# Patient Record
Sex: Male | Born: 1950 | Race: White | Hispanic: No | Marital: Married | Smoking: Never smoker
Health system: Southern US, Community
[De-identification: ages and names within clinical notes are randomized; demographics above are authoritative.]

## PROBLEM LIST (undated history)

## (undated) DIAGNOSIS — I272 Pulmonary hypertension, unspecified: Secondary | ICD-10-CM

## (undated) DIAGNOSIS — M25511 Pain in right shoulder: Secondary | ICD-10-CM

## (undated) DIAGNOSIS — I509 Heart failure, unspecified: Secondary | ICD-10-CM

## (undated) DIAGNOSIS — G4730 Sleep apnea, unspecified: Secondary | ICD-10-CM

## (undated) DIAGNOSIS — M179 Osteoarthritis of knee, unspecified: Secondary | ICD-10-CM

## (undated) DIAGNOSIS — E119 Type 2 diabetes mellitus without complications: Secondary | ICD-10-CM

## (undated) DIAGNOSIS — Z9884 Bariatric surgery status: Secondary | ICD-10-CM

## (undated) DIAGNOSIS — I639 Cerebral infarction, unspecified: Secondary | ICD-10-CM

## (undated) DIAGNOSIS — E663 Overweight: Secondary | ICD-10-CM

## (undated) DIAGNOSIS — M4800 Spinal stenosis, site unspecified: Secondary | ICD-10-CM

## (undated) DIAGNOSIS — I10 Essential (primary) hypertension: Secondary | ICD-10-CM

## (undated) DIAGNOSIS — I428 Other cardiomyopathies: Secondary | ICD-10-CM

## (undated) DIAGNOSIS — M1710 Unilateral primary osteoarthritis, unspecified knee: Secondary | ICD-10-CM

## (undated) DIAGNOSIS — E559 Vitamin D deficiency, unspecified: Secondary | ICD-10-CM

## (undated) HISTORY — PX: KNEE ARTHROSCOPY: SUR90

## (undated) HISTORY — DX: Type 2 diabetes mellitus without complications: E11.9

## (undated) HISTORY — DX: Pain in right shoulder: M25.511

## (undated) HISTORY — PX: NASAL SEPTUM SURGERY: SHX37

## (undated) HISTORY — PX: BACK SURGERY: SHX140

## (undated) HISTORY — DX: Vitamin D deficiency, unspecified: E55.9

## (undated) HISTORY — DX: Spinal stenosis, site unspecified: M48.00

## (undated) HISTORY — DX: Other cardiomyopathies: I42.8

## (undated) HISTORY — DX: Unilateral primary osteoarthritis, unspecified knee: M17.10

## (undated) HISTORY — DX: Heart failure, unspecified: I50.9

## (undated) HISTORY — DX: Osteoarthritis of knee, unspecified: M17.9

## (undated) HISTORY — DX: Bariatric surgery status: Z98.84

## (undated) HISTORY — PX: OTHER SURGICAL HISTORY: SHX169

## (undated) HISTORY — DX: Overweight: E66.3

## (undated) HISTORY — DX: Sleep apnea, unspecified: G47.30

## (undated) HISTORY — PX: CARDIAC CATHETERIZATION: SHX172

## (undated) HISTORY — DX: Pulmonary hypertension, unspecified: I27.20

---

## 1997-09-20 ENCOUNTER — Ambulatory Visit: Admission: RE | Admit: 1997-09-20 | Discharge: 1997-09-20 | Payer: Self-pay | Admitting: Internal Medicine

## 2003-08-30 ENCOUNTER — Ambulatory Visit (HOSPITAL_COMMUNITY): Admission: RE | Admit: 2003-08-30 | Discharge: 2003-08-30 | Payer: Self-pay | Admitting: Orthopedic Surgery

## 2003-08-31 ENCOUNTER — Encounter (HOSPITAL_COMMUNITY): Admission: RE | Admit: 2003-08-31 | Discharge: 2003-09-30 | Payer: Self-pay | Admitting: Orthopedic Surgery

## 2004-03-28 ENCOUNTER — Ambulatory Visit: Payer: Self-pay | Admitting: Orthopedic Surgery

## 2004-06-27 ENCOUNTER — Ambulatory Visit: Payer: Self-pay | Admitting: Orthopedic Surgery

## 2005-02-20 ENCOUNTER — Ambulatory Visit: Payer: Self-pay | Admitting: Orthopedic Surgery

## 2005-07-18 ENCOUNTER — Ambulatory Visit: Payer: Self-pay | Admitting: Orthopedic Surgery

## 2005-07-25 ENCOUNTER — Ambulatory Visit: Payer: Self-pay | Admitting: Orthopedic Surgery

## 2005-07-31 ENCOUNTER — Ambulatory Visit: Payer: Self-pay | Admitting: Orthopedic Surgery

## 2005-08-07 ENCOUNTER — Ambulatory Visit: Payer: Self-pay | Admitting: Orthopedic Surgery

## 2006-01-15 ENCOUNTER — Ambulatory Visit (HOSPITAL_COMMUNITY): Admission: RE | Admit: 2006-01-15 | Discharge: 2006-01-15 | Payer: Self-pay | Admitting: Family Medicine

## 2009-01-16 ENCOUNTER — Ambulatory Visit: Payer: Self-pay | Admitting: Orthopedic Surgery

## 2009-01-16 DIAGNOSIS — M25519 Pain in unspecified shoulder: Secondary | ICD-10-CM

## 2009-01-16 DIAGNOSIS — M25819 Other specified joint disorders, unspecified shoulder: Secondary | ICD-10-CM

## 2009-01-16 DIAGNOSIS — M25569 Pain in unspecified knee: Secondary | ICD-10-CM | POA: Insufficient documentation

## 2009-01-16 DIAGNOSIS — M7580 Other shoulder lesions, unspecified shoulder: Secondary | ICD-10-CM

## 2009-01-16 DIAGNOSIS — M6580 Other synovitis and tenosynovitis, unspecified site: Secondary | ICD-10-CM | POA: Insufficient documentation

## 2009-01-16 HISTORY — DX: Other specified joint disorders, unspecified shoulder: M25.819

## 2009-01-16 HISTORY — DX: Other synovitis and tenosynovitis, unspecified site: M65.80

## 2009-01-16 HISTORY — DX: Pain in unspecified knee: M25.569

## 2009-01-16 HISTORY — DX: Pain in unspecified shoulder: M25.519

## 2009-01-17 ENCOUNTER — Encounter (INDEPENDENT_AMBULATORY_CARE_PROVIDER_SITE_OTHER): Payer: Self-pay | Admitting: *Deleted

## 2009-01-24 ENCOUNTER — Encounter: Payer: Self-pay | Admitting: Orthopedic Surgery

## 2009-01-30 ENCOUNTER — Ambulatory Visit: Payer: Self-pay | Admitting: Orthopedic Surgery

## 2009-01-30 DIAGNOSIS — IMO0002 Reserved for concepts with insufficient information to code with codable children: Secondary | ICD-10-CM

## 2009-01-30 DIAGNOSIS — M1710 Unilateral primary osteoarthritis, unspecified knee: Secondary | ICD-10-CM

## 2009-01-30 HISTORY — DX: Unilateral primary osteoarthritis, unspecified knee: M17.10

## 2009-01-30 HISTORY — DX: Reserved for concepts with insufficient information to code with codable children: IMO0002

## 2010-02-15 ENCOUNTER — Encounter
Admission: RE | Admit: 2010-02-15 | Discharge: 2010-02-15 | Payer: Self-pay | Source: Home / Self Care | Attending: Allergy | Admitting: Allergy

## 2010-05-31 ENCOUNTER — Ambulatory Visit (INDEPENDENT_AMBULATORY_CARE_PROVIDER_SITE_OTHER): Payer: Self-pay | Admitting: Otolaryngology

## 2010-05-31 DIAGNOSIS — J320 Chronic maxillary sinusitis: Secondary | ICD-10-CM

## 2010-05-31 DIAGNOSIS — J322 Chronic ethmoidal sinusitis: Secondary | ICD-10-CM

## 2010-05-31 DIAGNOSIS — J343 Hypertrophy of nasal turbinates: Secondary | ICD-10-CM

## 2010-06-21 ENCOUNTER — Ambulatory Visit (INDEPENDENT_AMBULATORY_CARE_PROVIDER_SITE_OTHER): Payer: BC Managed Care – PPO | Admitting: Otolaryngology

## 2010-06-21 DIAGNOSIS — J320 Chronic maxillary sinusitis: Secondary | ICD-10-CM

## 2010-06-21 DIAGNOSIS — B370 Candidal stomatitis: Secondary | ICD-10-CM

## 2010-06-21 DIAGNOSIS — J343 Hypertrophy of nasal turbinates: Secondary | ICD-10-CM

## 2010-07-27 NOTE — Op Note (Signed)
NAME:  Zachary Murillo, Zachary Murillo                          ACCOUNT NO.:  [id number]   MEDICAL RECORD NO.:  [id number]                   PATIENT TYPE:  AMB   LOCATION:  DAY                                  FACILITY:  [facility]   PHYSICIAN:  Vickki Hearing, M.D.           DATE OF BIRTH:  Aug 23, 1950   DATE OF PROCEDURE:  08/30/2003  DATE OF DISCHARGE:                                 OPERATIVE REPORT   PREOPERATIVE DIAGNOSIS:  Torn medial meniscus in arthroscopy, right knee.   PROCEDURE:  1. Arthroscopy, right knee.  2. Medial meniscectomy, partial.  3. Drilling arthroplasty of medial femoral condyle.   SURGEON:  Vickki Hearing, M.D.   ANESTHESIA:  Spinal.   INDICATIONS FOR PROCEDURE:  Pain and catching with mechanical symptoms,  right knee.   OPERATIVE FINDINGS:  There was a large grade 3 lesion of the medial femoral  condyle.  There was a degenerative tear of the posterior horn of the medial  meniscus.  There was fraying of the free edge of the posterior horn on the  lateral meniscus.  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  The patellofemoral compartment had grade 2  arthritic changes.  There was significant debris in the joint from the  chondral lesion.   DETAILS OF PROCEDURE:  The patient was properly identified in the  preoperative holding area.  The correct surgical site was marked as  identified by the patient.  The patient was given Ancef and brought to the  surgical suite.  He was given an anesthetic via spinal technique and placed  supine.  Time-out was taken.  Everyone agreed with the procedure for [name], and we proceeded by prepping the knee with sterile  technique.  The knee was draped.   A two-incision technique was used.  The knee was entered via the lateral  portal.  A diagnostic arthroscopy was done.  All compartments and structures  in each compartment were visualized and probed.  The findings are  as noted  above.  The tear of the posterior horn of the medial meniscus was probed.  A  straight duckbill forcep was used to resect the torn meniscus.  A straight  incisor blade was used to balance the remaining meniscus and remove the torn  fragments.  The remaining meniscal tissue was probed and stable.   A chondroplasty was then performed on the medial femoral condyle using a  combination of a shaver and a 90-degree ArthroCare wand set at level 1.  We  then drilled approximately 6 holes in the bone to stimulate the marrow using  the chondral pick.  We then used the ArthroCare wand to trim the free edge  of the lateral meniscus which was torn, and also we used a shaver to debride  and perform chondroplasties on the patella and trochlea.  We sucked all the  debris  out of the joint.   The knee was irrigated; the portals were closed with Steri-Strips.  A  sterile dressing and CryoCuff were applied.  The patient went to the  recovery room in stable condition.  Counts were correct.  Complications  none.   PLAN:  1. Discharge today.  2. Weightbear in a brace and crutches for 6 weeks.      ___________________________________________                                            Vickki Hearing, M.D.   SEH/MEDQ  D:  08/30/2003  T:  08/31/2003  Job:  [id number]

## 2010-12-27 ENCOUNTER — Ambulatory Visit (INDEPENDENT_AMBULATORY_CARE_PROVIDER_SITE_OTHER): Payer: BC Managed Care – PPO | Admitting: Otolaryngology

## 2010-12-27 DIAGNOSIS — J310 Chronic rhinitis: Secondary | ICD-10-CM

## 2010-12-27 DIAGNOSIS — K1230 Oral mucositis (ulcerative), unspecified: Secondary | ICD-10-CM

## 2010-12-27 DIAGNOSIS — J343 Hypertrophy of nasal turbinates: Secondary | ICD-10-CM

## 2010-12-27 DIAGNOSIS — J0100 Acute maxillary sinusitis, unspecified: Secondary | ICD-10-CM

## 2011-01-24 ENCOUNTER — Ambulatory Visit (INDEPENDENT_AMBULATORY_CARE_PROVIDER_SITE_OTHER): Payer: BC Managed Care – PPO | Admitting: Otolaryngology

## 2011-08-27 ENCOUNTER — Encounter: Payer: Self-pay | Admitting: Orthopedic Surgery

## 2011-08-27 ENCOUNTER — Ambulatory Visit (INDEPENDENT_AMBULATORY_CARE_PROVIDER_SITE_OTHER): Payer: BC Managed Care – PPO

## 2011-08-27 ENCOUNTER — Ambulatory Visit (INDEPENDENT_AMBULATORY_CARE_PROVIDER_SITE_OTHER): Payer: BC Managed Care – PPO | Admitting: Orthopedic Surgery

## 2011-08-27 VITALS — BP 146/84 | Ht 71.0 in | Wt 310.0 lb

## 2011-08-27 DIAGNOSIS — M1710 Unilateral primary osteoarthritis, unspecified knee: Secondary | ICD-10-CM

## 2011-08-27 DIAGNOSIS — M25561 Pain in right knee: Secondary | ICD-10-CM

## 2011-08-27 DIAGNOSIS — M25569 Pain in unspecified knee: Secondary | ICD-10-CM

## 2011-08-27 MED ORDER — TRAMADOL-ACETAMINOPHEN 37.5-325 MG PO TABS: 1.0000 | ORAL_TABLET | ORAL | Status: AC | PRN

## 2011-08-27 MED ORDER — NABUMETONE 500 MG PO TABS: 500.0000 mg | ORAL_TABLET | Freq: Two times a day (BID) | ORAL | Status: AC

## 2011-08-27 NOTE — Patient Instructions (Addendum)
You have received a steroid shot. 15% of patients experience increased pain at the injection site with in the next 24 hours. This is best treated with ice and tylenol extra strength 2 tabs every 8 hours. If you are still having pain please call the office.   WE WILL CALL YOU WHEN THE HYALGAN HAS BEEN APPROVED

## 2011-09-05 DIAGNOSIS — M25561 Pain in right knee: Secondary | ICD-10-CM | POA: Insufficient documentation

## 2011-09-05 DIAGNOSIS — M1710 Unilateral primary osteoarthritis, unspecified knee: Secondary | ICD-10-CM | POA: Insufficient documentation

## 2011-09-05 HISTORY — DX: Pain in right knee: M25.561

## 2011-09-05 NOTE — Progress Notes (Signed)
  Subjective:    Patient ID: Zachary Murillo, male    DOB: 1950/07/28, 61 y.o.   MRN: [id number]  Knee Pain  Incident onset: History of chronic knee pain previous aspirations injections and arthroscopy. There was no injury mechanism. The pain is present in the right knee. The quality of the pain is described as aching and stabbing. The pain is moderate. The pain has been worsening since onset. Associated symptoms include a loss of motion. Pertinent negatives include no inability to bear weight, loss of sensation, muscle weakness, numbness or tingling.      Review of Systems  Musculoskeletal: Positive for joint swelling and arthralgias.  Neurological: Negative for tingling, weakness and numbness.       Objective:   Physical Exam  Constitutional: He is oriented to person, place, and time. He appears well-developed and well-nourished.  HENT:  Head: Normocephalic.  Neck: Neck supple.  Cardiovascular: Intact distal pulses.   Pulmonary/Chest: Effort normal.  Abdominal: He exhibits no distension.  Musculoskeletal:       Right shoulder: Normal.       Left shoulder: Normal.       Right hip: Normal.       Left hip: Normal.       Right knee: He exhibits decreased range of motion, swelling, effusion, deformity, abnormal alignment, bony tenderness and abnormal meniscus. He exhibits no ecchymosis, no laceration, no erythema, no LCL laxity, normal patellar mobility and no MCL laxity. tenderness found. Medial joint line tenderness noted. No lateral joint line, no MCL, no LCL and no patellar tendon tenderness noted.       Left knee: Normal.  Lymphadenopathy:       Right: No inguinal adenopathy present.       Left: No inguinal adenopathy present.  Neurological: He is alert and oriented to person, place, and time. He has normal reflexes.  Skin: Skin is warm and dry.  Psychiatric: He has a normal mood and affect. His behavior is normal. Judgment and thought content normal.    X-ray was obtained  there is progressive narrowing of the medial joint space with osteophytes and subchondral sclerosis and cyst formation and varus deformity      Assessment & Plan:  Osteoarthritis right knee progressive  Recommend aspiration for fusion with injection and then consider hyaluronic acid  Aspiration RIGHT knee Verbal consent a this was followed by the injection of 40 mg of Depo-Medrol and 3 cc 1% lidocaine.nd timeout were completed for a RIGHT knee aspiration  Under sterile conditions the RIGHT knee was aspirated from a lateral approach.   Findings were clear yellow fluid 60cc  A sterile dressing was applied there were no complications

## 2011-09-11 ENCOUNTER — Ambulatory Visit (INDEPENDENT_AMBULATORY_CARE_PROVIDER_SITE_OTHER): Payer: BC Managed Care – PPO | Admitting: Orthopedic Surgery

## 2011-09-11 ENCOUNTER — Encounter: Payer: Self-pay | Admitting: Orthopedic Surgery

## 2011-09-11 VITALS — BP 176/100 | Ht 71.0 in | Wt 310.0 lb

## 2011-09-11 DIAGNOSIS — M1710 Unilateral primary osteoarthritis, unspecified knee: Secondary | ICD-10-CM

## 2011-09-11 NOTE — Progress Notes (Signed)
Patient ID: Zachary Murillo, male   DOB: May 30, 1950, 61 y.o.   MRN: [id number] Chief Complaint  Patient presents with  . Injections    Orthovisc #1 Right knee    BP 176/100  Ht 5\' 11"  (1.803 m)  Wt 310 lb (140.615 kg)  BMI 43.24 kg/m2  ORTHOVISC INJECTION PROCEDURE   DX OA RIGHT KNEE   TIME OUT   VERBAL CONSENT   ALCOHOL PREP   ETHYL CHLORIDE ANESTHESIA   LATERAL APPROACH : INJECTED 1 VIAL OF ORTHOVISC   NO COMPLICATIONS

## 2011-09-11 NOTE — Patient Instructions (Signed)
You have received a steroid shot. 15% of patients experience increased pain at the injection site with in the next 24 hours. This is best treated with ice and tylenol extra strength 2 tabs every 8 hours. If you are still having pain please call the office.    

## 2011-09-18 ENCOUNTER — Encounter: Payer: Self-pay | Admitting: Orthopedic Surgery

## 2011-09-18 ENCOUNTER — Ambulatory Visit (INDEPENDENT_AMBULATORY_CARE_PROVIDER_SITE_OTHER): Payer: BC Managed Care – PPO | Admitting: Orthopedic Surgery

## 2011-09-18 VITALS — BP 144/90 | Ht 71.0 in | Wt 310.0 lb

## 2011-09-18 DIAGNOSIS — M1710 Unilateral primary osteoarthritis, unspecified knee: Secondary | ICD-10-CM

## 2011-09-18 NOTE — Patient Instructions (Addendum)
You have received a steroid shot. 15% of patients experience increased pain at the injection site with in the next 24 hours. This is best treated with ice and tylenol extra strength 2 tabs every 8 hours. If you are still having pain please call the office.    

## 2011-09-18 NOTE — Progress Notes (Signed)
Patient ID: Zachary Murillo, male   DOB: 15-Oct-1950, 61 y.o.   MRN: [id number] Chief Complaint  Patient presents with  . Injections    2nd orthovisc injection right knee    BP 144/90  Ht 5\' 11"  (1.803 m)  Wt 310 lb (140.615 kg)  BMI 43.24 kg/m2  Orthovisc injection  Preop diagnosis osteoarthritis right  knee  Postoperative diagnosis same  Procedure Orthovisc injection  Verbal consent was obtained. Timeout was completed.  The knee was prepped with alcohol and ethyl chloride. A 20-gauge needle was used to inject 1 bile of Orthovisc into the joint via lateral approach  There were no complications the wound was sterilely dressed.

## 2011-09-25 ENCOUNTER — Encounter: Payer: Self-pay | Admitting: Orthopedic Surgery

## 2011-09-25 ENCOUNTER — Ambulatory Visit (INDEPENDENT_AMBULATORY_CARE_PROVIDER_SITE_OTHER): Payer: BC Managed Care – PPO | Admitting: Orthopedic Surgery

## 2011-09-25 VITALS — Ht 71.0 in | Wt 310.0 lb

## 2011-09-25 DIAGNOSIS — M1710 Unilateral primary osteoarthritis, unspecified knee: Secondary | ICD-10-CM

## 2011-09-25 DIAGNOSIS — M179 Osteoarthritis of knee, unspecified: Secondary | ICD-10-CM

## 2011-09-25 NOTE — Progress Notes (Signed)
Patient ID: Zachary Murillo, male   DOB: 11/27/1950, 61 y.o.   MRN: [id number] Chief Complaint  Patient presents with  . Follow-up    ! week recheck on right knee with Orthovisc injection.    ORTHOVISC INJECTION PROCEDURE   DX OA RIGHT KNEE   TIME OUT   VERBAL CONSENT   ALCOHOL PREP   ETHYL CHLORIDE ANESTHESIA   LATERAL APPROACH : INJECTED 1 VIAL OF ORTHOVISC   NO COMPLICATIONS

## 2011-09-25 NOTE — Patient Instructions (Addendum)
You have received a steroid shot. 15% of patients experience increased pain at the injection site with in the next 24 hours. This is best treated with ice and tylenol extra strength 2 tabs every 8 hours. If you are still having pain please call the office.    

## 2012-04-13 ENCOUNTER — Ambulatory Visit (INDEPENDENT_AMBULATORY_CARE_PROVIDER_SITE_OTHER): Payer: BC Managed Care – PPO | Admitting: Orthopedic Surgery

## 2012-04-13 ENCOUNTER — Ambulatory Visit (INDEPENDENT_AMBULATORY_CARE_PROVIDER_SITE_OTHER): Payer: BC Managed Care – PPO

## 2012-04-13 ENCOUNTER — Encounter: Payer: Self-pay | Admitting: Orthopedic Surgery

## 2012-04-13 VITALS — BP 220/102 | Ht 71.0 in | Wt 310.0 lb

## 2012-04-13 DIAGNOSIS — M179 Osteoarthritis of knee, unspecified: Secondary | ICD-10-CM

## 2012-04-13 DIAGNOSIS — M1710 Unilateral primary osteoarthritis, unspecified knee: Secondary | ICD-10-CM

## 2012-04-13 DIAGNOSIS — Z96659 Presence of unspecified artificial knee joint: Secondary | ICD-10-CM

## 2012-04-13 MED ORDER — TRAMADOL-ACETAMINOPHEN 37.5-325 MG PO TABS: 1.0000 | ORAL_TABLET | Freq: Four times a day (QID) | ORAL | Status: DC | PRN

## 2012-04-13 NOTE — Addendum Note (Signed)
Addended by: Vickki Hearing on: 04/13/2012 05:24 PM   Modules accepted: Level of Service

## 2012-04-13 NOTE — Progress Notes (Signed)
Patient ID: Zachary Murillo, male   DOB: 1950/09/11, 62 y.o.   MRN: [id number] Chief Complaint  Patient presents with  . Knee Pain    Right knee pain no injury    BP 220/102  Ht 5\' 11"  (1.803 m)  Wt 310 lb (140.615 kg)  BMI 43.24 kg/m2  History of chronic osteoarthritis status post arthroscopy of the knee several years ago currently returning from a trip July for 2 weeks and during that time he did quite a bit of walking or she was in the airplane he came back to the cold weather his knee started aching really badly worse than before. Has medial joint line pain extension the knee some giving out symptoms which are becoming more prominent however he would not or is not ready for knee replacement surgery we'll see what else we can do we've tried some anti-inflammatories and injections in the past not on any medication currently  His blood pressure was up to date: Get his blood pressure medicine he's been out of it since Saturday  Review of systems he denies numbness or tingling in his lower extremities he does have some joint pain. No skin changes no vascular changes of any note  BP 220/102  Ht 5\' 11"  (1.803 m)  Wt 310 lb (140.615 kg)  BMI 43.24 kg/m2 Physical Exam(12)  Vital signs:   GENERAL: normal development   CDV: pulses are normal   Skin: normal  Lymph: nodes were not palpable/normal  Psychiatric: awake, alert and oriented  Neuro: normal sensation  MSK  Gait: He is a plate with an altered gait favoring his right lower extremity. He has medial joint line tenderness varus alignment to his knee knee flexion is 120 motor exam is normal his knee is stable. His upper extremities show no contracture subluxation atrophy tremor or malalignment   Imaging today's x-ray compared to x-ray done 6 months ago shows that he does have indeed progressive osteoarthritis with further narrowing of the medial joint space consistent with degenerative joint disease  Assessment: Progressive  osteoarthritis    Plan: Continue nonoperative management  Were discussed knee replacement options including a brochure and a model in a description of the surgery  We injected his right knee we started him on Ultracet see him in 3 months if he is any better  Knee  Injection Procedure Note  Pre-operative Diagnosis: right knee oa  Post-operative Diagnosis: same  Indications: pain  Anesthesia: ethyl chloride   Procedure Details   Verbal consent was obtained for the procedure. Time out was completed.The joint was prepped with alcohol, followed by  Ethyl chloride spray and A 20 gauge needle was inserted into the knee via lateral approach; 4ml 1% lidocaine and 1 ml of depomedrol  was then injected into the joint . The needle was removed and the area cleansed and dressed.  Complications:  None; patient tolerated the procedure well.

## 2012-04-13 NOTE — Progress Notes (Addendum)
Patient ID: Zachary Murillo, male   DOB: [birth year], 62 y.o.   MRN: [id number]   BP 220/102  Ht 5\' 11"  (1.803 m)  Wt 310 lb (140.615 kg)  BMI 43.24 kg/m2

## 2012-04-13 NOTE — Patient Instructions (Addendum)
You have received a steroid shot. 15% of patients experience increased pain at the injection site with in the next 24 hours. This is best treated with ice and tylenol extra strength 2 tabs every 8 hours. If you are still having pain please call the office.    Start ultracet 1 q 6 hrs for pain

## 2012-07-14 ENCOUNTER — Encounter: Payer: Self-pay | Admitting: Orthopedic Surgery

## 2012-07-14 ENCOUNTER — Ambulatory Visit: Payer: BC Managed Care – PPO | Admitting: Orthopedic Surgery

## 2012-12-03 DIAGNOSIS — I453 Trifascicular block: Secondary | ICD-10-CM | POA: Insufficient documentation

## 2012-12-03 DIAGNOSIS — I152 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: Secondary | ICD-10-CM | POA: Insufficient documentation

## 2012-12-22 DIAGNOSIS — G4730 Sleep apnea, unspecified: Secondary | ICD-10-CM | POA: Insufficient documentation

## 2012-12-24 DIAGNOSIS — R931 Abnormal findings on diagnostic imaging of heart and coronary circulation: Secondary | ICD-10-CM | POA: Insufficient documentation

## 2012-12-25 DIAGNOSIS — I5042 Chronic combined systolic (congestive) and diastolic (congestive) heart failure: Secondary | ICD-10-CM | POA: Insufficient documentation

## 2013-01-18 ENCOUNTER — Inpatient Hospital Stay (HOSPITAL_COMMUNITY)
Admission: AD | Admit: 2013-01-18 | Discharge: 2013-01-20 | DRG: 065 | Disposition: A | Discharge status: Home Health | Payer: BC Managed Care – PPO | Source: Ambulatory Visit | Attending: Pulmonary Disease | Admitting: Pulmonary Disease

## 2013-01-18 ENCOUNTER — Other Ambulatory Visit (HOSPITAL_COMMUNITY): Payer: Self-pay | Admitting: Pulmonary Disease

## 2013-01-18 ENCOUNTER — Ambulatory Visit (HOSPITAL_COMMUNITY)
Admission: RE | Admit: 2013-01-18 | Discharge: 2013-01-18 | Disposition: A | Discharge status: Home / Self Care | Payer: BC Managed Care – PPO | Source: Ambulatory Visit | Attending: Pulmonary Disease | Admitting: Pulmonary Disease

## 2013-01-18 ENCOUNTER — Encounter (HOSPITAL_COMMUNITY): Payer: Self-pay

## 2013-01-18 DIAGNOSIS — Z6841 Body Mass Index (BMI) 40.0 and over, adult: Secondary | ICD-10-CM

## 2013-01-18 DIAGNOSIS — I6350 Cerebral infarction due to unspecified occlusion or stenosis of unspecified cerebral artery: Principal | ICD-10-CM | POA: Diagnosis present

## 2013-01-18 DIAGNOSIS — E785 Hyperlipidemia, unspecified: Secondary | ICD-10-CM | POA: Diagnosis present

## 2013-01-18 DIAGNOSIS — Z7982 Long term (current) use of aspirin: Secondary | ICD-10-CM

## 2013-01-18 DIAGNOSIS — I10 Essential (primary) hypertension: Secondary | ICD-10-CM | POA: Diagnosis present

## 2013-01-18 DIAGNOSIS — M159 Polyosteoarthritis, unspecified: Secondary | ICD-10-CM | POA: Diagnosis present

## 2013-01-18 DIAGNOSIS — Z794 Long term (current) use of insulin: Secondary | ICD-10-CM

## 2013-01-18 DIAGNOSIS — F3289 Other specified depressive episodes: Secondary | ICD-10-CM | POA: Diagnosis present

## 2013-01-18 DIAGNOSIS — R531 Weakness: Secondary | ICD-10-CM

## 2013-01-18 DIAGNOSIS — F329 Major depressive disorder, single episode, unspecified: Secondary | ICD-10-CM | POA: Diagnosis present

## 2013-01-18 DIAGNOSIS — Z833 Family history of diabetes mellitus: Secondary | ICD-10-CM

## 2013-01-18 DIAGNOSIS — G4730 Sleep apnea, unspecified: Secondary | ICD-10-CM | POA: Diagnosis present

## 2013-01-18 DIAGNOSIS — Z8249 Family history of ischemic heart disease and other diseases of the circulatory system: Secondary | ICD-10-CM

## 2013-01-18 DIAGNOSIS — E119 Type 2 diabetes mellitus without complications: Secondary | ICD-10-CM | POA: Diagnosis present

## 2013-01-18 DIAGNOSIS — Z825 Family history of asthma and other chronic lower respiratory diseases: Secondary | ICD-10-CM

## 2013-01-18 HISTORY — DX: Essential (primary) hypertension: I10

## 2013-01-18 LAB — COMPREHENSIVE METABOLIC PANEL
Alkaline Phosphatase: 108 U/L (ref 39–117)
BUN: 26 mg/dL — ABNORMAL HIGH (ref 6–23)
CO2: 31 mEq/L (ref 19–32)
Calcium: 9.6 mg/dL (ref 8.4–10.5)
Creatinine, Ser: 1.01 mg/dL (ref 0.50–1.35)
GFR calc Af Amer: 89 mL/min — ABNORMAL LOW (ref >90)
GFR calc non Af Amer: 77 mL/min — ABNORMAL LOW (ref >90)
Glucose, Bld: 139 mg/dL — ABNORMAL HIGH (ref 70–99)
Potassium: 3.3 mEq/L — ABNORMAL LOW (ref 3.5–5.1)
Total Protein: 7.3 g/dL (ref 6.0–8.3)

## 2013-01-18 LAB — CBC WITH DIFFERENTIAL/PLATELET
Basophils Absolute: 0 10*3/uL (ref 0.0–0.1)
Eosinophils Absolute: 0.8 10*3/uL — ABNORMAL HIGH (ref 0.0–0.7)
HCT: 41.9 % (ref 39.0–52.0)
Hemoglobin: 14.2 g/dL (ref 13.0–17.0)
Lymphocytes Relative: 19 % (ref 12–46)
Lymphs Abs: 2.2 10*3/uL (ref 0.7–4.0)
MCH: 28.9 pg (ref 26.0–34.0)
Monocytes Absolute: 0.8 10*3/uL (ref 0.1–1.0)
Neutro Abs: 7.6 10*3/uL (ref 1.7–7.7)
RBC: 4.91 MIL/uL (ref 4.22–5.81)
RDW: 14 % (ref 11.5–15.5)
WBC: 11.5 10*3/uL — ABNORMAL HIGH (ref 4.0–10.5)

## 2013-01-18 LAB — PROTIME-INR: Prothrombin Time: 12.6 seconds (ref 11.6–15.2)

## 2013-01-18 LAB — GLUCOSE, CAPILLARY

## 2013-01-18 MED ORDER — ENOXAPARIN SODIUM 40 MG/0.4ML ~~LOC~~ SOLN
40.0000 mg | SUBCUTANEOUS | Status: DC
  Administered 2013-01-18 – 2013-01-19 (×2): 40 mg via SUBCUTANEOUS
  Filled 2013-01-18 (×2): qty 0.4

## 2013-01-18 MED ORDER — CLONIDINE HCL 0.2 MG PO TABS
0.2000 mg | ORAL_TABLET | Freq: Three times a day (TID) | ORAL | Status: DC
  Administered 2013-01-18 – 2013-01-20 (×5): 0.2 mg via ORAL
  Filled 2013-01-18 (×5): qty 1

## 2013-01-18 MED ORDER — PANTOPRAZOLE SODIUM 40 MG PO TBEC
40.0000 mg | DELAYED_RELEASE_TABLET | Freq: Every day | ORAL | Status: DC
  Administered 2013-01-19 – 2013-01-20 (×2): 40 mg via ORAL
  Filled 2013-01-18 (×2): qty 1

## 2013-01-18 MED ORDER — MINOXIDIL 10 MG PO TABS
10.0000 mg | ORAL_TABLET | Freq: Every morning | ORAL | Status: DC
  Administered 2013-01-19 – 2013-01-20 (×2): 10 mg via ORAL
  Filled 2013-01-18 (×4): qty 1

## 2013-01-18 MED ORDER — CANAGLIFLOZIN 300 MG PO TABS
300.0000 mg | ORAL_TABLET | Freq: Every day | ORAL | Status: DC
  Administered 2013-01-19 – 2013-01-20 (×2): 300 mg via ORAL
  Filled 2013-01-18 (×2): qty 1

## 2013-01-18 MED ORDER — SODIUM CHLORIDE 0.9 % IJ SOLN
3.0000 mL | Freq: Two times a day (BID) | INTRAMUSCULAR | Status: DC
  Administered 2013-01-18 – 2013-01-20 (×4): 3 mL via INTRAVENOUS

## 2013-01-18 MED ORDER — SALINE SPRAY 0.65 % NA SOLN
1.0000 | NASAL | Status: DC | PRN
  Filled 2013-01-18: qty 44

## 2013-01-18 MED ORDER — CARVEDILOL 12.5 MG PO TABS
25.0000 mg | ORAL_TABLET | Freq: Two times a day (BID) | ORAL | Status: DC
  Administered 2013-01-19 – 2013-01-20 (×3): 25 mg via ORAL
  Filled 2013-01-18 (×3): qty 2

## 2013-01-18 MED ORDER — TRIAMTERENE-HCTZ 75-50 MG PO TABS
1.0000 | ORAL_TABLET | Freq: Every morning | ORAL | Status: DC
  Administered 2013-01-19 – 2013-01-20 (×2): 1 via ORAL
  Filled 2013-01-18 (×2): qty 1

## 2013-01-18 MED ORDER — ASPIRIN EC 81 MG PO TBEC
81.0000 mg | DELAYED_RELEASE_TABLET | Freq: Every morning | ORAL | Status: DC
  Administered 2013-01-19 – 2013-01-20 (×2): 81 mg via ORAL
  Filled 2013-01-18 (×2): qty 1

## 2013-01-18 MED ORDER — ACETAMINOPHEN 325 MG PO TABS: 650.0000 mg | ORAL_TABLET | Freq: Four times a day (QID) | ORAL | Status: DC | PRN

## 2013-01-18 MED ORDER — VITAMIN C 500 MG PO TABS
1000.0000 mg | ORAL_TABLET | Freq: Every morning | ORAL | Status: DC
  Administered 2013-01-19 – 2013-01-20 (×2): 1000 mg via ORAL
  Filled 2013-01-18 (×2): qty 2

## 2013-01-18 MED ORDER — ALUM & MAG HYDROXIDE-SIMETH 200-200-20 MG/5ML PO SUSP: 30.0000 mL | Freq: Four times a day (QID) | ORAL | Status: DC | PRN

## 2013-01-18 MED ORDER — DM-GUAIFENESIN ER 30-600 MG PO TB12
1.0000 | ORAL_TABLET | Freq: Two times a day (BID) | ORAL | Status: DC
  Administered 2013-01-18 – 2013-01-20 (×4): 1 via ORAL
  Filled 2013-01-18 (×4): qty 1

## 2013-01-18 MED ORDER — POTASSIUM CHLORIDE CRYS ER 20 MEQ PO TBCR
20.0000 meq | EXTENDED_RELEASE_TABLET | Freq: Three times a day (TID) | ORAL | Status: DC
  Administered 2013-01-18: 20 meq via ORAL
  Filled 2013-01-18: qty 1

## 2013-01-18 MED ORDER — ONDANSETRON HCL 4 MG/2ML IJ SOLN: 4.0000 mg | Freq: Four times a day (QID) | INTRAMUSCULAR | Status: DC | PRN

## 2013-01-18 MED ORDER — VITAMIN B-12 1000 MCG PO TABS
1000.0000 ug | ORAL_TABLET | Freq: Every morning | ORAL | Status: DC
  Administered 2013-01-19 – 2013-01-20 (×2): 1000 ug via ORAL
  Filled 2013-01-18 (×2): qty 1

## 2013-01-18 MED ORDER — SODIUM CHLORIDE 0.9 % IV SOLN: 250.0000 mL | INTRAVENOUS | Status: DC | PRN

## 2013-01-18 MED ORDER — HYDROCODONE-ACETAMINOPHEN 5-325 MG PO TABS: 1.0000 | ORAL_TABLET | ORAL | Status: DC | PRN

## 2013-01-18 MED ORDER — INSULIN DETEMIR 100 UNIT/ML ~~LOC~~ SOLN
10.0000 [IU] | Freq: Every day | SUBCUTANEOUS | Status: DC
  Administered 2013-01-18 – 2013-01-19 (×2): 10 [IU] via SUBCUTANEOUS
  Filled 2013-01-18 (×3): qty 0.1

## 2013-01-18 MED ORDER — ATORVASTATIN CALCIUM 20 MG PO TABS
20.0000 mg | ORAL_TABLET | Freq: Every day | ORAL | Status: DC
  Administered 2013-01-18 – 2013-01-19 (×2): 20 mg via ORAL
  Filled 2013-01-18 (×2): qty 1

## 2013-01-18 MED ORDER — AMLODIPINE BESYLATE 5 MG PO TABS
10.0000 mg | ORAL_TABLET | Freq: Every morning | ORAL | Status: DC
  Administered 2013-01-19 – 2013-01-20 (×2): 10 mg via ORAL
  Filled 2013-01-18 (×2): qty 2

## 2013-01-18 MED ORDER — COLESEVELAM HCL 625 MG PO TABS
1875.0000 mg | ORAL_TABLET | Freq: Three times a day (TID) | ORAL | Status: DC
  Administered 2013-01-18 – 2013-01-20 (×5): 1875 mg via ORAL
  Filled 2013-01-18 (×11): qty 3

## 2013-01-18 MED ORDER — METFORMIN HCL 500 MG PO TABS
1000.0000 mg | ORAL_TABLET | Freq: Two times a day (BID) | ORAL | Status: DC
  Administered 2013-01-18 – 2013-01-20 (×4): 1000 mg via ORAL
  Filled 2013-01-18 (×4): qty 2

## 2013-01-18 MED ORDER — LORATADINE 10 MG PO TABS
10.0000 mg | ORAL_TABLET | Freq: Every day | ORAL | Status: DC
  Administered 2013-01-19 – 2013-01-20 (×2): 10 mg via ORAL
  Filled 2013-01-18 (×2): qty 1

## 2013-01-18 MED ORDER — LIRAGLUTIDE 18 MG/3ML ~~LOC~~ SOPN
1.2000 ug | PEN_INJECTOR | Freq: Every day | SUBCUTANEOUS | Status: DC
  Administered 2013-01-18 – 2013-01-19 (×2): 1.2 ug via SUBCUTANEOUS
  Filled 2013-01-18 (×9): qty 3

## 2013-01-18 MED ORDER — ACETAMINOPHEN 650 MG RE SUPP: 650.0000 mg | Freq: Four times a day (QID) | RECTAL | Status: DC | PRN

## 2013-01-18 MED ORDER — TERAZOSIN HCL 5 MG PO CAPS
10.0000 mg | ORAL_CAPSULE | Freq: Every day | ORAL | Status: DC
  Administered 2013-01-18 – 2013-01-19 (×2): 10 mg via ORAL
  Filled 2013-01-18 (×2): qty 2

## 2013-01-18 MED ORDER — LISINOPRIL 10 MG PO TABS
40.0000 mg | ORAL_TABLET | Freq: Every morning | ORAL | Status: DC
  Administered 2013-01-19 – 2013-01-20 (×2): 40 mg via ORAL
  Filled 2013-01-18 (×2): qty 4

## 2013-01-18 MED ORDER — SODIUM CHLORIDE 0.9 % IJ SOLN: 3.0000 mL | INTRAMUSCULAR | Status: DC | PRN

## 2013-01-18 MED ORDER — ALPRAZOLAM 1 MG PO TABS: 1.0000 mg | ORAL_TABLET | Freq: Once | ORAL | Status: AC

## 2013-01-18 MED ORDER — TRAZODONE HCL 50 MG PO TABS
25.0000 mg | ORAL_TABLET | Freq: Every evening | ORAL | Status: DC | PRN
  Administered 2013-01-18: 25 mg via ORAL
  Filled 2013-01-18: qty 1

## 2013-01-18 MED ORDER — ONDANSETRON HCL 4 MG PO TABS: 4.0000 mg | ORAL_TABLET | Freq: Four times a day (QID) | ORAL | Status: DC | PRN

## 2013-01-19 ENCOUNTER — Inpatient Hospital Stay (HOSPITAL_COMMUNITY): Payer: BC Managed Care – PPO

## 2013-01-19 LAB — GLUCOSE, CAPILLARY: Glucose-Capillary: 151 mg/dL — ABNORMAL HIGH (ref 70–99)

## 2013-01-19 LAB — HEMOGLOBIN A1C
Hgb A1c MFr Bld: 7.3 % — ABNORMAL HIGH (ref <5.7)
Mean Plasma Glucose: 163 mg/dL — ABNORMAL HIGH (ref <117)

## 2013-01-19 MED ORDER — POTASSIUM CHLORIDE CRYS ER 20 MEQ PO TBCR
20.0000 meq | EXTENDED_RELEASE_TABLET | Freq: Four times a day (QID) | ORAL | Status: DC
  Administered 2013-01-19 – 2013-01-20 (×6): 20 meq via ORAL
  Filled 2013-01-19 (×5): qty 1
  Filled 2013-01-19: qty 2

## 2013-01-19 MED ORDER — ALPRAZOLAM 1 MG PO TABS
1.0000 mg | ORAL_TABLET | Freq: Once | ORAL | Status: AC
  Administered 2013-01-19: 1 mg via ORAL
  Filled 2013-01-19: qty 1

## 2013-01-19 NOTE — H&P (Signed)
NAME:  BRAXLEY, Zachary Murillo                ACCOUNT NO.:  [id number]  MEDICAL RECORD NO.:  [id number]  LOCATION:  A341                          FACILITY:  [facility]  PHYSICIAN:  Jozette Castrellon L. Juanetta Gosling, M.D.DATE OF BIRTH:  03-08-1951  DATE OF ADMISSION:  01/18/2013 DATE OF DISCHARGE:  LH                             HISTORY & PHYSICAL   REASON FOR ADMISSION:  Probable stroke.  HISTORY:  This is a 62 year old, who has multiple medical problems.  He came to my office on the day of admission with complaints of right side weakness.  He did not have any slurred speech.  He did not have any trouble swallowing.  He says his entire right side feels heavy.  He had a CT done and that shows 2 areas that could indicate a previous stroke. He is being evaluated for potential surgery for obesity, and during his evaluation, he has been found to have severe problems with hypertension. He was found to have a decreased ejection fraction by echocardiogram and he had a recent cardiac catheterization that did not show coronary artery occlusive disease.  He has a history of diabetes, sleep apnea, obesity, hypertension, decreased cardiac ejection fraction.  FAMILY HISTORY:  He had a brother who died suddenly of heart disease. There is apparently family history of diabetes, arthritis, and cancer.  SOCIAL HISTORY:  He is retired.  He does not smoke and has never smoked. He does use occasional alcohol.  Does not use any illicit drugs.  He is married and lives at home with his wife.  Medication list is not complete at this point because he has had a number of medication changes.  REVIEW OF SYSTEMS:  He has had some swelling of his legs.  He denies any chest pain.  He has not had any swallowing problems.  He denies any problems with speech.  PHYSICAL EXAMINATION:  VITAL SIGNS:  His blood pressure is 180/79, pulse 82, temperature is 98.3, height 5 feet 10 inches, O2 sat 93%, and weight is pending. HEENT:  His face is  symmetrical.  He does not have any bruits. CHEST:  Clear. HEART:  Regular. ABDOMEN:  Soft, obese, without masses. EXTREMITIES:  Showed no edema.  He has some weakness of his grip strength on the right.  He has some weakness in his right leg and right arm. CENTRAL NERVOUS SYSTEM:  As above.  ASSESSMENT:  I am concerned that he has had a stroke.  PLAN:  He will remain on his current medications.  He will have neuro checks.  He will have neurological consultation.  He will have an MRI.     Juhi Lagrange L. Juanetta Gosling, M.D.     ELH/MEDQ  D:  01/18/2013  T:  01/19/2013  Job:  [id number]

## 2013-01-19 NOTE — Care Management Note (Signed)
    Page 1 of 1   01/20/2013     1:10:13 PM   CARE MANAGEMENT NOTE 01/20/2013  Patient:  Zachary Murillo, Zachary Murillo   Account Number:  [id number]  Date Initiated:  01/19/2013  Documentation initiated by:  Rosemary Holms  Subjective/Objective Assessment:   Pt from home with his wife. Will follow for [facility] needs     Action/Plan:   Anticipated DC Date:  01/20/2013   Anticipated DC Plan:  HOME/SELF CARE      DC Planning Services  CM consult      Choice offered to / List presented to:     DME arranged  [facility]      DME agency  [facility]        Status of service:  Completed, signed off Medicare Important Message given?   (If response is "NO", the following Medicare IM given date fields will be blank) Date Medicare IM given:   Date Additional Medicare IM given:    Discharge Disposition:    Per UR Regulation:    If discussed at Long Length of Stay Meetings, dates discussed:    Comments:  01/20/13 Rosemary Holms RN BSN CM Per Dr. Juanetta Gosling request, CM contacted Jeronimo Norma at [facility] for inpt rehab. CIR can not accept pt because he is too high functioning. PT eval suggested Outpt Rehab. Dr. Juanetta Gosling arranging outpt PT and OT. DME set up with [facility]   01/19/13 Rosemary Holms RN BSN CM

## 2013-01-19 NOTE — Consult Note (Signed)
[facility] Zachary Murillo A. Zachary Pilgrim, MD     [email or web address]          Zachary Murillo is an 62 y.o. male.   ASSESSMENT/PLAN: Acute lac unfartcion  ASA/PLAVIX X 3 MONTHS, THEN ASA; STATIN, PT    See dictated note   Past Medical History  Diagnosis Date  . Diabetes mellitus   . Sleep apnea   . Overweight(278.02)   . Hypertension     Past Surgical History  Procedure Laterality Date  . Knee arthroscopy    . Nasal septum surgery    . Cardiac catheterization      Family History  Problem Relation Age of Onset  . Heart disease    . Arthritis    . Cancer    . Asthma    . Diabetes      Social History:  reports that he has never smoked. He does not have any smokeless tobacco history on file. He reports that he drinks alcohol. He reports that he does not use illicit drugs.  Allergies: No Known Allergies  Medications: Prior to Admission medications   Medication Sig Start Date End Date Taking? Authorizing Provider  acetaminophen (TYLENOL) 500 MG tablet Take 1,500 mg by mouth daily as needed for mild pain, moderate pain or headache.   Yes Historical Provider, MD  amLODipine (NORVASC) 10 MG tablet Take 10 mg by mouth every morning.   Yes Historical Provider, MD  Ascorbic Acid (VITAMIN C) 1000 MG tablet Take 1,000 mg by mouth every morning.   Yes Historical Provider, MD  aspirin EC 81 MG tablet Take 81 mg by mouth every morning.   Yes Historical Provider, MD  atorvastatin (LIPITOR) 20 MG tablet Take 20 mg by mouth at bedtime.   Yes Historical Provider, MD  Canagliflozin (INVOKANA) 300 MG TABS Take 300 mg by mouth every morning.   Yes Historical Provider, MD  carvedilol (COREG) 25 MG tablet Take 25 mg by mouth 2 (two) times daily with a meal.   Yes Historical Provider, MD  cetirizine (ZYRTEC) 10 MG tablet Take 10 mg by mouth daily.   Yes Historical Provider, MD  cloNIDine (CATAPRES) 0.2 MG tablet Take 0.2 mg by mouth 3 (three) times daily.   Yes Historical Provider, MD    colesevelam (WELCHOL) 625 MG tablet Take 1,875 mg by mouth 3 (three) times daily.   Yes Historical Provider, MD  dextromethorphan-guaiFENesin (MUCINEX DM) 30-600 MG per 12 hr tablet Take 1 tablet by mouth 2 (two) times daily.   Yes Historical Provider, MD  Insulin Detemir (LEVEMIR FLEXPEN) 100 UNIT/ML SOPN Inject 10 Units into the skin at bedtime.   Yes Historical Provider, MD  lansoprazole (PREVACID) 30 MG capsule Take 30 mg by mouth 2 (two) times daily.   Yes Historical Provider, MD  Liraglutide (VICTOZA) 18 MG/3ML SOPN Inject 1.2 mcg into the skin at bedtime.   Yes Historical Provider, MD  lisinopril (PRINIVIL,ZESTRIL) 40 MG tablet Take 40 mg by mouth every morning.   Yes Historical Provider, MD  metFORMIN (GLUCOPHAGE) 1000 MG tablet Take 1,000 mg by mouth 2 (two) times daily with a meal.   Yes Historical Provider, MD  minoxidil (LONITEN) 10 MG tablet Take 10 mg by mouth every morning.   Yes Historical Provider, MD  Multiple Vitamins-Minerals (CENTRUM PO) Take 1 tablet by mouth every morning.   Yes Historical Provider, MD  potassium chloride SA (K-DUR,KLOR-CON) 20 MEQ tablet Take 20 mEq by mouth 3 (three) times daily.   Yes Historical Provider,  MD  sodium chloride (OCEAN) 0.65 % nasal spray Place 1 spray into the nose as needed for congestion.   Yes Historical Provider, MD  terazosin (HYTRIN) 10 MG capsule Take 10 mg by mouth at bedtime.   Yes Historical Provider, MD  traMADol-acetaminophen (ULTRACET) 37.5-325 MG per tablet Take 1 tablet by mouth every 6 (six) hours as needed for pain. 04/13/12  Yes Vickki Hearing, MD  triamterene-hydrochlorothiazide (MAXZIDE) 75-50 MG per tablet Take 1 tablet by mouth every morning.   Yes Historical Provider, MD  vitamin B-12 (CYANOCOBALAMIN) 1000 MCG tablet Take 1,000 mcg by mouth every morning.   Yes Historical Provider, MD  Insulin Aspart (NOVOLOG FLEXPEN [place]) Inject 15 Units into the skin 3 (three) times daily.    Historical Provider, MD    Scheduled  Meds: . amLODipine  10 mg Oral q morning - 10a  . aspirin EC  81 mg Oral q morning - 10a  . atorvastatin  20 mg Oral QHS  . Canagliflozin  300 mg Oral QAC breakfast  . carvedilol  25 mg Oral BID WC  . cloNIDine  0.2 mg Oral TID  . colesevelam  1,875 mg Oral TID  . dextromethorphan-guaiFENesin  1 tablet Oral BID  . enoxaparin (LOVENOX) injection  40 mg Subcutaneous Q24H  . insulin detemir  10 Units Subcutaneous QHS  . Liraglutide  1.2 mcg Subcutaneous QHS  . lisinopril  40 mg Oral q morning - 10a  . loratadine  10 mg Oral Daily  . metFORMIN  1,000 mg Oral BID WC  . minoxidil  10 mg Oral q morning - 10a  . pantoprazole  40 mg Oral Daily  . potassium chloride SA  20 mEq Oral QID  . sodium chloride  3 mL Intravenous Q12H  . terazosin  10 mg Oral QHS  . triamterene-hydrochlorothiazide  1 tablet Oral q morning - 10a  . vitamin B-12  1,000 mcg Oral q morning - 10a  . vitamin C  1,000 mg Oral q morning - 10a   Continuous Infusions:  PRN Meds:.sodium chloride, acetaminophen, acetaminophen, alum & mag hydroxide-simeth, HYDROcodone-acetaminophen, ondansetron (ZOFRAN) IV, ondansetron, sodium chloride, sodium chloride, traZODone    Blood pressure 146/69, pulse 78, temperature 97.5 F (36.4 C), temperature source Oral, resp. rate 20, height 5\' 10"  (1.778 m), weight 135.2 kg (298 lb 1 oz), SpO2 97.00%.   Results for orders placed during the hospital encounter of 01/18/13 (from the past 48 hour(s))  COMPREHENSIVE METABOLIC PANEL     Status: Abnormal   Collection Time    01/18/13  7:54 PM      Result Value Range   Sodium 143  135 - 145 mEq/L   Potassium 3.3 (*) 3.5 - 5.1 mEq/L   Chloride 102  96 - 112 mEq/L   CO2 31  19 - 32 mEq/L   Glucose, Bld 139 (*) 70 - 99 mg/dL   BUN 26 (*) 6 - 23 mg/dL   Creatinine, Ser 1.61  0.50 - 1.35 mg/dL   Calcium 9.6  8.4 - 09.6 mg/dL   Total Protein 7.3  6.0 - 8.3 g/dL   Albumin 3.9  3.5 - 5.2 g/dL   AST 26  0 - 37 U/L   ALT 35  0 - 53 U/L   Alkaline  Phosphatase 108  39 - 117 U/L   Total Bilirubin 0.9  0.3 - 1.2 mg/dL   GFR calc non Af Amer 77 (*) >90 mL/min   GFR calc Af Amer 89 (*) >  90 mL/min   Comment: (NOTE)     The eGFR has been calculated using the CKD EPI equation.     This calculation has not been validated in all clinical situations.     eGFR's persistently <90 mL/min signify possible Chronic Kidney     Disease.  CBC WITH DIFFERENTIAL     Status: Abnormal   Collection Time    01/18/13  7:54 PM      Result Value Range   WBC 11.5 (*) 4.0 - 10.5 K/uL   RBC 4.91  4.22 - 5.81 MIL/uL   Hemoglobin 14.2  13.0 - 17.0 g/dL   HCT 16.1  09.6 - 04.5 %   MCV 85.3  78.0 - 100.0 fL   MCH 28.9  26.0 - 34.0 pg   MCHC 33.9  30.0 - 36.0 g/dL   RDW 40.9  81.1 - 91.4 %   Platelets 218  150 - 400 K/uL   Neutrophils Relative % 66  43 - 77 %   Neutro Abs 7.6  1.7 - 7.7 K/uL   Lymphocytes Relative 19  12 - 46 %   Lymphs Abs 2.2  0.7 - 4.0 K/uL   Monocytes Relative 7  3 - 12 %   Monocytes Absolute 0.8  0.1 - 1.0 K/uL   Eosinophils Relative 7 (*) 0 - 5 %   Eosinophils Absolute 0.8 (*) 0.0 - 0.7 K/uL   Basophils Relative 0  0 - 1 %   Basophils Absolute 0.0  0.0 - 0.1 K/uL  PROTIME-INR     Status: None   Collection Time    01/18/13  7:54 PM      Result Value Range   Prothrombin Time 12.6  11.6 - 15.2 seconds   INR 0.96  0.00 - 1.49  HEMOGLOBIN A1C     Status: Abnormal   Collection Time    01/18/13  7:54 PM      Result Value Range   Hemoglobin A1C 7.3 (*) <5.7 %   Comment: (NOTE)                                                                               According to the ADA Clinical Practice Recommendations for 2011, when     HbA1c is used as a screening test:      >=6.5%   Diagnostic of Diabetes Mellitus               (if abnormal result is confirmed)     5.7-6.4%   Increased risk of developing Diabetes Mellitus     References:Diagnosis and Classification of Diabetes Mellitus,Diabetes     Care,2011,34(Suppl 1):S62-S69 and  Standards of Medical Care in             Diabetes - 2011,Diabetes Care,2011,34 (Suppl 1):S11-S61.   Mean Plasma Glucose 163 (*) <117 mg/dL   Comment: Performed at [facility]  GLUCOSE, CAPILLARY     Status: Abnormal   Collection Time    01/18/13  8:55 PM      Result Value Range   Glucose-Capillary 228 (*) 70 - 99 mg/dL   Comment 1 Notify RN    GLUCOSE, CAPILLARY  Status: Abnormal   Collection Time    01/19/13  8:02 AM      Result Value Range   Glucose-Capillary 141 (*) 70 - 99 mg/dL   Comment 1 Notify RN    GLUCOSE, CAPILLARY     Status: Abnormal   Collection Time    01/19/13 11:33 AM      Result Value Range   Glucose-Capillary 166 (*) 70 - 99 mg/dL   Comment 1 Notify RN      Ct Head Wo Contrast  01/18/2013   CLINICAL DATA:  Right-sided weakness. Diabetic hypertensive patient.  EXAM: CT HEAD WITHOUT CONTRAST  TECHNIQUE: Contiguous axial images were obtained from the base of the skull through the vertex without intravenous contrast.  COMPARISON:  None.  FINDINGS: No intracranial hemorrhage.  Prominent white matter type changes. There are several areas which could conceivably represent an acute infarct including that involving the left frontal and left temporal lobe. Without comparison examinations, it is difficult to state with certainty that there is no evidence of an acute infarct. MR imaging may prove helpful for further delineation.  No intracranial mass lesion noted on this unenhanced exam.  Mild atrophy without hydrocephalus.  IMPRESSION: No intracranial hemorrhage.  Prominent white matter type changes. There are several areas which could conceivably represent an acute infarct including that involving the left frontal and left temporal lobe. MR imaging may prove helpful for further delineation.  These results will be called to the ordering clinician or representative by the Radiologist Assistant, and communication documented in the PACS Dashboard.   Electronically Signed    By: Bridgett Larsson M.D.   On: 01/18/2013 17:05   Mr Brain Wo Contrast  01/19/2013   CLINICAL DATA:  62 year old male with syncope, weakness. Right side weakness. Initial encounter.  EXAM: MRI HEAD WITHOUT CONTRAST  TECHNIQUE: Multiplanar, multiecho pulse sequences of the brain and surrounding structures were obtained without intravenous contrast.  COMPARISON:  Non contrast head CT 01/18/2013.  FINDINGS: 15 mm focus of densely restricted diffusion in the left posterior corona radiata. Associated T2 and FLAIR hyperintensity. No associated mass effect or hemorrhage.  Superimposed patchy in confluent bilateral white matter T2 and FLAIR hyperintensity. No other areas of restricted diffusion. Major intracranial vascular flow voids are preserved.  No cortical encephalomalacia identified. Probable bilateral deep gray matter nuclei chronic lacunar infarcts. Mild to moderate T2 heterogeneity in the pons. Cerebellum within normal limits.  No ventriculomegaly. No midline shift, mass effect, or evidence of intracranial mass lesion. Negative pituitary, cervicomedullary junction and visualized cervical spine.  Visualized orbit soft tissues are within normal limits. Mild paranasal sinus mucosal thickening. Mastoids are clear. Negative scalp soft tissues. Visualized bone marrow signal within normal limits.  IMPRESSION: 1. Small acute infarct in the left cerebral white matter. No associated mass effect or hemorrhage.  2. Advanced nonspecific signal changes in the brain, favor underlying chronic small vessel disease.   Electronically Signed   By: Augusto Gamble M.D.   On: 01/19/2013 10:33   US Carotid Duplex Bilateral  01/19/2013   CLINICAL DATA:  CVA, right-sided weakness, history of diabetes  EXAM: BILATERAL CAROTID DUPLEX ULTRASOUND  TECHNIQUE: Wallace Cullens scale imaging, color Doppler and duplex ultrasound were performed of bilateral carotid and vertebral arteries in the neck.  COMPARISON:  Brain MRI -earlier same day ; head CT  -01/18/2013  FINDINGS: Criteria: Quantification of carotid stenosis is based on velocity parameters that correlate the residual internal carotid diameter with NASCET-based stenosis levels, using the diameter of  the distal internal carotid lumen as the denominator for stenosis measurement.  The following velocity measurements were obtained:  RIGHT  ICA:  98/25 cm/sec  CCA:  191/22 cm/sec  SYSTOLIC ICA/CCA RATIO:  0.52  DIASTOLIC ICA/CCA RATIO:  1.12  ECA:  128 cm/sec  LEFT  ICA:  85/24 cm/sec  CCA:  104/11 cm/sec  SYSTOLIC ICA/CCA RATIO:  0.82  DIASTOLIC ICA/CCA RATIO:  2.06  ECA:  146 cm/sec  RIGHT CAROTID ARTERY: There is mild tortuosity of the proximal aspect of the right common carotid artery (image 4). There is a minimal amount of eccentric intimal wall thickening and hypoechoic plaque within the left carotid bulb (images 14 and 16) extending to involve the origin and proximal aspect of the right internal carotid artery (image 24), not resulting in elevated peak systolic velocities within the right internal carotid artery to suggest a hemodynamically significant stenosis.  RIGHT VERTEBRAL ARTERY:  Antegrade flow  LEFT CAROTID ARTERY: There is a minimal amount of eccentric mixed echogenic plaque within the distal aspect of the left common carotid artery (images 44 and 45), extending to involve the left carotid bulb (images 49 and 50) as well as the origin and proximal aspect of the left internal carotid artery (image 58), not resulting in elevated peak systolic velocities within the left internal carotid artery to suggest a hemodynamically significant stenosis.  LEFT VERTEBRAL ARTERY:  Antegrade flow  IMPRESSION: Bilateral atherosclerotic plaque, left subjectively greater than right, not resulting in hemodynamically significant stenosis.   Electronically Signed   By: Simonne Come M.D.   On: 01/19/2013 11:01        Tawana Pasch A. Zachary Murillo, M.D.  Diplomate, Biomedical engineer of Psychiatry and Neurology (  Neurology). 01/19/2013, 5:16 PM

## 2013-01-19 NOTE — Progress Notes (Signed)
Inpatient Diabetes Program Recommendations  AACE/ADA: New Consensus Statement on Inpatient Glycemic Control (2013)  Target Ranges:  Prepandial:   less than 140 mg/dL      Peak postprandial:   less than 180 mg/dL (1-2 hours)      Critically ill patients:  140 - 180 mg/dL   Results for VALON, GLASSCOCK (MRN [id number]) as of 01/19/2013 09:06  Ref. Range 01/18/2013 20:55 01/19/2013 08:02  Glucose-Capillary Latest Range: 70-99 mg/dL 308 (H) 657 (H)    Inpatient Diabetes Program Recommendations Correction (SSI): While inpatient, please consider ordering Novolog correction [facility].   Notes:  Patient has a history of diabetes and takes Invokana 30 mg QAM, Levemir 10 units QHS, Victoza 1.2 mg QHS, Metformin 1000 mg BID, and Novolog 15 units TID (as needed) as an outpatient for diabetes management.  Currently, patient is ordered to receive Levemir 10 units QHS and Metformin 1000 mg BID for inpatient glycemic control.  While inpatient, please consider ordering Novolog correction.  Will continue to follow.   Thanks, Orlando Penner, RN, MSN, CCRN Diabetes Coordinator Inpatient Diabetes Program [phone] (Team Pager) [phone] ([facility] office) [phone] [facility] office)

## 2013-01-19 NOTE — Progress Notes (Signed)
Subjective: He says he still feels like he's a little bit weak and clumsy in his right arm and hand. He has no other new complaints. No speech abnormalities no problems with swallowing.  Objective: Vital signs in last 24 hours: Temp:  [97.7 F (36.5 C)-98.3 F (36.8 C)] 97.7 F (36.5 C) (11/11 0504) Pulse Rate:  [76-84] 76 (11/11 0504) Resp:  [19-20] 20 (11/11 0504) BP: (164-180)/(79-85) 164/82 mmHg (11/11 0504) SpO2:  [93 %-99 %] 96 % (11/11 0504) Weight:  [135.2 kg (298 lb 1 oz)] 135.2 kg (298 lb 1 oz) (11/11 0504) Weight change:  Last BM Date: 01/17/13  Intake/Output from previous day: 11/10 0701 - 11/11 0700 In: -  Out: 750 [Urine:750]  PHYSICAL EXAM General appearance: alert, cooperative, no distress and morbidly obese Resp: clear to auscultation bilaterally Cardio: regular rate and rhythm, S1, S2 normal, no murmur, click, rub or gallop GI: soft, non-tender; bowel sounds normal; no masses,  no organomegaly Extremities: Trace edema  Lab Results:    Basic Metabolic Panel:  Recent Labs  16/10/96 1954  NA 143  K 3.3*  CL 102  CO2 31  GLUCOSE 139*  BUN 26*  CREATININE 1.01  CALCIUM 9.6   Liver Function Tests:  Recent Labs  01/18/13 1954  AST 26  ALT 35  ALKPHOS 108  BILITOT 0.9  PROT 7.3  ALBUMIN 3.9   No results found for this basename: LIPASE, AMYLASE,  in the last 72 hours No results found for this basename: AMMONIA,  in the last 72 hours CBC:  Recent Labs  01/18/13 1954  WBC 11.5*  NEUTROABS 7.6  HGB 14.2  HCT 41.9  MCV 85.3  PLT 218   Cardiac Enzymes: No results found for this basename: CKTOTAL, CKMB, CKMBINDEX, TROPONINI,  in the last 72 hours BNP: No results found for this basename: PROBNP,  in the last 72 hours D-Dimer: No results found for this basename: DDIMER,  in the last 72 hours CBG:  Recent Labs  01/18/13 2055 01/19/13 0802  GLUCAP 228* 141*   Hemoglobin A1C: No results found for this basename: HGBA1C,  in the last  72 hours Fasting Lipid Panel: No results found for this basename: CHOL, HDL, LDLCALC, TRIG, CHOLHDL, LDLDIRECT,  in the last 72 hours Thyroid Function Tests: No results found for this basename: TSH, T4TOTAL, FREET4, T3FREE, THYROIDAB,  in the last 72 hours Anemia Panel: No results found for this basename: VITAMINB12, FOLATE, FERRITIN, TIBC, IRON, RETICCTPCT,  in the last 72 hours Coagulation:  Recent Labs  01/18/13 1954  LABPROT 12.6  INR 0.96   Urine Drug Screen: Drugs of Abuse  No results found for this basename: labopia, cocainscrnur, labbenz, amphetmu, thcu, labbarb    Alcohol Level: No results found for this basename: ETH,  in the last 72 hours Urinalysis: No results found for this basename: COLORURINE, APPERANCEUR, LABSPEC, PHURINE, GLUCOSEU, HGBUR, BILIRUBINUR, KETONESUR, PROTEINUR, UROBILINOGEN, NITRITE, LEUKOCYTESUR,  in the last 72 hours Misc. Labs:  ABGS No results found for this basename: PHART, PCO2, PO2ART, TCO2, HCO3,  in the last 72 hours CULTURES No results found for this or any previous visit (from the past 240 hour(s)). Studies/Results: Ct Head Wo Contrast  01/18/2013   CLINICAL DATA:  Right-sided weakness. Diabetic hypertensive patient.  EXAM: CT HEAD WITHOUT CONTRAST  TECHNIQUE: Contiguous axial images were obtained from the base of the skull through the vertex without intravenous contrast.  COMPARISON:  None.  FINDINGS: No intracranial hemorrhage.  Prominent white matter type changes.  There are several areas which could conceivably represent an acute infarct including that involving the left frontal and left temporal lobe. Without comparison examinations, it is difficult to state with certainty that there is no evidence of an acute infarct. MR imaging may prove helpful for further delineation.  No intracranial mass lesion noted on this unenhanced exam.  Mild atrophy without hydrocephalus.  IMPRESSION: No intracranial hemorrhage.  Prominent white matter type  changes. There are several areas which could conceivably represent an acute infarct including that involving the left frontal and left temporal lobe. MR imaging may prove helpful for further delineation.  These results will be called to the ordering clinician or representative by the Radiologist Assistant, and communication documented in the PACS Dashboard.   Electronically Signed   By: Bridgett Larsson M.D.   On: 01/18/2013 17:05    Medications:  Prior to Admission:  Prescriptions prior to admission  Medication Sig Dispense Refill  . acetaminophen (TYLENOL) 500 MG tablet Take 1,500 mg by mouth daily as needed for mild pain, moderate pain or headache.      Marland Kitchen amLODipine (NORVASC) 10 MG tablet Take 10 mg by mouth every morning.      . Ascorbic Acid (VITAMIN C) 1000 MG tablet Take 1,000 mg by mouth every morning.      Marland Kitchen aspirin EC 81 MG tablet Take 81 mg by mouth every morning.      Marland Kitchen atorvastatin (LIPITOR) 20 MG tablet Take 20 mg by mouth at bedtime.      . Canagliflozin (INVOKANA) 300 MG TABS Take 300 mg by mouth every morning.      . carvedilol (COREG) 25 MG tablet Take 25 mg by mouth 2 (two) times daily with a meal.      . cetirizine (ZYRTEC) 10 MG tablet Take 10 mg by mouth daily.      . cloNIDine (CATAPRES) 0.2 MG tablet Take 0.2 mg by mouth 3 (three) times daily.      . colesevelam (WELCHOL) 625 MG tablet Take 1,875 mg by mouth 3 (three) times daily.      Marland Kitchen dextromethorphan-guaiFENesin (MUCINEX DM) 30-600 MG per 12 hr tablet Take 1 tablet by mouth 2 (two) times daily.      . Insulin Detemir (LEVEMIR FLEXPEN) 100 UNIT/ML SOPN Inject 10 Units into the skin at bedtime.      . lansoprazole (PREVACID) 30 MG capsule Take 30 mg by mouth 2 (two) times daily.      . Liraglutide (VICTOZA) 18 MG/3ML SOPN Inject 1.2 mcg into the skin at bedtime.      Marland Kitchen lisinopril (PRINIVIL,ZESTRIL) 40 MG tablet Take 40 mg by mouth every morning.      . metFORMIN (GLUCOPHAGE) 1000 MG tablet Take 1,000 mg by mouth 2 (two)  times daily with a meal.      . minoxidil (LONITEN) 10 MG tablet Take 10 mg by mouth every morning.      . Multiple Vitamins-Minerals (CENTRUM PO) Take 1 tablet by mouth every morning.      . potassium chloride SA (K-DUR,KLOR-CON) 20 MEQ tablet Take 20 mEq by mouth 3 (three) times daily.      . sodium chloride (OCEAN) 0.65 % nasal spray Place 1 spray into the nose as needed for congestion.      Marland Kitchen terazosin (HYTRIN) 10 MG capsule Take 10 mg by mouth at bedtime.      . traMADol-acetaminophen (ULTRACET) 37.5-325 MG per tablet Take 1 tablet by mouth every 6 (six) hours  as needed for pain.  90 tablet  5  . triamterene-hydrochlorothiazide (MAXZIDE) 75-50 MG per tablet Take 1 tablet by mouth every morning.      . vitamin B-12 (CYANOCOBALAMIN) 1000 MCG tablet Take 1,000 mcg by mouth every morning.      . Insulin Aspart (NOVOLOG FLEXPEN [place]) Inject 15 Units into the skin 3 (three) times daily.      . [DISCONTINUED] Docusate Calcium (STOOL SOFTENER PO) Take by mouth.       Scheduled: . amLODipine  10 mg Oral q morning - 10a  . aspirin EC  81 mg Oral q morning - 10a  . atorvastatin  20 mg Oral QHS  . Canagliflozin  300 mg Oral QAC breakfast  . carvedilol  25 mg Oral BID WC  . cloNIDine  0.2 mg Oral TID  . colesevelam  1,875 mg Oral TID  . dextromethorphan-guaiFENesin  1 tablet Oral BID  . enoxaparin (LOVENOX) injection  40 mg Subcutaneous Q24H  . insulin detemir  10 Units Subcutaneous QHS  . Liraglutide  1.2 mcg Subcutaneous QHS  . lisinopril  40 mg Oral q morning - 10a  . loratadine  10 mg Oral Daily  . metFORMIN  1,000 mg Oral BID WC  . minoxidil  10 mg Oral q morning - 10a  . pantoprazole  40 mg Oral Daily  . potassium chloride SA  20 mEq Oral QID  . sodium chloride  3 mL Intravenous Q12H  . terazosin  10 mg Oral QHS  . triamterene-hydrochlorothiazide  1 tablet Oral q morning - 10a  . vitamin B-12  1,000 mcg Oral q morning - 10a  . vitamin C  1,000 mg Oral q morning - 10a   Continuous:   ZOX:WRUEAV chloride, acetaminophen, acetaminophen, alum & mag hydroxide-simeth, HYDROcodone-acetaminophen, ondansetron (ZOFRAN) IV, ondansetron, sodium chloride, sodium chloride, traZODone  Assesment: He was admitted with abnormalities that I think are a stroke. His CT is not normal but it is nonspecific. He is scheduled for MRI. He will also have a carotid Doppler and PT consultation and neurology consultation. He had a cardiac catheterization about 2 weeks ago so I am not going to repeat echocardiogram Active Problems:   * No active hospital problems. *    Plan: As above    LOS: 1 day   Micah Galeno L 01/19/2013, 8:49 AM

## 2013-01-20 LAB — GLUCOSE, CAPILLARY: Glucose-Capillary: 158 mg/dL — ABNORMAL HIGH (ref 70–99)

## 2013-01-20 MED ORDER — SERTRALINE HCL 50 MG PO TABS
50.0000 mg | ORAL_TABLET | Freq: Every day | ORAL | Status: DC
  Administered 2013-01-20: 50 mg via ORAL
  Filled 2013-01-20: qty 1

## 2013-01-20 MED ORDER — SERTRALINE HCL 50 MG PO TABS: 50.0000 mg | ORAL_TABLET | Freq: Every day | ORAL | Status: AC

## 2013-01-20 MED ORDER — STROKE: EARLY STAGES OF RECOVERY BOOK
Freq: Once | Status: AC
  Administered 2013-01-20: 15:00:00
  Filled 2013-01-20: qty 1

## 2013-01-20 NOTE — Progress Notes (Signed)
UR Chart Review Completed  

## 2013-01-20 NOTE — Progress Notes (Signed)
Patient with orders to be discharge home. Discharge instructions given, educational materials given, patient and spouse verbalized understanding. Patient set up with outpatient PT. Patient stable. Patient left in private vehicle with wife.

## 2013-01-20 NOTE — Evaluation (Addendum)
Physical Therapy Evaluation Patient Details Name: Zachary Murillo MRN: [id number] DOB: [birth year] Today's Date: 01/20/2013 Time: 0820-0905 PT Time Calculation (min): 45 min  PT Assessment / Plan / Recommendation History of Present Illness   Pt  complaints of right side  weakness. He did not have any slurred speech. He did not have any  trouble swallowing. He says his entire right side feels heavy. He had  a CT done and that shows 2 areas that could indicate a previous stroke   Clinical Impression  Per wife pt has chronic low back pain  as well as a history of knee pain which he has been told that he need a TKR.  Wife states that pt normally has an deviated gait and that it appears to be close to borderline.        PT Assessment  All further PT needs can be met in the next venue of care    Follow Up Recommendations  Outpatient PT    Does the patient have the potential to tolerate intense rehabilitation    no  Barriers to Discharge  none      Equipment Recommendations  Rolling walker with 5" wheels    Recommendations for Other Services OT consult   Frequency      Precautions / Restrictions Precautions Precautions: Fall Restrictions Weight Bearing Restrictions: No   Pertinent Vitals/Pain None stated      Mobility  Bed Mobility Details for Bed Mobility Assistance: mod I for all  Transfers Transfers: Sit to Stand Sit to Stand: 6: Modified independent (Device/Increase time) Ambulation/Gait Ambulation/Gait Assistance: 6: Modified independent (Device/Increase time) Assistive device: Rolling walker Gait Pattern: Within Functional Limits Gait velocity: normal    Exercises General Exercises - Lower Extremity Gluteal Sets: 10 reps (prone hip extension) Hip ABduction/ADduction: Right;10 reps;Sidelying Straight Leg Raises: Right;10 reps;Supine   PT Diagnosis: Difficulty walking;Hemiplegia dominant side  PT Problem List: Decreased strength;Decreased balance PT Treatment  Interventions:   given HEP for both balance and strengthening    PT Goals(Current goals can be found in the care plan section) Acute Rehab PT Goals PT Goal Formulation: No goals set, d/c therapy Potential to Achieve Goals: Good  Visit Information  Last PT Received On: 01/20/13       Prior Functioning  Home Living Family/patient expects to be discharged to:: Private residence Living Arrangements: Spouse/significant other Available Help at Discharge: Family Type of Home: House Home Access: Stairs to enter Secretary/administrator of Steps: 1 Entrance Stairs-Rails: Right Home Layout: One level Home Equipment: None Prior Function Level of Independence: Independent Communication Communication: No difficulties Dominant Hand: Right    Cognition  Cognition Arousal/Alertness: Awake/alert Overall Cognitive Status: Within Functional Limits for tasks assessed    Extremity/Trunk Assessment Lower Extremity Assessment Lower Extremity Assessment: RLE deficits/detail RLE Deficits / Details: Rt hip flexor 4/5; Abduction 3+/5; extenson 3/5; hamstring mm 3+/5   Balance Standardized Balance Assessment Standardized Balance Assessment: Berg Balance Test Berg Balance Test Sit to Stand: Able to stand without using hands and stabilize independently Standing Unsupported: Able to stand safely 2 minutes Sitting with Back Unsupported but Feet Supported on Floor or Stool: Able to sit safely and securely 2 minutes Stand to Sit: Controls descent by using hands Transfers: Able to transfer safely, definite need of hands Standing Unsupported with Eyes Closed: Able to stand 10 seconds safely Standing Ubsupported with Feet Together: Able to place feet together independently and stand for 1 minute with supervision From Standing, Reach Forward with Outstretched Arm:  Can reach confidently >25 cm (10") From Standing Position, Pick up Object from Floor: Able to pick up shoe, needs supervision From Standing  Position, Turn to Look Behind Over each Shoulder: Looks behind from both sides and weight shifts well Turn 360 Degrees: Able to turn 360 degrees safely but slowly Standing Unsupported, Alternately Place Feet on Step/Stool: Able to complete 4 steps without aid or supervision Standing Unsupported, One Foot in Front: Able to plae foot ahead of the other independently and hold 30 seconds Standing on One Leg: Able to lift leg independently and hold equal to or more than 3 seconds Total Score: 45  End of Session PT - End of Session Equipment Utilized During Treatment: Gait belt Activity Tolerance: Patient tolerated treatment well Patient left: in chair;with call bell/phone within reach;with family/visitor present Nurse Communication: Mobility status  GP     Ethne Jeon,CINDY 01/20/2013, 9:29 AM

## 2013-01-20 NOTE — Progress Notes (Signed)
Subjective: He still has weakness and clumsiness of his right arm. He is right-handed. I think his leg is a little bit better. Dr. Gerilyn Pilgrim has seen him and recommended the possibility of inpatient rehabilitation. I don't have the transcribed note yet.  Objective: Vital signs in last 24 hours: Temp:  [97.5 F (36.4 C)-97.8 F (36.6 C)] 97.6 F (36.4 C) (11/12 0600) Pulse Rate:  [72-85] 85 (11/12 0600) Resp:  [16-20] 16 (11/12 0600) BP: (146-154)/(69-77) 149/77 mmHg (11/12 0600) SpO2:  [97 %-98 %] 98 % (11/12 0600) Weight:  [135 kg (297 lb 9.9 oz)] 135 kg (297 lb 9.9 oz) (11/12 0600) Weight change: -0.2 kg (-7.1 oz) Last BM Date: 01/17/13  Intake/Output from previous day: 11/11 0701 - 11/12 0700 In: 720 [P.O.:720] Out: 3100 [Urine:3100]  PHYSICAL EXAM General appearance: alert, cooperative, mild distress and morbidly obese Resp: clear to auscultation bilaterally Cardio: regular rate and rhythm, S1, S2 normal, no murmur, click, rub or gallop GI: soft, non-tender; bowel sounds normal; no masses,  no organomegaly Extremities: extremities normal, atraumatic, no cyanosis or edema  Lab Results:    Basic Metabolic Panel:  Recent Labs  16/10/96 1954  NA 143  K 3.3*  CL 102  CO2 31  GLUCOSE 139*  BUN 26*  CREATININE 1.01  CALCIUM 9.6   Liver Function Tests:  Recent Labs  01/18/13 1954  AST 26  ALT 35  ALKPHOS 108  BILITOT 0.9  PROT 7.3  ALBUMIN 3.9   No results found for this basename: LIPASE, AMYLASE,  in the last 72 hours No results found for this basename: AMMONIA,  in the last 72 hours CBC:  Recent Labs  01/18/13 1954  WBC 11.5*  NEUTROABS 7.6  HGB 14.2  HCT 41.9  MCV 85.3  PLT 218   Cardiac Enzymes: No results found for this basename: CKTOTAL, CKMB, CKMBINDEX, TROPONINI,  in the last 72 hours BNP: No results found for this basename: PROBNP,  in the last 72 hours D-Dimer: No results found for this basename: DDIMER,  in the last 72  hours CBG:  Recent Labs  01/18/13 2055 01/19/13 0802 01/19/13 1133 01/19/13 1704 01/19/13 2122 01/20/13 0731  GLUCAP 228* 141* 166* 116* 151* 135*   Hemoglobin A1C:  Recent Labs  01/18/13 1954  HGBA1C 7.3*   Fasting Lipid Panel: No results found for this basename: CHOL, HDL, LDLCALC, TRIG, CHOLHDL, LDLDIRECT,  in the last 72 hours Thyroid Function Tests: No results found for this basename: TSH, T4TOTAL, FREET4, T3FREE, THYROIDAB,  in the last 72 hours Anemia Panel: No results found for this basename: VITAMINB12, FOLATE, FERRITIN, TIBC, IRON, RETICCTPCT,  in the last 72 hours Coagulation:  Recent Labs  01/18/13 1954  LABPROT 12.6  INR 0.96   Urine Drug Screen: Drugs of Abuse  No results found for this basename: labopia, cocainscrnur, labbenz, amphetmu, thcu, labbarb    Alcohol Level: No results found for this basename: ETH,  in the last 72 hours Urinalysis: No results found for this basename: COLORURINE, APPERANCEUR, LABSPEC, PHURINE, GLUCOSEU, HGBUR, BILIRUBINUR, KETONESUR, PROTEINUR, UROBILINOGEN, NITRITE, LEUKOCYTESUR,  in the last 72 hours Misc. Labs:  ABGS No results found for this basename: PHART, PCO2, PO2ART, TCO2, HCO3,  in the last 72 hours CULTURES No results found for this or any previous visit (from the past 240 hour(s)). Studies/Results: Ct Head Wo Contrast  01/18/2013   CLINICAL DATA:  Right-sided weakness. Diabetic hypertensive patient.  EXAM: CT HEAD WITHOUT CONTRAST  TECHNIQUE: Contiguous axial images were obtained  from the base of the skull through the vertex without intravenous contrast.  COMPARISON:  None.  FINDINGS: No intracranial hemorrhage.  Prominent white matter type changes. There are several areas which could conceivably represent an acute infarct including that involving the left frontal and left temporal lobe. Without comparison examinations, it is difficult to state with certainty that there is no evidence of an acute infarct. MR  imaging may prove helpful for further delineation.  No intracranial mass lesion noted on this unenhanced exam.  Mild atrophy without hydrocephalus.  IMPRESSION: No intracranial hemorrhage.  Prominent white matter type changes. There are several areas which could conceivably represent an acute infarct including that involving the left frontal and left temporal lobe. MR imaging may prove helpful for further delineation.  These results will be called to the ordering clinician or representative by the Radiologist Assistant, and communication documented in the PACS Dashboard.   Electronically Signed   By: Bridgett Larsson M.D.   On: 01/18/2013 17:05   Mr Brain Wo Contrast  01/19/2013   CLINICAL DATA:  62 year old male with syncope, weakness. Right side weakness. Initial encounter.  EXAM: MRI HEAD WITHOUT CONTRAST  TECHNIQUE: Multiplanar, multiecho pulse sequences of the brain and surrounding structures were obtained without intravenous contrast.  COMPARISON:  Non contrast head CT 01/18/2013.  FINDINGS: 15 mm focus of densely restricted diffusion in the left posterior corona radiata. Associated T2 and FLAIR hyperintensity. No associated mass effect or hemorrhage.  Superimposed patchy in confluent bilateral white matter T2 and FLAIR hyperintensity. No other areas of restricted diffusion. Major intracranial vascular flow voids are preserved.  No cortical encephalomalacia identified. Probable bilateral deep gray matter nuclei chronic lacunar infarcts. Mild to moderate T2 heterogeneity in the pons. Cerebellum within normal limits.  No ventriculomegaly. No midline shift, mass effect, or evidence of intracranial mass lesion. Negative pituitary, cervicomedullary junction and visualized cervical spine.  Visualized orbit soft tissues are within normal limits. Mild paranasal sinus mucosal thickening. Mastoids are clear. Negative scalp soft tissues. Visualized bone marrow signal within normal limits.  IMPRESSION: 1. Small acute  infarct in the left cerebral white matter. No associated mass effect or hemorrhage.  2. Advanced nonspecific signal changes in the brain, favor underlying chronic small vessel disease.   Electronically Signed   By: Augusto Gamble M.D.   On: 01/19/2013 10:33   US Carotid Duplex Bilateral  01/19/2013   CLINICAL DATA:  CVA, right-sided weakness, history of diabetes  EXAM: BILATERAL CAROTID DUPLEX ULTRASOUND  TECHNIQUE: Wallace Cullens scale imaging, color Doppler and duplex ultrasound were performed of bilateral carotid and vertebral arteries in the neck.  COMPARISON:  Brain MRI -earlier same day ; head CT -01/18/2013  FINDINGS: Criteria: Quantification of carotid stenosis is based on velocity parameters that correlate the residual internal carotid diameter with NASCET-based stenosis levels, using the diameter of the distal internal carotid lumen as the denominator for stenosis measurement.  The following velocity measurements were obtained:  RIGHT  ICA:  98/25 cm/sec  CCA:  191/22 cm/sec  SYSTOLIC ICA/CCA RATIO:  0.52  DIASTOLIC ICA/CCA RATIO:  1.12  ECA:  128 cm/sec  LEFT  ICA:  85/24 cm/sec  CCA:  104/11 cm/sec  SYSTOLIC ICA/CCA RATIO:  0.82  DIASTOLIC ICA/CCA RATIO:  2.06  ECA:  146 cm/sec  RIGHT CAROTID ARTERY: There is mild tortuosity of the proximal aspect of the right common carotid artery (image 4). There is a minimal amount of eccentric intimal wall thickening and hypoechoic plaque within the left carotid bulb (  images 14 and 16) extending to involve the origin and proximal aspect of the right internal carotid artery (image 24), not resulting in elevated peak systolic velocities within the right internal carotid artery to suggest a hemodynamically significant stenosis.  RIGHT VERTEBRAL ARTERY:  Antegrade flow  LEFT CAROTID ARTERY: There is a minimal amount of eccentric mixed echogenic plaque within the distal aspect of the left common carotid artery (images 44 and 45), extending to involve the left carotid bulb (images 49  and 50) as well as the origin and proximal aspect of the left internal carotid artery (image 58), not resulting in elevated peak systolic velocities within the left internal carotid artery to suggest a hemodynamically significant stenosis.  LEFT VERTEBRAL ARTERY:  Antegrade flow  IMPRESSION: Bilateral atherosclerotic plaque, left subjectively greater than right, not resulting in hemodynamically significant stenosis.   Electronically Signed   By: Simonne Come M.D.   On: 01/19/2013 11:01    Medications:  Prior to Admission:  Prescriptions prior to admission  Medication Sig Dispense Refill  . acetaminophen (TYLENOL) 500 MG tablet Take 1,500 mg by mouth daily as needed for mild pain, moderate pain or headache.      Marland Kitchen amLODipine (NORVASC) 10 MG tablet Take 10 mg by mouth every morning.      . Ascorbic Acid (VITAMIN C) 1000 MG tablet Take 1,000 mg by mouth every morning.      Marland Kitchen aspirin EC 81 MG tablet Take 81 mg by mouth every morning.      Marland Kitchen atorvastatin (LIPITOR) 20 MG tablet Take 20 mg by mouth at bedtime.      . Canagliflozin (INVOKANA) 300 MG TABS Take 300 mg by mouth every morning.      . carvedilol (COREG) 25 MG tablet Take 25 mg by mouth 2 (two) times daily with a meal.      . cetirizine (ZYRTEC) 10 MG tablet Take 10 mg by mouth daily.      . cloNIDine (CATAPRES) 0.2 MG tablet Take 0.2 mg by mouth 3 (three) times daily.      . colesevelam (WELCHOL) 625 MG tablet Take 1,875 mg by mouth 3 (three) times daily.      Marland Kitchen dextromethorphan-guaiFENesin (MUCINEX DM) 30-600 MG per 12 hr tablet Take 1 tablet by mouth 2 (two) times daily.      . Insulin Detemir (LEVEMIR FLEXPEN) 100 UNIT/ML SOPN Inject 10 Units into the skin at bedtime.      . lansoprazole (PREVACID) 30 MG capsule Take 30 mg by mouth 2 (two) times daily.      . Liraglutide (VICTOZA) 18 MG/3ML SOPN Inject 1.2 mcg into the skin at bedtime.      Marland Kitchen lisinopril (PRINIVIL,ZESTRIL) 40 MG tablet Take 40 mg by mouth every morning.      . metFORMIN  (GLUCOPHAGE) 1000 MG tablet Take 1,000 mg by mouth 2 (two) times daily with a meal.      . minoxidil (LONITEN) 10 MG tablet Take 10 mg by mouth every morning.      . Multiple Vitamins-Minerals (CENTRUM PO) Take 1 tablet by mouth every morning.      . potassium chloride SA (K-DUR,KLOR-CON) 20 MEQ tablet Take 20 mEq by mouth 3 (three) times daily.      . sodium chloride (OCEAN) 0.65 % nasal spray Place 1 spray into the nose as needed for congestion.      Marland Kitchen terazosin (HYTRIN) 10 MG capsule Take 10 mg by mouth at bedtime.      Marland Kitchen  traMADol-acetaminophen (ULTRACET) 37.5-325 MG per tablet Take 1 tablet by mouth every 6 (six) hours as needed for pain.  90 tablet  5  . triamterene-hydrochlorothiazide (MAXZIDE) 75-50 MG per tablet Take 1 tablet by mouth every morning.      . vitamin B-12 (CYANOCOBALAMIN) 1000 MCG tablet Take 1,000 mcg by mouth every morning.      . Insulin Aspart (NOVOLOG FLEXPEN [place]) Inject 15 Units into the skin 3 (three) times daily.      . [DISCONTINUED] Docusate Calcium (STOOL SOFTENER PO) Take by mouth.       Scheduled: . amLODipine  10 mg Oral q morning - 10a  . aspirin EC  81 mg Oral q morning - 10a  . atorvastatin  20 mg Oral QHS  . Canagliflozin  300 mg Oral QAC breakfast  . carvedilol  25 mg Oral BID WC  . cloNIDine  0.2 mg Oral TID  . colesevelam  1,875 mg Oral TID  . dextromethorphan-guaiFENesin  1 tablet Oral BID  . enoxaparin (LOVENOX) injection  40 mg Subcutaneous Q24H  . insulin detemir  10 Units Subcutaneous QHS  . Liraglutide  1.2 mcg Subcutaneous QHS  . lisinopril  40 mg Oral q morning - 10a  . loratadine  10 mg Oral Daily  . metFORMIN  1,000 mg Oral BID WC  . minoxidil  10 mg Oral q morning - 10a  . pantoprazole  40 mg Oral Daily  . potassium chloride SA  20 mEq Oral QID  . sodium chloride  3 mL Intravenous Q12H  . terazosin  10 mg Oral QHS  . triamterene-hydrochlorothiazide  1 tablet Oral q morning - 10a  . vitamin B-12  1,000 mcg Oral q morning - 10a  .  vitamin C  1,000 mg Oral q morning - 10a   Continuous:  WJX:BJYNWG chloride, acetaminophen, acetaminophen, alum & mag hydroxide-simeth, HYDROcodone-acetaminophen, ondansetron (ZOFRAN) IV, ondansetron, sodium chloride, sodium chloride, traZODone  Assesment: He had a stroke. He is still having problems with his right arm. He has severe problems with hypertension but that is doing relatively well. He has diabetes which is been pre-well controlled. He has a mildly low cardiac ejection fraction probably related to his severe hypertension. He seems to be starting to get some problems with depression. He is morbidly obese and hoping to have obesity surgery soon Active Problems:   * No active hospital problems. *    Plan: I will add an antidepressant. He is having PT evaluation now.    LOS: 2 days   Inna Tisdell L 01/20/2013, 8:47 AM

## 2013-01-22 ENCOUNTER — Ambulatory Visit (HOSPITAL_COMMUNITY)
Admission: RE | Admit: 2013-01-22 | Discharge: 2013-01-22 | Disposition: A | Discharge status: Home / Self Care | Payer: BC Managed Care – PPO | Source: Ambulatory Visit | Attending: Pulmonary Disease | Admitting: Pulmonary Disease

## 2013-01-22 DIAGNOSIS — I69993 Ataxia following unspecified cerebrovascular disease: Secondary | ICD-10-CM | POA: Insufficient documentation

## 2013-01-22 DIAGNOSIS — I639 Cerebral infarction, unspecified: Secondary | ICD-10-CM | POA: Insufficient documentation

## 2013-01-22 DIAGNOSIS — I69998 Other sequelae following unspecified cerebrovascular disease: Secondary | ICD-10-CM | POA: Insufficient documentation

## 2013-01-22 DIAGNOSIS — R279 Unspecified lack of coordination: Secondary | ICD-10-CM | POA: Insufficient documentation

## 2013-01-22 DIAGNOSIS — IMO0001 Reserved for inherently not codable concepts without codable children: Secondary | ICD-10-CM | POA: Insufficient documentation

## 2013-01-22 DIAGNOSIS — M6281 Muscle weakness (generalized): Secondary | ICD-10-CM | POA: Insufficient documentation

## 2013-01-22 HISTORY — DX: Unspecified lack of coordination: R27.9

## 2013-01-22 HISTORY — DX: Cerebral infarction, unspecified: I63.9

## 2013-01-22 NOTE — Consult Note (Signed)
[facility] Melquiades Kovar A. Gerilyn Pilgrim, MD     [email or web address]         NAME:  Zachary Murillo, Zachary Murillo                ACCOUNT NO.:  [id number]  MEDICAL RECORD NO.:  [id number]  LOCATION:  A341                          FACILITY:  [facility]  PHYSICIAN:  Walton Digilio A. Gerilyn Pilgrim, M.D. DATE OF BIRTH:  1951-03-04  DATE OF CONSULTATION:  01/19/2013 DATE OF DISCHARGE:  01/20/2013                                CONSULTATION   ASSESSMENT: 1. Acute subcortical infarct.  Treatment type/Risk factors:  Hypertension, diabetes, and age, also the patient has dyslipidemia.  The patient has been on aspirin, we will add Plavix.  I think we can do this combination for 3 months and subsequently, the patient should be placed on a single antiplatelet agent.  We can go back to aspirin or doing Plavix afterwards.  The patient is currently on appropriate proton-pump inhibitor and doing aspirin may be better than doing Plavix.  He did have an extensive discussion with the family and I think the patient just would be a good candidate for inpatient rehab.  He recently had been started on a statin medication, we just think it is appropriate.  Additional risk factor reduction including blood pressure and diabetes control.  Echo has been obtained and is pending.  2. Extensive ischemic white matter changes.  Again, the treatment is     risk factor reduction.  The patient is a 62 year old right-handed white male, who presents with a 2-day history of gait instability and difficulty using the right upper extremity.  The patient also presents with some right leg dysfunction/incoordination.  The patient decided to seek medical attention on the insistent of his wife.  He went to his primary care provider, Dr. Juanetta Gosling who noted that the patient had difficulties writing using his right hand.  He was sent over to the hospital for further evaluation.  The patient has been on aspirin and has been compliant with it.  He is also on a  statin medication which was started about a month ago.  He has been compliant with this.  The patient does not report having dysarthria, dysphagia, headaches, blurred vision, or diplopia.  No chest pain or  shortness of breath.  No GI or GU symptoms.  REVIEW OF SYSTEMS:  Otherwise negative.  PHYSICAL EXAMINATION:  GENERAL:  Shows a pleasant, obese man in no acute distress. HEENT EVALUATION:  Neck is supple.  Head is normocephalic, atraumatic. ABDOMEN:  Soft. EXTREMITIES:  No significant edema. MENTATION:  He is awake and alert.  Speech, language, and cognition are intact.  Cranial nerve evaluation shows the pupils are equal, round, reactive to light and accommodation.  Visual fields are intact.  Facial muscle strength is symmetric and normal.  Tongue is midline.  Uvula midline.  Shoulder shrug is slightly weak on the right.  Motor examination shows classic right upper extremity pronator drift.  Deltoid strength is 4+.  Triceps is also 4+.  Right lower extremity, hip flexion 4+, dorsiflexion 5.  Left side shows normal tone, bulk, and strength. Reflexes are diminished throughout.  Sensation normal to light touch and temperature.  Coordination shows dysmetria in right upper  extremity, commensurate with the associated weakness.  Please see the computer generated notes for other details.  Thanks for this consultation.     Finneus Kaneshiro A. Gerilyn Pilgrim, M.D.     KAD/MEDQ  D:  01/20/2013  T:  01/21/2013  Job:  [id number]

## 2013-01-22 NOTE — Evaluation (Signed)
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 Diabetes mellitus   . Sleep apnea   . Overweight(278.02)   . Hypertension    Past Surgical History:  Past Surgical History  Procedure Laterality Date  . Knee arthroscopy    . Nasal septum surgery    . Cardiac catheterization      Subjective S:  I want to be able to use my arm like normal again. Pertinent History: Zachary Murillo began experiencing symptoms consistent with a CVA on 01/17/13 while at [facility].  He and his wife drove home and consulted with Dr. Juanetta Gosling who admitted him to [facility] for an acute CVA with right sided weakness.  he was discharged home on 01/20/13 and has been referred to occupational therapy for evaluation and treatment.   Limitations: Zachary Murillo has right shoulder bursitis, which has limited his shoulder mobility, right knee arthritis, and has a slipped lumbar disc that can cause him to fall once in a very great while.  Patient Stated Goals:   I want to be able to use my arm like normal again. 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  Assessment ADL/Vision/Perception ADL ADL Comments: Zachary Murillo is receiving min pa with LE and UE dressing and bathing, he is completing all other activities with his non dominant left hand.  Dominant Hand: Right Vision - History Baseline Vision: No visual deficits Perception Perception: Within Functional Limits Praxis Praxis: Intact  Cognition/Observation Cognition Overall Cognitive Status: Within Functional Limits for tasks assessed  Sensation/Coordination/Edema Sensation Light Touch: Appears Intact Stereognosis: Appears Intact Coordination Gross Motor Movements are Fluid and Coordinated: No (patients rate of movement and fluidity are fair) Fine Motor Movements are Fluid and Coordinated: No Coordination and Movement Description: Patient demonstrates slower rates of movement and less fluid movements in his right arm Finger Nose Finger Test: fair + 9 Hole Peg Test: right 49.64" with proper in hand manipulation skills, left 32.5"  Additional Assessments RUE AROM (degrees) RUE Overall AROM Comments: AROM is WFL, shoulder flexion and abduction are limited to 90 degrees due to bursitis RUE PROM (degrees) RUE Overall PROM Comments: PROM is [facility] RUE Strength RUE Overall Strength Comments: RUE strength is 5/5 except for his rotator cuff, which is 3+/5 Grip (lbs): 39 (left 77) Lateral Pinch: 23 lbs (left 22) 3 Point Pinch: 8 lbs (left 18) Right Hand Strength - Pinch (lbs) Lateral Pinch: 23 lbs (left 22) 3 Point Pinch: 8 lbs (left 18)   Occupational Therapy Assessment and Plan OT Assessment and Plan Clinical Impression Statement: A:  Patient  presents with decreased use of RUE and decreased independence with all B/IADLs, work, and leisure activities  s/p CVA with right sided weakness.  Pt will benefit from skilled therapeutic intervention in order to improve on the following deficits: Decreased activity tolerance;Decreased coordination;Decreased strength;Impaired UE functional use Rehab Potential: Excellent OT Frequency: Min 3X/week OT Duration: 6 weeks OT Treatment/Interventions: Self-care/ADL training;Therapeutic exercise;Neuromuscular education;Modalities;Manual therapy;Splinting;Therapeutic activities;Patient/family education OT Plan: P:  Skilled OT intervention to improve strength, GMC, FMC, and independence with all B/IADLs, work, and leisure activities.  Treatment Plan:  GMC, FMC, Grip strengthening, bilateral integration activities, ADL training as needed.    Goals Short Term Goals Time to Complete Short Term Goals: 3 weeks Short Term Goal 1: Patient will be educated on a HEP. Short Term Goal 2: Patient will increase GMC from fair to good - for increased use of his right arm as dominant. Short Term Goal 3: Patient will increase rate of speed with fine motor tasks by completing the nine hole peg test in 40" or less. Short Term Goal 4: Patient will be SBA with all BADLs. Short Term Goal 5: Patient will improve his right grip strength by 15 pounds and pinch strength by 2 pounds for increased ability to open containers. Long Term Goals Time to Complete Long Term Goals: 6 weeks Long Term Goal 1: Patient will use his right arm as dominant with all B/IADLs, work, and leisure activities and complete activities at prior level of Independence.  Long Term Goal 2: Patient will increase GMC from good - to normal for increased use of his right arm as dominant. Long Term Goal 3: Patient will increase rate of speed with fine motor tasks by completing the nine hole peg test in 30" or less. Long Term Goal 4: Patient will improve his right grip strength by 30 pounds and pinch strength by 4 pounds for increased ability to open containers. Long Term  Goal 5: Patient will resume driving without modifications.   Problem List Patient Active Problem List   Diagnosis Date Noted  . CVA (cerebral infarction) 01/22/2013  . Lack of coordination 01/22/2013  . OA (osteoarthritis) of knee 09/05/2011  . Knee pain, right 09/05/2011  . KNEE, ARTHRITIS, DEGEN./OSTEO 01/30/2009  . SHOULDER PAIN 01/16/2009  . KNEE PAIN 01/16/2009  . IMPINGEMENT SYNDROME 01/16/2009  . TENDINITIS, LEFT KNEE 01/16/2009    End of Session Activity Tolerance: Patient tolerated treatment well General Behavior During Therapy: WFL for tasks assessed/performed OT Plan of Care OT Home Exercise Plan: Educated patient on tendon glides, theraputty (red), fine motor coordination, gross motor coordination, handouts given. Consulted and Agree with Plan of Care: Patient  GO    Shirlean Mylar, OTR/L  01/22/2013, 1:31 PM  Physician Documentation Your signature is required to indicate approval of the treatment plan as stated above.  Please sign and either send electronically or make a copy of this report for your files and return this physician signed original.  Please mark one 1.__approve of plan  2. ___approve of plan with the following conditions.   ______________________________                                                          _____________________ Physician Signature  Date  

## 2013-01-22 NOTE — Discharge Summary (Signed)
Physician Discharge Summary  Patient ID: Zachary Murillo MRN: [id number] DOB/AGE: 63-Mar-1952 63 y.o. Primary Care Physician:Elia Keenum L, MD Admit date: 01/18/2013 Discharge date: 01/22/2013    Discharge Diagnoses:  Acute stroke Diabetes2 Hypertension Morbid obesity Osteoarthritis of multiple joints Hyperlipidemia Mild depression Active Problems:   * No active hospital problems. *     Medication List    STOP taking these medications       STOOL SOFTENER PO      TAKE these medications       acetaminophen 500 MG tablet  Commonly known as:  TYLENOL  Take 1,500 mg by mouth daily as needed for mild pain, moderate pain or headache.     amLODipine 10 MG tablet  Commonly known as:  NORVASC  Take 10 mg by mouth every morning.     aspirin EC 81 MG tablet  Take 81 mg by mouth every morning.     atorvastatin 20 MG tablet  Commonly known as:  LIPITOR  Take 20 mg by mouth at bedtime.     carvedilol 25 MG tablet  Commonly known as:  COREG  Take 25 mg by mouth 2 (two) times daily with a meal.     CENTRUM PO  Take 1 tablet by mouth every morning.     cetirizine 10 MG tablet  Commonly known as:  ZYRTEC  Take 10 mg by mouth daily.     cloNIDine 0.2 MG tablet  Commonly known as:  CATAPRES  Take 0.2 mg by mouth 3 (three) times daily.     colesevelam 625 MG tablet  Commonly known as:  WELCHOL  Take 1,875 mg by mouth 3 (three) times daily.     dextromethorphan-guaiFENesin 30-600 MG per 12 hr tablet  Commonly known as:  MUCINEX DM  Take 1 tablet by mouth 2 (two) times daily.     INVOKANA 300 MG Tabs  Generic drug:  Canagliflozin  Take 300 mg by mouth every morning.     lansoprazole 30 MG capsule  Commonly known as:  PREVACID  Take 30 mg by mouth 2 (two) times daily.     LEVEMIR FLEXPEN 100 UNIT/ML Sopn  Generic drug:  Insulin Detemir  Inject 10 Units into the skin at bedtime.     lisinopril 40 MG tablet  Commonly known as:  PRINIVIL,ZESTRIL  Take 40 mg by  mouth every morning.     metFORMIN 1000 MG tablet  Commonly known as:  GLUCOPHAGE  Take 1,000 mg by mouth 2 (two) times daily with a meal.     minoxidil 10 MG tablet  Commonly known as:  LONITEN  Take 10 mg by mouth every morning.     NOVOLOG FLEXPEN [place]  Inject 15 Units into the skin 3 (three) times daily.     potassium chloride SA 20 MEQ tablet  Commonly known as:  K-DUR,KLOR-CON  Take 20 mEq by mouth 3 (three) times daily.     sertraline 50 MG tablet  Commonly known as:  ZOLOFT  Take 1 tablet (50 mg total) by mouth daily.     sodium chloride 0.65 % nasal spray  Commonly known as:  OCEAN  Place 1 spray into the nose as needed for congestion.     terazosin 10 MG capsule  Commonly known as:  HYTRIN  Take 10 mg by mouth at bedtime.     traMADol-acetaminophen 37.5-325 MG per tablet  Commonly known as:  ULTRACET  Take 1 tablet by mouth every 6 (six) hours as  needed for pain.     triamterene-hydrochlorothiazide 75-50 MG per tablet  Commonly known as:  MAXZIDE  Take 1 tablet by mouth every morning.     VICTOZA 18 MG/3ML Sopn  Generic drug:  Liraglutide  Inject 1.2 mcg into the skin at bedtime.     vitamin B-12 1000 MCG tablet  Commonly known as:  CYANOCOBALAMIN  Take 1,000 mcg by mouth every morning.     vitamin C 1000 MG tablet  Take 1,000 mg by mouth every morning.        Discharged Condition: Improved    Consults: Neurology, Dr. Gerilyn Pilgrim  Significant Diagnostic Studies: Ct Head Wo Contrast  01/18/2013   CLINICAL DATA:  Right-sided weakness. Diabetic hypertensive patient.  EXAM: CT HEAD WITHOUT CONTRAST  TECHNIQUE: Contiguous axial images were obtained from the base of the skull through the vertex without intravenous contrast.  COMPARISON:  None.  FINDINGS: No intracranial hemorrhage.  Prominent white matter type changes. There are several areas which could conceivably represent an acute infarct including that involving the left frontal and left temporal lobe.  Without comparison examinations, it is difficult to state with certainty that there is no evidence of an acute infarct. MR imaging may prove helpful for further delineation.  No intracranial mass lesion noted on this unenhanced exam.  Mild atrophy without hydrocephalus.  IMPRESSION: No intracranial hemorrhage.  Prominent white matter type changes. There are several areas which could conceivably represent an acute infarct including that involving the left frontal and left temporal lobe. MR imaging may prove helpful for further delineation.  These results will be called to the ordering clinician or representative by the Radiologist Assistant, and communication documented in the PACS Dashboard.   Electronically Signed   By: Bridgett Larsson M.D.   On: 01/18/2013 17:05   Mr Brain Wo Contrast  01/19/2013   CLINICAL DATA:  62 year old male with syncope, weakness. Right side weakness. Initial encounter. syncope, weakness. Right side weakness. Initial encounter.  EXAM: MRI HEAD WITHOUT CONTRAST  TECHNIQUE: Multiplanar, multiecho pulse sequences of the brain and surrounding structures were obtained without intravenous contrast.  COMPARISON:  Non contrast head CT 01/18/2013.  FINDINGS: 15 mm focus of densely restricted diffusion in the left posterior corona radiata. Associated T2 and FLAIR hyperintensity. No associated mass effect or hemorrhage.  Superimposed patchy in confluent bilateral white matter T2 and FLAIR hyperintensity. No other areas of restricted diffusion. Major intracranial vascular flow voids are preserved.  No cortical encephalomalacia identified. Probable bilateral deep gray matter nuclei chronic lacunar infarcts. Mild to moderate T2 heterogeneity in the pons. Cerebellum within normal limits.  No ventriculomegaly. No midline shift, mass effect, or evidence of intracranial mass lesion. Negative pituitary, cervicomedullary junction and visualized cervical spine.  Visualized orbit soft tissues are within normal limits. Mild paranasal sinus mucosal thickening. Mastoids  are clear. Negative scalp soft tissues. Visualized bone marrow signal within normal limits.  IMPRESSION: 1. Small acute infarct in the left cerebral white matter. No associated mass effect or hemorrhage.  2. Advanced nonspecific signal changes in the brain, favor underlying chronic small vessel disease.   Electronically Signed   By: Augusto Gamble M.D.   On: 01/19/2013 10:33   US Carotid Duplex Bilateral  01/19/2013   CLINICAL DATA:  CVA, right-sided weakness, history of diabetes  EXAM: BILATERAL CAROTID DUPLEX ULTRASOUND  TECHNIQUE: Wallace Cullens scale imaging, color Doppler and duplex ultrasound were performed of bilateral carotid and vertebral arteries in the neck.  COMPARISON:  Brain MRI -earlier same day ; head CT -01/18/2013  FINDINGS: Criteria:  Quantification of carotid stenosis is based on velocity parameters that correlate the residual internal carotid diameter with NASCET-based stenosis levels, using the diameter of the distal internal carotid lumen as the denominator for stenosis measurement.  The following velocity measurements were obtained:  RIGHT  ICA:  98/25 cm/sec  CCA:  191/22 cm/sec  SYSTOLIC ICA/CCA RATIO:  0.52  DIASTOLIC ICA/CCA RATIO:  1.12  ECA:  128 cm/sec  LEFT  ICA:  85/24 cm/sec  CCA:  104/11 cm/sec  SYSTOLIC ICA/CCA RATIO:  0.82  DIASTOLIC ICA/CCA RATIO:  2.06  ECA:  146 cm/sec  RIGHT CAROTID ARTERY: There is mild tortuosity of the proximal aspect of the right common carotid artery (image 4). There is a minimal amount of eccentric intimal wall thickening and hypoechoic plaque within the left carotid bulb (images 14 and 16) extending to involve the origin and proximal aspect of the right internal carotid artery (image 24), not resulting in elevated peak systolic velocities within the right internal carotid artery to suggest a hemodynamically significant stenosis.  RIGHT VERTEBRAL ARTERY:  Antegrade flow  LEFT CAROTID ARTERY: There is a minimal amount of eccentric mixed echogenic plaque within the  distal aspect of the left common carotid artery (images 44 and 45), extending to involve the left carotid bulb (images 49 and 50) as well as the origin and proximal aspect of the left internal carotid artery (image 58), not resulting in elevated peak systolic velocities within the left internal carotid artery to suggest a hemodynamically significant stenosis.  LEFT VERTEBRAL ARTERY:  Antegrade flow  IMPRESSION: Bilateral atherosclerotic plaque, left subjectively greater than right, not resulting in hemodynamically significant stenosis.   Electronically Signed   By: Simonne Come M.D.   On: 01/19/2013 11:01    Lab Results: Basic Metabolic Panel: No results found for this basename: NA, K, CL, CO2, GLUCOSE, BUN, CREATININE, CALCIUM, MG, PHOS,  in the last 72 hours Liver Function Tests: No results found for this basename: AST, ALT, ALKPHOS, BILITOT, PROT, ALBUMIN,  in the last 72 hours   CBC: No results found for this basename: WBC, NEUTROABS, HGB, HCT, MCV, PLT,  in the last 72 hours  No results found for this or any previous visit (from the past 240 hour(s)).   Hospital Course: This is a 62 year old who's had difficulty with difficult to control hypertension over the last several months. He is being evaluated for potential obesity surgery but his blood pressure has not been controlled. He had been away from home with his wife and developed some difficulty with the right side of his body. He seemed to be clumsy and weak. This improved somewhat in the first 24 hours but then he came to my office and was found to still have right hand clumsiness had difficulty with writing although he is right-handed and had some weakness in the arm and leg. He was sent for CT scan which showed potential stroke. He was admitted to the hospital at that point. He was continued on his regular medications because his blood pressure has finally come under control. He had MRI of the brain which showed an acute infarct. He had  neurology consultation and it was felt that he should be on Plavix and aspirin for now. He was evaluated by physical therapy and there was initially some thought that he might need inpatient rehabilitation but his functional status was too high for that so he was discharged home for outpatient rehabilitation  Discharge Exam: Blood pressure 122/67, pulse 81, temperature 98.3  F (36.8 C), temperature source Oral, resp. rate 16, height 5\' 10"  (1.778 m), weight 135 kg (297 lb 9.9 oz), SpO2 97.00%. He is awake and alert. He is morbidly obese. His chest is clear. His heart is regular.  Disposition: Home for outpatient rehabilitation       Future Appointments Provider Department Dept Phone   01/22/2013 11:45 AM [address] [place] [facility] [phone]   01/25/2013 1:45 PM Matilde Haymaker, PT [place] [facility] [phone]   01/27/2013 1:45 PM Juel Burrow, PTA [facility] [phone]   01/29/2013 2:30 PM Juel Burrow, PTA [facility] [phone]   02/01/2013 11:00 AM Matilde Haymaker, PT [facility] [phone]   02/03/2013 10:15 AM Matt Holmes [place] [facility] [phone]   02/08/2013 1:00 PM Velora Mediate, OT [facility] [phone]   Joint Appt Anice Paganini [facility] [phone]   02/10/2013 1:00 PM Bethany Rita Ohara, OTR [facility] [phone]   Joint Appt Samuel Bouche [facility] [phone]   02/12/2013 1:00 PM Velora Mediate, OT [facility] [phone]   Joint Appt Anice Paganini [facility] [phone]   02/15/2013 1:00 PM Bethany Rita Ohara, OTR [facility] [phone]   Joint Appt Matilde Haymaker, PT [facility]  [facility] [phone]   02/17/2013 1:00 PM Velora Mediate, OT [facility] [phone]   Joint Appt Anice Paganini [facility] [phone]   02/19/2013 1:00 PM Velora Mediate, OT [facility] [phone]   Joint Appt Juel Burrow, PTA [facility] [phone]        Signed: Fredirick Maudlin Pager [phone]  01/22/2013, 8:13 AM

## 2013-01-25 ENCOUNTER — Ambulatory Visit (HOSPITAL_COMMUNITY)
Admission: RE | Admit: 2013-01-25 | Discharge: 2013-01-25 | Disposition: A | Discharge status: Home / Self Care | Payer: BC Managed Care – PPO | Source: Ambulatory Visit | Attending: Pulmonary Disease | Admitting: Pulmonary Disease

## 2013-01-25 DIAGNOSIS — R262 Difficulty in walking, not elsewhere classified: Secondary | ICD-10-CM

## 2013-01-25 HISTORY — DX: Difficulty in walking, not elsewhere classified: R26.2

## 2013-01-25 NOTE — Evaluation (Signed)
Physical Therapy Evaluation  Patient Details  Name: Zachary Murillo MRN: [id number] Date of Birth: 1950/11/07  Today's Date: 01/25/2013 Time: 1350-1435 PT Time Calculation (min): 45 min Charges: 1 evaluation             Visit#: 1 of 12  Re-eval: 02/24/13 Assessment Diagnosis: CVA Next MD Visit: Dr. Juanetta Gosling - unscheudled  Past Medical History:  Past Medical History  Diagnosis Date  . Diabetes mellitus   . Sleep apnea   . Overweight(278.02)   . Hypertension    Past Surgical History:  Past Surgical History  Procedure Laterality Date  . Knee arthroscopy    . Nasal septum surgery    . Cardiac catheterization     Subjective Symptoms/Limitations Pertinent History: Pt is a 62 year old male referred to PT for CVA.  Back in October he was undergoing tests and measures to see if he was fit for gastric bypass surgery and was going up to [facility].  Mr. Hawes began experiencing symptoms consistent with a CVA on 01/17/13 while at [facility].  He and his wife drove home and consulted with Dr. Juanetta Gosling who admitted him to [facility] for an acute CVA with right sided weakness.  he was discharged home on 01/20/13 and has been referred to occupational therapy for evaluation and treatment.    Limitations: Standing;Walking;House hold activities How long can you sit comfortably?: no difficulty How long can you stand comfortably?: 3 minutes How long can you walk comfortably?: 3 minutes  Patient Stated Goals: Be able to walk independently.  Get back to helping with housework.  Pain Assessment Currently in Pain?: No/denies Pain Score:  (shoulder pain)  Precautions/Restrictions  Precautions Precautions: Fall  Balance Screening Balance Screen Has the patient fallen in the past 6 months: No Has the patient had a decrease in activity level because of a fear of falling? : Yes Is the patient reluctant to leave their home because of a fear of falling? 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  Sensation/Coordination/Flexibility/Functional Tests Functional Tests Functional Tests: 5 sit to stands 20 seconds  RLE Strength Right Hip Flexion: 4/5 Right Hip Extension: 4/5 Right Hip ABduction: 4/5 Right Hip ADduction: 4/5 Right Knee Flexion: 4/5 Right Knee Extension: 4/5  Mobility/Balance  Transfers Transfers: Sit to Stand Sit to Stand: 6: Modified independent (Device/Increase time) Ambulation/Gait Ambulation/Gait: Yes Ambulation/Gait Assistance: 6: Modified independent (Device/Increase time) Ambulation Distance (Feet): 167 Feet (2 minutes) Assistive device: Rolling walker Gait velocity: 1.39 ft/sec Berg Balance Test Sit to Stand: Able to stand without using hands and stabilize independently Standing Unsupported: Able to stand safely 2 minutes Sitting with Back Unsupported but Feet Supported on Floor or Stool: Able to sit safely and securely 2 minutes Stand to Sit: Sits safely with minimal use of hands Transfers: Able to transfer safely, definite need of hands Standing Unsupported with Eyes Closed: Able to stand 10 seconds safely Standing Ubsupported with Feet Together: Able to place feet together independently and stand for 1 minute with supervision From Standing, Reach Forward with Outstretched Arm: Can  reach forward >5 cm safely (2") From Standing Position, Pick up Object from Floor: Able to pick up shoe, needs  supervision From Standing Position, Turn to Look Behind Over each Shoulder: Looks behind one side only/other side shows less weight shift Turn 360 Degrees: Able to turn 360 degrees safely but slowly Standing Unsupported, Alternately Place Feet on Step/Stool: Able to complete >2 steps/needs minimal assist Standing Unsupported, One Foot in Front: Able to take small step independently and hold 30 seconds Standing on One Leg: Able to lift leg independently and hold equal to or more than 3 seconds Total Score: 41 Timed Up and Go Test TUG: Normal TUG Normal TUG (seconds): 31 (w/RW)   Physical Therapy Assessment and Plan PT Assessment and Plan Clinical Impression Statement: Mr. Steppe began experiencing symptoms consistent with a CVA on 01/17/13 while at [facility].  He was discharged home on 01/20/13 with impairments listed below.  After evaulation pt is 4/5 for RLE strength and is most limited by his balance impairments.  Pt will benefit from skilled therapeutic intervention in order to improve on the following deficits: Abnormal gait;Decreased balance;Decreased strength;Difficulty walking;Impaired perceived functional ability;Decreased mobility;Improper body mechanics PT Frequency: Min 3X/week PT Duration: 6 weeks PT Treatment/Interventions: DME instruction;Gait training;Stair training;Functional mobility training;Therapeutic activities;Therapeutic exercise;Balance training;Neuromuscular re-education;Patient/family education PT Plan: [place] balance program.Side stepping, retro gait, double limb Narrow BOS stance, foot on step, partial tandem stance.  Stregnthening using Cybex when able. use NuStep to improve acitivity tolerance.     Goals Home Exercise Program Pt/caregiver will Perform Home Exercise Program: Independently;For increased strengthening;For improved balance PT Goal: Perform Home Exercise Program - Progress: Goal set today PT Short Term Goals Time to Complete Short Term Goals: 3  weeks PT Short Term Goal 1: Pt will improve his prorioceptive awareness and begin ambulating with a single point cane.   PT Short Term Goal 2: Pt will improve his gait velocity to 3.07 ft/sec with a SPC for normalized age range.  PT Long Term Goals Time to Complete Long Term Goals:  (6 weeks) PT Long Term Goal 1: Pt will improve his activity tolerance in order to ambulate x15 minutes independently in an indoor and outdoor environment to continue with a walking program for weight loss.  PT Long Term Goal 2: Pt will improve his Berg Balance Score to 50/56 to decrease risk of falls.  Long Term Goal 3: Pt will improve his TUG time to less than 13 seconds walking independently in order to safely ambulate in the community.  Long Term Goal 4: Pt will improve his functional strength and activity tolerance 5 times in 8.4 seconds for normalized age range.   Problem List Patient Active Problem List   Diagnosis Date Noted  . Difficulty in walking(719.7) 01/25/2013  . CVA (cerebral infarction) 01/22/2013  . Lack of coordination 01/22/2013  . OA (osteoarthritis) of knee 09/05/2011  . Knee pain, right 09/05/2011  . KNEE, ARTHRITIS, DEGEN./OSTEO 01/30/2009  . SHOULDER PAIN 01/16/2009  . KNEE PAIN 01/16/2009  . IMPINGEMENT SYNDROME 01/16/2009  . TENDINITIS, LEFT KNEE 01/16/2009    PT Plan of Care PT Home Exercise Plan: given PT Patient Instructions: importance of continuing with household activities to improve activity tolerance, HEP by a bed, counter and chair Consulted and Agree with Plan of Care: Patient;Family member/caregiver;Other (Comment) Family Member Consulted: Wife Inetta Fermo)  GP    Pammy Vesey, MPT, ATC 01/25/2013, 2:57 PM  Physician Documentation Your signature is required to indicate approval of the treatment plan as stated  above.  Please sign and either send electronically or make a copy of this report for your files and return this physician signed original.   Please mark one  1.__approve of plan  2. ___approve of plan with the following conditions.   ______________________________                                                          _____________________ Physician Signature                                                                                                             Date

## 2013-01-27 ENCOUNTER — Ambulatory Visit (HOSPITAL_COMMUNITY)
Admission: RE | Admit: 2013-01-27 | Discharge: 2013-01-27 | Disposition: A | Discharge status: Home / Self Care | Payer: BC Managed Care – PPO | Source: Ambulatory Visit | Attending: Pulmonary Disease | Admitting: Pulmonary Disease

## 2013-01-27 ENCOUNTER — Inpatient Hospital Stay (HOSPITAL_COMMUNITY)
Admission: RE | Admit: 2013-01-27 | Discharge: 2013-01-27 | Disposition: A | Discharge status: Home / Self Care | Payer: BC Managed Care – PPO | Source: Ambulatory Visit | Attending: Specialist | Admitting: Specialist

## 2013-01-27 DIAGNOSIS — R279 Unspecified lack of coordination: Secondary | ICD-10-CM

## 2013-01-27 DIAGNOSIS — I639 Cerebral infarction, unspecified: Secondary | ICD-10-CM

## 2013-01-27 DIAGNOSIS — R262 Difficulty in walking, not elsewhere classified: Secondary | ICD-10-CM

## 2013-01-27 NOTE — Progress Notes (Signed)
Occupational Therapy Treatment Patient Details  Name: Zachary Murillo MRN: [id number] Date of Birth: 1950-12-27  Today's Date: 01/27/2013 Time: 7829-5621 OT Time Calculation (min): 45 min Neuro reeducation 45' Visit#: 2 of 18  Re-eval: 02/19/13    Subjective  S:  The shoulder exercises are really irritating my shoulder.  (discussed possiblity that innervation to muscles in and around shoulder may have been effected, causing biomechanics of shoulder to change - added scapular stability exercises to alleviate shoulder pain.) Pain Assessment Currently in Pain?: Yes Pain Score: 3  Pain Location: Shoulder Pain Orientation: Right Pain Type: Acute pain  Precautions/Restrictions   n/a  Exercise/Treatments Neurological Re-education  Seated Elevation: 20 reps;AROM Extension: AROM;20 reps Retraction: AROM;20 reps ROM / Strengthening / Isometric Strengthening Proximal Shoulder Strengthening, Seated: 10 times each in front of mirror for improved proprioception and technique, bilateral integration good this date, pain in shoulder is higher     Hand Exercises Theraputty: Flatten;Roll;Grip;Pinch;Locate Pegs Theraputty - Flatten: pink Theraputty - Roll: pink Theraputty - Grip: with supinated and pronated forearm Theraputty - Pinch: pink Theraputty - Locate Pegs: 10 beads and cleaned excess putty off of beads Hand Gripper with Large Beads: 1 large bead, on subsequent attempts, wrist flexed and patient unable to maintain grip on beads Sponges: 16, 16, 15   Sponges: 16, 16, 15 Hand Gripper with Large Beads: 1 large bead, on subsequent attempts, wrist flexed and patient unable to maintain grip on beads   Grasp and Release Theraputty - Flatten: pink Theraputty - Roll: pink Theraputty - Grip: with supinated and pronated forearm Theraputty - Pinch: pink Theraputty - Locate Pegs: 10 beads and cleaned excess putty off of beads   Occupational Therapy Assessment and Plan OT Assessment and  Plan Clinical Impression Statement: A: Patient unable to isolate shoulder vs elbow movement with proximal shoulder strengthening this date, once mirror used for feedback, form and shoulder use greatly improved.  GMC of shoulder movements improved this date.  OT Plan: P:  Patient will use hand gripper to pick up 3 large beads while maintaining wrist extension, card activities for fmc and bilateral integration.   Goals Short Term Goals Short Term Goal 1: Patient will be educated on a HEP. Short Term Goal 1 Progress: Progressing toward goal Short Term Goal 2: Patient will increase GMC from fair to good - for increased use of his right arm as dominant. Short Term Goal 2 Progress: Progressing toward goal Short Term Goal 3: Patient will increase rate of speed with fine motor tasks by completing the nine hole peg test in 40" or less. Short Term Goal 3 Progress: Progressing toward goal Short Term Goal 4: Patient will be SBA with all BADLs. Short Term Goal 4 Progress: Progressing toward goal Short Term Goal 5: Patient will improve his right grip strength by 15 pounds and pinch strength by 2 pounds for increased ability to open containers. Short Term Goal 5 Progress: Progressing toward goal Long Term Goals Time to Complete Long Term Goals: 6 weeks Long Term Goal 1: Patient will use his right arm as dominant with all B/IADLs, work, and leisure activities and complete activities at prior level of Independence.  Long Term Goal 1 Progress: Progressing toward goal Long Term Goal 2: Patient will increase GMC from good - to normal for increased use of his right arm as dominant. Long Term Goal 2 Progress: Progressing toward goal Long Term Goal 3: Patient will increase rate of speed with fine motor tasks by completing the nine hole  peg test in 30" or less. Long Term Goal 3 Progress: Progressing toward goal Long Term Goal 4: Patient will improve his right grip strength by 30 pounds and pinch strength by 4 pounds  for increased ability to open containers. Long Term Goal 4 Progress: Progressing toward goal Long Term Goal 5: Patient will resume driving without modifications.  Long Term Goal 5 Progress: Progressing toward goal  Problem List Patient Active Problem List   Diagnosis Date Noted  . Difficulty in walking(719.7) 01/25/2013  . CVA (cerebral infarction) 01/22/2013  . Lack of coordination 01/22/2013  . OA (osteoarthritis) of knee 09/05/2011  . Knee pain, right 09/05/2011  . KNEE, ARTHRITIS, DEGEN./OSTEO 01/30/2009  . SHOULDER PAIN 01/16/2009  . KNEE PAIN 01/16/2009  . IMPINGEMENT SYNDROME 01/16/2009  . TENDINITIS, LEFT KNEE 01/16/2009    End of Session Activity Tolerance: Patient tolerated treatment well General Behavior During Therapy: [facility] for tasks assessed/performed  GO    Shirlean Mylar, OTR/L  01/27/2013, 5:11 PM

## 2013-01-27 NOTE — Progress Notes (Signed)
Physical Therapy Treatment Patient Details  Name: Zachary Murillo MRN: [id number] Date of Birth: 1950/09/26  Today's Date: 01/27/2013 Time: 7829-5621 PT Time Calculation (min): 38 min Charge: TE 23' 1526-1549, NMR [id number]  Visit#: 2 of 12  Re-eval: 02/24/13 Assessment Diagnosis: CVA Next MD Visit: Dr. Juanetta Gosling - unscheudled  Subjective: Symptoms/Limitations Symptoms: Pt stated compliance with HEP, no questions about exercises.  Pain free today.   Pain Assessment Currently in Pain?: No/denies  Precautions/Restrictions  Precautions Precautions: Fall  Exercise/Treatments Aerobic Stationary Bike: Nustep 10' hill level 3, resistance 3 SPM>70 Standing Heel Raises: 10 reps;Limitations Heel Raises Limitations: toe raises 10x  Knee Flexion: Both;10 reps Functional Squat: 10 reps;Limitations Functional Squat Limitations: PT facilitation  SLS: 2x 30" with 1 finger assistance Other Standing Knee Exercises: Tandem stance 2x 30" no HHA on 6 in step, standing NBOS with alternating UE flexion x 10, side stepping and retro gait 1RT Other Standing Knee Exercises: toe tapping 20x alternating    Physical Therapy Assessment and Plan PT Assessment and Plan Clinical Impression Statement: Began POC with [place] balacne trainging and exercises for funcitional strengthening..  Pt able to complete all exercises correctly with min cueing for form and technique.  Min guard/assistance required with balance activities, improved following vc-ing for spatial awareness.   PT Plan: Continue with current POC, progress to tandem gait and strengthening using cybex when able.      Goals Home Exercise Program Pt/caregiver will Perform Home Exercise Program: Independently;For increased strengthening;For improved balance PT Short Term Goals Time to Complete Short Term Goals: 3 weeks PT Short Term Goal 1: Pt will improve his prorioceptive awareness and begin ambulating with a single point cane.   PT Short Term  Goal 1 - Progress: Progressing toward goal PT Short Term Goal 2: Pt will improve his gait velocity to 3.07 ft/sec with a SPC for normalized age range.  PT Long Term Goals Time to Complete Long Term Goals:  (6 weeks) PT Long Term Goal 1: Pt will improve his activity tolerance in order to ambulate x15 minutes independently in an indoor and outdoor environment to continue with a walking program for weight loss.  PT Long Term Goal 1 - Progress: Progressing toward goal PT Long Term Goal 2: Pt will improve his Berg Balance Score to 50/56 to decrease risk of falls.  PT Long Term Goal 2 - Progress: Progressing toward goal Long Term Goal 3: Pt will improve his TUG time to less than 13 seconds walking independently in order to safely ambulate in the community.  Long Term Goal 4: Pt will improve his functional strength and activity tolerance 5 times in 8.4 seconds for normalized age range.  Long Term Goal 4 Progress: Progressing toward goal  Problem List Patient Active Problem List   Diagnosis Date Noted  . Difficulty in walking(719.7) 01/25/2013  . CVA (cerebral infarction) 01/22/2013  . Lack of coordination 01/22/2013  . OA (osteoarthritis) of knee 09/05/2011  . Knee pain, right 09/05/2011  . KNEE, ARTHRITIS, DEGEN./OSTEO 01/30/2009  . SHOULDER PAIN 01/16/2009  . KNEE PAIN 01/16/2009  . IMPINGEMENT SYNDROME 01/16/2009  . TENDINITIS, LEFT KNEE 01/16/2009    PT - End of Session Equipment Utilized During Treatment: Gait belt Activity Tolerance: Patient tolerated treatment well General Behavior During Therapy: [facility] for tasks assessed/performed  GP    Juel Burrow 01/27/2013, 4:12 PM

## 2013-01-29 ENCOUNTER — Ambulatory Visit (HOSPITAL_COMMUNITY)
Admission: RE | Admit: 2013-01-29 | Discharge: 2013-01-29 | Disposition: A | Discharge status: Home / Self Care | Payer: BC Managed Care – PPO | Source: Ambulatory Visit | Attending: Pulmonary Disease | Admitting: Pulmonary Disease

## 2013-01-29 DIAGNOSIS — R279 Unspecified lack of coordination: Secondary | ICD-10-CM

## 2013-01-29 DIAGNOSIS — R262 Difficulty in walking, not elsewhere classified: Secondary | ICD-10-CM

## 2013-01-29 DIAGNOSIS — I639 Cerebral infarction, unspecified: Secondary | ICD-10-CM

## 2013-01-29 NOTE — Progress Notes (Signed)
Physical Therapy Treatment Patient Details  Name: ROLAND LIPKE MRN: [id number] Date of Birth: 06-May-1950  Today's Date: 01/29/2013 Time: 4098-1191 PT Time Calculation (min): 48 min Charge: TE 10', NMR 28', Gait 10'  Visit#: 3 of 12  Re-eval: 02/24/13 Assessment Diagnosis: CVA Next MD Visit: Dr. Juanetta Gosling - 12/08, Romeo Apple 02/16/2013  Subjective: Symptoms/Limitations Symptoms: Pt reported he is a little sore following last session, no real pain. Pain Assessment Currently in Pain?: No/denies  Precautions/Restrictions  Precautions Precautions: Fall  Exercise/Treatments Aerobic Stationary Bike: Nustep 5' due to OT time' hill level 3, resistance 3 SPM>70 Machines for Strengthening Cybex Knee Extension: 3PL Bil LE 10x Cybex Knee Flexion: 5 PL Bil LE 10x Standing Heel Raises: 10 reps;Limitations Heel Raises Limitations: toe raises 10x  Functional Squat: 10 reps;Limitations Functional Squat Limitations: PT facilitation  Rocker Board: 2 minutes;Limitations Rocker Board Limitations: R/L Other Standing Knee Exercises: Tandem stance 3x 30" on 6in step no HHA, tandem gait 1RT, sidestepping 2RT with green tband, retro gait 2RT  Seated Other Seated Knee Exercises: sitting on theraball TrA to reduce LBP, LAQ 5x Bil LE     Physical Therapy Assessment and Plan PT Assessment and Plan Clinical Impression Statement: Pt progressing well towards balance traning, functional strengthening and gait training without AD.  Increaed difficulty with balance activities with min A required, began cybex weight machines and began gait training with no AD.  Pt reported LBP while sitting on couch, pt educated on benefits of sitting on theraball instead of couch for seated balance training and core strengthening, min guard with seated exercises.   PT Plan: Continue progressing with current POC for balance, strengthening and gait training with no AD.    Goals Home Exercise Program Pt/caregiver will  Perform Home Exercise Program: Independently;For increased strengthening;For improved balance PT Short Term Goals Time to Complete Short Term Goals: 3 weeks PT Short Term Goal 1: Pt will improve his prorioceptive awareness and begin ambulating with a single point cane.   PT Short Term Goal 1 - Progress: Progressing toward goal PT Short Term Goal 2: Pt will improve his gait velocity to 3.07 ft/sec with a SPC for normalized age range.  PT Short Term Goal 2 - Progress: Progressing toward goal PT Long Term Goals Time to Complete Long Term Goals:  (6 weeks) PT Long Term Goal 1: Pt will improve his activity tolerance in order to ambulate x15 minutes independently in an indoor and outdoor environment to continue with a walking program for weight loss.  PT Long Term Goal 1 - Progress: Progressing toward goal PT Long Term Goal 2: Pt will improve his Berg Balance Score to 50/56 to decrease risk of falls.  PT Long Term Goal 2 - Progress: Progressing toward goal Long Term Goal 3: Pt will improve his TUG time to less than 13 seconds walking independently in order to safely ambulate in the community.  Long Term Goal 4: Pt will improve his functional strength and activity tolerance 5 times in 8.4 seconds for normalized age range.   Problem List Patient Active Problem List   Diagnosis Date Noted  . Difficulty in walking(719.7) 01/25/2013  . CVA (cerebral infarction) 01/22/2013  . Lack of coordination 01/22/2013  . OA (osteoarthritis) of knee 09/05/2011  . Knee pain, right 09/05/2011  . KNEE, ARTHRITIS, DEGEN./OSTEO 01/30/2009  . SHOULDER PAIN 01/16/2009  . KNEE PAIN 01/16/2009  . IMPINGEMENT SYNDROME 01/16/2009  . TENDINITIS, LEFT KNEE 01/16/2009    PT - End of Session Equipment Utilized During  Treatment: Gait belt Activity Tolerance: Patient tolerated treatment well;Patient limited by fatigue General Behavior During Therapy: [facility] for tasks assessed/performed  GP    Juel Burrow 01/29/2013, 5:42 PM

## 2013-01-29 NOTE — Progress Notes (Signed)
Occupational Therapy Treatment Patient Details  Name: Zachary Murillo MRN: [id number] Date of Birth: [birth year]  Today's Date: 01/29/2013 Time: 1515-1600 OT Time Calculation (min): 45 min Neuro reeducation 45' Visit#: 3 of 18  Re-eval: 02/19/13     Subjective S:  Its hard to keep my wrist straight when I squeeze that.  Pain Assessment Currently in Pain?: No/denies Pain Score: 0-No pain  Precautions/Restrictions   fall risk  Exercise/Treatments Seated Extension: Theraband;15 reps Theraband Level (Shoulder Extension): Level 2 (Red) Row: Theraband;15 reps Theraband Level (Shoulder Row): Level 2 (Red) External Rotation: Theraband;15 reps Theraband Level (Shoulder External Rotation): Level 2 (Red) Internal Rotation: Theraband;15 reps Theraband Level (Shoulder Internal Rotation): Level 2 (Red) Flexion: Theraband;15 reps Theraband Level (Shoulder Flexion): Level 2 (Red) Abduction: Theraband;15 reps Theraband Level (Shoulder ABduction): Level 2 (Red) Neurological Re-education Exercises Wrist Extension: Strengthening;15 reps (1 pound) Sponges: 17, 19, 16 Hand Gripper with Large Beads: 6 large beads, very difficult to maintain netural wrist with squeezing, wrist flexes automatically     Development of Reach  Development of Reach:  (PNF patterns while completing Saebo R>L )  Grasp and Release    Fine Motor Coordination Fine Motor Coordination: Small Pegboard (shuffling cards, flipping cards over with right hand ) Small Pegboard: grooved pegboard placed and removed without difficlty.        Occupational Therapy Assessment and Plan OT Assessment and Plan Clinical Impression Statement: A:  Patient has max difficulty maintaining neutral or extended wrist while gripping hand gripper, felt sensation along ulnar nerve path with this activity.  Educated patient on ulnar nerve glide for home, as is presenting with ulnar nerve pressure. OT Plan: P:  Folllow up on ulnar nerve glide  HEP.  Continue UE strengthening, GMC training.    Goals Short Term Goals Short Term Goal 1: Patient will be educated on a HEP. Short Term Goal 2: Patient will increase GMC from fair to good - for increased use of his right arm as dominant. Short Term Goal 3: Patient will increase rate of speed with fine motor tasks by completing the nine hole peg test in 40" or less. Short Term Goal 4: Patient will be SBA with all BADLs. Short Term Goal 5: Patient will improve his right grip strength by 15 pounds and pinch strength by 2 pounds for increased ability to open containers. Long Term Goals Time to Complete Long Term Goals: 6 weeks Long Term Goal 1: Patient will use his right arm as dominant with all B/IADLs, work, and leisure activities and complete activities at prior level of Independence.  Long Term Goal 2: Patient will increase GMC from good - to normal for increased use of his right arm as dominant. Long Term Goal 3: Patient will increase rate of speed with fine motor tasks by completing the nine hole peg test in 30" or less. Long Term Goal 4: Patient will improve his right grip strength by 30 pounds and pinch strength by 4 pounds for increased ability to open containers. Long Term Goal 5: Patient will resume driving without modifications.   Problem List Patient Active Problem List   Diagnosis Date Noted  . Difficulty in walking(719.7) 01/25/2013  . CVA (cerebral infarction) 01/22/2013  . Lack of coordination 01/22/2013  . OA (osteoarthritis) of knee 09/05/2011  . Knee pain, right 09/05/2011  . KNEE, ARTHRITIS, DEGEN./OSTEO 01/30/2009  . SHOULDER PAIN 01/16/2009  . KNEE PAIN 01/16/2009  . IMPINGEMENT SYNDROME 01/16/2009  . TENDINITIS, LEFT KNEE 01/16/2009    End  of Session Activity Tolerance: Patient tolerated treatment well General Behavior During Therapy: WFL for tasks assessed/performed OT Plan of Care OT Home Exercise Plan: Ulnar Nerve Glide  OT Patient Instructions: issued  hand out. Consulted and Agree with Plan of Care: Patient  GO    Shirlean Mylar, OTR/L  01/29/2013, 4:18 PM

## 2013-02-01 ENCOUNTER — Ambulatory Visit (HOSPITAL_COMMUNITY)
Admission: RE | Admit: 2013-02-01 | Discharge: 2013-02-01 | Disposition: A | Discharge status: Home / Self Care | Payer: BC Managed Care – PPO | Source: Ambulatory Visit | Attending: Pulmonary Disease | Admitting: Pulmonary Disease

## 2013-02-01 DIAGNOSIS — R279 Unspecified lack of coordination: Secondary | ICD-10-CM

## 2013-02-01 DIAGNOSIS — I639 Cerebral infarction, unspecified: Secondary | ICD-10-CM

## 2013-02-01 DIAGNOSIS — R262 Difficulty in walking, not elsewhere classified: Secondary | ICD-10-CM

## 2013-02-01 NOTE — Progress Notes (Signed)
Physical Therapy Treatment Patient Details  Name: Zachary Murillo MRN: [id number] Date of Birth: 1950/06/29  Today's Date: 02/01/2013 Time: 1100-1145 PT Time Calculation (min): 45 min Charge: Gait 23', NMR 22'  Visit#: 4 of 12  Re-eval: 02/24/13 Assessment Diagnosis: CVA Next MD Visit: Dr. Juanetta Gosling - 12/08, Romeo Apple 02/16/2013  Subjective: Symptoms/Limitations Symptoms: Pt reports that he is tired.  Did his HEP this weekend.  Pain Assessment Currently in Pain?: No/denies  Precautions/Restrictions  Precautions Precautions: Fall  Exercise/Treatments Aerobic Stationary Bike: Nustep 10' hill level 3, resistance 3 SPM average 87, .48 miles Standing Lateral Step Up: Right;10 reps;Hand Hold: 1 Forward Step Up: Right;10 reps;Hand Hold: 1;Step Height: 4" Rocker Board: 2 minutes;Limitations Rocker Board Limitations: R/L SLS: L30", R12" max of 3 Gait Training: Gait training with SPC, cueing for sequencing, equal stride length and UE coordination Other Standing Knee Exercises: alternating hurdles 6in 2RT Other Standing Knee Exercises: 1-15 on blue foam    Physical Therapy Assessment and Plan PT Assessment and Plan Clinical Impression Statement: Began gait training with SPC, vc-ing for sequencing, equalized stride length and UE coordination to improve gait mechanics.  Added stair trainng for LE functional strengthening with noted fatige due to weak quadriceps.  Progressed balance activities this session with min assistance required .  Pt with improve static balance, able to complete SLS without HHA this session.   PT Plan: Continue progressing with current POC for balance, strengthening and gait training with LRAD, SPC to no AD.    Goals Home Exercise Program Pt/caregiver will Perform Home Exercise Program: Independently;For increased strengthening;For improved balance PT Short Term Goals Time to Complete Short Term Goals: 3 weeks PT Short Term Goal 1: Pt will improve his  prorioceptive awareness and begin ambulating with a single point cane.   PT Short Term Goal 1 - Progress: Progressing toward goal PT Short Term Goal 2: Pt will improve his gait velocity to 3.07 ft/sec with a SPC for normalized age range.  PT Long Term Goals Time to Complete Long Term Goals:  (6 weeks) PT Long Term Goal 1: Pt will improve his activity tolerance in order to ambulate x15 minutes independently in an indoor and outdoor environment to continue with a walking program for weight loss.  PT Long Term Goal 2: Pt will improve his Berg Balance Score to 50/56 to decrease risk of falls.  PT Long Term Goal 2 - Progress: Progressing toward goal Long Term Goal 3: Pt will improve his TUG time to less than 13 seconds walking independently in order to safely ambulate in the community.  Long Term Goal 4: Pt will improve his functional strength and activity tolerance 5 times in 8.4 seconds for normalized age range.  Long Term Goal 4 Progress: Progressing toward goal  Problem List Patient Active Problem List   Diagnosis Date Noted  . Difficulty in walking(719.7) 01/25/2013  . CVA (cerebral infarction) 01/22/2013  . Lack of coordination 01/22/2013  . OA (osteoarthritis) of knee 09/05/2011  . Knee pain, right 09/05/2011  . KNEE, ARTHRITIS, DEGEN./OSTEO 01/30/2009  . SHOULDER PAIN 01/16/2009  . KNEE PAIN 01/16/2009  . IMPINGEMENT SYNDROME 01/16/2009  . TENDINITIS, LEFT KNEE 01/16/2009    PT - End of Session Equipment Utilized During Treatment: Gait belt Activity Tolerance: Patient tolerated treatment well;Patient limited by fatigue General Behavior During Therapy: [facility] for tasks assessed/performed  GP    Juel Burrow 02/01/2013, 12:10 PM

## 2013-02-01 NOTE — Progress Notes (Signed)
Occupational Therapy Treatment Patient Details  Name: Zachary Murillo MRN: [id number] Date of Birth: [birth year]  Today's Date: 02/01/2013 Time: 4098-1191 OT Time Calculation (min): 39 min Neuro reeducation 55' Visit#: 4 of 18  Re-eval: 02/19/13    Authorization:    Authorization Time Period:    Authorization Visit#:   of    Subjective Symptoms/Limitations Symptoms: S:  My shoulder is still sore, the glides help a bit. Pain Assessment Currently in Pain?: No/denies Pain Score: 0-No pain  Precautions/Restrictions     Exercise/Treatments Seated Extension: Theraband;15 reps Theraband Level (Shoulder Extension): Level 2 (Red) Row: Theraband;15 reps Theraband Level (Shoulder Row): Level 2 (Red) External Rotation: Theraband;15 reps Theraband Level (Shoulder External Rotation): Level 2 (Red) Internal Rotation: Theraband;15 reps Theraband Level (Shoulder Internal Rotation): Level 2 (Red) Flexion: Theraband;15 reps Theraband Level (Shoulder Flexion): Level 2 (Red) Abduction: Theraband;15 reps Theraband Level (Shoulder ABduction): Level 2 (Red) Other Seated Exercises: proximal shoulder strengthening in seated 10 times each without rest breaks     Neurological Re-education Exercises Sponges: 21, 21, 23 Hand Gripper with Large Beads: 11 large beads, able to maintain wrist extension throughout entire exercise  Weight Bearing Exercises    Development of Reach  Development of Reach:  (Crate r to front to left and back to build GMC)  Grasp and Release    Fine Motor Coordination Small Pegboard: grooved pegboard picking up pegs with tweezers and placing in board, moving board with his non affected arm, mod difficulty with coordination of task        Occupational Therapy Assessment and Plan OT Assessment and Plan Clinical Impression Statement: A: Able to maintain neutral to slightly extended wrist with gripping activities today.  Completed grooved pegboard with tweezers to build  independence with tripod grasp. OT Plan: P:  Improve ability to maintain tripod grasp on writing utensils by 50%   Goals Short Term Goals Short Term Goal 1: Patient will be educated on a HEP. Short Term Goal 1 Progress: Progressing toward goal Short Term Goal 2: Patient will increase GMC from fair to good - for increased use of his right arm as dominant. Short Term Goal 2 Progress: Progressing toward goal Short Term Goal 3: Patient will increase rate of speed with fine motor tasks by completing the nine hole peg test in 40" or less. Short Term Goal 3 Progress: Progressing toward goal Short Term Goal 4: Patient will be SBA with all BADLs. Short Term Goal 4 Progress: Progressing toward goal Short Term Goal 5: Patient will improve his right grip strength by 15 pounds and pinch strength by 2 pounds for increased ability to open containers. Short Term Goal 5 Progress: Progressing toward goal Long Term Goals Time to Complete Long Term Goals: 6 weeks Long Term Goal 1: Patient will use his right arm as dominant with all B/IADLs, work, and leisure activities and complete activities at prior level of Independence.  Long Term Goal 1 Progress: Progressing toward goal Long Term Goal 2: Patient will increase GMC from good - to normal for increased use of his right arm as dominant. Long Term Goal 2 Progress: Progressing toward goal Long Term Goal 3: Patient will increase rate of speed with fine motor tasks by completing the nine hole peg test in 30" or less. Long Term Goal 3 Progress: Progressing toward goal Long Term Goal 4: Patient will improve his right grip strength by 30 pounds and pinch strength by 4 pounds for increased ability to open containers. Long Term Goal 4  Progress: Progressing toward goal Long Term Goal 5: Patient will resume driving without modifications.  Long Term Goal 5 Progress: Progressing toward goal  Problem List Patient Active Problem List   Diagnosis Date Noted  . Difficulty  in walking(719.7) 01/25/2013  . CVA (cerebral infarction) 01/22/2013  . Lack of coordination 01/22/2013  . OA (osteoarthritis) of knee 09/05/2011  . Knee pain, right 09/05/2011  . KNEE, ARTHRITIS, DEGEN./OSTEO 01/30/2009  . SHOULDER PAIN 01/16/2009  . KNEE PAIN 01/16/2009  . IMPINGEMENT SYNDROME 01/16/2009  . TENDINITIS, LEFT KNEE 01/16/2009    End of Session Activity Tolerance: Patient tolerated treatment well General Behavior During Therapy: [facility] for tasks assessed/performed  GO    Shirlean Mylar, OTR/L  02/01/2013, 1:10 PM

## 2013-02-02 ENCOUNTER — Ambulatory Visit (HOSPITAL_COMMUNITY)
Admission: RE | Admit: 2013-02-02 | Discharge: 2013-02-02 | Disposition: A | Discharge status: Home / Self Care | Payer: BC Managed Care – PPO | Source: Ambulatory Visit | Attending: Pulmonary Disease | Admitting: Pulmonary Disease

## 2013-02-02 DIAGNOSIS — I639 Cerebral infarction, unspecified: Secondary | ICD-10-CM

## 2013-02-02 DIAGNOSIS — R279 Unspecified lack of coordination: Secondary | ICD-10-CM

## 2013-02-02 DIAGNOSIS — R262 Difficulty in walking, not elsewhere classified: Secondary | ICD-10-CM

## 2013-02-02 NOTE — Progress Notes (Signed)
Physical Therapy Treatment Patient Details  Name: Zachary Murillo MRN: [id number] Date of Birth: 07-07-1950  Today's Date: 02/02/2013 Time: 1340-1430 PT Time Calculation (min): 50 min Charges: Therex x 20'(1340-1400) NMR x 15'(1402-1417) Gait x 10'(1420-1430)  Visit#: 5 of 12  Re-eval: 02/24/13   Subjective: Symptoms/Limitations Symptoms: Pt reports continued HEP compliance. Pain Assessment Currently in Pain?: No/denies   Exercise/Treatments Aerobic Stationary Bike: Nustep 10' hill level 3, resistance 3 .50 miles Standing Lateral Step Up: Right;10 reps;Hand Hold: 1;Step Height: 6" Forward Step Up: Right;15 reps;Hand Hold: 1;Step Height: 6" Step Down: Right;10 reps;Hand Hold: 0;Step Height: 4" Functional Squat: 10 reps;Limitations Functional Squat Limitations: PT facilitation  Rocker Board: 2 minutes;Limitations Rocker Board Limitations: R/L SLS: L:39" R:12" max of 3 Gait Training: Gait training with SPC, cueing for sequencing, equal stride length and UE coordination Other Standing Knee Exercises: 1-15 on blue foam  Physical Therapy Assessment and Plan PT Assessment and Plan Clinical Impression Statement: Pt continues to progress well. Pt displays improved gait mechanics after multimodal cueing to improve sequencing with SPC. Pt displays improved stability on rocker board. Encouraged pt to use SPC more often at home. PT Plan: Continue progressing with current POC for balance, strengthening and gait training with LRAD, SPC to no AD.    Problem List Patient Active Problem List   Diagnosis Date Noted  . Difficulty in walking(719.7) 01/25/2013  . CVA (cerebral infarction) 01/22/2013  . Lack of coordination 01/22/2013  . OA (osteoarthritis) of knee 09/05/2011  . Knee pain, right 09/05/2011  . KNEE, ARTHRITIS, DEGEN./OSTEO 01/30/2009  . SHOULDER PAIN 01/16/2009  . KNEE PAIN 01/16/2009  . IMPINGEMENT SYNDROME 01/16/2009  . TENDINITIS, LEFT KNEE 01/16/2009    PT - End of  Session Equipment Utilized During Treatment: Gait belt Activity Tolerance: Patient tolerated treatment well;Patient limited by fatigue General Behavior During Therapy: [facility] for tasks assessed/performed  Seth Bake, PTA 02/02/2013, 2:58 PM

## 2013-02-03 ENCOUNTER — Ambulatory Visit (HOSPITAL_COMMUNITY)
Admission: RE | Admit: 2013-02-03 | Discharge: 2013-02-03 | Disposition: A | Discharge status: Home / Self Care | Payer: BC Managed Care – PPO | Source: Ambulatory Visit | Attending: Pulmonary Disease | Admitting: Pulmonary Disease

## 2013-02-03 DIAGNOSIS — I639 Cerebral infarction, unspecified: Secondary | ICD-10-CM

## 2013-02-03 DIAGNOSIS — R279 Unspecified lack of coordination: Secondary | ICD-10-CM

## 2013-02-03 DIAGNOSIS — R262 Difficulty in walking, not elsewhere classified: Secondary | ICD-10-CM

## 2013-02-03 NOTE — Progress Notes (Signed)
Physical Therapy Treatment Patient Details  Name: Zachary Murillo MRN: [id number] Date of Birth: [birth year]  Today's Date: 02/03/2013 Time: 4098-1191 PT Time Calculation (min): 40 min Charge: Gait 1115-1123, NMR 1123-1135, TE 4782-9562  Visit#: 6 of 12  Re-eval: 02/24/13 Assessment Diagnosis: CVA Next MD Visit: Dr. Juanetta Gosling - 12/08, Romeo Apple 02/16/2013  Subjective: Symptoms/Limitations Symptoms: Pt entered dept ambulating with [facility] today, reports walking is getting easier, still have most difficulty with activity tolerance. Pain Assessment Currently in Pain?: No/denies  Precautions/Restrictions  Precautions Precautions: Fall  Exercise/Treatments Aerobic Stationary Bike: Nustep 10' hill level 3, resistance 4, SPM 85 Standing Heel Raises: 15 reps;Limitations Heel Raises Limitations: toe raises 10x  Lateral Step Up: Right;Hand Hold: 1;Step Height: 6";15 reps Forward Step Up: Right;15 reps;Hand Hold: 1;Step Height: 6" Step Down: Right;15 reps;Step Height: 4";Hand Hold: 1 Wall Squat: 10 reps;3 seconds Rocker Board: 2 minutes;Limitations Rocker Board Limitations: R/Land A/P Gait Training: Gait training with SPC, cueing for sequencing, equal stride length and UE coordination Other Standing Knee Exercises: alternating hurdles 6in 2RT Other Standing Knee Exercises: Postural strengthening with red theraband extension and rows 10x    Physical Therapy Assessment and Plan PT Assessment and Plan Clinical Impression Statement: Pt progressing well towards goals, improved gait mechanics with less cueing to improve sequencing with SPC and for posture with gait.  Added postural strengthneing exercises with min cueing forform and technique.  Progressed to wall squats for LE strengthening with visible musculature fatigue. PT Plan: Continue progressing with current POC for balance, strengthening and gait training with LRAD, SPC to no AD.    Goals Home Exercise Program Pt/caregiver will  Perform Home Exercise Program: Independently;For increased strengthening;For improved balance PT Short Term Goals Time to Complete Short Term Goals: 3 weeks PT Short Term Goal 1: Pt will improve his prorioceptive awareness and begin ambulating with a single point cane.   PT Short Term Goal 1 - Progress: Progressing toward goal PT Short Term Goal 2: Pt will improve his gait velocity to 3.07 ft/sec with a SPC for normalized age range.  PT Long Term Goals Time to Complete Long Term Goals:  (6 weeks) PT Long Term Goal 1: Pt will improve his activity tolerance in order to ambulate x15 minutes independently in an indoor and outdoor environment to continue with a walking program for weight loss.  PT Long Term Goal 2: Pt will improve his Berg Balance Score to 50/56 to decrease risk of falls.  PT Long Term Goal 2 - Progress: Progressing toward goal Long Term Goal 3: Pt will improve his TUG time to less than 13 seconds walking independently in order to safely ambulate in the community.  Long Term Goal 4: Pt will improve his functional strength and activity tolerance 5 times in 8.4 seconds for normalized age range.  Long Term Goal 4 Progress: Progressing toward goal  Problem List Patient Active Problem List   Diagnosis Date Noted  . Difficulty in walking(719.7) 01/25/2013  . CVA (cerebral infarction) 01/22/2013  . Lack of coordination 01/22/2013  . OA (osteoarthritis) of knee 09/05/2011  . Knee pain, right 09/05/2011  . KNEE, ARTHRITIS, DEGEN./OSTEO 01/30/2009  . SHOULDER PAIN 01/16/2009  . KNEE PAIN 01/16/2009  . IMPINGEMENT SYNDROME 01/16/2009  . TENDINITIS, LEFT KNEE 01/16/2009    PT - End of Session Equipment Utilized During Treatment: Gait belt Activity Tolerance: Patient limited by fatigue;Patient tolerated treatment well General Behavior During Therapy: [facility] for tasks assessed/performed  GP    Juel Burrow 02/03/2013, 12:01 PM

## 2013-02-03 NOTE — Progress Notes (Signed)
Occupational Therapy Treatment Patient Details  Name: Zachary Murillo MRN: [id number] Date of Birth: 11-06-1950  Today's Date: 02/03/2013 Time: 1026-1100 OT Time Calculation (min): 34 min Neuro reeducation 44' Visit#: 5 of 18  Re-eval: 02/19/13    Subjective  S:  Im doing ok, I still feel like my arm will just give away sometimes. Pain Assessment Currently in Pain?: No/denies Pain Score: 0-No pain  Precautions/Restrictions     Exercise/Treatments   ROM / Strengthening / Isometric Strengthening UBE (Upper Arm Bike): 3' and 3' at 1.0 for sustained activity tolerance and scapular stability improvement    Hand Exercises Theraputty - Locate Pegs: 10 beads and cleaned excess putty off of beads Hand Gripper with Large Beads: 11 large beads, able to maintain wrist extension throughout entire exercise with forearm in neutral vs pronated  Sponges: 21, 24, 31 Small Pegboard: grooved pegboard picking up pegs with tweezers and placing in board, moving board with his non affected arm, min-mod difficulty and able to manipulate pegs more so than moving pegboard this date.   Other Hand Exercises: picked max resist sponges up with purple clothespin.        Occupational Therapy Assessment and Plan OT Assessment and Plan Clinical Impression Statement: A:  Patient able to maintain neutral forearm and extended wrist with gripping activity.  added UBE for shoulder staiblity.   OT Plan: P:  Attempt medium beads with gripping activity.   Goals Short Term Goals Short Term Goal 1: Patient will be educated on a HEP. Short Term Goal 2: Patient will increase GMC from fair to good - for increased use of his right arm as dominant. Short Term Goal 3: Patient will increase rate of speed with fine motor tasks by completing the nine hole peg test in 40" or less. Short Term Goal 4: Patient will be SBA with all BADLs. Short Term Goal 5: Patient will improve his right grip strength by 15 pounds and pinch  strength by 2 pounds for increased ability to open containers. Long Term Goals Time to Complete Long Term Goals: 6 weeks Long Term Goal 1: Patient will use his right arm as dominant with all B/IADLs, work, and leisure activities and complete activities at prior level of Independence.  Long Term Goal 2: Patient will increase GMC from good - to normal for increased use of his right arm as dominant. Long Term Goal 3: Patient will increase rate of speed with fine motor tasks by completing the nine hole peg test in 30" or less. Long Term Goal 4: Patient will improve his right grip strength by 30 pounds and pinch strength by 4 pounds for increased ability to open containers. Long Term Goal 5: Patient will resume driving without modifications.   Problem List Patient Active Problem List   Diagnosis Date Noted  . Difficulty in walking(719.7) 01/25/2013  . CVA (cerebral infarction) 01/22/2013  . Lack of coordination 01/22/2013  . OA (osteoarthritis) of knee 09/05/2011  . Knee pain, right 09/05/2011  . KNEE, ARTHRITIS, DEGEN./OSTEO 01/30/2009  . SHOULDER PAIN 01/16/2009  . KNEE PAIN 01/16/2009  . IMPINGEMENT SYNDROME 01/16/2009  . TENDINITIS, LEFT KNEE 01/16/2009    End of Session Activity Tolerance: Patient tolerated treatment well General Behavior During Therapy: [facility] for tasks assessed/performed  GO    Shirlean Mylar, OTR/L  02/03/2013, 1:13 PM

## 2013-02-08 ENCOUNTER — Ambulatory Visit (HOSPITAL_COMMUNITY)
Admission: RE | Admit: 2013-02-08 | Discharge: 2013-02-08 | Disposition: A | Discharge status: Home / Self Care | Payer: BC Managed Care – PPO | Source: Ambulatory Visit | Attending: Pulmonary Disease | Admitting: Pulmonary Disease

## 2013-02-08 DIAGNOSIS — I69998 Other sequelae following unspecified cerebrovascular disease: Secondary | ICD-10-CM | POA: Insufficient documentation

## 2013-02-08 DIAGNOSIS — IMO0001 Reserved for inherently not codable concepts without codable children: Secondary | ICD-10-CM | POA: Insufficient documentation

## 2013-02-08 DIAGNOSIS — R262 Difficulty in walking, not elsewhere classified: Secondary | ICD-10-CM

## 2013-02-08 DIAGNOSIS — R279 Unspecified lack of coordination: Secondary | ICD-10-CM

## 2013-02-08 DIAGNOSIS — I69993 Ataxia following unspecified cerebrovascular disease: Secondary | ICD-10-CM | POA: Insufficient documentation

## 2013-02-08 DIAGNOSIS — M6281 Muscle weakness (generalized): Secondary | ICD-10-CM | POA: Insufficient documentation

## 2013-02-08 DIAGNOSIS — I639 Cerebral infarction, unspecified: Secondary | ICD-10-CM

## 2013-02-08 NOTE — Progress Notes (Signed)
Physical Therapy Treatment Patient Details  Name: Zachary Murillo MRN: [id number] Date of Birth: [birth year]  Today's Date: 02/08/2013 Time: 7829-5621 PT Time Calculation (min): 48 min Charge: Gait 1348-1356, TE 3086-5784  Visit#: 7 of 12  Re-eval: 02/24/13 Assessment Diagnosis: CVA Next MD Visit: Dr. Juanetta Gosling - 12/08, Romeo Apple 02/16/2013  Subjective: Symptoms/Limitations Symptoms: Pt stated everything is getting easier, still has difficulty with activity tolerance. Pain Assessment Currently in Pain?: No/denies  Precautions/Restrictions  Precautions Precautions: Fall  Exercise/Treatments Aerobic Stationary Bike: Nustep 10' hill level 3, resistance 4, SPM goal 100 Standing Heel Raises: 15 reps;Limitations Heel Raises Limitations: toe raises 10x  Lateral Step Up: Right;Hand Hold: 1;Step Height: 6";15 reps Forward Step Up: Right;15 reps;Hand Hold: 1;Step Height: 6" Step Down: Right;10 reps;Hand Hold: 1;Step Height: 6" Functional Squat: 15 reps;Limitations Functional Squat Limitations: PT facilitation  Rocker Board: 2 minutes;Limitations Rocker Board Limitations: R/Land A/P Gait Training: Gait training with SPC, cueing for sequencing, equal stride length and UE coordination Other Standing Knee Exercises: alternating hurdles 6 and 12in 2RT Other Standing Knee Exercises: 10 STS no HHA Seated Other Seated Knee Exercises: PFC for core strengthening to assist with LBP and incontience 10x 5"    Physical Therapy Assessment and Plan PT Assessment and Plan Clinical Impression Statement: Pt progressing well towards goals, improved gait mechanics with SPC with min cueing required for posture this session.  Increased difficutly with increase to 12 in hurdles with min A required for LOB episodes.  Pt limited by fatigue.  Pt c/o LBP with toe raises, pt instructted pelvic floor contractions to assist with core strengthening and help with incontience.   PT Plan: Continue progressing with  current POC for balance, strengthening and gait training with LRAD, SPC to no AD.    Goals Home Exercise Program Pt/caregiver will Perform Home Exercise Program: Independently;For increased strengthening;For improved balance PT Short Term Goals Time to Complete Short Term Goals: 3 weeks PT Short Term Goal 1: Pt will improve his prorioceptive awareness and begin ambulating with a single point cane.   PT Short Term Goal 1 - Progress: Progressing toward goal PT Short Term Goal 2: Pt will improve his gait velocity to 3.07 ft/sec with a SPC for normalized age range.  PT Short Term Goal 2 - Progress: Progressing toward goal PT Long Term Goals Time to Complete Long Term Goals:  (6 weeks) PT Long Term Goal 1: Pt will improve his activity tolerance in order to ambulate x15 minutes independently in an indoor and outdoor environment to continue with a walking program for weight loss.  PT Long Term Goal 1 - Progress: Progressing toward goal PT Long Term Goal 2: Pt will improve his Berg Balance Score to 50/56 to decrease risk of falls.  Long Term Goal 3: Pt will improve his TUG time to less than 13 seconds walking independently in order to safely ambulate in the community.  Long Term Goal 4: Pt will improve his functional strength and activity tolerance 5 times in 8.4 seconds for normalized age range.  Long Term Goal 4 Progress: Progressing toward goal  Problem List Patient Active Problem List   Diagnosis Date Noted  . Difficulty in walking(719.7) 01/25/2013  . CVA (cerebral infarction) 01/22/2013  . Lack of coordination 01/22/2013  . OA (osteoarthritis) of knee 09/05/2011  . Knee pain, right 09/05/2011  . KNEE, ARTHRITIS, DEGEN./OSTEO 01/30/2009  . SHOULDER PAIN 01/16/2009  . KNEE PAIN 01/16/2009  . IMPINGEMENT SYNDROME 01/16/2009  . TENDINITIS, LEFT KNEE 01/16/2009  PT - End of Session Equipment Utilized During Treatment: Gait belt Activity Tolerance: Patient limited by fatigue;Patient  tolerated treatment well General Behavior During Therapy: [facility] for tasks assessed/performed  GP    Juel Burrow 02/08/2013, 4:11 PM

## 2013-02-08 NOTE — Progress Notes (Signed)
Occupational Therapy Treatment Patient Details  Name: Zachary Murillo MRN: [id number] Date of Birth: May 20, 1950  Today's Date: 02/08/2013 Time: 4098-1191 OT Time Calculation (min): 46 min NeuroMuscular Re-education 1302-1348 (46')  Visit#: 6 of 18  Re-eval: 02/19/13     Subjective Symptoms/Limitations Symptoms: S:  I can use that hand without thinking about it as much.  Pain Assessment Currently in Pain?: No/denies   Exercise/Treatments ROM / Strengthening / Isometric Strengthening UBE (Upper Arm Bike): 3' and 3' at 1.0 for sustained activity tolerance and scapular stability improvement     Hand Exercises Theraputty: Flatten;Roll;Grip (press with 3/4" dowel x 25) Theraputty - Flatten: red Theraputty - Roll: red Theraputty - Grip: red Hand Gripper with Large Beads: 10 medium pegs, able to maintain wrist extension however cont with pronated grasp to maintain peg  Small Pegboard: small colored pegboard to attain and place and remove pegs with use of tweezers, fair manipulation of tweezers to place.  Other Hand Exercises: picked max resist sponges up with purple clothespin x 12 with supinating movement to place in container while maintaining neutral wrist.         Occupational Therapy Assessment and Plan OT Assessment and Plan Clinical Impression Statement: A:  Patient cont with pronated hand for gripping activities this date while maintaining wrist in neutral.  Demos good success with medium beds  and gripper this date.  Able to complete putty press this date with increased complaint of fatigue in shoulder/hand following.  OT Plan: P: UE sustained activities for endurance of proximal RUE   Goals Short Term Goals Short Term Goal 1: Patient will be educated on a HEP. Short Term Goal 1 Progress: Progressing toward goal Short Term Goal 2: Patient will increase GMC from fair to good - for increased use of his right arm as dominant. Short Term Goal 2 Progress: Progressing toward  goal Short Term Goal 3: Patient will increase rate of speed with fine motor tasks by completing the nine hole peg test in 40" or less. Short Term Goal 3 Progress: Progressing toward goal Short Term Goal 4: Patient will be SBA with all BADLs. Short Term Goal 4 Progress: Progressing toward goal Short Term Goal 5: Patient will improve his right grip strength by 15 pounds and pinch strength by 2 pounds for increased ability to open containers. Short Term Goal 5 Progress: Progressing toward goal Long Term Goals Long Term Goal 1: Patient will use his right arm as dominant with all B/IADLs, work, and leisure activities and complete activities at prior level of Independence.  Long Term Goal 1 Progress: Progressing toward goal Long Term Goal 2: Patient will increase GMC from good - to normal for increased use of his right arm as dominant. Long Term Goal 2 Progress: Progressing toward goal Long Term Goal 3: Patient will increase rate of speed with fine motor tasks by completing the nine hole peg test in 30" or less. Long Term Goal 3 Progress: Progressing toward goal Long Term Goal 4: Patient will improve his right grip strength by 30 pounds and pinch strength by 4 pounds for increased ability to open containers. Long Term Goal 4 Progress: Progressing toward goal Long Term Goal 5: Patient will resume driving without modifications.  Long Term Goal 5 Progress: Progressing toward goal  Problem List Patient Active Problem List   Diagnosis Date Noted  . Difficulty in walking(719.7) 01/25/2013  . CVA (cerebral infarction) 01/22/2013  . Lack of coordination 01/22/2013  . OA (osteoarthritis) of knee 09/05/2011  .  Knee pain, right 09/05/2011  . KNEE, ARTHRITIS, DEGEN./OSTEO 01/30/2009  . SHOULDER PAIN 01/16/2009  . KNEE PAIN 01/16/2009  . IMPINGEMENT SYNDROME 01/16/2009  . TENDINITIS, LEFT KNEE 01/16/2009    End of Session Activity Tolerance: Treatment limited secondary to medical complications  (Comment) General Behavior During Therapy: [facility] for tasks assessed/performed  GO    Velora Mediate, OTR/L  02/08/2013, 1:51 PM

## 2013-02-10 ENCOUNTER — Ambulatory Visit (HOSPITAL_COMMUNITY)
Admission: RE | Admit: 2013-02-10 | Discharge: 2013-02-10 | Disposition: A | Discharge status: Home / Self Care | Payer: BC Managed Care – PPO | Source: Ambulatory Visit | Attending: Pulmonary Disease | Admitting: Pulmonary Disease

## 2013-02-10 DIAGNOSIS — R279 Unspecified lack of coordination: Secondary | ICD-10-CM

## 2013-02-10 DIAGNOSIS — I639 Cerebral infarction, unspecified: Secondary | ICD-10-CM

## 2013-02-10 NOTE — Progress Notes (Signed)
Physical Therapy Treatment Patient Details  Name: Zachary Murillo MRN: [id number] Date of Birth: 22-Apr-1950  Today's Date: 02/10/2013 Time: 4098-1191 PT Time Calculation (min): 46 min Visit#: 8 of 12  Re-eval: 02/24/13 Charges:  therex 44 minutes   Subjective: Symptoms/Limitations Symptoms: Pt states he is doing well today.  Reports no pain; continues to use SPC. Pain Assessment Currently in Pain?: No/denies   Exercise/Treatments Aerobic Stationary Bike: Nustep 10' hill level 3, resistance 4, SPM goal 100 Standing Heel Raises: 15 reps;Limitations Heel Raises Limitations: toe raises 15x  Lateral Step Up: Right;Hand Hold: 1;Step Height: 6";15 reps Forward Step Up: Right;15 reps;Hand Hold: 1;Step Height: 6" Step Down: Right;10 reps;Hand Hold: 1;Step Height: 6" Functional Squat: 15 reps;Limitations Functional Squat Limitations: PT facilitation  Rocker Board: 2 minutes;Limitations Rocker Board Limitations: R/Land A/P and balance in middle Other Standing Knee Exercises: balance beam 2RT no AD Other Standing Knee Exercises: 10 STS no HHA      Physical Therapy Assessment and Plan PT Assessment and Plan PT Assessment:  Pt without need for rest break during session today.  Progressed to balance beam and central balancing on rockerboard without UE's.  Pt requires constant cues for posture during therex as well as tactile cues from therapist to prompt reduction of forward bent posture. PT Plan: Continue progressing with current POC for balance, strengthening and gait training with LRAD, SPC to no AD.     Problem List Patient Active Problem List   Diagnosis Date Noted  . Difficulty in walking(719.7) 01/25/2013  . CVA (cerebral infarction) 01/22/2013  . Lack of coordination 01/22/2013  . OA (osteoarthritis) of knee 09/05/2011  . Knee pain, right 09/05/2011  . KNEE, ARTHRITIS, DEGEN./OSTEO 01/30/2009  . SHOULDER PAIN 01/16/2009  . KNEE PAIN 01/16/2009  . IMPINGEMENT SYNDROME  01/16/2009  . TENDINITIS, LEFT KNEE 01/16/2009    PT - End of Session Equipment Utilized During Treatment: Gait belt Activity Tolerance: Patient limited by fatigue;Patient tolerated treatment well General Behavior During Therapy: [facility] for tasks assessed/performed   Lurena Nida, PTA/CLT 02/10/2013, 2:48 PM

## 2013-02-10 NOTE — Progress Notes (Signed)
Occupational Therapy Treatment Patient Details  Name: Zachary Murillo MRN: [id number] Date of Birth: [birth year]  Today's Date: 02/10/2013 Time: 4098-1191 OT Time Calculation (min): 45 min Neuro reed 45'  Visit#: 7 of 18  Re-eval: 02/19/13     Subjective Symptoms/Limitations Symptoms: S:  My handwritings getting better, it could still use some work.  Pain Assessment Currently in Pain?: No/denies  Precautions/Restrictions     Exercise/Treatments Hand Exercises Small Pegboard: Purdue pegboard utilizing tweezers,  placed 10 pegs, discs, and cuffs in and then out.   Other Hand Exercises: handwriting  - wrote 3 paragraphs with slim pen and on paper with1/4" writing space.  Hand significantly fatigued by end of session.  on a scale of 1-10, patient was 9/10 fatigued by the time he finished writing activity.        Occupational Therapy Assessment and Plan OT Assessment and Plan Clinical Impression Statement: A:  Handwriting with good legibility and 9/10 fatigue. OT Plan: P:  Improve sustained activity tolerance with fine motor tasks.     Goals Short Term Goals Short Term Goal 1: Patient will be educated on a HEP. Short Term Goal 2: Patient will increase GMC from fair to good - for increased use of his right arm as dominant. Short Term Goal 3: Patient will increase rate of speed with fine motor tasks by completing the nine hole peg test in 40" or less. Short Term Goal 4: Patient will be SBA with all BADLs. Short Term Goal 5: Patient will improve his right grip strength by 15 pounds and pinch strength by 2 pounds for increased ability to open containers. Long Term Goals Long Term Goal 1: Patient will use his right arm as dominant with all B/IADLs, work, and leisure activities and complete activities at prior level of Independence.  Long Term Goal 2: Patient will increase GMC from good - to normal for increased use of his right arm as dominant. Long Term Goal 3: Patient will increase  rate of speed with fine motor tasks by completing the nine hole peg test in 30" or less. Long Term Goal 4: Patient will improve his right grip strength by 30 pounds and pinch strength by 4 pounds for increased ability to open containers. Long Term Goal 5: Patient will resume driving without modifications.   Problem List Patient Active Problem List   Diagnosis Date Noted  . Difficulty in walking(719.7) 01/25/2013  . CVA (cerebral infarction) 01/22/2013  . Lack of coordination 01/22/2013  . OA (osteoarthritis) of knee 09/05/2011  . Knee pain, right 09/05/2011  . KNEE, ARTHRITIS, DEGEN./OSTEO 01/30/2009  . SHOULDER PAIN 01/16/2009  . KNEE PAIN 01/16/2009  . IMPINGEMENT SYNDROME 01/16/2009  . TENDINITIS, LEFT KNEE 01/16/2009    End of Session Activity Tolerance: Patient tolerated treatment well General Behavior During Therapy: [facility] for tasks assessed/performed  GO    Jacqualine Code 02/10/2013, 3:30 PM

## 2013-02-12 ENCOUNTER — Ambulatory Visit (HOSPITAL_COMMUNITY)
Admission: RE | Admit: 2013-02-12 | Discharge: 2013-02-12 | Disposition: A | Discharge status: Home / Self Care | Payer: BC Managed Care – PPO | Source: Ambulatory Visit

## 2013-02-12 ENCOUNTER — Ambulatory Visit (HOSPITAL_COMMUNITY): Payer: BC Managed Care – PPO | Admitting: Occupational Therapy

## 2013-02-12 ENCOUNTER — Telehealth (HOSPITAL_COMMUNITY): Payer: Self-pay

## 2013-02-12 DIAGNOSIS — I639 Cerebral infarction, unspecified: Secondary | ICD-10-CM

## 2013-02-12 DIAGNOSIS — R262 Difficulty in walking, not elsewhere classified: Secondary | ICD-10-CM

## 2013-02-12 DIAGNOSIS — R279 Unspecified lack of coordination: Secondary | ICD-10-CM

## 2013-02-12 NOTE — Progress Notes (Signed)
Physical Therapy Re-evaluation/Treatment  Patient Details  Name: Zachary Murillo MRN: [id number] Date of Birth: 27-Sep-1950  Today's Date: 02/12/2013 Time: 1478-2956 PT Time Calculation (min): 59 min Charge:TE 2130-8657, Physical performance testing 1423-1438, MMT 1438-1445              Visit#: 9 of 12  Re-eval: 02/24/13 Assessment Diagnosis: CVA Next MD Visit: Dr. Juanetta Gosling - 12/08, Romeo Apple 02/16/2013  Subjective Symptoms/Limitations Symptoms: Pt reports increased ease with functional activiteis, stated difficulty wtth walking with foot dropping.  Has been showing and doing ADLs independently. How long can you sit comfortably?: no difficulty How long can you stand comfortably?: 20 minutes (was 3 minutes) How long can you walk comfortably?: 10 minutes (was 3 minutes ) Pain Assessment Currently in Pain?: No/denies  Precautions/Restrictions  Precautions Precautions: Fall  Assessment RLE Strength Right Hip Flexion:  (4+/5 was 4/5) Right Hip Extension: 4/5 (was 4/5) Right Hip ABduction:  (4+/5was 4/5) Right Hip ADduction:  (4+/5 was 4/5) Right Knee Flexion:  (4+/5 was 4/5) Right Knee Extension:  (4+/5 was 4/5)  Exercise/Treatments Mobility/Balance  Transfers Transfers: Sit to Stand Sit to Stand: 7: Independent;Other (comment) (10 times in 30 seconds) Ambulation/Gait Ambulation/Gait: Yes Ambulation/Gait Assistance: 7: Independent;5: Supervision Ambulation/Gait Assistance Details: no AD Ambulation Distance (Feet): 98 Feet Assistive device: None Gait velocity: 1.69 ft/sec Berg Balance Test Sit to Stand: Able to stand without using hands and stabilize independently Standing Unsupported: Able to stand safely 2 minutes Sitting with Back Unsupported but Feet Supported on Floor or Stool: Able to sit safely and securely 2 minutes Stand to Sit: Sits safely with minimal use of hands Transfers: Able to transfer safely, minor use of hands Standing Unsupported with Eyes Closed: Able  to stand 10 seconds safely Standing Ubsupported with Feet Together: Able to place feet together independently and stand 1 minute safely From Standing, Reach Forward with Outstretched Arm: Can reach forward >12 cm safely (5") From Standing Position, Pick up Object from Floor: Able to pick up shoe safely and easily From Standing Position, Turn to Look Behind Over each Shoulder: Looks behind from both sides and weight shifts well Turn 360 Degrees: Able to turn 360 degrees safely one side only in 4 seconds or less Standing Unsupported, Alternately Place Feet on Step/Stool: Able to stand independently and complete 8 steps >20 seconds Standing Unsupported, One Foot in Front: Able to take small step independently and hold 30 seconds Standing on One Leg: Able to lift leg independently and hold equal to or more than 3 seconds Total Score: 49 Timed Up and Go Test TUG: Normal TUG Normal TUG (seconds): 17 (was 31)   Standing Lateral Step Up: Right;Hand Hold: 1;Step Height: 6";15 reps Forward Step Up: Right;15 reps;Hand Hold: 1;Step Height: 6" Step Down: Right;10 reps;Hand Hold: 1;Step Height: 6" Functional Squat: 15 reps;Limitations Functional Squat Limitations: PT facilitation  Rocker Board: 2 minutes;Limitations Rocker Board Limitations: R/Land A/P and balance in middle Gait Training: Gait training no AD, cueing for posture and equalized stance phase Other Standing Knee Exercises: Postural strengthening green tband 15x extension, rows, retraction, adduction Bil UE Other Standing Knee Exercises: 10 STS no HHA 30 seconds    Physical Therapy Assessment and Plan PT Assessment and Plan Clinical Impression Statement: Re-assessment complete prior MD apt on 02/15/2013 wtih the following findings:  Pt is making significant gains towards STG and LTGs.  Pt has met 1/2 STG and progressing towards all LTGs.  Pt independent with HEP and able to demonstrate appropriate technique with all  exercises.  Improved  proprioception with ability to ambulate with SPC indoor and outdoor settings safely.  Increased gait velocity to 1.63 ft/sec and TUG time of 17", test complete with no AD this session.  Improved LE strength for all musculature.  Improved BERG balance testing to 49/56.  Improving activtiy tolerance and independence at home with reports of increased functional activities at home.   PT Plan: Recommend continuing OPPT for 4 more weeks to address balance, strengthening and gait training wtih LRAD to increase gait velocity to reach goals unmet.      Goals Home Exercise Program Pt/caregiver will Perform Home Exercise Program: Independently;For increased strengthening;For improved balance PT Short Term Goals Time to Complete Short Term Goals: 3 weeks PT Short Term Goal 1: Pt will improve his prorioceptive awareness and begin ambulating with a single point cane.   PT Short Term Goal 1 - Progress: Met PT Short Term Goal 2: Pt will improve his gait velocity to 3.07 ft/sec with a SPC for normalized age range.  PT Short Term Goal 2 - Progress: Progressing toward goal (1.63 ft/sec with no AD) PT Long Term Goals Time to Complete Long Term Goals:  (6 weeks) PT Long Term Goal 1: Pt will improve his activity tolerance in order to ambulate x15 minutes independently in an indoor and outdoor environment to continue with a walking program for weight loss.  PT Long Term Goal 1 - Progress: Progressing toward goal PT Long Term Goal 2: Pt will improve his Berg Balance Score to 50/56 to decrease risk of falls.  PT Long Term Goal 2 - Progress: Progressing toward goal Long Term Goal 3: Pt will improve his TUG time to less than 13 seconds walking independently in order to safely ambulate in the community.  Long Term Goal 3 Progress: Progressing toward goal (17" max of 3) Long Term Goal 4: Pt will improve his functional strength and activity tolerance 5 times in 8.4 seconds for normalized age range.  Long Term Goal 4  Progress: Progressing toward goal  Problem List Patient Active Problem List   Diagnosis Date Noted  . Difficulty in walking(719.7) 01/25/2013  . CVA (cerebral infarction) 01/22/2013  . Lack of coordination 01/22/2013  . OA (osteoarthritis) of knee 09/05/2011  . Knee pain, right 09/05/2011  . KNEE, ARTHRITIS, DEGEN./OSTEO 01/30/2009  . SHOULDER PAIN 01/16/2009  . KNEE PAIN 01/16/2009  . IMPINGEMENT SYNDROME 01/16/2009  . TENDINITIS, LEFT KNEE 01/16/2009    PT - End of Session Equipment Utilized During Treatment: Gait belt Activity Tolerance: Patient limited by fatigue;Patient tolerated treatment well General Behavior During Therapy: [facility] for tasks assessed/performed  GP    Juel Burrow 02/12/2013, 5:52 PM  Physician Documentation Your signature is required to indicate approval of the treatment plan as stated above.  Please sign and either send electronically or make a copy of this report for your files and return this physician signed original.   Please mark one 1.__approve of plan  2. ___approve of plan with the following conditions.   ______________________________                                                          _____________________ Physician Signature  Date  

## 2013-02-15 ENCOUNTER — Ambulatory Visit (HOSPITAL_COMMUNITY)
Admission: RE | Admit: 2013-02-15 | Discharge: 2013-02-15 | Disposition: A | Discharge status: Home / Self Care | Payer: BC Managed Care – PPO | Source: Ambulatory Visit | Attending: Pulmonary Disease | Admitting: Pulmonary Disease

## 2013-02-15 DIAGNOSIS — R262 Difficulty in walking, not elsewhere classified: Secondary | ICD-10-CM

## 2013-02-15 DIAGNOSIS — R279 Unspecified lack of coordination: Secondary | ICD-10-CM

## 2013-02-15 DIAGNOSIS — I639 Cerebral infarction, unspecified: Secondary | ICD-10-CM

## 2013-02-15 NOTE — Progress Notes (Signed)
Occupational Therapy Treatment Patient Details  Name: Zachary Murillo MRN: [id number] Date of Birth: 25-Nov-1950  Today's Date: 02/15/2013 Time: 8295-6213 OT Time Calculation (min): 45 min Neuro reeducation 45'  Visit#: 8 of 18  Re-eval: 02/19/13     Subjective S:  Dr. Juanetta Gosling said I could drive again.  Precautions/Restrictions   progress as tolerated  Exercise/Treatments ROM / Strengthening / Isometric Strengthening UBE (Upper Arm Bike): 3' and 3' 2.5   Wrist Exercises Wrist Flexion: Strengthening;15 reps (3 pounds) Wrist Extension: Strengthening;15 reps (3 pounds) Wrist Radial Deviation: Strengthening;15 reps (3 pounds) Other wrist exercises: wrist stabilization with green tputty and narrow dowel rod    Theraputty: Flatten;Roll;Grip Theraputty - Flatten: green Theraputty - Roll: green Theraputty - Grip: green  (forearm supinated and pronated ) Theraputty - Locate Pegs: 10 beads and cleaned excess putty off of beads  Hand Exercises Theraputty: Flatten;Roll;Grip Theraputty - Flatten: green Theraputty - Roll: green Theraputty - Grip: green  (forearm supinated and pronated ) Theraputty - Locate Pegs: 10 beads and cleaned excess putty off of beads Other Hand Exercises: eggsercizer medium blue to buildthumb MCPJ flexion (simulating pushing gearshift button in with right thumb) 15 times        Occupational Therapy Assessment and Plan OT Assessment and Plan Clinical Impression Statement: A:  Added wrist stability exercises for imporved ability to maintain grip on plates for extended period of time.  OT Plan: P:  Follow up on driving and wrist stability exercises.   Goals Short Term Goals Short Term Goal 1: Patient will be educated on a HEP. Short Term Goal 1 Progress: Progressing toward goal Short Term Goal 2: Patient will increase GMC from fair to good - for increased use of his right arm as dominant. Short Term Goal 2 Progress: Progressing toward goal Short Term Goal  3: Patient will increase rate of speed with fine motor tasks by completing the nine hole peg test in 40" or less. Short Term Goal 3 Progress: Progressing toward goal Short Term Goal 4: Patient will be SBA with all BADLs. Short Term Goal 4 Progress: Progressing toward goal Short Term Goal 5: Patient will improve his right grip strength by 15 pounds and pinch strength by 2 pounds for increased ability to open containers. Short Term Goal 5 Progress: Progressing toward goal Long Term Goals Long Term Goal 1: Patient will use his right arm as dominant with all B/IADLs, work, and leisure activities and complete activities at prior level of [place].  Long Term Goal 1 Progress: Progressing toward goal Long Term Goal 2: Patient will increase GMC from good - to normal for increased use of his right arm as dominant. Long Term Goal 2 Progress: Progressing toward goal Long Term Goal 3: Patient will increase rate of speed with fine motor tasks by completing the nine hole peg test in 30" or less. Long Term Goal 3 Progress: Progressing toward goal Long Term Goal 4: Patient will improve his right grip strength by 30 pounds and pinch strength by 4 pounds for increased ability to open containers. Long Term Goal 4 Progress: Progressing toward goal Long Term Goal 5: Patient will resume driving without modifications.  Long Term Goal 5 Progress: Progressing toward goal  Problem List Patient Active Problem List   Diagnosis Date Noted  . Difficulty in walking(719.7) 01/25/2013  . CVA (cerebral infarction) 01/22/2013  . Lack of coordination 01/22/2013  . OA (osteoarthritis) of knee 09/05/2011  . Knee pain, right 09/05/2011  . KNEE, ARTHRITIS, DEGEN./OSTEO 01/30/2009  .  SHOULDER PAIN 01/16/2009  . KNEE PAIN 01/16/2009  . IMPINGEMENT SYNDROME 01/16/2009  . TENDINITIS, LEFT KNEE 01/16/2009    End of Session Activity Tolerance: Patient tolerated treatment well General Behavior During Therapy: [facility] for tasks  assessed/performed  GO    Shirlean Mylar, OTR/L  02/15/2013, 1:51 PM

## 2013-02-15 NOTE — Progress Notes (Signed)
Physical Therapy Treatment Patient Details  Name: Zachary Murillo MRN: [id number] Date of Birth: [birth year]  Today's Date: 02/15/2013 Time: 4332-9518 PT Time Calculation (min): 38 min Charges:  NMR: 1353-1420 TE: 1420-1431 Visit#: 10 of 12  Re-eval: 03/15/13  Subjective: Symptoms/Limitations Symptoms: He reports that he is now able to drive.  He feels that he still have a foot drop and is walking slow.  Precautions/Restrictions     Exercise/Treatments Aerobic Tread Mill: gait training: 8 minutes 0.53 cyc/sec w/cueing for leg length Machines for Strengthening Cybex Knee Extension: 2.5 PL x30 reps Cybex Knee Flexion: 5 PL Bil LE 30x Standing Heel Raises Limitations: toe raises 20x  Standing Eyes Opened: Narrow base of support (BOS);1 rep;Solid surface;30 secs Tandem Stance: Eyes open;2 reps;30 secs;Limitations Tandem Stance Limitations: BLE Other Standing Exercises: Hurdles 6 in and 12 in alternating hurdles and feet 3 RT with min-mod A   Physical Therapy Assessment and Plan PT Assessment and Plan Clinical Impression Statement: Continued to improve LE strengthening and balance.  Added TM for gait training to improve speed and gait mechanics.  Encouarged to use TM at home x10 minutes with approprirate speed and use wife for supervision. PT Plan: Continue with gait training and improving confidence with balance.  Add foam with double limb stance, cone rotation, marching on foam    Goals Home Exercise Program Pt/caregiver will Perform Home Exercise Program: Independently;For increased strengthening;For improved balance PT Short Term Goals Time to Complete Short Term Goals: 3 weeks PT Short Term Goal 1: Pt will improve his prorioceptive awareness and begin ambulating with a single point cane.   PT Short Term Goal 1 - Progress: Met PT Short Term Goal 2: Pt will improve his gait velocity to 3.07 ft/sec with a SPC for normalized age range.  PT Short Term Goal 2 - Progress:  Progressing toward goal PT Long Term Goals Time to Complete Long Term Goals:  (6 weeks) PT Long Term Goal 1: Pt will improve his activity tolerance in order to ambulate x15 minutes independently in an indoor and outdoor environment to continue with a walking program for weight loss.  PT Long Term Goal 1 - Progress: Progressing toward goal PT Long Term Goal 2: Pt will improve his Berg Balance Score to 50/56 to decrease risk of falls.  PT Long Term Goal 2 - Progress: Progressing toward goal Long Term Goal 3: Pt will improve his TUG time to less than 13 seconds walking independently in order to safely ambulate in the community.  Long Term Goal 3 Progress: Progressing toward goal Long Term Goal 4: Pt will improve his functional strength and activity tolerance 5 times in 8.4 seconds for normalized age range.  Long Term Goal 4 Progress: Progressing toward goal  Problem List Patient Active Problem List   Diagnosis Date Noted  . Difficulty in walking(719.7) 01/25/2013  . CVA (cerebral infarction) 01/22/2013  . Lack of coordination 01/22/2013  . OA (osteoarthritis) of knee 09/05/2011  . Knee pain, right 09/05/2011  . KNEE, ARTHRITIS, DEGEN./OSTEO 01/30/2009  . SHOULDER PAIN 01/16/2009  . KNEE PAIN 01/16/2009  . IMPINGEMENT SYNDROME 01/16/2009  . TENDINITIS, LEFT KNEE 01/16/2009    PT - End of Session Equipment Utilized During Treatment: Gait belt Activity Tolerance: Patient limited by fatigue;Patient tolerated treatment well General Behavior During Therapy: [facility] for tasks assessed/performed  GP    Jimmi Sidener, MPT, ATC 02/15/2013, 3:10 PM

## 2013-02-16 ENCOUNTER — Ambulatory Visit (INDEPENDENT_AMBULATORY_CARE_PROVIDER_SITE_OTHER): Payer: BC Managed Care – PPO

## 2013-02-16 ENCOUNTER — Ambulatory Visit (INDEPENDENT_AMBULATORY_CARE_PROVIDER_SITE_OTHER): Payer: BC Managed Care – PPO | Admitting: Orthopedic Surgery

## 2013-02-16 VITALS — BP 172/90 | Ht 70.0 in | Wt 297.0 lb

## 2013-02-16 DIAGNOSIS — M25519 Pain in unspecified shoulder: Secondary | ICD-10-CM

## 2013-02-16 DIAGNOSIS — M25511 Pain in right shoulder: Secondary | ICD-10-CM

## 2013-02-16 NOTE — Patient Instructions (Signed)
Post stroke shoulder pain Recommend physical therapy and subacromial injection

## 2013-02-16 NOTE — Progress Notes (Signed)
Patient ID: Zachary Murillo, male   DOB: 08/22/1950, 62 y.o.   MRN: [id number]  Chief Complaint  Patient presents with  . Shoulder Pain    Right shoulder pain, no injury.    BP 172/90  Ht 5\' 10"  (1.778 m)  Wt 297 lb (134.718 kg)  BMI 42.61 kg/m2  Status post stroke recently. Comes in for evaluation of pre-showed right shoulder pain. Notices more weakness in the shoulder since the stroke. Recovering nicely from the stroke currently in physical therapy program. He also wants reexamination of his right knee which we've seen him for before for chronic arthritis is received multiple cortisone injections his knee is unstable at this point. Complains of no pain in the knee at this time.  He is walking a little bit worse and with a more significant limp after his stroke  His right shoulder pain started over 2 years ago came on gradually. He does not stabbing pain 8/10. It tends to come and go with certain motions of the shoulder. It seems to be better with rest he does have pain with forward elevation past 90 and he has weakness on percent of the shoulder. An arm. Review of systems currently he was scheduled for evaluation for gastric bypass but he had blood pressure issues and then had the strokes and that is on hold. He does note the pain and stiffness in the shoulder and intermittent pain and swelling in the knee. His gait is an unsteady as stated his other systems are normal  He has no allergies  He has diabetes and hypertension some sleep apnea he did have a stroke.  Surgery for deviated septum. Arthroscopy of the knee. Heart catheterization which was normal. Endoscopy.  Family history heart disease arthritis cancer asthma and diabetes  Social history married retired Chiropractor does not smoke social alcohol use caffeine use reported and he also has his masters degree  Vital signs are stable basic metabolic index is high. General overall appearance is normal. He is oriented  x3 his mood is normal has an altered gait with a limp favoring his right lower extremity  His right shoulder does not have a significant amount of tenderness but he has decreased external rotation compared to his left shoulder. She rotation on the right is approximately 35 versus the left side which is 45. He has painful for elevation at 90 220 his passive range of motion is 150 with pain throughout the ARC of motion from 9250. However, the shoulder remained stable. Has good internal and external rotation strength but weakness in his rotator cuff. His skin is intact. Radial and ulnar pulses are normal normal sensation is noted no axillary lymph nodes are palpable.  His x-ray shows glenohumeral arthritis  Recommend subacromial injection  Continue physical therapy  Call our office for reevaluation after his therapy has been completed.  Shoulder Injection Procedure Note   Pre-operative Diagnosis: right  RC Syndrome  Post-operative Diagnosis: same  Indications: pain   Anesthesia: ethyl chloride   Procedure Details   Verbal consent was obtained for the procedure. The shoulder was prepped withalcohol and the skin was anesthetized. A 20 gauge needle was advanced into the subacromial space through posterior approach without difficulty  The space was then injected with 3 ml 1% lidocaine and 1 ml of depomedrol. The injection site was cleansed with isopropyl alcohol and a dressing was applied.  Complications:  None; patient tolerated the procedure well.

## 2013-02-17 ENCOUNTER — Ambulatory Visit (HOSPITAL_COMMUNITY)
Admission: RE | Admit: 2013-02-17 | Discharge: 2013-02-17 | Disposition: A | Discharge status: Home / Self Care | Payer: BC Managed Care – PPO | Source: Ambulatory Visit | Attending: Pulmonary Disease | Admitting: Pulmonary Disease

## 2013-02-17 DIAGNOSIS — R262 Difficulty in walking, not elsewhere classified: Secondary | ICD-10-CM

## 2013-02-17 DIAGNOSIS — R279 Unspecified lack of coordination: Secondary | ICD-10-CM

## 2013-02-17 DIAGNOSIS — I639 Cerebral infarction, unspecified: Secondary | ICD-10-CM

## 2013-02-17 NOTE — Progress Notes (Signed)
Physical Therapy Treatment Patient Details  Name: Zachary Murillo MRN: [id number] Date of Birth: October 30, 1950  Today's Date: 02/17/2013 Time: 4259-5638 PT Time Calculation (min): 41 min Charge: NMR 1352-1415, TE 1415-1425, Gait [phone]  Visit#: 11 of 12  Re-eval: 03/15/13 Assessment Diagnosis: CVA Next MD Visit: Dr. Marlowe Kays  Subjective: Symptoms/Limitations Symptoms: Pt reported MD happy with progress, most difficulty continues to be weak Rt LE with fatigue followoing walking for long periods of time later in the day.   Pain Assessment Currently in Pain?: No/denies  Precautions/Restrictions  Precautions Precautions: Fall Restrictions Weight Bearing Restrictions: No  Exercise/Treatments Aerobic Tread Mill: gait training: 8 minutes 0.53 cyc/sec w/cueing for leg length Machines for Strengthening Cybex Knee Extension: 3 PL x20 reps Cybex Knee Flexion: 7Pl x 20 Cybex Leg Press: 8.5 Pl x 20 Standing Heel Raises: 20 reps;Limitations Heel Raises Limitations: toe raises 20x   Balance Exercises Standing Cone Rotation: Foam;R/L Marching: Foam;10 reps;Limitations Marching Limitations: 5" holds Other Standing Exercises: Hurdles 6 in and 12 in alternating hurdles and feet 3 RT with min-mod A   Physical Therapy Assessment and Plan PT Assessment and Plan Clinical Impression Statement: Progressed to dynamic LE strengthening and balance activities with min assistance required for LOB on dynamc surface.  Improved gait mechanics on TM, still requires min cueing to advance LE forward, heel to toe pattern with Rt LE and posture to improve gait mechanics and speed.   PT Plan: Continue with gait training and improving confidence with balance.      Goals Home Exercise Program Pt/caregiver will Perform Home Exercise Program: Independently;For increased strengthening;For improved balance PT Short Term Goals Time to Complete Short Term Goals: 3 weeks PT Short Term Goal 1: Pt will  improve his prorioceptive awareness and begin ambulating with a single point cane.   PT Short Term Goal 2: Pt will improve his gait velocity to 3.07 ft/sec with a SPC for normalized age range.  PT Short Term Goal 2 - Progress: Progressing toward goal PT Long Term Goals Time to Complete Long Term Goals:  (6 weeks) PT Long Term Goal 1: Pt will improve his activity tolerance in order to ambulate x15 minutes independently in an indoor and outdoor environment to continue with a walking program for weight loss.  PT Long Term Goal 1 - Progress: Progressing toward goal PT Long Term Goal 2: Pt will improve his Berg Balance Score to 50/56 to decrease risk of falls.  PT Long Term Goal 2 - Progress: Progressing toward goal Long Term Goal 3: Pt will improve his TUG time to less than 13 seconds walking independently in order to safely ambulate in the community.  Long Term Goal 4: Pt will improve his functional strength and activity tolerance 5 times in 8.4 seconds for normalized age range.  Long Term Goal 4 Progress: Progressing toward goal  Problem List Patient Active Problem List   Diagnosis Date Noted  . Difficulty in walking(719.7) 01/25/2013  . CVA (cerebral infarction) 01/22/2013  . Lack of coordination 01/22/2013  . OA (osteoarthritis) of knee 09/05/2011  . Knee pain, right 09/05/2011  . KNEE, ARTHRITIS, DEGEN./OSTEO 01/30/2009  . SHOULDER PAIN 01/16/2009  . KNEE PAIN 01/16/2009  . IMPINGEMENT SYNDROME 01/16/2009  . TENDINITIS, LEFT KNEE 01/16/2009    PT - End of Session Equipment Utilized During Treatment: Gait belt Activity Tolerance: Patient limited by fatigue;Patient tolerated treatment well General Behavior During Therapy: [facility] for tasks assessed/performed  GP    Juel Burrow 02/17/2013, 4:35 PM

## 2013-02-18 ENCOUNTER — Ambulatory Visit (HOSPITAL_COMMUNITY)
Admission: RE | Admit: 2013-02-18 | Discharge: 2013-02-18 | Disposition: A | Discharge status: Home / Self Care | Payer: BC Managed Care – PPO | Source: Ambulatory Visit | Attending: Pulmonary Disease | Admitting: Pulmonary Disease

## 2013-02-18 DIAGNOSIS — I639 Cerebral infarction, unspecified: Secondary | ICD-10-CM

## 2013-02-18 DIAGNOSIS — R279 Unspecified lack of coordination: Secondary | ICD-10-CM

## 2013-02-18 DIAGNOSIS — R262 Difficulty in walking, not elsewhere classified: Secondary | ICD-10-CM

## 2013-02-18 NOTE — Progress Notes (Signed)
Occupational Therapy Treatment Patient Details  Name: Zachary Murillo MRN: [id number] Date of Birth: 03/23/1950  Today's Date: 02/18/2013 Time: 1435-1520 OT Time Calculation (min): 45 min ADL 1435-1450 15' Neuro reeducation 1450-1520 30'  Visit#: 10 of 18  Re-eval: 03/17/13     Subjective S:  Im a little sore from yesterday, and its ok. Pain Assessment Currently in Pain?: Yes Pain Score: 3  Pain Location: Shoulder Pain Orientation: Right Pain Type: Acute pain  Precautions/Restrictions     Exercise/Treatments UBE 3' and 3' 3.5  Hand Exercises Other Hand Exercises: perfection best of 5 was 4 pieces in 10 seconds using sides of pieces vs handles to manipulate pieces  Other Hand Exercises: played war - patient able to shuffle cards and deal/flip cards with right hand without difficulty     Activities of Daily Living Activities of Daily Living: Used black stool on table sideways to simulate a steering wheel.  Patient drove from hospital to home, making appropriate turns and using turn signal.  To simulate backing up, patient kept right arm on shoulder arc at 12 oclock and reached to right to grab 15 cones and place in front of body, and then repeated on left.  tested reflexes of right foot for switching between gas and brake, whcih he completed quickly and without faltering.   Occupational Therapy Assessment and Plan OT Assessment and Plan Clinical Impression Statement: A:  Demonstrated WFL reflexes with right foot when swtiching from brake to gas pedal, able to turn simulated steeringwheel well.  Gave HEP to drive in a church parking lot prior to returning to therapy. OT Plan: P:  Follow up on driving, complete perfection with 6 pegs in 10 seconds.    Goals Short Term Goals Short Term Goal 1: Patient will be educated on a HEP. Short Term Goal 2: Patient will increase GMC from fair to good - for increased use of his right arm as dominant. Short Term Goal 3: Patient will increase  rate of speed with fine motor tasks by completing the nine hole peg test in 40" or less. Short Term Goal 4: Patient will be SBA with all BADLs. Short Term Goal 5: Patient will improve his right grip strength by 15 pounds and pinch strength by 2 pounds for increased ability to open containers. Long Term Goals Long Term Goal 1: Patient will use his right arm as dominant with all B/IADLs, work, and leisure activities and complete activities at prior level of [place].  Long Term Goal 2: Patient will increase GMC from good - to normal for increased use of his right arm as dominant. Long Term Goal 3: Patient will increase rate of speed with fine motor tasks by completing the nine hole peg test in 30" or less. Long Term Goal 4: Patient will improve his right grip strength by 30 pounds and pinch strength by 4 pounds for increased ability to open containers. Long Term Goal 5: Patient will resume driving without modifications.   Problem List Patient Active Problem List   Diagnosis Date Noted  . Difficulty in walking(719.7) 01/25/2013  . CVA (cerebral infarction) 01/22/2013  . Lack of coordination 01/22/2013  . OA (osteoarthritis) of knee 09/05/2011  . Knee pain, right 09/05/2011  . KNEE, ARTHRITIS, DEGEN./OSTEO 01/30/2009  . SHOULDER PAIN 01/16/2009  . KNEE PAIN 01/16/2009  . IMPINGEMENT SYNDROME 01/16/2009  . TENDINITIS, LEFT KNEE 01/16/2009    End of Session Activity Tolerance: Patient tolerated treatment well General Behavior During Therapy: [facility] for tasks assessed/performed  GO    Shirlean Mylar, OTR/L  02/18/2013, 3:29 PM

## 2013-02-18 NOTE — Progress Notes (Signed)
Physical Therapy Treatment Patient Details  Name: Zachary Murillo MRN: [id number] Date of Birth: 1950-08-03  Today's Date: 02/18/2013 Time: 1522-1610 PT Time Calculation (min): 48 min Charge :NMR 1522-1545, TE 1545-1600, Gait 1600-1608  Visit#: 12 of 21  Re-eval: 03/15/13 Assessment Diagnosis: CVA Next MD Visit: Dr. Marlowe Kays  Subjective: Symptoms/Limitations Symptoms: Pt reported preparing for a poker night tonight.  Pt stated most difficulty continues to be Rt LE getting fatigued following long periods of time. Pain Assessment Currently in Pain?: No/denies Pain Score: 3  Pain Location: Shoulder Pain Orientation: Right Pain Type: Acute pain  Precautions/Restrictions  Precautions Precautions: Fall Restrictions Weight Bearing Restrictions: No  Exercise/Treatments Aerobic Tread Mill: gait training: 8 minutes 1.3 mph w/cueing for leg length and posture Machines for Strengthening Cybex Knee Extension: 3 PL x20 reps Cybex Knee Flexion: 7Pl x 20 Cybex Leg Press: 8.5 Pl x 20 Standing Other Standing Knee Exercises: Postural strengthening green tband 15x extension, rows, retraction, adduction Bil UE Other Standing Knee Exercises: PFC 5x 10"  Balance Exercises Standing Cone Rotation: Foam;R/L Marching: Foam;10 reps;Limitations Marching Limitations: 5" holds Other Standing Exercises: Standing on foam playing cards x 10 minutes to improve activity tolerance on dynamic surface   Physical Therapy Assessment and Plan PT Assessment and Plan Clinical Impression Statement: Session focus on improving balance and activity tolerance with functional activities.  Pt limiuted by fatigue following 10 minutes standing on foam playing cards.  Pt c/o back pain with cone rotation.  Added core and postural strengthening exercises to reduce pain and improve gait mechanics.   PT Plan: Continue with gait training and improving confidence with balance.  Next session increase gait training  time on TM.    Goals Home Exercise Program Pt/caregiver will Perform Home Exercise Program: Independently;For increased strengthening;For improved balance PT Short Term Goals Time to Complete Short Term Goals: 3 weeks PT Short Term Goal 1: Pt will improve his prorioceptive awareness and begin ambulating with a single point cane.   PT Short Term Goal 2: Pt will improve his gait velocity to 3.07 ft/sec with a SPC for normalized age range.  PT Short Term Goal 2 - Progress: Progressing toward goal PT Long Term Goals Time to Complete Long Term Goals:  (6 weeks) PT Long Term Goal 1: Pt will improve his activity tolerance in order to ambulate x15 minutes independently in an indoor and outdoor environment to continue with a walking program for weight loss.  PT Long Term Goal 1 - Progress: Progressing toward goal PT Long Term Goal 2: Pt will improve his Berg Balance Score to 50/56 to decrease risk of falls.  PT Long Term Goal 2 - Progress: Progressing toward goal Long Term Goal 3: Pt will improve his TUG time to less than 13 seconds walking independently in order to safely ambulate in the community.  Long Term Goal 3 Progress: Progressing toward goal Long Term Goal 4: Pt will improve his functional strength and activity tolerance 5 times in 8.4 seconds for normalized age range.  Long Term Goal 4 Progress: Progressing toward goal  Problem List Patient Active Problem List   Diagnosis Date Noted  . Difficulty in walking(719.7) 01/25/2013  . CVA (cerebral infarction) 01/22/2013  . Lack of coordination 01/22/2013  . OA (osteoarthritis) of knee 09/05/2011  . Knee pain, right 09/05/2011  . KNEE, ARTHRITIS, DEGEN./OSTEO 01/30/2009  . SHOULDER PAIN 01/16/2009  . KNEE PAIN 01/16/2009  . IMPINGEMENT SYNDROME 01/16/2009  . TENDINITIS, LEFT KNEE 01/16/2009    PT -  End of Session Equipment Utilized During Treatment: Gait belt Activity Tolerance: Patient limited by fatigue;Patient tolerated treatment  well General Behavior During Therapy: [facility] for tasks assessed/performed  GP    Juel Burrow 02/18/2013, 4:13 PM

## 2013-02-18 NOTE — Evaluation (Signed)
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 Diabetes mellitus   . Sleep apnea   . Overweight(278.02)   . Hypertension    Past Surgical History:  Past Surgical History  Procedure Laterality Date  . Knee arthroscopy    . Nasal septum surgery    . Cardiac catheterization      Subjective Symptoms/Limitations Symptoms: S:  I went to Dr. 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  Assessment ADL/Vision/Perception ADL ADL Comments: Mr Lembke reports he is showering, dressing independently with use of BUE these days and is able to tie his shoes and zip vest.  Dominant Hand: Right   Sensation/Coordination/Edema Coordination Gross Motor Movements are Fluid and Coordinated: No (good-) Fine Motor Movements are Fluid and Coordinated: No Coordination and Movement Description: Patient demonstrates slower rates of movement and less fluid movements in his right arm Finger Nose Finger Test: good- 9 Hole Peg Test: R:  37.59;  L:  29.06 (R: 49.64; L 32.5)  Additional Assessments RUE Strength Grip (lbs): 66 (39) Lateral Pinch: 26  lbs (23) 3 Point Pinch: 15 lbs (8) LUE Strength Grip (lbs): 105 (77) Lateral Pinch: 25 lbs (22) 3 Point Pinch: 20 lbs (18) Right Hand Strength - Pinch (lbs) Lateral Pinch: 26 lbs (23) 3 Point Pinch: 15 lbs (8) Left Hand Strength - Pinch (lbs) Lateral Pinch: 25 lbs (22) 3 Point Pinch: 20 lbs (18)   Exercise/Treatments Wrist Exercises Wrist Flexion: Strengthening;15 reps (green theraputty ) Wrist Extension: Strengthening;15 reps (green theraputty ) Wrist Radial Deviation: Strengthening;15 reps (green theraputty) Wrist Ulnar Deviation: Strengthening;15 reps (green theraputty )  Patient required increased cues to maintain wrist in neutral positioning for exercises this date.   Theraputty: Flatten;Roll;Grip Theraputty - Flatten: green Theraputty - Roll: green Theraputty - Grip: green  (supinated/pronated arm )  Hand Exercises Theraputty: Flatten;Roll;Grip Theraputty - Flatten: green Theraputty - Roll: green Theraputty - Grip: green  (supinated/pronated arm )        Occupational Therapy Assessment and Plan OT Assessment and Plan Clinical Impression Statement: A:  Reassessment completed this date.  Patient met 5/5 STGs this date and continues to demonstrate excellent progress towards long term goals.  He demonstrates functional improvements with range of motion, strength and use of his dominant RUE and reports increased safety/independence with all ADL tasks.   He continues to 'hook' right wrist with functional tasks secondary to weakness.  Patient/wife report Dr Romeo Apple stated concern regarding wrist as well.  OT Plan: P:  Increase stability/strengthening exercises; Simulate driving tasks in neutral environment to increase patient safety/confidence.     Goals Short Term Goals Short Term Goal 1: Patient will be educated on a HEP. Short Term Goal 1 Progress: Met Short Term Goal 2: Patient will increase GMC from fair to  good - for increased use of his right arm as dominant. Short  Term Goal 2 Progress: Met Short Term Goal 3: Patient will increase rate of speed with fine motor tasks by completing the nine hole peg test in 40" or less. Short Term Goal 3 Progress: Met Short Term Goal 4: Patient will be SBA with all BADLs. Short Term Goal 4 Progress: Met Short Term Goal 5: Patient will improve his right grip strength by 15 pounds and pinch strength by 2 pounds for increased ability to open containers. Short Term Goal 5 Progress: Met Long Term Goals Long Term Goal 1: Patient will use his right arm as dominant with all B/IADLs, work, and leisure activities and complete activities at prior level of Independence.  Long Term Goal 1 Progress: Progressing toward goal Long Term Goal 2: Patient will increase GMC from good - to normal for increased use of his right arm as dominant. Long Term Goal 2 Progress: Progressing toward goal Long Term Goal 3: Patient will increase rate of speed with fine motor tasks by completing the nine hole peg test in 30" or less. Long Term Goal 3 Progress: Progressing toward goal Long Term Goal 4: Patient will improve his right grip strength by 30 pounds and pinch strength by 4 pounds for increased ability to open containers. Long Term Goal 4 Progress: Progressing toward goal Long Term Goal 5: Patient will resume driving without modifications.  Long Term Goal 5 Progress: Progressing toward goal  Problem List Patient Active Problem List   Diagnosis Date Noted  . Difficulty in walking(719.7) 01/25/2013  . CVA (cerebral infarction) 01/22/2013  . Lack of coordination 01/22/2013  . OA (osteoarthritis) of knee 09/05/2011  . Knee pain, right 09/05/2011  . KNEE, ARTHRITIS, DEGEN./OSTEO 01/30/2009  . SHOULDER PAIN 01/16/2009  . KNEE PAIN 01/16/2009  . IMPINGEMENT SYNDROME 01/16/2009  . TENDINITIS, LEFT KNEE 01/16/2009    End of Session Activity Tolerance: Patient tolerated treatment well General Behavior During Therapy: [facility] for tasks  assessed/performed  GO    Velora Mediate, OTR/L  02/17/2013,  2:50 PM  Physician Documentation Your signature is required to indicate approval of the treatment plan as stated above.  Please sign and either send electronically or make a copy of this report for your files and return this physician signed original.  Please mark one 1.__approve of plan  2. ___approve of plan with the following conditions.   ______________________________                                                          _____________________ Physician Signature                                                                                                             Date

## 2013-02-19 ENCOUNTER — Ambulatory Visit (HOSPITAL_COMMUNITY): Payer: BC Managed Care – PPO | Admitting: Occupational Therapy

## 2013-02-19 DIAGNOSIS — R7989 Other specified abnormal findings of blood chemistry: Secondary | ICD-10-CM | POA: Insufficient documentation

## 2013-02-22 ENCOUNTER — Ambulatory Visit (HOSPITAL_COMMUNITY): Payer: BC Managed Care – PPO | Admitting: Occupational Therapy

## 2013-02-22 ENCOUNTER — Ambulatory Visit (HOSPITAL_COMMUNITY)
Admission: RE | Admit: 2013-02-22 | Discharge: 2013-02-22 | Disposition: A | Discharge status: Home / Self Care | Payer: BC Managed Care – PPO | Source: Ambulatory Visit | Attending: Pulmonary Disease | Admitting: Pulmonary Disease

## 2013-02-22 DIAGNOSIS — R262 Difficulty in walking, not elsewhere classified: Secondary | ICD-10-CM

## 2013-02-22 DIAGNOSIS — I639 Cerebral infarction, unspecified: Secondary | ICD-10-CM

## 2013-02-22 DIAGNOSIS — R279 Unspecified lack of coordination: Secondary | ICD-10-CM

## 2013-02-22 HISTORY — DX: Cerebral infarction, unspecified: I63.9

## 2013-02-22 NOTE — Progress Notes (Signed)
Physical Therapy Treatment Patient Details  Name: Zachary Murillo MRN: [id number] Date of Birth: 17-Dec-1950  Today's Date: 02/22/2013 Time: 7829-5621 PT Time Calculation (min): 47 min Charge:Gait 3086-5784, NMR 6962-9528, TE 4132-4401  Visit#: 13 of 21  Re-eval: 03/15/13 Assessment Diagnosis: CVA Next MD Visit: Dr. Marlowe Kays  Subjective: Symptoms/Limitations Symptoms: Pt reported fatigue following traveling to [facility] earlier today.  Pt stated most difficulty continues to be Rt foot drop following walking for long periods of time.   Pain Assessment Currently in Pain?: No/denies  Precautions/Restrictions  Precautions Precautions: Fall Restrictions Weight Bearing Restrictions: No  Exercise/Treatments Aerobic Tread Mill: gait training: 10 minutes 1.3 mph w/cueing for leg length and posture Machines for Strengthening Cybex Knee Extension: 4PL x 15 reps Cybex Knee Flexion: 8Pl x 15 Cybex Leg Press: 2.5 Pl dorsi and plantar flexion x 15 Standing Other Standing Knee Exercises: Postural strengthening green tband 15x extension, rows, retraction, adduction Bil UE; pt given theraband and worksheet to add to HEP  Balance Exercises Standing Cone Rotation: Foam;R/L Marching: Foam;Limitations;15 reps Marching Limitations: 5" holds   Physical Therapy Assessment and Plan PT Assessment and Plan Clinical Impression Statement: Session focus on improving activity tolerance and gait mechanics, min cueing required for posture during gait training.  Pt educated on postural strengthening exercises and able to demonstrate appropraite technique with following min cueing for form, pt given theraband and worksheet to add to HEP.  Therex focus on improving tibialis anterior and overall LE strength to improve gait mechanics.   PT Plan: Continue with gait training and improving confidence with balance. Continue to increase time on TM to improve gait mechanics and activity tolerance.       Goals Home Exercise Program Pt/caregiver will Perform Home Exercise Program: Independently;For increased strengthening;For improved balance PT Short Term Goals Time to Complete Short Term Goals: 3 weeks PT Short Term Goal 1: Pt will improve his prorioceptive awareness and begin ambulating with a single point cane.   PT Short Term Goal 2: Pt will improve his gait velocity to 3.07 ft/sec with a SPC for normalized age range.  PT Long Term Goals Time to Complete Long Term Goals:  (6 weeks) PT Long Term Goal 1: Pt will improve his activity tolerance in order to ambulate x15 minutes independently in an indoor and outdoor environment to continue with a walking program for weight loss.  PT Long Term Goal 1 - Progress: Progressing toward goal PT Long Term Goal 2: Pt will improve his Berg Balance Score to 50/56 to decrease risk of falls.  PT Long Term Goal 2 - Progress: Progressing toward goal Long Term Goal 3: Pt will improve his TUG time to less than 13 seconds walking independently in order to safely ambulate in the community.  Long Term Goal 4: Pt will improve his functional strength and activity tolerance 5 times in 8.4 seconds for normalized age range.  Long Term Goal 4 Progress: Progressing toward goal  Problem List Patient Active Problem List   Diagnosis Date Noted  . Difficulty in walking(719.7) 01/25/2013  . CVA (cerebral infarction) 01/22/2013  . Lack of coordination 01/22/2013  . OA (osteoarthritis) of knee 09/05/2011  . Knee pain, right 09/05/2011  . KNEE, ARTHRITIS, DEGEN./OSTEO 01/30/2009  . SHOULDER PAIN 01/16/2009  . KNEE PAIN 01/16/2009  . IMPINGEMENT SYNDROME 01/16/2009  . TENDINITIS, LEFT KNEE 01/16/2009    PT - End of Session Equipment Utilized During Treatment: Gait belt Activity Tolerance: Patient limited by fatigue;Patient tolerated treatment well General Behavior During  Therapy: WFL for tasks assessed/performed  GP    Juel Burrow 02/22/2013, 4:59  PM

## 2013-02-24 ENCOUNTER — Ambulatory Visit (HOSPITAL_COMMUNITY)
Admission: RE | Admit: 2013-02-24 | Discharge: 2013-02-24 | Disposition: A | Discharge status: Home / Self Care | Payer: BC Managed Care – PPO | Source: Ambulatory Visit | Attending: Pulmonary Disease | Admitting: Pulmonary Disease

## 2013-02-24 DIAGNOSIS — I639 Cerebral infarction, unspecified: Secondary | ICD-10-CM

## 2013-02-24 DIAGNOSIS — R262 Difficulty in walking, not elsewhere classified: Secondary | ICD-10-CM

## 2013-02-24 DIAGNOSIS — R279 Unspecified lack of coordination: Secondary | ICD-10-CM

## 2013-02-24 NOTE — Progress Notes (Signed)
Physical Therapy Treatment Patient Details  Name: Zachary Murillo MRN: [id number] Date of Birth: 1950-08-21  Today's Date: 02/24/2013 Time: 4098-1191 PT Time Calculation (min): 43 min Charge: Gait 1347-1402, TE 1402-1415, NMR 1415-1430  Visit#: 14 of 21  Re-eval: 03/15/13 Assessment Diagnosis: CVA Next MD Visit: Dr. Marlowe Kays  Subjective: Symptoms/Limitations Symptoms: Pt reported completing his new HEP postural strengthening exercises and continues to do gait training at home.  No pain today. Pain Assessment Currently in Pain?: No/denies  Precautions/Restrictions  Precautions Precautions: Fall Restrictions Weight Bearing Restrictions: No  Exercise/Treatments Aerobic Tread Mill: gait training: 15 minutes 1.4 mph w/cueing for leg length and posture Machines for Strengthening Cybex Knee Extension: 4PL x 15 reps Cybex Knee Flexion: 8Pl x 15 Cybex Leg Press: 3Pl dorsi and plantar flexion x 15 Standing Heel Raises: Limitations Heel Raises Limitations: heel walking; 20 toe raises Other Standing Knee Exercises: Balance beam 1RT tandem and retro gait   Physical Therapy Assessment and Plan PT Assessment and Plan Clinical Impression Statement: Pt with improve gait mechanics and able to tolerance increased time with gait mechanics, min cueing required for posture this session.  Progressed dynamic balance training with min assistnace required and vc-ing to improve spatial awareness.  Pt limited by fatigue with activites, no report of pain this session.   PT Plan: Continue with gait training and improving confidence with balance. Continue to increase time on TM to improve gait mechanics and activity tolerance.      Goals Home Exercise Program Pt/caregiver will Perform Home Exercise Program: Independently;For increased strengthening;For improved balance PT Short Term Goals Time to Complete Short Term Goals: 3 weeks PT Short Term Goal 1: Pt will improve his prorioceptive  awareness and begin ambulating with a single point cane.   PT Short Term Goal 2: Pt will improve his gait velocity to 3.07 ft/sec with a SPC for normalized age range.  PT Short Term Goal 2 - Progress: Progressing toward goal PT Long Term Goals Time to Complete Long Term Goals:  (6 weeks) PT Long Term Goal 1: Pt will improve his activity tolerance in order to ambulate x15 minutes independently in an indoor and outdoor environment to continue with a walking program for weight loss.  PT Long Term Goal 1 - Progress: Progressing toward goal PT Long Term Goal 2: Pt will improve his Berg Balance Score to 50/56 to decrease risk of falls.  PT Long Term Goal 2 - Progress: Progressing toward goal Long Term Goal 3: Pt will improve his TUG time to less than 13 seconds walking independently in order to safely ambulate in the community.  Long Term Goal 4: Pt will improve his functional strength and activity tolerance 5 times in 8.4 seconds for normalized age range.  Long Term Goal 4 Progress: Progressing toward goal  Problem List Patient Active Problem List   Diagnosis Date Noted  . Difficulty in walking(719.7) 01/25/2013  . CVA (cerebral infarction) 01/22/2013  . Lack of coordination 01/22/2013  . OA (osteoarthritis) of knee 09/05/2011  . Knee pain, right 09/05/2011  . KNEE, ARTHRITIS, DEGEN./OSTEO 01/30/2009  . SHOULDER PAIN 01/16/2009  . KNEE PAIN 01/16/2009  . IMPINGEMENT SYNDROME 01/16/2009  . TENDINITIS, LEFT KNEE 01/16/2009    PT - End of Session Equipment Utilized During Treatment: Gait belt Activity Tolerance: Patient tolerated treatment well;Patient limited by fatigue General Behavior During Therapy: [facility] for tasks assessed/performed  GP    Juel Burrow 02/24/2013, 2:45 PM

## 2013-02-26 ENCOUNTER — Ambulatory Visit (HOSPITAL_COMMUNITY)
Admission: RE | Admit: 2013-02-26 | Discharge: 2013-02-26 | Disposition: A | Discharge status: Home / Self Care | Payer: BC Managed Care – PPO | Source: Ambulatory Visit | Attending: Pulmonary Disease | Admitting: Pulmonary Disease

## 2013-02-26 DIAGNOSIS — R262 Difficulty in walking, not elsewhere classified: Secondary | ICD-10-CM

## 2013-02-26 DIAGNOSIS — I639 Cerebral infarction, unspecified: Secondary | ICD-10-CM

## 2013-02-26 DIAGNOSIS — R279 Unspecified lack of coordination: Secondary | ICD-10-CM

## 2013-02-26 NOTE — Progress Notes (Signed)
Physical Therapy Treatment Patient Details  Name: Zachary Murillo MRN: [id number] Date of Birth: 1950-08-05  Today's Date: 02/26/2013 Time: 1302-1352 PT Time Calculation (min): 50 min Charge:Gait 1302-1340, TE 7829-5621  Visit#: 15 of 24  Re-eval: 03/15/13   Subjective: Symptoms/Limitations Symptoms: Pt stated increased difficulty controlling speed when walking downhill Pain Assessment Currently in Pain?: No/denies Pain Score: 2  Pain Location: Shoulder Pain Orientation: Right Pain Type: Acute pain Multiple Pain Sites: No  Precautions/Restrictions  Precautions Precautions: Fall  Exercise/Treatments Machines for Strengthening Cybex Knee Extension: 4PL x 15 reps Cybex Knee Flexion: 8Pl x 15 Cybex Leg Press: 4Pl dorsi and plantar flexion x 15 Total Gym Leg Press: lumbar extension 2.5 Pl x 15 Standing Heel Raises: Limitations Heel Raises Limitations: heel and toe walking 1RT Gait Training: Gait training outdoors incline/decline, curbs, stairs, grass hills, with SPC in UE to improve arm swing with gait mechanics Other Standing Knee Exercises: DGI 1330-1340    Physical Therapy Assessment and Plan PT Assessment and Plan Clinical Impression Statement: Session focus on improving gait mechanics to improve sequencing with UE and speed control with incline and decline slopes.  Completed gait training outdoor setting with SBA and intermittent use of SPC including incline/decline slopes, grassy hills, curbs,and steps.  Utilitzed [facility] with therapist facilitation to improve arm sequencing.  Min assistance was required with decline slopes for speed control.  DGI activties complete to improve gait velocity to improve speed when ambulating down hill.  Pt limted by fatigue at end of session, no reports of increased pain.   PT Plan: Continue with gait training and improving confidence with balance. Continue to increase time on TM to improve gait mechanics and activity tolerance.       Goals Home Exercise Program Pt/caregiver will Perform Home Exercise Program: Independently;For increased strengthening;For improved balance PT Short Term Goals Time to Complete Short Term Goals: 3 weeks PT Short Term Goal 1: Pt will improve his prorioceptive awareness and begin ambulating with a single point cane.   PT Short Term Goal 2: Pt will improve his gait velocity to 3.07 ft/sec with a SPC for normalized age range.  PT Short Term Goal 2 - Progress: Progressing toward goal PT Long Term Goals Time to Complete Long Term Goals:  (6 weeks) PT Long Term Goal 1: Pt will improve his activity tolerance in order to ambulate x15 minutes independently in an indoor and outdoor environment to continue with a walking program for weight loss.  PT Long Term Goal 1 - Progress: Progressing toward goal PT Long Term Goal 2: Pt will improve his Berg Balance Score to 50/56 to decrease risk of falls.  PT Long Term Goal 2 - Progress: Progressing toward goal Long Term Goal 3: Pt will improve his TUG time to less than 13 seconds walking independently in order to safely ambulate in the community.  Long Term Goal 3 Progress: Progressing toward goal Long Term Goal 4: Pt will improve his functional strength and activity tolerance 5 times in 8.4 seconds for normalized age range.  Long Term Goal 4 Progress: Progressing toward goal  Problem List Patient Active Problem List   Diagnosis Date Noted  . Difficulty in walking(719.7) 01/25/2013  . CVA (cerebral infarction) 01/22/2013  . Lack of coordination 01/22/2013  . OA (osteoarthritis) of knee 09/05/2011  . Knee pain, right 09/05/2011  . KNEE, ARTHRITIS, DEGEN./OSTEO 01/30/2009  . SHOULDER PAIN 01/16/2009  . KNEE PAIN 01/16/2009  . IMPINGEMENT SYNDROME 01/16/2009  . TENDINITIS, LEFT KNEE 01/16/2009  PT - End of Session Equipment Utilized During Treatment: Gait belt Activity Tolerance: Patient limited by fatigue;Patient tolerated treatment  well General Behavior During Therapy: [facility] for tasks assessed/performed  GP    Juel Burrow 02/26/2013, 5:15 PM

## 2013-02-26 NOTE — Progress Notes (Signed)
Occupational Therapy Treatment Patient Details  Name: Zachary Murillo MRN: [id number] Date of Birth: 12/02/1950  Today's Date: 02/26/2013 Time: 1355-1430 OT Time Calculation (min): 35 min Neuro reeeducation 7829-5621 20' Therapeutic exercises 1415-1430 15'  Visit#: 13 of 18  Re-eval: 03/17/13     Subjective  S:  My arm still kind of flops down when Im not using it and it tends to turn in when I grip (ulnar deviate) Pain Assessment Currently in Pain?: No/denies Pain Score: 2  Pain Location: Shoulder Pain Orientation: Right Pain Type: Acute pain Multiple Pain Sites: No  Precautions/Restrictions  Precautions Precautions: Fall  Exercise/Treatments Shoulder Seated Other Seated Exercises: Saebo ring tree from base to yellow to red to green and back X 2 - with attention to controlling arm on descent  ROM / Strengthening / Isometric Strengthening UBE (Upper Arm Bike): 3' and 3' 2.5 Over Head Lace: 3' with 1# resistance Other ROM/Strengthening Exercises: shoulder arc across at medium height and back x 15 ring for shoulder strengthening.     Wrist Exercises Wrist Extension: Strengthening;Theraband;15 reps (hold X 5" contraction, controlled descent 4#) Theraband Level (Wrist Extension): Level 3 (Green) Wrist Radial Deviation: Strengthening;Theraband;15 reps (hold X 5" contraction, controlled descent 4#) Theraband Level (Radial Deviation): Level 3 (Green)    Hand Exercises  Other Hand Exercises: perfection best of 5 was 5 pieces in 10 seconds using sides of pieces vs handles to manipulate pieces  Other Hand Exercises: handwriting:  2 sentence paragraph with cursive writing and tracing a loopy line x 3        Occupational Therapy Assessment and Plan OT Assessment and Plan Clinical Impression Statement: A:  Focused on strengthening of wrist extension and radial deviation in order to decrease amount of ulnar drag with gripping.  Issued as HEP. OT Plan: P:  Follow up on wrist  strengthening. incorporate radial deviation/extension with grip activity.   Goals Short Term Goals Short Term Goal 1: Patient will be educated on a HEP. Short Term Goal 1 Progress: Met Short Term Goal 2: Patient will increase GMC from fair to good - for increased use of his right arm as dominant. Short Term Goal 2 Progress: Met Short Term Goal 3: Patient will increase rate of speed with fine motor tasks by completing the nine hole peg test in 40" or less. Short Term Goal 3 Progress: Met Short Term Goal 4: Patient will be SBA with all BADLs. Short Term Goal 4 Progress: Met Short Term Goal 5: Patient will improve his right grip strength by 15 pounds and pinch strength by 2 pounds for increased ability to open containers. Short Term Goal 5 Progress: Met Long Term Goals Long Term Goal 1: Patient will use his right arm as dominant with all B/IADLs, work, and leisure activities and complete activities at prior level of Independence.  Long Term Goal 1 Progress: Progressing toward goal Long Term Goal 2: Patient will increase GMC from good - to normal for increased use of his right arm as dominant. Long Term Goal 2 Progress: Progressing toward goal Long Term Goal 3: Patient will increase rate of speed with fine motor tasks by completing the nine hole peg test in 30" or less. Long Term Goal 3 Progress: Progressing toward goal Long Term Goal 4: Patient will improve his right grip strength by 30 pounds and pinch strength by 4 pounds for increased ability to open containers. Long Term Goal 4 Progress: Progressing toward goal Long Term Goal 5: Patient will resume driving without  modifications.  Long Term Goal 5 Progress: Progressing toward goal  Problem List Patient Active Problem List   Diagnosis Date Noted  . Difficulty in walking(719.7) 01/25/2013  . CVA (cerebral infarction) 01/22/2013  . Lack of coordination 01/22/2013  . OA (osteoarthritis) of knee 09/05/2011  . Knee pain, right 09/05/2011   . KNEE, ARTHRITIS, DEGEN./OSTEO 01/30/2009  . SHOULDER PAIN 01/16/2009  . KNEE PAIN 01/16/2009  . IMPINGEMENT SYNDROME 01/16/2009  . TENDINITIS, LEFT KNEE 01/16/2009    End of Session Activity Tolerance: Patient tolerated treatment well General Behavior During Therapy: [facility] for tasks assessed/performed OT Plan of Care OT Home Exercise Plan: wrist extension and radial deviation Consulted and Agree with Plan of Care: Patient  GO    Shirlean Mylar, OTR/L  02/26/2013, 3:13 PM

## 2013-02-26 NOTE — Progress Notes (Signed)
Occupational Therapy Treatment Patient Details  Name: Zachary Murillo MRN: [id number] Date of Birth: [birth year]  Today's Date: 02/24/2013 Time: 1440-1520 OT Time Calculation (min): 40 min THerexercises 1440-1505 (25') TherActivities 1505-1520 (15')  Visit#: 12 of 18  Re-eval: 03/17/13    Authorization:    Authorization Time Period:    Authorization Visit#:   of    Subjective Symptoms/Limitations Symptoms: S:  My arm is sore from what we did and what you have me doing at home.  Pain Assessment Currently in Pain?: Yes Pain Score: 2  Pain Location: Shoulder Pain Orientation: Right Pain Type: Acute pain Multiple Pain Sites: No  Precautions/Restrictions     Exercise/Treatments ROM / Strengthening / Isometric Strengthening UBE (Upper Arm Bike): 3' and 3' 2.5 Other ROM/Strengthening Exercises: shoulder arc across at medium height and back x 15 ring for shoulder strengthening.       Wrist Exercises Wrist Extension: Strengthening;15 reps (green putty) Other wrist exercises: twisting putty with wrist in extension    Theraputty: Flatten;Roll;Grip;Pinch (putty press with 3/4" dowel x 25 )     Hand Exercises Theraputty: Flatten;Roll;Grip;Pinch (putty press with 3/4" dowel x 25 )        Occupational Therapy Assessment and Plan OT Assessment and Plan Clinical Impression Statement: A:  patient with continued increased fatigue in right shoulder for sustained extension tasks.  reports good follow through withHEP of cane.   OT Plan: P:  Folllow up on proximal strengthening activities for home, complete perfection with 6 pegs in 10 seconds.    Goals Short Term Goals Short Term Goal 1: Patient will be educated on a HEP. Short Term Goal 1 Progress: Met Short Term Goal 2: Patient will increase GMC from fair to good - for increased use of his right arm as dominant. Short Term Goal 2 Progress: Met Short Term Goal 3: Patient will increase rate of speed with fine motor tasks by  completing the nine hole peg test in 40" or less. Short Term Goal 3 Progress: Met Short Term Goal 4: Patient will be SBA with all BADLs. Short Term Goal 4 Progress: Met Short Term Goal 5: Patient will improve his right grip strength by 15 pounds and pinch strength by 2 pounds for increased ability to open containers. Short Term Goal 5 Progress: Met Long Term Goals Long Term Goal 1: Patient will use his right arm as dominant with all B/IADLs, work, and leisure activities and complete activities at prior level of Independence.  Long Term Goal 1 Progress: Progressing toward goal Long Term Goal 2: Patient will increase GMC from good - to normal for increased use of his right arm as dominant. Long Term Goal 2 Progress: Progressing toward goal Long Term Goal 3: Patient will increase rate of speed with fine motor tasks by completing the nine hole peg test in 30" or less. Long Term Goal 3 Progress: Progressing toward goal Long Term Goal 4: Patient will improve his right grip strength by 30 pounds and pinch strength by 4 pounds for increased ability to open containers. Long Term Goal 4 Progress: Progressing toward goal Long Term Goal 5: Patient will resume driving without modifications.  Long Term Goal 5 Progress: Progressing toward goal  Problem List Patient Active Problem List   Diagnosis Date Noted  . Difficulty in walking(719.7) 01/25/2013  . CVA (cerebral infarction) 01/22/2013  . Lack of coordination 01/22/2013  . OA (osteoarthritis) of knee 09/05/2011  . Knee pain, right 09/05/2011  . KNEE, ARTHRITIS, DEGEN./OSTEO 01/30/2009  .  SHOULDER PAIN 01/16/2009  . KNEE PAIN 01/16/2009  . IMPINGEMENT SYNDROME 01/16/2009  . TENDINITIS, LEFT KNEE 01/16/2009    End of Session Activity Tolerance: Patient tolerated treatment well General Behavior During Therapy: [facility] for tasks assessed/performed  GO    Rexanne Mano, OTR/l 02/24/2013, 4:28 PM

## 2013-02-26 NOTE — Progress Notes (Signed)
Occupational Therapy Treatment Patient Details  Name: Zachary Murillo MRN: [id number] Date of Birth: [birth year]  Today's Date: 02/22/2013 Time: 1518-1600 OT Time Calculation (min): 42 min NeuroMuscular Reeducation 1518-1600 (42')  Visit#: 11 of 18  Re-eval: 03/17/13    Subjective Symptoms/Limitations Symptoms: S:  I had my follow up at [facility] with my cardiologist this weekend and i think things are doing better.  Pain Assessment Currently in Pain?: No/denies  Precautions/Restrictions  Precautions Precautions: Fall Restrictions Weight Bearing Restrictions: No  Exercise/Treatments   Seated Other Seated Exercises: right hand place on shoulder arc for strengthening and control to assist with driving tasks and proprioception of RUE x 3 at 90", 60'" and 45" Other Seated Exercises: patient seatedwith right hand placed on cane, working with LUE/hand on table top tasks x 2 x 4 min each with moderate cues to maintain upright positioning of cane for Right shjoulder strengthening/stability. and proprioception  Hand Exercises Small Pegboard: grooved pegboard with tweezers to attain, place and remove with min difficulty and increassed time this date.  Other Hand Exercises: perfection best of 5 was 4 pieces in 10 seconds using sides of pieces vs handles to manipulate pieces         Occupational Therapy Assessment and Plan OT Assessment and Plan Clinical Impression Statement: A:  Patient with reported increased difficulty with proprioception and fatigue in RUE with driving tasks completed  per HEP.  Worked with and instructed patient and wife on tasks for increasing proximal stability of RUE and overall general strength.  Patient cont to only be able to place 4 in perfection game this date.. OT Plan: P:  Folllow up on proximal strengthening activities for home, complete perfection with 6 pegs in 10 seconds.    Goals Short Term Goals Short Term Goal 1: Patient will be educated on a HEP. Short  Term Goal 1 Progress: Met Short Term Goal 2: Patient will increase GMC from fair to good - for increased use of his right arm as dominant. Short Term Goal 2 Progress: Met Short Term Goal 3: Patient will increase rate of speed with fine motor tasks by completing the nine hole peg test in 40" or less. Short Term Goal 3 Progress: Met Short Term Goal 4: Patient will be SBA with all BADLs. Short Term Goal 4 Progress: Met Short Term Goal 5: Patient will improve his right grip strength by 15 pounds and pinch strength by 2 pounds for increased ability to open containers. Short Term Goal 5 Progress: Met Long Term Goals Long Term Goal 1: Patient will use his right arm as dominant with all B/IADLs, work, and leisure activities and complete activities at prior level of Independence.  Long Term Goal 1 Progress: Progressing toward goal Long Term Goal 2: Patient will increase GMC from good - to normal for increased use of his right arm as dominant. Long Term Goal 2 Progress: Progressing toward goal Long Term Goal 3: Patient will increase rate of speed with fine motor tasks by completing the nine hole peg test in 30" or less. Long Term Goal 3 Progress: Progressing toward goal Long Term Goal 4: Patient will improve his right grip strength by 30 pounds and pinch strength by 4 pounds for increased ability to open containers. Long Term Goal 4 Progress: Progressing toward goal Long Term Goal 5: Patient will resume driving without modifications.  Long Term Goal 5 Progress: Progressing toward goal  Problem List Patient Active Problem List   Diagnosis Date Noted  . Difficulty  in walking(719.7) 01/25/2013  . CVA (cerebral infarction) 01/22/2013  . Lack of coordination 01/22/2013  . OA (osteoarthritis) of knee 09/05/2011  . Knee pain, right 09/05/2011  . KNEE, ARTHRITIS, DEGEN./OSTEO 01/30/2009  . SHOULDER PAIN 01/16/2009  . KNEE PAIN 01/16/2009  . IMPINGEMENT SYNDROME 01/16/2009  . TENDINITIS, LEFT KNEE  01/16/2009    End of Session Activity Tolerance: Patient tolerated treatment well General Behavior During Therapy: [facility] for tasks assessed/performed  GO    Velora Mediate, OTR/L  02/22/2013, 4:10PM

## 2013-03-01 ENCOUNTER — Ambulatory Visit (HOSPITAL_COMMUNITY)
Admission: RE | Admit: 2013-03-01 | Discharge: 2013-03-01 | Disposition: A | Discharge status: Home / Self Care | Payer: BC Managed Care – PPO | Source: Ambulatory Visit | Attending: Pulmonary Disease | Admitting: Pulmonary Disease

## 2013-03-01 DIAGNOSIS — I639 Cerebral infarction, unspecified: Secondary | ICD-10-CM

## 2013-03-01 DIAGNOSIS — R279 Unspecified lack of coordination: Secondary | ICD-10-CM

## 2013-03-01 DIAGNOSIS — R262 Difficulty in walking, not elsewhere classified: Secondary | ICD-10-CM

## 2013-03-01 NOTE — Progress Notes (Signed)
Physical Therapy Treatment Patient Details  Name: Zachary Murillo MRN: [id number] Date of Birth: 1950-04-06  Today's Date: 03/01/2013 Time: 1345-1430 PT Time Calculation (min): 45 min Charges: Gait: 1345-1358 TE: 1358-1430 Visit#: 16 of 24  Re-eval: 03/15/13   Subjective: Symptoms/Limitations Symptoms: Pt reports that he fell on Saturday when going down the steps to his grill and states that his foot got stuck.  He reports no injury.  He states that he is walking for about 15 minutes now.  Pain Assessment Currently in Pain?: No/denies  Precautions/Restrictions     Exercise/Treatments Aerobic Tread Mill: gait training: 12:30 minutes 1.4 mph w/cueing for leg length and posture Standing Heel Raises: 20 reps;Limitations (foam surface) Heel Raises Limitations: Toe raises: foam surface Knee Flexion: Both;15 reps;Limitations Knee Flexion Limitations: on foam surface Functional Squat: 20 reps;Limitations Functional Squat Limitations: foam surface PT facilation Ankle Exercises - Seated Towel Crunch: 2 reps Marble Pickup: 10 marbles BLE Other Seated Ankle Exercises: Toe Yoga: Great toe flexion with 4 digit extension and oppoiste with PT facilation using VC  Physical Therapy Assessment and Plan PT Assessment and Plan Clinical Impression Statement: Focused on improving foot and toe coordinated movements and strength.  Continued with foam standing activities to improve ankle and hip strategy with intermittent HHA and PT facilation.  Pt unable to fully coordinate toe flexor hallicus and extensor hallicus and coordinated movements.  PT Plan: Add ankle theraband activities to improve ankle strength.  Update with ankle exercises for HEP next visit.     Goals Home Exercise Program Pt/caregiver will Perform Home Exercise Program: Independently;For increased strengthening;For improved balance PT Goal: Perform Home Exercise Program - Progress: Met PT Short Term Goals Time to Complete Short  Term Goals: 3 weeks PT Short Term Goal 1: Pt will improve his prorioceptive awareness and begin ambulating with a single point cane.   PT Short Term Goal 1 - Progress: Met PT Short Term Goal 2: Pt will improve his gait velocity to 3.07 ft/sec with a SPC for normalized age range.  PT Short Term Goal 2 - Progress: Progressing toward goal PT Long Term Goals Time to Complete Long Term Goals:  (6 weeks) PT Long Term Goal 1: Pt will improve his activity tolerance in order to ambulate x15 minutes independently in an indoor and outdoor environment to continue with a walking program for weight loss.  PT Long Term Goal 1 - Progress: Progressing toward goal PT Long Term Goal 2: Pt will improve his Berg Balance Score to 50/56 to decrease risk of falls.  PT Long Term Goal 2 - Progress: Progressing toward goal Long Term Goal 3: Pt will improve his TUG time to less than 13 seconds walking independently in order to safely ambulate in the community.  Long Term Goal 3 Progress: Progressing toward goal Long Term Goal 4: Pt will improve his functional strength and activity tolerance 5 times in 8.4 seconds for normalized age range.  Long Term Goal 4 Progress: Progressing toward goal  Problem List Patient Active Problem List   Diagnosis Date Noted  . Difficulty in walking(719.7) 01/25/2013  . CVA (cerebral infarction) 01/22/2013  . Lack of coordination 01/22/2013  . OA (osteoarthritis) of knee 09/05/2011  . Knee pain, right 09/05/2011  . KNEE, ARTHRITIS, DEGEN./OSTEO 01/30/2009  . SHOULDER PAIN 01/16/2009  . KNEE PAIN 01/16/2009  . IMPINGEMENT SYNDROME 01/16/2009  . TENDINITIS, LEFT KNEE 01/16/2009    PT - End of Session Equipment Utilized During Treatment: Gait belt Activity Tolerance: Patient limited by  fatigue;Patient tolerated treatment well General Behavior During Therapy: [facility] for tasks assessed/performed  GP    Kyen Taite, MPT, ATC 03/01/2013, 2:34 PM

## 2013-03-02 ENCOUNTER — Ambulatory Visit (HOSPITAL_COMMUNITY)
Admission: RE | Admit: 2013-03-02 | Discharge: 2013-03-02 | Disposition: A | Discharge status: Home / Self Care | Payer: BC Managed Care – PPO | Source: Ambulatory Visit | Attending: Pulmonary Disease | Admitting: Pulmonary Disease

## 2013-03-02 DIAGNOSIS — R262 Difficulty in walking, not elsewhere classified: Secondary | ICD-10-CM

## 2013-03-02 DIAGNOSIS — R279 Unspecified lack of coordination: Secondary | ICD-10-CM

## 2013-03-02 DIAGNOSIS — I639 Cerebral infarction, unspecified: Secondary | ICD-10-CM

## 2013-03-02 NOTE — Progress Notes (Signed)
Physical Therapy Treatment Patient Details  Name: Zachary Murillo MRN: [id number] Date of Birth: 01-Feb-1951  Today's Date: 03/02/2013 Time: 4098-1191 PT Time Calculation (min): 40 min Charges: TE: 1435-1500 NMR: 1500-1515 Visit#: 17 of 24  Re-eval: 03/15/13    Authorization:    Authorization Time Period:    Authorization Visit#:   of     Subjective: Symptoms/Limitations Symptoms: Pt wife reports that his BP was low this morning.  Pt reports he is feeling better then he was this morning.   Pain Assessment Currently in Pain?: No/denies  Precautions/Restrictions     Exercise/Treatments Aerobic Exercises Tread Mill: 1.2 mph x13 minutes to improve gait mechanics with VC for posture and stride length Ankle Exercises - Standing Rocker Board: 3 minutes Warrior I: 1 x20 sec holds BLE with min A Other Standing Ankle Exercises: Modified tree pose with toe on ground and arms in front 2x30 sec min A Ankle Exercises - Seated Other Seated Ankle Exercises: on blue ball: shoulder flexion w/blue theraball x10 reps, rotation x10 reps, bouncing on ball 2x30 seconds, weight shifting side to side and front to back.  Ankle Exercises - Supine T-Band: Green theraband 15 reps 4 way BLE  Physical Therapy Assessment and Plan PT Assessment and Plan Clinical Impression Statement: Continued with finishing ankle HEP today. Decreased gait speed on TM due to BP issues this morning. Added standing yoga activities and theraball activities to exercise program today to improve core strength and balance.  PT Plan: Continue to improve functional LE strength, posture and gait mechanics.  Added heel and toe walking if able and progres towards balance beam activities.     Goals Home Exercise Program Pt/caregiver will Perform Home Exercise Program: Independently;For increased strengthening;For improved balance PT Goal: Perform Home Exercise Program - Progress: Met PT Short Term Goals Time to Complete Short Term  Goals: 3 weeks PT Short Term Goal 1: Pt will improve his prorioceptive awareness and begin ambulating with a single point cane.   PT Short Term Goal 1 - Progress: Met PT Short Term Goal 2: Pt will improve his gait velocity to 3.07 ft/sec with a SPC for normalized age range.  PT Short Term Goal 2 - Progress: Progressing toward goal PT Long Term Goals Time to Complete Long Term Goals:  (6 weeks) PT Long Term Goal 1: Pt will improve his activity tolerance in order to ambulate x15 minutes independently in an indoor and outdoor environment to continue with a walking program for weight loss.  PT Long Term Goal 1 - Progress: Progressing toward goal PT Long Term Goal 2: Pt will improve his Berg Balance Score to 50/56 to decrease risk of falls.  PT Long Term Goal 2 - Progress: Progressing toward goal Long Term Goal 3: Pt will improve his TUG time to less than 13 seconds walking independently in order to safely ambulate in the community.  Long Term Goal 3 Progress: Progressing toward goal Long Term Goal 4: Pt will improve his functional strength and activity tolerance 5 times in 8.4 seconds for normalized age range.  Long Term Goal 4 Progress: Progressing toward goal  Problem List Patient Active Problem List   Diagnosis Date Noted  . Difficulty in walking(719.7) 01/25/2013  . CVA (cerebral infarction) 01/22/2013  . Lack of coordination 01/22/2013  . OA (osteoarthritis) of knee 09/05/2011  . Knee pain, right 09/05/2011  . KNEE, ARTHRITIS, DEGEN./OSTEO 01/30/2009  . SHOULDER PAIN 01/16/2009  . KNEE PAIN 01/16/2009  . IMPINGEMENT SYNDROME 01/16/2009  . TENDINITIS,  LEFT KNEE 01/16/2009    PT - End of Session Equipment Utilized During Treatment: Gait belt Activity Tolerance: Patient limited by fatigue;Patient tolerated treatment well General Behavior During Therapy: [facility] for tasks assessed/performed PT Plan of Care PT Home Exercise Plan: updated with ankle HEP PT Patient Instructions: discussed  new ankle HEP with pt and wife Consulted and Agree with Plan of Care: Patient Family Member Consulted: Wife Zachary Murillo)  GP    Zachary Murillo 03/02/2013, 3:40 PM

## 2013-03-03 ENCOUNTER — Ambulatory Visit (HOSPITAL_COMMUNITY)
Admission: RE | Admit: 2013-03-03 | Discharge: 2013-03-03 | Disposition: A | Discharge status: Home / Self Care | Payer: BC Managed Care – PPO | Source: Ambulatory Visit | Attending: Pulmonary Disease | Admitting: Pulmonary Disease

## 2013-03-05 ENCOUNTER — Ambulatory Visit (HOSPITAL_COMMUNITY): Payer: BC Managed Care – PPO | Admitting: Occupational Therapy

## 2013-03-05 ENCOUNTER — Ambulatory Visit (HOSPITAL_COMMUNITY)
Admission: RE | Admit: 2013-03-05 | Discharge: 2013-03-05 | Disposition: A | Discharge status: Home / Self Care | Payer: BC Managed Care – PPO | Source: Ambulatory Visit | Attending: Pulmonary Disease | Admitting: Pulmonary Disease

## 2013-03-05 DIAGNOSIS — R279 Unspecified lack of coordination: Secondary | ICD-10-CM

## 2013-03-05 DIAGNOSIS — I639 Cerebral infarction, unspecified: Secondary | ICD-10-CM

## 2013-03-05 DIAGNOSIS — R262 Difficulty in walking, not elsewhere classified: Secondary | ICD-10-CM

## 2013-03-05 NOTE — Progress Notes (Signed)
Physical Therapy Treatment Patient Details  Name: Zachary Murillo MRN: [id number] Date of Birth: [birth year]  Today's Date: 03/05/2013 Time: 1300-1355 PT Time Calculation (min): 55 min Charge: NMR 1300-1323, TE 1323-1340, Gait 1340-1355  Visit#: 18 of 24  Re-eval: 03/15/13 Assessment Diagnosis: CVA Next MD Visit: Dr. Marlowe Kays  Subjective: Symptoms/Limitations Symptoms: Currently pain free, feels he is making improvements in balance but continues to have Rt foot drop when walking for long periods or time  Pain Assessment Currently in Pain?: No/denies  Precautions/Restrictions  Precautions Precautions: Fall  Exercise/Treatments Aerobic Tread Mill: Gait training at .52 cyc/sec x 15 minutes w/cueing for equal stride length and posture Prone  Other Prone Exercises: quadruped 10x 5" opp arm/let   Balance Exercises Standing Balance Beam: 2RT tandem, retro and sidestepping 3RT  Seated Other Seated Exercises: on blue ball: shoulder flexion w/blue theraball x10 reps, rotation x10 reps, bouncing on ball 2x30 seconds, weight shifting side to side and front to back.    Physical Therapy Assessment and Plan PT Assessment and Plan Clinical Impression Statement: Progressed balance and ankle strenghtening exercises to heel and toe walking with min assistance required.  Pt with increased difficulty wth balance activtieis on dynamic surfaces this sessions, requiring min-mod assistance for LOB episodes.  Added quadruped exercises to improve core strengthening and balance, pt limited by fatigue at end of session.   PT Plan: Continue to improve functional LE strength, posture and gait mechanics.  Continue with balance beam, heel/toe walking and quadruped exercises.      Goals Home Exercise Program Pt/caregiver will Perform Home Exercise Program: Independently;For increased strengthening;For improved balance PT Short Term Goals Time to Complete Short Term Goals: 3 weeks PT Short Term  Goal 1: Pt will improve his prorioceptive awareness and begin ambulating with a single point cane.   PT Short Term Goal 2: Pt will improve his gait velocity to 3.07 ft/sec with a SPC for normalized age range.  PT Short Term Goal 2 - Progress: Progressing toward goal PT Long Term Goals Time to Complete Long Term Goals:  (6 weeks) PT Long Term Goal 1: Pt will improve his activity tolerance in order to ambulate x15 minutes independently in an indoor and outdoor environment to continue with a walking program for weight loss.  PT Long Term Goal 1 - Progress: Progressing toward goal PT Long Term Goal 2: Pt will improve his Berg Balance Score to 50/56 to decrease risk of falls.  PT Long Term Goal 2 - Progress: Progressing toward goal Long Term Goal 3: Pt will improve his TUG time to less than 13 seconds walking independently in order to safely ambulate in the community.  Long Term Goal 4: Pt will improve his functional strength and activity tolerance 5 times in 8.4 seconds for normalized age range.   Problem List Patient Active Problem List   Diagnosis Date Noted  . Difficulty in walking(719.7) 01/25/2013  . CVA (cerebral infarction) 01/22/2013  . Lack of coordination 01/22/2013  . OA (osteoarthritis) of knee 09/05/2011  . Knee pain, right 09/05/2011  . KNEE, ARTHRITIS, DEGEN./OSTEO 01/30/2009  . SHOULDER PAIN 01/16/2009  . KNEE PAIN 01/16/2009  . IMPINGEMENT SYNDROME 01/16/2009  . TENDINITIS, LEFT KNEE 01/16/2009    PT - End of Session Equipment Utilized During Treatment: Gait belt Activity Tolerance: Patient limited by fatigue;Patient tolerated treatment well General Behavior During Therapy: [facility] for tasks assessed/performed  GP    Juel Burrow 03/05/2013, 1:57 PM

## 2013-03-07 ENCOUNTER — Encounter (HOSPITAL_COMMUNITY): Payer: Self-pay | Admitting: Emergency Medicine

## 2013-03-07 ENCOUNTER — Emergency Department (HOSPITAL_COMMUNITY)
Admission: EM | Admit: 2013-03-07 | Discharge: 2013-03-07 | Disposition: A | Discharge status: Home / Self Care | Payer: BC Managed Care – PPO | Attending: Emergency Medicine | Admitting: Emergency Medicine

## 2013-03-07 DIAGNOSIS — E119 Type 2 diabetes mellitus without complications: Secondary | ICD-10-CM | POA: Insufficient documentation

## 2013-03-07 DIAGNOSIS — Z7982 Long term (current) use of aspirin: Secondary | ICD-10-CM | POA: Insufficient documentation

## 2013-03-07 DIAGNOSIS — E876 Hypokalemia: Secondary | ICD-10-CM | POA: Insufficient documentation

## 2013-03-07 DIAGNOSIS — I10 Essential (primary) hypertension: Secondary | ICD-10-CM | POA: Insufficient documentation

## 2013-03-07 DIAGNOSIS — I959 Hypotension, unspecified: Secondary | ICD-10-CM | POA: Insufficient documentation

## 2013-03-07 DIAGNOSIS — E669 Obesity, unspecified: Secondary | ICD-10-CM | POA: Insufficient documentation

## 2013-03-07 DIAGNOSIS — Z79899 Other long term (current) drug therapy: Secondary | ICD-10-CM | POA: Insufficient documentation

## 2013-03-07 DIAGNOSIS — R509 Fever, unspecified: Secondary | ICD-10-CM | POA: Insufficient documentation

## 2013-03-07 DIAGNOSIS — R112 Nausea with vomiting, unspecified: Secondary | ICD-10-CM | POA: Insufficient documentation

## 2013-03-07 DIAGNOSIS — Z7902 Long term (current) use of antithrombotics/antiplatelets: Secondary | ICD-10-CM | POA: Insufficient documentation

## 2013-03-07 DIAGNOSIS — E663 Overweight: Secondary | ICD-10-CM | POA: Insufficient documentation

## 2013-03-07 DIAGNOSIS — Z8673 Personal history of transient ischemic attack (TIA), and cerebral infarction without residual deficits: Secondary | ICD-10-CM | POA: Insufficient documentation

## 2013-03-07 DIAGNOSIS — Z794 Long term (current) use of insulin: Secondary | ICD-10-CM | POA: Insufficient documentation

## 2013-03-07 HISTORY — DX: Cerebral infarction, unspecified: I63.9

## 2013-03-07 LAB — BASIC METABOLIC PANEL
CO2: 30 mEq/L (ref 19–32)
Calcium: 8.7 mg/dL (ref 8.4–10.5)
Chloride: 101 mEq/L (ref 96–112)
GFR calc Af Amer: 87 mL/min — ABNORMAL LOW (ref >90)
Glucose, Bld: 100 mg/dL — ABNORMAL HIGH (ref 70–99)
Potassium: 2.9 mEq/L — ABNORMAL LOW (ref 3.5–5.1)
Sodium: 140 mEq/L (ref 135–145)

## 2013-03-07 LAB — CBC WITH DIFFERENTIAL/PLATELET
Basophils Absolute: 0 10*3/uL (ref 0.0–0.1)
Eosinophils Absolute: 0.3 10*3/uL (ref 0.0–0.7)
Hemoglobin: 13.1 g/dL (ref 13.0–17.0)
Lymphocytes Relative: 19 % (ref 12–46)
Lymphs Abs: 1.3 10*3/uL (ref 0.7–4.0)
Neutrophils Relative %: 62 % (ref 43–77)
Platelets: 179 10*3/uL (ref 150–400)
RBC: 4.43 MIL/uL (ref 4.22–5.81)
RDW: 15.5 % (ref 11.5–15.5)
WBC: 7 10*3/uL (ref 4.0–10.5)

## 2013-03-07 MED ORDER — POTASSIUM CHLORIDE 20 MEQ PO PACK
40.0000 meq | PACK | Freq: Once | ORAL | Status: AC
  Administered 2013-03-07: 40 meq via ORAL
  Filled 2013-03-07: qty 2

## 2013-03-07 NOTE — ED Notes (Addendum)
Pt reports is on high doses of bp medication.  Reports stomach virus has been going around his household.  Reports didn't feel well yesterday and vomited x 1.  Also reports had fever.  Denies diarrhea.  Wife reports pt's bp was 98/47 last night.  Was told by cardiologist to give pt gatorade then retake bp.  BP was 112/57 at 1am.  This morning around 0700 pt got up to the restroom but was too weak after getting off of the commode to walk back to bed.  Wife says bp would not register on his machine.  Denies any pain or weakness.  Pt c/o dizziness with position changes.

## 2013-03-07 NOTE — ED Provider Notes (Signed)
CSN: [id number]     Arrival date & time 03/07/13  8469 History   First MD Initiated Contact with Patient 03/07/13 1259     Chief Complaint  Patient presents with  . Hypotension   (Consider location/radiation/quality/duration/timing/severity/associated sxs/prior Treatment) HPI..... nausea, vomiting, low-grade fever yesterday. Blood pressure 90/47 last night. Patient is on several blood pressure medications. Felt very weak this morning getting off the commode. Feels much better now. Able to stand and ambulate. No chest pain, shortness of breath, neurological deficits, dysuria,  chills  Past Medical History  Diagnosis Date  . Diabetes mellitus   . Sleep apnea   . Overweight(278.02)   . Hypertension   . Stroke    Past Surgical History  Procedure Laterality Date  . Knee arthroscopy    . Nasal septum surgery    . Cardiac catheterization     Family History  Problem Relation Age of Onset  . Heart disease    . Arthritis    . Cancer    . Asthma    . Diabetes     History  Substance Use Topics  . Smoking status: Never Smoker   . Smokeless tobacco: Not on file  . Alcohol Use: Yes     Comment: occ    Review of Systems  All other systems reviewed and are negative.    Allergies  Review of patient's allergies indicates no known allergies.  Home Medications   Current Outpatient Rx  Name  Route  Sig  Dispense  Refill  . acetaminophen (TYLENOL) 500 MG tablet   Oral   Take 1,500 mg by mouth daily as needed for mild pain, moderate pain or headache.         Marland Kitchen amLODipine (NORVASC) 10 MG tablet   Oral   Take 10 mg by mouth every morning.         . Ascorbic Acid (VITAMIN C) 1000 MG tablet   Oral   Take 1,000 mg by mouth every morning.         Marland Kitchen aspirin EC 81 MG tablet   Oral   Take 81 mg by mouth 2 (two) times daily.          Marland Kitchen atorvastatin (LIPITOR) 20 MG tablet   Oral   Take 20 mg by mouth at bedtime.         . Canagliflozin (INVOKANA) 300 MG TABS   Oral  Take 300 mg by mouth every morning.         . carvedilol (COREG) 25 MG tablet   Oral   Take 25 mg by mouth 2 (two) times daily with a meal.         . cetirizine (ZYRTEC) 10 MG tablet   Oral   Take 10 mg by mouth daily as needed for allergies.          . cloNIDine (CATAPRES) 0.2 MG tablet   Oral   Take 0.2 mg by mouth 3 (three) times daily.         . clopidogrel (PLAVIX) 75 MG tablet   Oral   Take 75 mg by mouth daily with breakfast.         . colesevelam (WELCHOL) 625 MG tablet   Oral   Take 1,875 mg by mouth 2 (two) times daily.          . furosemide (LASIX) 40 MG tablet   Oral   Take 40 mg by mouth daily as needed for edema.         Marland Kitchen  Insulin Detemir (LEVEMIR FLEXPEN) 100 UNIT/ML SOPN   Subcutaneous   Inject 10 Units into the skin at bedtime.         . Liraglutide (VICTOZA) 18 MG/3ML SOPN   Subcutaneous   Inject 1.2 mcg into the skin at bedtime.         Marland Kitchen lisinopril (PRINIVIL,ZESTRIL) 40 MG tablet   Oral   Take 40 mg by mouth every morning.         . metFORMIN (GLUCOPHAGE) 1000 MG tablet   Oral   Take 1,000 mg by mouth 2 (two) times daily with a meal.         . minoxidil (LONITEN) 10 MG tablet   Oral   Take 10 mg by mouth every morning.         . Multiple Vitamins-Minerals (CENTRUM PO)   Oral   Take 1 tablet by mouth every morning.         . potassium chloride SA (K-DUR,KLOR-CON) 20 MEQ tablet   Oral   Take 20 mEq by mouth 3 (three) times daily.         . sertraline (ZOLOFT) 50 MG tablet   Oral   Take 1 tablet (50 mg total) by mouth daily.   30 tablet   12   . sodium chloride (OCEAN) 0.65 % nasal spray   Nasal   Place 1 spray into the nose as needed for congestion.         Marland Kitchen terazosin (HYTRIN) 10 MG capsule   Oral   Take 10 mg by mouth at bedtime.         . triamterene-hydrochlorothiazide (MAXZIDE) 75-50 MG per tablet   Oral   Take 1 tablet by mouth every morning.         . vitamin B-12 (CYANOCOBALAMIN) 1000  MCG tablet   Oral   Take 1,000 mcg by mouth every morning.          BP 137/77  Pulse 72  Temp(Src) 98.3 F (36.8 C) (Oral)  Resp 20  Ht 5\' 10"  (1.778 m)  Wt 293 lb (132.904 kg)  BMI 42.04 kg/m2  SpO2 94% Physical Exam  Nursing note and vitals reviewed. Constitutional: He is oriented to person, place, and time. He appears well-developed and well-nourished.  Obese, good color  HENT:  Head: Normocephalic and atraumatic.  Eyes: Conjunctivae and EOM are normal. Pupils are equal, round, and reactive to light.  Neck: Normal range of motion. Neck supple.  Cardiovascular: Normal rate, regular rhythm and normal heart sounds.   Pulmonary/Chest: Effort normal and breath sounds normal.  Abdominal: Soft. Bowel sounds are normal.  Musculoskeletal: Normal range of motion.  Neurological: He is alert and oriented to person, place, and time.  Skin: Skin is warm and dry.  Psychiatric: He has a normal mood and affect. His behavior is normal.    ED Course  Procedures (including critical care time) Labs Review Labs Reviewed  CBC WITH DIFFERENTIAL - Abnormal; Notable for the following:    HCT 37.8 (*)    Monocytes Relative 14 (*)    All other components within normal limits  BASIC METABOLIC PANEL - Abnormal; Notable for the following:    Potassium 2.9 (*)    Glucose, Bld 100 (*)    GFR calc non Af Amer 75 (*)    GFR calc Af Amer 87 (*)    All other components within normal limits   Imaging Review No results found.  EKG Interpretation   None  MDM   1. Hypotension   2. Hypokalemia     Patient feels much better. Wife confirms.  Low potassium noted. Replacement given. Patient has primary care followup and an appt with the nephrologist at [facility] this week.    Donnetta Hutching, MD 03/07/13 1357

## 2013-03-08 ENCOUNTER — Ambulatory Visit (HOSPITAL_COMMUNITY)
Admission: RE | Admit: 2013-03-08 | Discharge: 2013-03-08 | Disposition: A | Discharge status: Home / Self Care | Payer: BC Managed Care – PPO | Source: Ambulatory Visit | Attending: Pulmonary Disease | Admitting: Pulmonary Disease

## 2013-03-08 ENCOUNTER — Inpatient Hospital Stay (HOSPITAL_COMMUNITY)
Admission: RE | Admit: 2013-03-08 | Discharge: 2013-03-08 | Disposition: A | Discharge status: Home / Self Care | Payer: BC Managed Care – PPO | Source: Ambulatory Visit | Attending: Specialist | Admitting: Specialist

## 2013-03-08 NOTE — Progress Notes (Signed)
Occupational Therapy Treatment Patient Details  Name: Zachary Murillo MRN: [id number] Date of Birth: 10/22/1950  Today's Date: 03/03/2013 Time: 4098-1191 OT Time Calculation (min): 45 min Neuro Re-education 4782-9562 (15') TherExercises 1308-6578 (30')  Visit#: 15 of 18  Re-eval: 03/17/13    Subjective Symptoms/Limitations Symptoms: S:  My arm is still tired from the other day when we worked it....  Pain Assessment Currently in Pain?: No/denies   Exercise/Treatments Seated Other Seated Exercises: shoulder arc x 15 rings over and back with rest break x 3 with 2# weight on wrist.  Standing Horizontal ABduction: Theraband;12 reps Theraband Level (Shoulder Horizontal ABduction): Level 3 (Green) External Rotation: Theraband;12 reps Theraband Level (Shoulder External Rotation): Level 3 (Green) Internal Rotation: Theraband;12 reps Theraband Level (Shoulder Internal Rotation): Level 3 (Green) Flexion: Theraband;12 reps Theraband Level (Shoulder Flexion): Level 3 (Green) Extension: Theraband;12 reps Theraband Level (Shoulder Extension): Level 3 (Green) Row: Theraband;12 reps Other Standing Exercises: standing on foam square to complete peg board on wall with 2# weight on wrist - completed every othe hole x 4 rows place and remove.   ROM / Strengthening / Isometric Strengthening UBE (Upper Arm Bike): 3' and 3' at 3.0       Hand Exercises Theraputty: Flatten;Roll;Grip (putty press with 3/4 " dowel x 20 ) Other Hand Exercises: perfection best of 5 was 5 pieces in 10 seconds using sides of pieces vs handles to manipulate pieces  Other Hand Exercises: clothes pin activity to attain and place firm sponge blocks with blue pin x 25        Occupational Therapy Assessment and Plan OT Assessment and Plan Clinical Impression Statement: A:  Strengthening of  RUE with green theraband this date with good tolerance and increased complaint of faitgue at end of session.  Patient with mod  difficulty with blue clothes pin and firm squrares as activity progressed with increasd complaint of fatigue.   OT Plan: P:   Radial deviation/extension with grip activity, increase UE endurance for functional activities.    Goals Short Term Goals Short Term Goal 1: Patient will be educated on a HEP. Short Term Goal 1 Progress: Met Short Term Goal 2: Patient will increase GMC from fair to good - for increased use of his right arm as dominant. Short Term Goal 2 Progress: Met Short Term Goal 3: Patient will increase rate of speed with fine motor tasks by completing the nine hole peg test in 40" or less. Short Term Goal 3 Progress: Met Short Term Goal 4 Progress: Met Short Term Goal 5: Patient will improve his right grip strength by 15 pounds and pinch strength by 2 pounds for increased ability to open containers. Short Term Goal 5 Progress: Met Long Term Goals Long Term Goal 1: Patient will use his right arm as dominant with all B/IADLs, work, and leisure activities and complete activities at prior level of Independence.  Long Term Goal 1 Progress: Progressing toward goal Long Term Goal 2: Patient will increase GMC from good - to normal for increased use of his right arm as dominant. Long Term Goal 2 Progress: Progressing toward goal Long Term Goal 3: Patient will increase rate of speed with fine motor tasks by completing the nine hole peg test in 30" or less. Long Term Goal 3 Progress: Progressing toward goal Long Term Goal 4: Patient will improve his right grip strength by 30 pounds and pinch strength by 4 pounds for increased ability to open containers. Long Term Goal 4 Progress: Progressing toward  goal Long Term Goal 5: Patient will resume driving without modifications.  Long Term Goal 5 Progress: Progressing toward goal  Problem List Patient Active Problem List   Diagnosis Date Noted  . Difficulty in walking(719.7) 01/25/2013  . CVA (cerebral infarction) 01/22/2013  . Lack of  coordination 01/22/2013  . OA (osteoarthritis) of knee 09/05/2011  . Knee pain, right 09/05/2011  . KNEE, ARTHRITIS, DEGEN./OSTEO 01/30/2009  . SHOULDER PAIN 01/16/2009  . KNEE PAIN 01/16/2009  . IMPINGEMENT SYNDROME 01/16/2009  . TENDINITIS, LEFT KNEE 01/16/2009    End of Session Activity Tolerance: Patient tolerated treatment well General Behavior During Therapy: [facility] for tasks assessed/performed  GO    Velora Mediate, OTR/L 03/03/2013, 11:42 AM

## 2013-03-08 NOTE — Progress Notes (Signed)
Occupational Therapy Treatment Patient Details  Name: Zachary Murillo MRN: [id number] Date of Birth: November 10, 1950  Today's Date: 03/08/2013 Time: 1350-1435 OT Time Calculation (min): 45 min Therapeutic Exercises -  1350-1425 (35') Neuromuscular Re-Education 1425-1435 (10')  Visit#: 16 of 18  Re-eval: 03/17/13   Subjective S: No, I didn't have any pain coming in. But I will be sore now." Pain Assessment Currently in Pain?: No/denies  Exercise/Treatments       03/08/13 1445  Shoulder Exercises: Seated  Other Seated Exercises shoulder arc w medium extension x 18 rings over and back with rest break x 4 with 2# weight on wrist.   Shoulder Exercises: Standing  Other Standing Exercises standing on foam square to complete peg board on wall with 2# weight on wrist - completed every othe hole x 4 rows place and remove.    Additional Elbow Exercises  Theraputty Flatten;Roll;Grip (putty press x 25)   Occupational Therapy Assessment and Plan OT Assessment and Plan Clinical Impression Statement: A: Pt demonstrated continued low endurance during RUE tasks. Pt denied pain during tx, but was fatigued and sore at end of session. Shoulder arc rings increased from 15 to 18. OT Plan: Include radial deviation/extension during grip activity, increase endurance for UE functional tasks   Goals Long Term Goals Long Term Goal 1 Progress: Progressing toward goal Long Term Goal 2 Progress: Progressing toward goal Long Term Goal 3 Progress: Progressing toward goal Long Term Goal 4 Progress: Progressing toward goal Long Term Goal 5 Progress: Progressing toward goal  Problem List Patient Active Problem List   Diagnosis Date Noted  . Difficulty in walking(719.7) 01/25/2013  . CVA (cerebral infarction) 01/22/2013  . Lack of coordination 01/22/2013  . OA (osteoarthritis) of knee 09/05/2011  . Knee pain, right 09/05/2011  . KNEE, ARTHRITIS, DEGEN./OSTEO 01/30/2009  . SHOULDER PAIN 01/16/2009  . KNEE  PAIN 01/16/2009  . IMPINGEMENT SYNDROME 01/16/2009  . TENDINITIS, LEFT KNEE 01/16/2009    End of Session Activity Tolerance: Patient tolerated treatment well General Behavior During Therapy: [facility] for tasks assessed/performed  GO    Shirlean Mylar, OTR/L  03/08/2013, 2:54 PM

## 2013-03-08 NOTE — Progress Notes (Signed)
Occupational Therapy Treatment Patient Details  Name: Zachary Murillo MRN: [id number] Date of Birth: [birth year]  Today's Date: 03/01/2013 Time: 7829-5621 OT Time Calculation (min): 47 min TherExercises 1433-1500 (27') Neuro Re-Education 1500-1520 (20')  Visit#: 14 of 18  Re-eval: 03/17/13    Authorization:    Authorization Time Period:    Authorization Visit#:   of    Subjective Symptoms/Limitations Symptoms: S:  My arm still kind of flops down when Im not using it and it tends to pull to the right when I am trying to drive still....  Pain Assessment Currently in Pain?: No/denies  Precautions/Restrictions     Exercise/Treatments Standing Other Standing Exercises: standing on foam square to complete peg board on wall with 2# weight on wrist - completed every othe hole x 4 rows place and remove.    Wrist Exercises Wrist Extension: Strengthening;Theraband;15 reps Theraband Level (Wrist Extension): Level 3 (Green) Wrist Radial Deviation: Strengthening;Theraband;15 reps Theraband Level (Radial Deviation): Level 3 (Green)   Theraputty: Flatten;Roll;Grip;Pinch (putty press x 20 )     Hand Exercises Theraputty: Flatten;Roll;Grip;Pinch (putty press x 20 ) Other Hand Exercises: perfection best of 5 was 5 pieces in 10 seconds using sides of pieces vs handles to manipulate pieces         Occupational Therapy Assessment and Plan OT Assessment and Plan Clinical Impression Statement: A:  Continued strengthening of right wrist;  reaching activity with weighted RUE with rest breaks at end of each row totaling 4.  Continue difficulty with coordination as noted with perfecion game.   OT Plan: P:  Follow up on wrist strengthening. incorporate radial deviation/extension with grip activity.   Goals Short Term Goals Short Term Goal 1: Patient will be educated on a HEP. Short Term Goal 1 Progress: Met Short Term Goal 2: Patient will increase GMC from fair to good - for increased use of  his right arm as dominant. Short Term Goal 2 Progress: Met Short Term Goal 3: Patient will increase rate of speed with fine motor tasks by completing the nine hole peg test in 40" or less. Short Term Goal 3 Progress: Met Short Term Goal 4 Progress: Met Short Term Goal 5: Patient will improve his right grip strength by 15 pounds and pinch strength by 2 pounds for increased ability to open containers. Short Term Goal 5 Progress: Met Long Term Goals Long Term Goal 1: Patient will use his right arm as dominant with all B/IADLs, work, and leisure activities and complete activities at prior level of Independence.  Long Term Goal 1 Progress: Progressing toward goal Long Term Goal 2: Patient will increase GMC from good - to normal for increased use of his right arm as dominant. Long Term Goal 2 Progress: Progressing toward goal Long Term Goal 3: Patient will increase rate of speed with fine motor tasks by completing the nine hole peg test in 30" or less. Long Term Goal 3 Progress: Progressing toward goal Long Term Goal 4: Patient will improve his right grip strength by 30 pounds and pinch strength by 4 pounds for increased ability to open containers. Long Term Goal 4 Progress: Progressing toward goal Long Term Goal 5: Patient will resume driving without modifications.  Long Term Goal 5 Progress: Progressing toward goal  Problem List Patient Active Problem List   Diagnosis Date Noted  . Difficulty in walking(719.7) 01/25/2013  . CVA (cerebral infarction) 01/22/2013  . Lack of coordination 01/22/2013  . OA (osteoarthritis) of knee 09/05/2011  . Knee pain, right  09/05/2011  . KNEE, ARTHRITIS, DEGEN./OSTEO 01/30/2009  . SHOULDER PAIN 01/16/2009  . KNEE PAIN 01/16/2009  . IMPINGEMENT SYNDROME 01/16/2009  . TENDINITIS, LEFT KNEE 01/16/2009    End of Session Activity Tolerance: Patient tolerated treatment well General Behavior During Therapy: [facility] for tasks assessed/performed  GO    Whisper Kurka,OTR/L  03/01/2013, 4:27 PM

## 2013-03-09 ENCOUNTER — Ambulatory Visit (HOSPITAL_COMMUNITY)
Admission: RE | Admit: 2013-03-09 | Discharge: 2013-03-09 | Disposition: A | Discharge status: Home / Self Care | Payer: BC Managed Care – PPO | Source: Ambulatory Visit | Attending: Pulmonary Disease | Admitting: Pulmonary Disease

## 2013-03-09 DIAGNOSIS — R262 Difficulty in walking, not elsewhere classified: Secondary | ICD-10-CM

## 2013-03-09 DIAGNOSIS — I639 Cerebral infarction, unspecified: Secondary | ICD-10-CM

## 2013-03-09 DIAGNOSIS — R279 Unspecified lack of coordination: Secondary | ICD-10-CM

## 2013-03-09 NOTE — Progress Notes (Signed)
Physical Therapy Treatment Patient Details  Name: Zachary Murillo MRN: [id number] Date of Birth: December 10, 1950  Today's Date: 03/09/2013 Time: 1430-1528 PT Time Calculation (min): 58 min Charge: NMR 1430-1455, TE 1455-1515, Gait [phone]  Visit#: 19 of 24  Re-eval: 03/15/13 Assessment Diagnosis: CVA Next MD Visit: Dr. Marlowe Kays  Subjective: Symptoms/Limitations Symptoms: Pt brought in his therapeutic ball, currently pain free today.  Reports most difficulty with balance and Rt foot drop, wife reports improved gait around the house without AD Pain Assessment Currently in Pain?: No/denies  Precautions/Restrictions  Precautions Precautions: Fall  Exercise/Treatments Aerobic Tread Mill: Gait training at .52 cyc/sec x 2 minutes prior outdoor gait training Standing Wall Squat: 10 reps;3 seconds;Limitations Wall Squat Limitations: with therapeutic ball Gait Training: Outdoor gait training from [phone] from hospital to MD office, curbs, slopes, stairs, grass  Prone  Other Prone Exercises: quadruped 10x 5" opp arm/let   Balance Exercises Standing Balance Beam: 2RT tandem, retro and sidestepping with squats, balance beam with 6 and 12in hurdles alternating 2RT  Seated Other Seated Exercises: on pt.'s therapeutic ball shoulder flexion, postural awareness, dead bug, LAQ, marching 10reps each   Physical Therapy Assessment and Plan PT Assessment and Plan Clinical Impression Statement: Progressed to outdoor gait training for longer duration, pt required cueing for sequencing with SPC and Rt UE with gait.  No LOB episodes noted, pt very limited by fatigue following.  Progressed balance and core strengthening activities with min assistance required for LOB episodes on dynamic surface.   PT Plan: Continue to improve functional LE strength, posture and gait mechanics.  Continue with balance beam, heel/toe walking and quadruped exercises.      Goals Home Exercise  Program Pt/caregiver will Perform Home Exercise Program: Independently;For increased strengthening;For improved balance PT Short Term Goals Time to Complete Short Term Goals: 3 weeks PT Short Term Goal 1: Pt will improve his prorioceptive awareness and begin ambulating with a single point cane.   PT Short Term Goal 2: Pt will improve his gait velocity to 3.07 ft/sec with a SPC for normalized age range.  PT Short Term Goal 2 - Progress: Progressing toward goal PT Long Term Goals Time to Complete Long Term Goals:  (6 weeks) PT Long Term Goal 1: Pt will improve his activity tolerance in order to ambulate x15 minutes independently in an indoor and outdoor environment to continue with a walking program for weight loss.  PT Long Term Goal 1 - Progress: Progressing toward goal PT Long Term Goal 2: Pt will improve his Berg Balance Score to 50/56 to decrease risk of falls.  PT Long Term Goal 2 - Progress: Progressing toward goal Long Term Goal 3: Pt will improve his TUG time to less than 13 seconds walking independently in order to safely ambulate in the community.  Long Term Goal 4: Pt will improve his functional strength and activity tolerance 5 times in 8.4 seconds for normalized age range.  Long Term Goal 4 Progress: Progressing toward goal  Problem List Patient Active Problem List   Diagnosis Date Noted  . Difficulty in walking(719.7) 01/25/2013  . CVA (cerebral infarction) 01/22/2013  . Lack of coordination 01/22/2013  . OA (osteoarthritis) of knee 09/05/2011  . Knee pain, right 09/05/2011  . KNEE, ARTHRITIS, DEGEN./OSTEO 01/30/2009  . SHOULDER PAIN 01/16/2009  . KNEE PAIN 01/16/2009  . IMPINGEMENT SYNDROME 01/16/2009  . TENDINITIS, LEFT KNEE 01/16/2009    PT - End of Session Equipment Utilized During Treatment: Gait belt Activity Tolerance: Patient limited by  fatigue;Patient tolerated treatment well General Behavior During Therapy: [facility] for tasks assessed/performed  GP     Juel Burrow 03/09/2013, 4:25 PM

## 2013-03-10 ENCOUNTER — Ambulatory Visit (HOSPITAL_COMMUNITY): Payer: BC Managed Care – PPO | Admitting: Occupational Therapy

## 2013-03-12 ENCOUNTER — Ambulatory Visit (HOSPITAL_COMMUNITY)
Admission: RE | Admit: 2013-03-12 | Discharge: 2013-03-12 | Disposition: A | Discharge status: Home / Self Care | Payer: BC Managed Care – PPO | Source: Ambulatory Visit | Attending: Pulmonary Disease | Admitting: Pulmonary Disease

## 2013-03-12 DIAGNOSIS — R279 Unspecified lack of coordination: Secondary | ICD-10-CM

## 2013-03-12 DIAGNOSIS — I639 Cerebral infarction, unspecified: Secondary | ICD-10-CM

## 2013-03-12 DIAGNOSIS — M6281 Muscle weakness (generalized): Secondary | ICD-10-CM | POA: Insufficient documentation

## 2013-03-12 DIAGNOSIS — IMO0001 Reserved for inherently not codable concepts without codable children: Secondary | ICD-10-CM | POA: Insufficient documentation

## 2013-03-12 DIAGNOSIS — I69993 Ataxia following unspecified cerebrovascular disease: Secondary | ICD-10-CM | POA: Insufficient documentation

## 2013-03-12 DIAGNOSIS — R262 Difficulty in walking, not elsewhere classified: Secondary | ICD-10-CM

## 2013-03-12 DIAGNOSIS — I69998 Other sequelae following unspecified cerebrovascular disease: Secondary | ICD-10-CM | POA: Insufficient documentation

## 2013-03-12 NOTE — Progress Notes (Signed)
Occupational Therapy Treatment Patient Details  Name: Zachary Murillo MRN: [id number] Date of Birth: 1950-04-03  Today's Date: 03/12/2013 Time: 5537-4827 OT Time Calculation (min): 38 min Therapeutic Exercise 0786-7544 (26') Neuromuscualar Re-ed: 9201-0071 (12')  Visit#: 17 of 18  Re-eval: 03/17/13   Subjective Symptoms/Limitations Symptoms: "I've been working on my bands at home, and with my cane. I can tell I'm getting there." Pain Assessment Currently in Pain?: No/denies  Exercise/Treatments     03/12/13 1427  Shoulder Exercises: Standing  Other Standing Exercises Standing w shoulder arc  at waist height and long extension x 18 rings over and back with rest break x 2 with 2# weight on wrist; standing on foam square to complete peg board on wall with 2# weight on wrist - completed every other hole x 4 rows place and remove.    Wrist Exercises  Other wrist exercises pick up, turn over, and return stack of 6 cones to encourage extension/radial deviation  Fine Motor Coordination  Fine Motor Coordination Small Pegboard (pt completed 9-whole-peg test  - 34s and 29s)  Hand Exercises  Other Hand Exercises handwriting - pt demonstrated ability to write name x8 and phrases that would be used in note writing at home.   Other Hand Exercises clothes pin activity to attain and place firm sponge blocks with blue pin x 25    Occupational Therapy Assessment and Plan OT Assessment and Plan Clinical Impression Statement: A: Pt demonstrating improved endurance in weighted RUE activities. Pt's handwritining has good legibility and pt utilizes appropriate tripod grasp. OT Plan: P: Re-assessment next session. Increase wrist weight to 2.5lbs.   Goals Short Term Goals Short Term Goal 1: Patient will be educated on a HEP. Short Term Goal 1 Progress: Met Short Term Goal 2: Patient will increase GMC from fair to good - for increased use of his right arm as dominant. Short Term Goal 2 Progress:  Met Short Term Goal 3: Patient will increase rate of speed with fine motor tasks by completing the nine hole peg test in 40" or less. Short Term Goal 3 Progress: Met Short Term Goal 4: Patient will be SBA with all BADLs. Short Term Goal 4 Progress: Met Short Term Goal 5: Patient will improve his right grip strength by 15 pounds and pinch strength by 2 pounds for increased ability to open containers. Short Term Goal 5 Progress: Met Long Term Goals Long Term Goal 1: Patient will use his right arm as dominant with all B/IADLs, work, and leisure activities and complete activities at prior level of Independence.  Long Term Goal 1 Progress: Progressing toward goal Long Term Goal 2: Patient will increase GMC from good - to normal for increased use of his right arm as dominant. Long Term Goal 2 Progress: Progressing toward goal Long Term Goal 3: Patient will increase rate of speed with fine motor tasks by completing the nine hole peg test in 30" or less. Long Term Goal 3 Progress: Met Long Term Goal 4: Patient will improve his right grip strength by 30 pounds and pinch strength by 4 pounds for increased ability to open containers. Long Term Goal 4 Progress: Progressing toward goal Long Term Goal 5: Patient will resume driving without modifications.  Long Term Goal 5 Progress: Progressing toward goal  Problem List Patient Active Problem List   Diagnosis Date Noted  . Difficulty in walking(719.7) 01/25/2013  . CVA (cerebral infarction) 01/22/2013  . Lack of coordination 01/22/2013  . OA (osteoarthritis) of knee 09/05/2011  .  Knee pain, right 09/05/2011  . KNEE, ARTHRITIS, DEGEN./OSTEO 01/30/2009  . SHOULDER PAIN 01/16/2009  . KNEE PAIN 01/16/2009  . IMPINGEMENT SYNDROME 01/16/2009  . TENDINITIS, LEFT KNEE 01/16/2009    End of Session Activity Tolerance: Patient tolerated treatment well General Behavior During Therapy: [facility] for tasks assessed/performed  GO   Bea Graff, MS,  OTR/L [phone] 03/12/2013, 2:32 PM

## 2013-03-12 NOTE — Progress Notes (Signed)
Physical Therapy Treatment Patient Details  Name: Zachary Murillo MRN: [id number] Date of Birth: Jul 25, 1950  Today's Date: 03/12/2013 Time: 6237-6283 PT Time Calculation (min): 39 min Charge:Gait 1517-6160, NMR 1413-1431  Visit#: 20 of 24  Re-eval: 03/15/13 Assessment Diagnosis: CVA Next MD Visit: Dr. Candace Cruise  Subjective: Symptoms/Limitations Symptoms: Pt reports sitting on therapeutic ball at home for 30 minutes to watch TV.   Pain Assessment Currently in Pain?: No/denies  Precautions/Restrictions  Precautions Precautions: Fall  Exercise/Treatments Standing Heel Raises: Limitations Heel Raises Limitations: heel and toe walking 2RT Walking with Sports Cord: 2RT with thick sport cord Gait Training: Dynamic gait training to improve gait velocity and speed control, min cueing for Rt UE sequencing.   Other Standing Knee Exercises: sled 3RT, 1RT with 110# Other Standing Knee Exercises: TUG in 7.1 seconds max of 3 Prone  Other Prone Exercises: quadruped 10x 5" opp arm/let  Balance Exercises Standing Balance Beam: 3RTbalance beam with 6 and 12in hurdles alternating, retro gait 3RT on balance beam   Physical Therapy Assessment and Plan PT Assessment and Plan Clinical Impression Statement: Session focus on improving dynamic gait mechanics and functional strengthening to improve balance. Pt able to complete TUG in 7 seconds this session and improve sequencing with UE and gait. Pt with less assistance required today on balance beam, able to stabiilize with less assistance or cueing required. Pt limited by fatigue with new activities. PT Plan: Continue to improve functional LE strength, posture and gait mechanics.  Continue with balance beam, heel/toe walking and quadruped exercises.      Goals Home Exercise Program Pt/caregiver will Perform Home Exercise Program: Independently;For increased strengthening;For improved balance PT Short Term Goals Time to Complete Short Term  Goals: 3 weeks PT Short Term Goal 1: Pt will improve his prorioceptive awareness and begin ambulating with a single point cane.   PT Short Term Goal 1 - Progress: Met PT Short Term Goal 2: Pt will improve his gait velocity to 3.07 ft/sec with a SPC for normalized age range.  PT Short Term Goal 2 - Progress: Progressing toward goal PT Long Term Goals Time to Complete Long Term Goals:  (6 weeks) PT Long Term Goal 1: Pt will improve his activity tolerance in order to ambulate x15 minutes independently in an indoor and outdoor environment to continue with a walking program for weight loss.  PT Long Term Goal 1 - Progress: Progressing toward goal PT Long Term Goal 2: Pt will improve his Berg Balance Score to 50/56 to decrease risk of falls.  PT Long Term Goal 2 - Progress: Progressing toward goal Long Term Goal 3: Pt will improve his TUG time to less than 13 seconds walking independently in order to safely ambulate in the community.  Long Term Goal 3 Progress: Met Long Term Goal 4: Pt will improve his functional strength and activity tolerance 5 times in 8.4 seconds for normalized age range.  Long Term Goal 4 Progress: Progressing toward goal  Problem List Patient Active Problem List   Diagnosis Date Noted  . Difficulty in walking(719.7) 01/25/2013  . CVA (cerebral infarction) 01/22/2013  . Lack of coordination 01/22/2013  . OA (osteoarthritis) of knee 09/05/2011  . Knee pain, right 09/05/2011  . KNEE, ARTHRITIS, DEGEN./OSTEO 01/30/2009  . SHOULDER PAIN 01/16/2009  . KNEE PAIN 01/16/2009  . IMPINGEMENT SYNDROME 01/16/2009  . TENDINITIS, LEFT KNEE 01/16/2009    PT - End of Session Equipment Utilized During Treatment: Gait belt Activity Tolerance: Patient limited by fatigue;Patient tolerated  treatment well  GP    Aldona Lento 03/12/2013, 6:40 PM

## 2013-03-16 ENCOUNTER — Ambulatory Visit (HOSPITAL_COMMUNITY)
Admission: RE | Admit: 2013-03-16 | Discharge: 2013-03-16 | Disposition: A | Discharge status: Home / Self Care | Payer: BC Managed Care – PPO | Source: Ambulatory Visit | Attending: Pulmonary Disease | Admitting: Pulmonary Disease

## 2013-03-16 DIAGNOSIS — I639 Cerebral infarction, unspecified: Secondary | ICD-10-CM

## 2013-03-16 DIAGNOSIS — R262 Difficulty in walking, not elsewhere classified: Secondary | ICD-10-CM

## 2013-03-16 DIAGNOSIS — R279 Unspecified lack of coordination: Secondary | ICD-10-CM

## 2013-03-16 NOTE — Evaluation (Signed)
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 Diabetes mellitus   . Sleep apnea   . Overweight(278.02)   . Hypertension   . Stroke    Past Surgical History:  Past Surgical History  Procedure Laterality Date  . Knee arthroscopy    . Nasal septum surgery    . Cardiac catheterization      Subjective Symptoms/Limitations Symptoms: "I feel like I'm using my right hand for pretty much everything." Pain Assessment Currently in Pain?: No/denies  Assessment  Current (Previous 02/18/13) Sensation/Coordination/Edema Coordination Gross Motor Movements are Fluid and Coordinated: Yes Fine Motor Movements are Fluid and Coordinated: Yes 9 Hole Peg Test: R: 26 seconds (37 seconds)  Additional Assessments RUE Strength Grip (lbs): 71 ((66)) Lateral Pinch: 25 lbs ((23)) 3 Point Pinch: 21 lbs ((15)) Right Hand Strength - Pinch (lbs) Lateral Pinch: 25 lbs ((23)) 3 Point Pinch: 21 lbs ((15))   Occupational Therapy Assessment and Plan OT Assessment and Plan Clinical Impression Statement  A: Pt has met all STG and LTG. Pt reports increased functional use of RUE, on par w previous abilities.  Reviewed energy conservation strategies w pt to maximize pts ability to engage in preferred tasks. Encouraged pt to particpate in hobby and leisure activities. Pt is discharged for OT services at this time. OT Plan: P: Pt is discharged from skilled OT intervention this date, w HEP for continued strengthening and energy conservation.   Goals Short Term Goals Short Term Goal 1: Patient will be educated on a HEP. Short Term Goal 1 Progress:  Met Short Term Goal 2: Patient will increase GMC from fair to good - for increased use of his right arm as dominant. Short Term Goal 2 Progress: Met Short Term Goal 3: Patient will increase rate of speed with fine motor tasks by completing the nine hole peg test in 40" or less. Short Term Goal 3 Progress: Met Short Term Goal 4: Patient will be SBA with all BADLs. Short Term Goal 4 Progress: Met Short Term Goal 5: Patient will improve his right grip strength by 15 pounds and pinch strength by 2 pounds for increased ability to open containers. Short Term Goal 5 Progress: Met Long Term Goals Long Term Goal 1: Patient will use his right arm as dominant with all B/IADLs, work, and leisure activities and complete activities at prior level of [place].  Long Term Goal 1 Progress: Met Long Term Goal 2: Patient will increase GMC from good - to normal for increased use of his right arm as dominant. Long Term Goal 2 Progress: Met Long Term Goal 3: Patient will increase rate of speed with fine motor tasks by completing the nine hole peg test in 30" or less. Long Term Goal 3 Progress: Met Long Term Goal 4: Patient will improve his right grip strength by 30 pounds and pinch strength by 4 pounds for increased ability to open containers. Long Term Goal 4 Progress: Met Long Term Goal 5: Patient will resume driving without modifications.  Long Term Goal 5 Progress: Met  Problem List Patient Active Problem List   Diagnosis Date Noted  . Difficulty in walking(719.7) 01/25/2013  . CVA (cerebral infarction) 01/22/2013  .  Lack of coordination 01/22/2013  . OA (osteoarthritis) of knee 09/05/2011  . Knee pain, right 09/05/2011  . KNEE, ARTHRITIS, DEGEN./OSTEO 01/30/2009  . SHOULDER PAIN 01/16/2009  . KNEE PAIN 01/16/2009  . IMPINGEMENT SYNDROME 01/16/2009  . TENDINITIS, LEFT KNEE 01/16/2009    End of Session Activity Tolerance: Patient tolerated treatment well General Behavior During Therapy: WFL  for tasks assessed/performed OT Plan of Care OT Home Exercise Plan: Blue-green putty for increasing grip strength; handout on energy conservation for maximizing activity level during day Consulted and Agree with Plan of Care: Patient;Family member/caregiver Family Member Consulted: wife  GO   Bea Graff, [place], OTR/L [phone]  03/16/2013, 11:10 AM  Physician Documentation Your signature is required to indicate approval of the treatment plan as stated above.  Please sign and either send electronically or make a copy of this report for your files and return this physician signed original.  Please mark one 1.__approve of plan  2. ___approve of plan with the following conditions.   ______________________________                                                          _____________________ Physician Signature                                                                                                             Date

## 2013-03-16 NOTE — Evaluation (Signed)
Physical Therapy Discharge Summary  Patient Details  Name: Zachary Murillo MRN: [id number] Date of Birth: 1950-06-25  Today's Date: 03/16/2013 Time: 1105-1155 PT Time Calculation (min): 50 min Charges:  PPT: 7654-6503 Self Care: 1115-1155             Visit#: 21 of 24  Re-eval: 03/15/13 Assessment Diagnosis: CVA Next MD Visit: Dr. Luan Pulling (PCP, referring)  Aline Brochure (Ortho)  Subjective Symptoms/Limitations Symptoms: Pt reports that he knows he needs to get more motiviation.  He is watching TV most of his day.  Is limited to walking 15 minutes on TM and in community and is then tired.  Only does exercises when he has to.  Him and wife agree he is at [facility]. They are agreeable to D/C today.  How long can you walk comfortably?: 15 minutes Pain Assessment Currently in Pain?: No/denies  Sensation/Coordination/Flexibility/Functional Tests Coordination Gross Motor Movements are Fluid and Coordinated: Yes Fine Motor Movements are Fluid and Coordinated: Yes 9 Hole Peg Test: R: 26 seconds  Assessment RUE Strength Grip (lbs): 71 ((66)) Lateral Pinch: 25 lbs ((23)) 3 Point Pinch: 21 lbs ((15))  Exercise/Treatments Mobility/Balance  Ambulation/Gait Ambulation/Gait: Yes Ambulation/Gait Assistance: 7: Independent;5: Supervision Ambulation Distance (Feet): 115 Feet (34 seconds) Gait velocity: 3.50f/sec (was 1.69 ft/sec) Berg Balance Test Sit to Stand: Able to stand without using hands and stabilize independently Standing Unsupported: Able to stand safely 2 minutes Sitting with Back Unsupported but Feet Supported on Floor or Stool: Able to sit safely and securely 2 minutes Stand to Sit: Sits safely with minimal use of hands Transfers: Able to transfer safely, minor use of hands Standing Unsupported with Eyes Closed: Able to stand 10 seconds safely Standing Ubsupported with Feet Together: Able to place feet together independently and stand 1 minute safely From Standing, Reach Forward with  Outstretched Arm: Can reach confidently >25 cm (10") From Standing Position, Pick up Object from Floor: Able to pick up shoe safely and easily From Standing Position, Turn to Look Behind Over each Shoulder: Looks behind from both sides and weight shifts well Turn 360 Degrees: Able to turn 360 degrees safely in 4 seconds or less Standing Unsupported, Alternately Place Feet on Step/Stool: Able to stand independently and safely and complete 8 steps in 20 seconds Standing Unsupported, One Foot in Front: Able to plae foot ahead of the other independently and hold 30 seconds Standing on One Leg: Able to lift leg independently and hold 5-10 seconds Total Score: 54 Timed Up and Go Test TUG: Normal TUG Normal TUG (seconds): 7 (was 17 seconds w/SPC))     Physical Therapy Assessment and Plan PT Assessment and Plan Clinical Impression Statement: Mr. JMayahas attended 21 visits over the past 3 months s/p stroke with following findings: pt has met all goals, has been educated on a proper maintance program.  Provided education to pt and wife about pediometers, and provided with 2 week free memebership to [employer]  Pt is willing to try these locations to continue to improve his health.  [facility]  Pt will f/u with MD to discuss pain with her shoulder.  PT Plan: D/C    Goals Home Exercise Program Pt/caregiver will Perform Home Exercise Program: Independently;For increased strengthening;For improved balance PT Goal: Perform Home Exercise Program - Progress: Met PT Short Term Goals Time to Complete Short Term Goals: 3 weeks PT Short Term Goal 1: Pt will improve his prorioceptive awareness and begin ambulating with  a single point cane.   PT Short Term Goal 1 - Progress: Met PT Short Term Goal 2: Pt will improve his gait velocity to 3.07 ft/sec with a SPC for normalized age range.  PT Short Term Goal 2 - Progress: Met PT Long Term Goals Time to Complete Long Term  Goals:  (6 weeks) PT Long Term Goal 1: Pt will improve his activity tolerance in order to ambulate x15 minutes independently in an indoor and outdoor environment to continue with a walking program for weight loss.  (15 minutes on her TM) PT Long Term Goal 1 - Progress: Met PT Long Term Goal 2: Pt will improve his Berg Balance Score to 50/56 to decrease risk of falls.  Long Term Goal 3: Pt will improve his TUG time to less than 13 seconds walking independently in order to safely ambulate in the community.  Long Term Goal 4: Pt will improve his functional strength and activity tolerance 5 times in 8.4 seconds for normalized age range.   Problem List Patient Active Problem List   Diagnosis Date Noted  . Difficulty in walking(719.7) 01/25/2013  . CVA (cerebral infarction) 01/22/2013  . Lack of coordination 01/22/2013  . OA (osteoarthritis) of knee 09/05/2011  . Knee pain, right 09/05/2011  . KNEE, ARTHRITIS, DEGEN./OSTEO 01/30/2009  . SHOULDER PAIN 01/16/2009  . KNEE PAIN 01/16/2009  . IMPINGEMENT SYNDROME 01/16/2009  . TENDINITIS, LEFT KNEE 01/16/2009    PT - End of Session Equipment Utilized During Treatment: Gait belt Activity Tolerance: Patient limited by fatigue;Patient tolerated treatment well General Behavior During Therapy: [place] for tasks assessed/performed PT Plan of Care PT Patient Instructions: discussed motiviation to exercises, pedimeters, maintaince programs.  Consulted and Agree with Plan of Care: Patient;Family member/caregiver Family Member Consulted: Wife Otila Kluver)  [place], MPT, ATC 03/16/2013, 12:21 PM  Physician Documentation Your signature is required to indicate approval of the treatment plan as stated above.  Please sign and either send electronically or make a copy of this report for your files and return this physician signed original.   Please mark one 1.__approve of plan  2. ___approve of plan with the following  conditions.   ______________________________                                                          _____________________ Physician Signature                                                                                                             Date

## 2013-03-17 ENCOUNTER — Inpatient Hospital Stay (HOSPITAL_COMMUNITY): Admission: RE | Admit: 2013-03-17 | Payer: BC Managed Care – PPO | Source: Ambulatory Visit | Admitting: Specialist

## 2013-03-19 ENCOUNTER — Ambulatory Visit (HOSPITAL_COMMUNITY): Payer: BC Managed Care – PPO | Admitting: Specialist

## 2013-03-22 ENCOUNTER — Ambulatory Visit (HOSPITAL_COMMUNITY): Payer: BC Managed Care – PPO | Admitting: Occupational Therapy

## 2013-03-23 DIAGNOSIS — I272 Pulmonary hypertension, unspecified: Secondary | ICD-10-CM | POA: Insufficient documentation

## 2013-03-24 ENCOUNTER — Ambulatory Visit (HOSPITAL_COMMUNITY): Payer: BC Managed Care – PPO | Admitting: Occupational Therapy

## 2013-03-26 ENCOUNTER — Ambulatory Visit (HOSPITAL_COMMUNITY): Payer: BC Managed Care – PPO | Admitting: Occupational Therapy

## 2013-03-29 ENCOUNTER — Ambulatory Visit (HOSPITAL_COMMUNITY): Payer: BC Managed Care – PPO | Admitting: Occupational Therapy

## 2013-03-31 ENCOUNTER — Ambulatory Visit (HOSPITAL_COMMUNITY): Payer: BC Managed Care – PPO | Admitting: Specialist

## 2013-04-02 ENCOUNTER — Ambulatory Visit (HOSPITAL_COMMUNITY): Payer: BC Managed Care – PPO | Admitting: Occupational Therapy

## 2013-04-05 ENCOUNTER — Ambulatory Visit (HOSPITAL_COMMUNITY): Payer: BC Managed Care – PPO | Admitting: Occupational Therapy

## 2013-04-07 ENCOUNTER — Ambulatory Visit (INDEPENDENT_AMBULATORY_CARE_PROVIDER_SITE_OTHER): Payer: BC Managed Care – PPO | Admitting: Orthopedic Surgery

## 2013-04-07 ENCOUNTER — Ambulatory Visit (HOSPITAL_COMMUNITY): Payer: BC Managed Care – PPO | Admitting: Occupational Therapy

## 2013-04-07 ENCOUNTER — Ambulatory Visit (INDEPENDENT_AMBULATORY_CARE_PROVIDER_SITE_OTHER): Payer: BC Managed Care – PPO

## 2013-04-07 VITALS — BP 189/96 | Ht 70.0 in | Wt 301.0 lb

## 2013-04-07 DIAGNOSIS — M549 Dorsalgia, unspecified: Secondary | ICD-10-CM

## 2013-04-07 DIAGNOSIS — M51379 Other intervertebral disc degeneration, lumbosacral region without mention of lumbar back pain or lower extremity pain: Secondary | ICD-10-CM

## 2013-04-07 DIAGNOSIS — M5137 Other intervertebral disc degeneration, lumbosacral region: Secondary | ICD-10-CM

## 2013-04-07 NOTE — Progress Notes (Signed)
Patient ID: Zachary Shadow., male   DOB: 19-Sep-1950, 63 y.o.   MRN: [id number] Chief Complaint  Patient presents with  . Back Pain    Lower back pain, no injury   BP 189/96  Ht 5\' 10"  (1.778 m)  Wt 301 lb (136.533 kg)  BMI 43.19 kg/m2  Established patient new problem  This is a 63 year old male recently had a stroke has some mild weakness in the right upper and lower extremity, as a chronic rotator cuff syndrome possible rotator cuff tear presents with chronic pain in his lower back he is here for a reevaluation. He has occasional episodes where the back goes out this is relieved by postural reduction in the supine position. When his back is out he does have some radicular symptoms. Currently denies numbness or tingling. His gait is altered primarily by his residual stroke symptoms  Review of symptoms fatigue snoring unsteady gait excessive thirst  Past Medical History  Diagnosis Date  . Diabetes mellitus   . Sleep apnea   . Overweight   . Hypertension   . Stroke     Past Surgical History  Procedure Laterality Date  . Knee arthroscopy    . Nasal septum surgery    . Cardiac catheterization      Physical Exam(12)  Vital signs: BP 189/96  Ht 5\' 10"  (1.778 m)  Wt 301 lb (136.533 kg)  BMI 43.19 kg/m2   1.GENERAL: normal development well-groomed with moderate obesity  2. CDV: Mild bilateral peripheral edema  3. Skin: Mild bilateral rubor in the pretibial region  4. Lymph: nodes were not palpable/normal  5/6. Psychiatric: awake, alert and oriented, mood and affect normal   7. Neuro: normal sensation in the lower extremities  8.   MSK  Gait: Slightly altered gait He has no tenderness in his back but his posture is abnormal. He has flat back posture. He has no restriction in range of motion at the hip joint in either leg he has arthritis in his knees with medial joint line pain bilaterally and some loss of motion no swelling in the knees today motor exam normal distally  bilaterally. No tension signs today  Imaging radiographs show severe degenerative disc disease  Assessment:  Encounter Diagnoses  Name Primary?  . Back pain Yes  . DDD (degenerative disc disease), lumbosacral        Plan: Recommend 6 month followup no treatment needed as he is currently not symptomatic.

## 2013-04-09 ENCOUNTER — Ambulatory Visit (HOSPITAL_COMMUNITY): Payer: BC Managed Care – PPO | Admitting: Occupational Therapy

## 2013-05-10 ENCOUNTER — Other Ambulatory Visit (HOSPITAL_COMMUNITY): Payer: Self-pay | Admitting: Pulmonary Disease

## 2013-05-10 DIAGNOSIS — M545 Low back pain, unspecified: Secondary | ICD-10-CM

## 2013-05-11 ENCOUNTER — Ambulatory Visit (HOSPITAL_COMMUNITY)
Admission: RE | Admit: 2013-05-11 | Discharge: 2013-05-11 | Disposition: A | Discharge status: Home / Self Care | Payer: BC Managed Care – PPO | Source: Ambulatory Visit | Attending: Pulmonary Disease | Admitting: Pulmonary Disease

## 2013-05-11 DIAGNOSIS — Q762 Congenital spondylolisthesis: Secondary | ICD-10-CM | POA: Insufficient documentation

## 2013-05-11 DIAGNOSIS — M5137 Other intervertebral disc degeneration, lumbosacral region: Secondary | ICD-10-CM | POA: Insufficient documentation

## 2013-05-11 DIAGNOSIS — M545 Low back pain, unspecified: Secondary | ICD-10-CM

## 2013-05-11 DIAGNOSIS — M5126 Other intervertebral disc displacement, lumbar region: Secondary | ICD-10-CM | POA: Insufficient documentation

## 2013-05-11 DIAGNOSIS — M47817 Spondylosis without myelopathy or radiculopathy, lumbosacral region: Secondary | ICD-10-CM | POA: Insufficient documentation

## 2013-05-11 DIAGNOSIS — M51379 Other intervertebral disc degeneration, lumbosacral region without mention of lumbar back pain or lower extremity pain: Secondary | ICD-10-CM | POA: Insufficient documentation

## 2013-05-11 DIAGNOSIS — G8929 Other chronic pain: Secondary | ICD-10-CM | POA: Insufficient documentation

## 2013-05-12 ENCOUNTER — Ambulatory Visit (HOSPITAL_COMMUNITY): Payer: BC Managed Care – PPO

## 2013-06-21 DIAGNOSIS — I428 Other cardiomyopathies: Secondary | ICD-10-CM | POA: Insufficient documentation

## 2013-07-02 DIAGNOSIS — Z981 Arthrodesis status: Secondary | ICD-10-CM

## 2013-07-02 DIAGNOSIS — M48061 Spinal stenosis, lumbar region without neurogenic claudication: Secondary | ICD-10-CM | POA: Insufficient documentation

## 2013-07-02 HISTORY — DX: Arthrodesis status: Z98.1

## 2013-08-04 ENCOUNTER — Ambulatory Visit (HOSPITAL_COMMUNITY)
Admission: RE | Admit: 2013-08-04 | Discharge: 2013-08-04 | Disposition: A | Discharge status: Home / Self Care | Payer: BC Managed Care – PPO | Source: Ambulatory Visit | Attending: Pulmonary Disease | Admitting: Pulmonary Disease

## 2013-08-04 DIAGNOSIS — M545 Low back pain, unspecified: Secondary | ICD-10-CM | POA: Insufficient documentation

## 2013-08-04 DIAGNOSIS — E119 Type 2 diabetes mellitus without complications: Secondary | ICD-10-CM | POA: Insufficient documentation

## 2013-08-04 DIAGNOSIS — R269 Unspecified abnormalities of gait and mobility: Secondary | ICD-10-CM | POA: Insufficient documentation

## 2013-08-04 DIAGNOSIS — IMO0001 Reserved for inherently not codable concepts without codable children: Secondary | ICD-10-CM | POA: Insufficient documentation

## 2013-08-04 DIAGNOSIS — R209 Unspecified disturbances of skin sensation: Secondary | ICD-10-CM | POA: Insufficient documentation

## 2013-08-04 DIAGNOSIS — M5380 Other specified dorsopathies, site unspecified: Secondary | ICD-10-CM | POA: Insufficient documentation

## 2013-08-04 DIAGNOSIS — R262 Difficulty in walking, not elsewhere classified: Secondary | ICD-10-CM | POA: Insufficient documentation

## 2013-08-04 NOTE — Evaluation (Signed)
Physical Therapy Evaluation  Patient Details  Name: Zachary Murillo. MRN: [id number] Date of Birth: 1951/03/01  Today's Date: 08/04/2013 Time: 0623-7628 PT Time Calculation (min): 45 min    Charges: 1 Bard Herbert [id number]          Visit#: 1 of 25  Re-eval: 09/03/13 Assessment Diagnosis: Lumbar spine stiffness following L4-S1 spine fusion.  Surgical Date: 06/28/13 Next MD Visit: Anastasia Fiedler 3 weeks, Hawkings 2-3 weeks.  Prior Therapy: yes, helpful.   Authorization: [employer]    Authorization Visit#: 1 of 25   Past Medical History:  Past Medical History  Diagnosis Date  . Diabetes mellitus   . Sleep apnea   . Overweight   . Hypertension   . Stroke    Past Surgical History:  Past Surgical History  Procedure Laterality Date  . Knee arthroscopy    . Nasal septum surgery    . Cardiac catheterization      Subjective Symptoms/Limitations Symptoms: patient has no pain, no Numbness and tingling, no pronouinced LE weakness.  Pertinent History: Patient had surgery 06/28/13, patient had pinched sciatic nerve and pain in lt LE, patient has diabetes. History of bilateral TKA.  How long can you stand comfortably?: 8minutes How long can you walk comfortably?: Walkier >321ft, < 34ft Pain Assessment Currently in Pain?: No/denies  Precautions/Restrictions  Restrictions Other Position/Activity Restrictions: no bending, lifting, twisting.   Sensation/Coordination/Flexibility/Functional Tests Sensation Light Touch: Appears Intact Flexibility Thomas: Positive Obers: Positive 90/90: Positive  Assessment RLE AROM (degrees) Right Ankle Dorsiflexion: 0 RLE Strength Right Hip Flexion: 4/5 Right Hip External Rotation : 40 Right Hip Internal Rotation : 5 Right Knee Flexion: 4/5 Right Knee Extension: 5/5 Right Ankle Dorsiflexion: 4/5  Exercise/Treatments Standing Other Standing Lumbar Exercises: Gastroc stretch 10x 3seconds at wall Seated Bridges 10x  Physical Therapy Assessment  and Plan PT Assessment and Plan Clinical Impression Statement: Patient seen s/p Lumbar spine fusion of L4,L5, S1 that has resulted in decreased low back and LE pain, numbness and tingling an weakness. Patient displays dificlty walking secondary to hip and knee stiffness, and LE and trunk weakness. Patient displasy significant hamstring, gastroc, quad raceps and hip flexor mobility limitations and significant glut med/max weakness. Allsthese limitations will be addressed to help patient meet long term goals and return to prior level of function.  Pt will benefit from skilled therapeutic intervention in order to improve on the following deficits: Abnormal gait;Decreased balance;Decreased coordination;Decreased strength;Impaired flexibility;Difficulty walking;Increased muscle spasms;Decreased range of motion;Decreased mobility Rehab Potential: Good PT Frequency: Min 2X/week PT Duration: 12 weeks PT Plan: Initialize LE stretching program in standing next session for calfs, hip flexor, groin, hamstring, quads, and gluts with introduction to  low level glut strengthening exercises    Goals PT Short Term Goals Time to Complete Short Term Goals: 4 weeks PT Short Term Goal 1: Increase ankle dorsiflexion to 15 degrees to decrease early heel rise durign gait PT Short Term Goal 2: Increase hip extension to 15degrees to increase stride length during gait PT Short Term Goal 3: Increase hamstring mobility to a negative 90/90 hamstring test so patient can bend forward to touch toes without bending back.  PT Short Term Goal 4: Increase hip internal rotation to 35 degrees to improve shock absorption at foot strike durign gait and decrease toe out durin gait.  PT Long Term Goals Time to Complete Long Term Goals: 12 weeks PT Long Term Goal 1: Increase glut max strength to 4/5 so patient can perform sit to stand  5  times in less than 15 seconds withotu UE support PT Long Term Goal 2: Increase glut med strength to 4/5  so patient can ambulate ambulate withotu trendelenburg gait Long Term Goal 3: Increase glut max/med strength to 5/5 so patient can ambulate up and down stairs without UE support and withtou an abnormal gait pattern.   Problem List Patient Active Problem List   Diagnosis Date Noted  . DDD (degenerative disc disease), 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/2013  . Difficulty in walking(719.7) 01/25/2013  . CVA (cerebral infarction) 01/22/2013  . Lack of coordination 01/22/2013  . OA (osteoarthritis) of knee 09/05/2011  . Knee pain, right 09/05/2011  . KNEE, ARTHRITIS, DEGEN./OSTEO 01/30/2009  . SHOULDER PAIN 01/16/2009  . KNEE PAIN 01/16/2009  . IMPINGEMENT SYNDROME 01/16/2009  . TENDINITIS, LEFT KNEE 01/16/2009    PT - End of Session Activity Tolerance: Patient tolerated treatment well General Behavior During Therapy: WFL for tasks assessed/performed PT Plan of Care PT Home Exercise Plan: to be given next session; patient instructed to contineu exercises recieved from hospital.   GP    Kastiel Simonian R Caysie Minnifield 08/04/2013, 7:33 PM  Physician Documentation Your signature is required to indicate approval of the treatment plan as stated above.  Please sign and either send electronically or make a copy of this report for your files and return this physician signed original.   Please mark one 1.__approve of plan  2. ___approve of plan with the following conditions.   ______________________________                                                          _____________________ Physician Signature                                                                                                             Date

## 2013-08-10 ENCOUNTER — Ambulatory Visit (HOSPITAL_COMMUNITY)
Admission: RE | Admit: 2013-08-10 | Discharge: 2013-08-10 | Disposition: A | Discharge status: Home / Self Care | Payer: BC Managed Care – PPO | Source: Ambulatory Visit | Attending: Pulmonary Disease | Admitting: Pulmonary Disease

## 2013-08-10 DIAGNOSIS — R209 Unspecified disturbances of skin sensation: Secondary | ICD-10-CM | POA: Insufficient documentation

## 2013-08-10 DIAGNOSIS — E119 Type 2 diabetes mellitus without complications: Secondary | ICD-10-CM | POA: Insufficient documentation

## 2013-08-10 DIAGNOSIS — M5380 Other specified dorsopathies, site unspecified: Secondary | ICD-10-CM | POA: Insufficient documentation

## 2013-08-10 DIAGNOSIS — IMO0001 Reserved for inherently not codable concepts without codable children: Secondary | ICD-10-CM | POA: Insufficient documentation

## 2013-08-10 DIAGNOSIS — R269 Unspecified abnormalities of gait and mobility: Secondary | ICD-10-CM | POA: Insufficient documentation

## 2013-08-10 DIAGNOSIS — M545 Low back pain, unspecified: Secondary | ICD-10-CM | POA: Insufficient documentation

## 2013-08-10 DIAGNOSIS — R262 Difficulty in walking, not elsewhere classified: Secondary | ICD-10-CM | POA: Insufficient documentation

## 2013-08-10 NOTE — Progress Notes (Signed)
Physical Therapy Treatment Patient Details  Name: Zachary Murillo. 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  Surgical Date: 06/28/13 Next MD Visit: Anastasia Fiedler 3 weeks, Hawkings 2-3 weeks.  Prior Therapy: yes, helpful.   Authorization: BCBS  Authorization Time Period:    Authorization Visit#: 2 of 25   Subjective: Symptoms/Limitations Symptoms: No pai, no numbness or tingling. Pain Assessment Currently in Pain?: No/denies  Precautions/Restrictions  Precautions Precautions: Back Restrictions Other Position/Activity Restrictions: no bending, lifting, twisting.   Exercise/Treatments Stretches Active Hamstring Stretch: 3 reps;30 seconds;Limitations Active Hamstring Stretch Limitations: standing 3 way directions on 14 in box Hip Flexor Stretch: 2 reps;30 seconds;Limitations Hip Flexor Stretch Limitations: on 14 in box Piriformis Stretch: 2 reps;20 seconds Standing Functional Squats: 10 reps Other Standing Lumbar Exercises: Gastoc st slant board 3x30" Other Standing Lumbar Exercises: weight shifting 10x Supine Clam: 5 reps;5 seconds Bent Knee Raise: 10 reps;5 seconds Bridge: 10 reps    Physical Therapy Assessment and Plan PT Assessment and Plan Clinical Impression Statement: Began PT POC introducing stretches to increase flexibility and improve mobility, noted tightness all LE muscualture.  Pt given HEP to add stretches at home.  Began core strengthening with cueing to improve stabilty for maximum benefit.  Pt fatgiued at end of session, no report of pain through session.   PT Plan: F/U on compliance with HEP stretches given today.  Continue with stretches to improve flexibiltiy/mobility.  Progress gluteal and core strengthening exercises.      Goals PT Short Term  Goals Time to Complete Short Term Goals: 4 weeks PT Short Term Goal 1: Increase ankle dorsiflexion to 15 degrees to decrease early heel rise durign gait PT Short Term Goal 1 - Progress: Progressing toward goal PT Short Term Goal 2: Increase hip extension to 15degrees to increase stride length during gait PT Short Term Goal 2 - Progress: Progressing toward goal PT Short Term Goal 3: Increase hamstring mobility to a negative 90/90 hamstring test so patient can bend forward to touch toes without bending back.  PT Short Term Goal 3 - Progress: Progressing toward goal PT Short Term Goal 4: Increase hip internal rotation to 35 degrees to improve shock absorption at foot strike durign gait and decrease toe out durin gait.  PT Short Term Goal 4 - Progress: Progressing toward goal PT Long Term Goals Time to Complete Long Term Goals: 12 weeks PT Long Term Goal 1: Increase glut max strength to 4/5 so patient can perform sit to stand  5 times in less than 15 seconds withotu UE support PT Long Term Goal 2: Increase glut med strength to 4/5 so patient can ambulate ambulate withotu trendelenburg gait Long Term Goal 3: Increase glut max/med strength to 5/5 so patient can ambulate up and down stairs without UE support and withtou an abnormal gait pattern.   Problem List Patient Active Problem List   Diagnosis Date Noted  . DDD (degenerative disc disease), 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/2013  . Difficulty in walking(719.7) 01/25/2013  . CVA (cerebral infarction) 01/22/2013  . Lack of coordination 01/22/2013  . OA (osteoarthritis) of knee 09/05/2011  . Knee pain, right 09/05/2011  . KNEE, ARTHRITIS, DEGEN./OSTEO 01/30/2009  . SHOULDER PAIN 01/16/2009  . KNEE PAIN 01/16/2009  . IMPINGEMENT SYNDROME 01/16/2009  . TENDINITIS, LEFT KNEE 01/16/2009    PT - End  of Session Activity Tolerance: Patient tolerated treatment well General Behavior During Therapy: [facility] for tasks assessed/performed  GP    Aldona Lento 08/10/2013, 4:05 PM

## 2013-08-12 ENCOUNTER — Ambulatory Visit (HOSPITAL_COMMUNITY)
Admission: RE | Admit: 2013-08-12 | Discharge: 2013-08-12 | Disposition: A | Discharge status: Home / Self Care | Payer: BC Managed Care – PPO | Source: Ambulatory Visit | Attending: Physical Therapy | Admitting: Physical Therapy

## 2013-08-12 NOTE — Progress Notes (Signed)
Physical Therapy Treatment Patient Details  Name: Zachary Murillo. 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  Surgical Date: 06/28/13 Next MD Visit: Anastasia Fiedler 3 weeks, Hawkings 2-3 weeks.  Prior Therapy: yes, helpful.   Authorization: BCBS  Authorization Visit#: 3 of 25   Subjective: Symptoms/Limitations Symptoms: No pain this session just soreness in bilateral LEs calfs and thighs  Precautions/Restrictions  Restrictions Other Position/Activity Restrictions: no bending, lifting, twisting.   Exercise/Treatments Stretches Active Hamstring Stretch: 3 reps;30 seconds;Limitations Active Hamstring Stretch Limitations: standing 3 way directions on 14 in box Hip Flexor Stretch: 2 reps;30 seconds;Limitations Hip Flexor Stretch Limitations: on 14 in box Piriformis Stretch: 2 reps;20 seconds Aerobic Stationary Bike: Nustep 59min lvl 3 Standing Functional Squats: 10 reps Other Standing Lumbar Exercises: Gastoc st slant board 3ways 2x20" Supine Clam: 10 reps;2 seconds (2 sets, second set eith green Tband) Bent Knee Raise: 10 reps;5 seconds;Limitations Bent Knee Raise Limitations: unilateral and bilateral, Bent knees raised to unilateral bent leg lowering 10x each Bridge: 10 reps   Physical Therapy Assessment and Plan PT Assessment and Plan Clinical Impression Statement: Patient responded well this session to progressed core strengthengin exercises with no pain though patient demosntrated moderate difficulty with performance of bilateral bent leg raise. Patient had increased soreness following last session and noted decreased soreness following stretched PT Plan: F/U on compliance with HEP stretches given today.  Continue with stretches to improve flexibiltiy/mobility.   Progress gluteal and core strengthening exercises.      Goals PT Short Term Goals PT Short Term Goal 1: Increase ankle dorsiflexion to 15 degrees to decrease early heel rise durign gait PT Short Term Goal 1 - Progress: Progressing toward goal PT Short Term Goal 2: Increase hip extension to 15degrees to increase stride length during gait PT Short Term Goal 2 - Progress: Progressing toward goal PT Short Term Goal 3: Increase hamstring mobility to a negative 90/90 hamstring test so patient can bend forward to touch toes without bending back.  PT Short Term Goal 3 - Progress: Progressing toward goal PT Short Term Goal 4: Increase hip internal rotation to 35 degrees to improve shock absorption at foot strike durign gait and decrease toe out durin gait.  PT Short Term Goal 4 - Progress: Progressing toward goal PT Long Term Goals PT Long Term Goal 1: Increase glut max strength to 4/5 so patient can perform sit to stand  5 times in less than 15 seconds withotu UE support PT Long Term Goal 1 - Progress: Progressing toward goal PT Long Term Goal 2: Increase glut med strength to 4/5 so patient can ambulate ambulate withotu trendelenburg gait PT Long Term Goal 2 - Progress: Progressing toward goal Long Term Goal 3: Increase glut max/med strength to 5/5 so patient can ambulate up and down stairs without UE support and withtou an abnormal gait pattern.  Long Term Goal 3 Progress: Progressing toward goal  Problem List Patient Active Problem List   Diagnosis Date Noted  . DDD (degenerative disc disease), 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/2013  . Difficulty in walking(719.7) 01/25/2013  . CVA (cerebral infarction) 01/22/2013  . Lack of coordination 01/22/2013  . OA (osteoarthritis) of knee 09/05/2011  . Knee pain, right 09/05/2011  . KNEE, ARTHRITIS, DEGEN./OSTEO 01/30/2009  . SHOULDER PAIN 01/16/2009  . KNEE PAIN 01/16/2009  . IMPINGEMENT  SYNDROME 01/16/2009  . TENDINITIS, LEFT KNEE 01/16/2009    PT - End of  Session Activity Tolerance: Patient tolerated treatment well General Behavior During Therapy: [facility] for tasks assessed/performed  GP    Constantino Starace R Atira Borello 08/12/2013, 2:54 PM

## 2013-08-17 ENCOUNTER — Ambulatory Visit (HOSPITAL_COMMUNITY)
Admission: RE | Admit: 2013-08-17 | Discharge: 2013-08-17 | Disposition: A | Discharge status: Home / Self Care | Payer: BC Managed Care – PPO | Source: Ambulatory Visit | Attending: Pulmonary Disease | Admitting: Pulmonary Disease

## 2013-08-17 NOTE — Progress Notes (Signed)
Physical Therapy Treatment Patient Details  Name: Zachary Murillo. 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  Surgical Date: 06/28/13 Next MD Visit: Anastasia Fiedler 3 weeks, Hawkings 2-3 weeks.  Prior Therapy: yes, helpful.   Authorization: BCBS  Authorization Time Period:    Authorization Visit#: 4 of 25   Subjective: Symptoms/Limitations Symptoms: Pain free, compliant with HEP without question. Pain Assessment Currently in Pain?: No/denies  Precautions/Restrictions  Precautions Precautions: Back Restrictions Other Position/Activity Restrictions: no bending, lifting, twisting.   Exercise/Treatments Stretches Active Hamstring Stretch: 3 reps;30 seconds;Limitations Active Hamstring Stretch Limitations: standing 3 way directions on 14 in box Hip Flexor Stretch: 2 reps;30 seconds;Limitations Hip Flexor Stretch Limitations: on 14 in box Quad Stretch: 3 reps;30 seconds Piriformis Stretch: 3 reps;30 seconds Aerobic Stationary Bike: Nustep- unavailable during session, completed after session no charge Standing Heel Raises: 20 reps;Limitations Heel Raises Limitations: Toe raises Functional Squats: 15 reps Other Standing Lumbar Exercises: Gastoc st slant board 3ways 3x30" Other Standing Lumbar Exercises: sidestepping and retro gait 2RT green tband Supine Clam: 10 reps;5 seconds;Limitations Clam Limitations: 10x 5" with green tband Bent Knee Raise: 10 reps;5 seconds;Limitations Bent Knee Raise Limitations: unilateral and bilateral, Bent knees raised to unilateral bent leg lowering 10x each Bridge: 15 reps;5 seconds Prone  Straight Leg Raise: 10 reps     Physical Therapy Assessment and Plan PT Assessment and Plan Clinical Impression Statement: Progressed gluteal  strengthening exercises with standing and prone exercises, able to add green theraband with sidestepping and retro gait with min difficulty.  Min cueing required for stabiltiy with supine core exercises for maximum musculature strengthening benefits.  Continued with stretches for gastroc, hip flexion, hamstrings and added quad stretch to POC. PT Plan: F/U on compliance with HEP stretches given today.  Continue with stretches to improve flexibiltiy/mobility.  Progress gluteal and core strengthening exercises.      Goals PT Short Term Goals PT Short Term Goal 1: Increase ankle dorsiflexion to 15 degrees to decrease early heel rise durign gait PT Short Term Goal 1 - Progress: Progressing toward goal PT Short Term Goal 2: Increase hip extension to 15degrees to increase stride length during gait PT Short Term Goal 2 - Progress: Progressing toward goal PT Short Term Goal 3: Increase hamstring mobility to a negative 90/90 hamstring test so patient can bend forward to touch toes without bending back.  PT Short Term Goal 3 - Progress: Progressing toward goal PT Short Term Goal 4: Increase hip internal rotation to 35 degrees to improve shock absorption at foot strike durign gait and decrease toe out durin gait.  PT Short Term Goal 4 - Progress: Progressing toward goal PT Long Term Goals PT Long Term Goal 1: Increase glut max strength to 4/5 so patient can perform sit to stand  5 times in less than 15 seconds withotu UE support PT Long Term Goal 1 - Progress: Progressing toward goal PT Long Term Goal 2: Increase glut med strength to 4/5 so patient can ambulate ambulate withotu trendelenburg gait PT Long Term Goal 2 - Progress: Progressing toward goal Long Term Goal 3: Increase glut max/med strength to 5/5 so patient can ambulate up and down stairs without UE support and withtou an abnormal gait pattern.   Problem List Patient Active Problem List   Diagnosis Date Noted  .  DDD (degenerative disc disease),  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/2013  . Difficulty in walking(719.7) 01/25/2013  . CVA (cerebral infarction) 01/22/2013  . Lack of coordination 01/22/2013  . OA (osteoarthritis) of knee 09/05/2011  . Knee pain, right 09/05/2011  . KNEE, ARTHRITIS, DEGEN./OSTEO 01/30/2009  . SHOULDER PAIN 01/16/2009  . KNEE PAIN 01/16/2009  . IMPINGEMENT SYNDROME 01/16/2009  . TENDINITIS, LEFT KNEE 01/16/2009    PT - End of Session Activity Tolerance: Patient tolerated treatment well General Behavior During Therapy: [facility] for tasks assessed/performed  GP    Aldona Lento 08/17/2013, 2:05 PM

## 2013-08-19 ENCOUNTER — Telehealth: Payer: Self-pay | Admitting: Orthopedic Surgery

## 2013-08-19 ENCOUNTER — Ambulatory Visit (HOSPITAL_COMMUNITY)
Admission: RE | Admit: 2013-08-19 | Discharge: 2013-08-19 | Disposition: A | Discharge status: Home / Self Care | Payer: BC Managed Care – PPO | Source: Ambulatory Visit | Attending: Physical Therapy | Admitting: Physical Therapy

## 2013-08-19 NOTE — Telephone Encounter (Signed)
Forwarding to [employer]

## 2013-08-19 NOTE — Telephone Encounter (Signed)
Patient/wife called regarding question about his right shoulder, which is one of the problems for which he is scheduled for re-check in August (6 months shoulder, knee, back).  States that patient has recently had back surgery (fusion, L4,L5) by Dr. Maryellen Pile, at [facility].  The question patient has is:  Can he come back sooner than his 6 months re-check for shoulder/arm re-eval, --said he can not raise right arm full range, nor does he have strength there.  He is currently undergoing out-patient therapy at [facility] rehab, and that is when he became aware of this issue. Please advise.  Best call back ph# [phone]

## 2013-08-19 NOTE — Progress Notes (Signed)
Physical Therapy Treatment Patient Details  Name: Zachary Murillo. 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  Surgical Date: 06/28/13 Next MD Visit: Anastasia Fiedler 3 weeks, Hawkings 2-3 weeks.  Prior Therapy: yes, helpful.   Authorization: BCBS  Authorization Time Period:    Authorization Visit#: 5 of 25   Subjective: Symptoms/Limitations Symptoms: Patient recently fell while tring to pick up his new paper that slipped our of his hands then he tried to catch it and followed it to thr ground  Precautions/Restrictions  Precautions Precautions: Back Restrictions Other Position/Activity Restrictions: no bending, lifting, twisting.   Exercise/Treatments Stretches Active Hamstring Stretch Limitations: standing 8" box 10x 3seconds Hip Flexor Stretch Limitations: on 14 in box 10x 3seconds Quad Stretch: 3 reps;30 seconds Piriformis Stretch: 3 reps;30 seconds Standing Heel Raises: Limitations;10 reps Heel Raises Limitations: Heel toe raises 3 sets of 10 reps with feet rotated 3 ways Functional Squats: 15 reps Other Standing Lumbar Exercises: Lateral step ups onto 4" box 2x 5 each Leg  Physical Therapy Assessment and Plan PT Assessment and Plan Clinical Impression Statement: Patient displasyed increased fatigue this session secondary to beigning a home walking program today. Progressed gluteal strengthening exercises with standingexercises and progressed ankel strengtheing exercises to decrease toe drag during gait. Continued with stretches for gastroc, hip flexion, hamstrings and added quad stretch to continue progressing mobility. Patient's wife present for therapy and wanted patient's HEP in detail so she could help patient remeber exercises PT Plan:  Continue with stretches to improve flexibiltiy/mobility.  Progress gluteal and core strengthening exercises.      Problem List Patient Active Problem List   Diagnosis Date Noted  . DDD (degenerative disc disease), 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/2013  . Difficulty in walking(719.7) 01/25/2013  . CVA (cerebral infarction) 01/22/2013  . Lack of coordination 01/22/2013  . OA (osteoarthritis) of knee 09/05/2011  . Knee pain, right 09/05/2011  . KNEE, ARTHRITIS, DEGEN./OSTEO 01/30/2009  . SHOULDER PAIN 01/16/2009  . KNEE PAIN 01/16/2009  . IMPINGEMENT SYNDROME 01/16/2009  . TENDINITIS, LEFT KNEE 01/16/2009    PT - End of Session Activity Tolerance: Patient tolerated treatment well General Behavior During Therapy: [facility] for tasks assessed/performed PT Plan of Care PT Home Exercise Plan: LE stretches and glut/core strengtheing.   GP    Dorthy Hustead R 08/19/2013, 6:42 PM

## 2013-08-19 NOTE — Telephone Encounter (Signed)
Noted, need clarification.

## 2013-08-19 NOTE — Telephone Encounter (Signed)
I WANT TO TRY TO HANDLE THIS AS OUTPATIENT   HE HAS OA OF THE SHOULDER  HE HAD ROM DEFICIT AND WEAKNESS HERE LAST TIME  MRI SHOULDER IF POSSIBLE BEFORE LAST VISIT  MUST SAY RCT SURGERY PLANNED FAILED THERAPY INJECTIONS AND NASAIDS

## 2013-08-19 NOTE — Telephone Encounter (Signed)
Routing to Dr Harrison 

## 2013-08-23 NOTE — Telephone Encounter (Signed)
Spoke with patient's wife. She is asking his neurosurgeon if it is ok for him to have an MRI, and if surgery is needed, how Izan Miron does he recommend he wait to have it? She will call back 08/24/13 with answers.

## 2013-08-24 ENCOUNTER — Ambulatory Visit (HOSPITAL_COMMUNITY)
Admission: RE | Admit: 2013-08-24 | Discharge: 2013-08-24 | Disposition: A | Discharge status: Home / Self Care | Payer: BC Managed Care – PPO | Source: Ambulatory Visit | Attending: Pulmonary Disease | Admitting: Pulmonary Disease

## 2013-08-24 NOTE — Progress Notes (Signed)
Physical Therapy Treatment Patient Details  Name: Zachary Murillo. 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  Surgical Date: 06/28/13 Next MD Visit: Anastasia Fiedler next week Prior Therapy: yes, helpful.   Authorization: BCBS  Authorization Time Period:    Authorization Visit#: 6 of 24   Subjective: Symptoms/Limitations Symptoms: Pt stated he was stiff today, is scratched up from fall last Wednesday Pain Assessment Currently in Pain?: No/denies  Precautions/Restrictions  Precautions Precautions: Back Restrictions Other Position/Activity Restrictions: no bending, lifting, twisting.   Exercise/Treatments Stretches Active Hamstring Stretch: 3 reps;30 seconds;Limitations Active Hamstring Stretch Limitations: standing 8" box 10x 3seconds Hip Flexor Stretch Limitations: on 14 in box 10x 3seconds Piriformis Stretch: 3 reps;30 seconds Standing Heel Raises: 15 reps;Limitations Heel Raises Limitations: heel and toe raises Functional Squats: 15 reps;Limitations Functional Squats Limitations: 3D hip excursion Forward Lunge: 5 reps;Limitations Forward Lunge Limitations: with therapist faciliation for form Other Standing Lumbar Exercises: Lateral step ups onto 4" box 10x each Leg;  standing marching 5" then ER 5" holds10x each with minimal HHA Other Standing Lumbar Exercises: 3D hip excursion with PTA faciliation    Physical Therapy Assessment and Plan PT Assessment and Plan Clinical Impression Statement: Session focus on functional strengthening exercises to improve gluteal and overall LE strength.  Following report of fall last week began lunges on even ground trying to progress to half kneeling to assist with ground to standing exercises, unable to progress to halfkneeling this  session due to weakness.  Added standing march then ER exercise for core and gluteal strengthening.  Pt limited by fatigue, no reports of pain. PT Plan: Continue with stretches to improve flexibiltiy/mobility.  Progress gluteal and core strengthening exercises.      Goals PT Short Term Goals PT Short Term Goal 1: Increase ankle dorsiflexion to 15 degrees to decrease early heel rise durign gait PT Short Term Goal 2: Increase hip extension to 15degrees to increase stride length during gait PT Short Term Goal 2 - Progress: Progressing toward goal PT Short Term Goal 3: Increase hamstring mobility to a negative 90/90 hamstring test so patient can bend forward to touch toes without bending back.  PT Short Term Goal 3 - Progress: Progressing toward goal PT Short Term Goal 4: Increase hip internal rotation to 35 degrees to improve shock absorption at foot strike durign gait and decrease toe out durin gait.  PT Long Term Goals PT Long Term Goal 1: Increase glut max strength to 4/5 so patient can perform sit to stand  5 times in less than 15 seconds withotu UE support PT Long Term Goal 1 - Progress: Progressing toward goal PT Long Term Goal 2: Increase glut med strength to 4/5 so patient can ambulate ambulate withotu trendelenburg gait PT Long Term Goal 2 - Progress: Progressing toward goal Long Term Goal 3: Increase glut max/med strength to 5/5 so patient can ambulate up and down stairs without UE support and withtou an abnormal gait pattern.   Problem List Patient Active Problem List   Diagnosis Date Noted  . DDD (degenerative disc disease), 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/2013  . Difficulty in walking(719.7) 01/25/2013  . CVA (cerebral infarction) 01/22/2013  . Lack of coordination 01/22/2013  . OA (osteoarthritis) of knee 09/05/2011  . Knee pain, right 09/05/2011  . KNEE, ARTHRITIS, DEGEN./OSTEO 01/30/2009  . SHOULDER PAIN 01/16/2009  .  KNEE PAIN 01/16/2009  . IMPINGEMENT SYNDROME 01/16/2009  .  TENDINITIS, LEFT KNEE 01/16/2009    PT - End of Session Activity Tolerance: Patient tolerated treatment well;Patient limited by fatigue General Behavior During Therapy: [facility] for tasks assessed/performed  GP    Aldona Lento 08/24/2013, 5:01 PM

## 2013-08-25 ENCOUNTER — Other Ambulatory Visit: Payer: Self-pay | Admitting: *Deleted

## 2013-08-25 DIAGNOSIS — M75100 Unspecified rotator cuff tear or rupture of unspecified shoulder, not specified as traumatic: Secondary | ICD-10-CM

## 2013-08-26 ENCOUNTER — Ambulatory Visit (HOSPITAL_COMMUNITY): Payer: BC Managed Care – PPO | Admitting: Physical Therapy

## 2013-08-26 NOTE — Telephone Encounter (Signed)
MRI ordered

## 2013-08-26 NOTE — Telephone Encounter (Signed)
Will call patient when approved and scheduled.

## 2013-08-27 ENCOUNTER — Ambulatory Visit (HOSPITAL_COMMUNITY)
Admission: RE | Admit: 2013-08-27 | Discharge: 2013-08-27 | Disposition: A | Discharge status: Home / Self Care | Payer: BC Managed Care – PPO | Source: Ambulatory Visit | Attending: Pulmonary Disease | Admitting: Pulmonary Disease

## 2013-08-27 NOTE — Progress Notes (Signed)
Physical Therapy Treatment Patient Details  Name: Zachary Murillo. MRN: [id number] Date of Birth: 1950/04/15  Today's Date: 08/27/2013 Time: 4492-0100 PT Time Calculation (min): 41 min TE 7121-9758 Visit#: 7 of 24  Re-eval: 09/03/13    Authorization: BCBS    Subjective: Symptoms/Limitations Symptoms: Pt states he has not been having any pain today.    Exercise/Treatments Stretches Active Hamstring Stretch: 3 reps;30 seconds;Limitations Active Hamstring Stretch Limitations: Standing 14" box Hip Flexor Stretch: 3 reps;30 seconds Hip Flexor Stretch Limitations: Standing 14" box Quad Stretch: 3 reps;30 seconds Piriformis Stretch: 3 reps;30 seconds Standing Functional Squats: 15 reps Other Standing Lumbar Exercises: Lateral step ups on 4" x15 each Other Standing Lumbar Exercises: 3D Hip Excursion Supine Clam: 15 reps;5 seconds Clam Limitations: GTB Bridge: 20 reps (10x2)  Physical Therapy Assessment and Plan PT Assessment and Plan Clinical Impression Statement: Pt reports no complaints of pain, or residual stiffness from fall last week.  Pt amb into clinic with std cane, though does not use cane around the therapy clinic; noted decreased cadence and limited hip rotation with foot progression.  Focus on strengthening this session, with increased reps of activities to tolerance, particularly in glute med exercises.   Pt will benefit from skilled therapeutic intervention in order to improve on the following deficits: Abnormal gait;Improper body mechanics;Decreased activity tolerance;Difficulty walking Rehab Potential: Good PT Frequency: Min 2X/week PT Duration: 12 weeks    Problem List Patient Active Problem List   Diagnosis Date Noted  . DDD (degenerative disc disease), 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/2013  . Difficulty in walking(719.7) 01/25/2013  . CVA (cerebral infarction) 01/22/2013  . Lack of coordination 01/22/2013  . OA (osteoarthritis) of knee 09/05/2011  . Knee pain, right  09/05/2011  . KNEE, ARTHRITIS, DEGEN./OSTEO 01/30/2009  . SHOULDER PAIN 01/16/2009  . KNEE PAIN 01/16/2009  . IMPINGEMENT SYNDROME 01/16/2009  . TENDINITIS, LEFT KNEE 01/16/2009    PT - End of Session Activity Tolerance: Patient tolerated treatment well;Patient limited by fatigue General Behavior During Therapy: [facility] for tasks assessed/performed  WOODWORTH,STEPHANIE 08/27/2013, 1:45 PM

## 2013-08-31 ENCOUNTER — Ambulatory Visit (HOSPITAL_COMMUNITY): Payer: BC Managed Care – PPO | Admitting: Physical Therapy

## 2013-08-31 ENCOUNTER — Ambulatory Visit (HOSPITAL_COMMUNITY): Payer: BC Managed Care – PPO

## 2013-09-01 ENCOUNTER — Ambulatory Visit (HOSPITAL_COMMUNITY)
Admission: RE | Admit: 2013-09-01 | Discharge: 2013-09-01 | Disposition: A | Discharge status: Home / Self Care | Payer: BC Managed Care – PPO | Source: Ambulatory Visit | Attending: Pulmonary Disease | Admitting: Pulmonary Disease

## 2013-09-01 ENCOUNTER — Ambulatory Visit (HOSPITAL_COMMUNITY)
Admission: RE | Admit: 2013-09-01 | Discharge: 2013-09-01 | Disposition: A | Discharge status: Home / Self Care | Payer: BC Managed Care – PPO | Source: Ambulatory Visit | Attending: Orthopedic Surgery | Admitting: Orthopedic Surgery

## 2013-09-01 DIAGNOSIS — M898X9 Other specified disorders of bone, unspecified site: Secondary | ICD-10-CM | POA: Insufficient documentation

## 2013-09-01 DIAGNOSIS — M24019 Loose body in unspecified shoulder: Secondary | ICD-10-CM | POA: Insufficient documentation

## 2013-09-01 DIAGNOSIS — M719 Bursopathy, unspecified: Principal | ICD-10-CM | POA: Insufficient documentation

## 2013-09-01 DIAGNOSIS — M25419 Effusion, unspecified shoulder: Secondary | ICD-10-CM | POA: Insufficient documentation

## 2013-09-01 DIAGNOSIS — M67919 Unspecified disorder of synovium and tendon, unspecified shoulder: Secondary | ICD-10-CM | POA: Insufficient documentation

## 2013-09-01 DIAGNOSIS — R937 Abnormal findings on diagnostic imaging of other parts of musculoskeletal system: Secondary | ICD-10-CM | POA: Insufficient documentation

## 2013-09-01 DIAGNOSIS — M75100 Unspecified rotator cuff tear or rupture of unspecified shoulder, not specified as traumatic: Secondary | ICD-10-CM

## 2013-09-01 DIAGNOSIS — M19019 Primary osteoarthritis, unspecified shoulder: Secondary | ICD-10-CM | POA: Insufficient documentation

## 2013-09-01 NOTE — Evaluation (Addendum)
Physical Therapy Evaluation  Patient Details  Name: Zachary Murillo. MRN: [id number] Date of Birth: 06-04-1950  Today's Date: 09/01/2013 Time: [phone] PT Time Calculation (min): 45 min   Charges: TherEx [phone]           Visit#: 8 of 24  Re-eval: 10/01/13 Assessment Diagnosis: Lumbar spine stiffness following L4-S1 spine fusion.  Surgical Date: 06/28/13 Next MD Visit: Anastasia Fiedler next week Prior Therapy: yes, helpful.   Authorization: BCBS    Authorization Visit#: 8 of 24   Past Medical History:  Past Medical History  Diagnosis Date  . Diabetes mellitus   . Sleep apnea   . Overweight   . Hypertension   . Stroke    Past Surgical History:  Past Surgical History  Procedure Laterality Date  . Knee arthroscopy    . Nasal septum surgery    . Cardiac catheterization      Subjective Symptoms/Limitations Symptoms: Pt states he has not been having any pain today, though he notes increased stiffness secondary to decreased performance of his stretches over the last 24hours.   Precautions/Restrictions  Precautions Precautions: Back Restrictions Other Position/Activity Restrictions: no bending, lifting, twisting.   Sensation/Coordination/Flexibility/Functional Tests Flexibility Thomas: Positive Obers: Positive 90/90: Positive  Assessment RLE AROM (degrees) Right Hip Extension: 15 Right Ankle Dorsiflexion: 5 RLE Strength Right Hip Flexion: 5/5 Right Hip Extension: 3+/5 Right Hip External Rotation : 40 Right Hip Internal Rotation : 14 Right Knee Flexion:  (4+/5) Right Knee Extension: 5/5 Right Ankle Dorsiflexion:  (4+/5) Lumbar Strength Lumbar Flexion: 3+/5  Exercise/Treatments Nustep 43mnutes Lvl 5 Stretches Active Hamstring Stretch: 3 reps;30 seconds;Limitations Active Hamstring Stretch Limitations: Standing 14" box Hip Flexor Stretch: 3 reps;30 seconds Hip Flexor Stretch Limitations: Standing 14" box Quad Stretch: 3 reps;30 seconds Piriformis Stretch: 3  reps;30 seconds Supine Bent Knee Raise: 5 reps Bent Knee Raise Limitations: unilateral, bilateral, and bilateral raised with unilateral lower Straight Leg Raise: 5 reps;Limitations Straight Leg Raises Limitations: unilateral, bilateral raise to unilateral lowering.    Physical Therapy Assessment and Plan PT Assessment and Plan Clinical Impression Statement: Patient states no recent complaint of pain but notes continued soreness and stiffness. Patient is making slow but steady progress towards all short term and long term goals, but continues to display significant bilateral LE stiffness and abdominal muscle weakness resulting in compensatory patterns with LE raises and straining in Lt groin/hip flexors. Following stretches patient displays improved gait decreased wobbliness and improved stability. Patient was educated on importance of performance of HEP as his wife continues to note decreased adherence.  Patient will continue to benefit from skilled PT to reach long term goals of squattign to the ground and increase trunk stability decrease risk of falls.  PT Plan: Continue with stretches to improve flexibiltiy/mobility and use joint mobilizations as indicated to increase hip iternal rotation.  Progress gluteal and core strengthening exercises.    Patient will benefit from continued skilled physical therapy 2x a week for 8 more weeks.  Goals PT Short Term Goals PT Short Term Goal 1: Increase ankle dorsiflexion to 15 degrees to decrease early heel rise durign gait PT Short Term Goal 1 - Progress: Progressing toward goal PT Short Term Goal 2: Increase hip extension to 15degrees to increase stride length during gait PT Short Term Goal 2 - Progress: Met PT Short Term Goal 3: Increase hamstring mobility to a negative 90/90 hamstring test so patient can bend forward to touch toes without bending back.  PT Short Term Goal 3 -  Progress: Progressing toward goal PT Short Term Goal 4: Increase hip internal  rotation to 35 degrees to improve shock absorption at foot strike durign gait and decrease toe out durin gait.  PT Short Term Goal 4 - Progress: Progressing toward goal PT Long Term Goals PT Long Term Goal 1: Increase glut max strength to 4/5 so patient can perform sit to stand  5 times in less than 15 seconds withotu UE support PT Long Term Goal 1 - Progress: Progressing toward goal PT Long Term Goal 2: Increase glut med strength to 4/5 so patient can ambulate ambulate withotu trendelenburg gait PT Long Term Goal 2 - Progress: Progressing toward goal Long Term Goal 3: Increase glut max/med strength to 5/5 so patient can ambulate up and down stairs without UE support and withtou an abnormal gait pattern.  Long Term Goal 3 Progress: Progressing toward goal  Problem List Patient Active Problem List   Diagnosis Date Noted  . DDD (degenerative disc disease), 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/2013  . Difficulty in walking(719.7) 01/25/2013  . CVA (cerebral infarction) 01/22/2013  . Lack of coordination 01/22/2013  . OA (osteoarthritis) of knee 09/05/2011  . Knee pain, right 09/05/2011  . KNEE, ARTHRITIS, DEGEN./OSTEO 01/30/2009  . SHOULDER PAIN 01/16/2009  . KNEE PAIN 01/16/2009  . IMPINGEMENT SYNDROME 01/16/2009  . TENDINITIS, LEFT KNEE 01/16/2009       GP    DeWitt, Cash R 09/01/2013, 11:14 AM  Physician Documentation Your signature is required to indicate approval of the treatment plan as stated above.  Please sign and either send electronically or make a copy of this report for your files and return this physician signed original.   Please mark one 1.__approve of plan  2. ___approve of plan with the following conditions.   ______________________________                                                          _____________________ Physician Signature                                                                                                             Date

## 2013-09-02 ENCOUNTER — Ambulatory Visit (HOSPITAL_COMMUNITY): Payer: BC Managed Care – PPO | Admitting: Physical Therapy

## 2013-09-03 ENCOUNTER — Ambulatory Visit (HOSPITAL_COMMUNITY)
Admission: RE | Admit: 2013-09-03 | Discharge: 2013-09-03 | Disposition: A | Discharge status: Home / Self Care | Payer: BC Managed Care – PPO | Source: Ambulatory Visit | Attending: Pulmonary Disease | Admitting: Pulmonary Disease

## 2013-09-03 NOTE — Progress Notes (Signed)
Physical Therapy Treatment Patient Details  Name: Zachary Murillo. MRN: [id number] Date of Birth: 1951-02-22  Today's Date: 09/03/2013 Time: 0254-2706 PT Time Calculation (min): 45 min  Charges: TherEx [phone] Visit#: 9 of 24  Re-eval: 10/01/13 Assessment Diagnosis: Lumbar spine stiffness following L4-S1 spine fusion.  Surgical Date: 06/28/13 Next MD Visit: Anastasia Fiedler next week Prior Therapy: yes, helpful.   Authorization: BCBS  Authorization Time Period:    Authorization Visit#: 9 of 24   Subjective: Symptoms/Limitations Symptoms: Pt states he has not been having any pain today, and he feels good to be out of the brace and to be able to drive again.   Precautions/Restrictions  Precautions Precautions: Back Restrictions Other Position/Activity Restrictions: no bending, lifting, twisting.   Exercise/Treatments Stretches Active Hamstring Stretch: 3 reps;30 seconds;Limitations Active Hamstring Stretch Limitations: Standing 14" box Hip Flexor Stretch: 3 reps;30 seconds Hip Flexor Stretch Limitations: Standing 14" box Quad Stretch: 3 reps;30 seconds Piriformis Stretch: 3 reps;30 seconds Standing Functional Squats: 15 reps;Limitations Functional Squats Limitations: neutral, split stance 5x each  Other Standing Lumbar Exercises: Lateral step ups on 4" x15 each Other Standing Lumbar Exercises: 3D Hip Excursion Supine Bent Knee Raise: 5 reps Bent Knee Raise Limitations: unilateral, bilateral, and bilateral raised with unilateral lower Straight Leg Raise: 5 reps;Limitations Straight Leg Raises Limitations: unilateral, bilateral raise to unilateral lowering.  Quadruped Single Arm Raise: 10 reps Straight Leg Raise: 10 reps  Physical Therapy Assessment and Plan PT Assessment and Plan Clinical Impression Statement: Patient displays improving hip and trunk stregth but continues to have weakness in bilateral glutes and abdominals. Patient dipslays  a good response to all  exercises with improving strength but notes minor groin pain duiring quadraped alternatign leg lifts that improved following groin stretches. Following stretches patient displays improved gait decreased wobbliness and improved stability.  PT Plan: Continue with stretches to improve flexibiltiy/mobility and use joint mobilizations as indicated to increase hip iternal rotation.  Progress gluteal and core strengthening exercises.      Goals PT Short Term Goals PT Short Term Goal 1: Increase ankle dorsiflexion to 15 degrees to decrease early heel rise durign gait PT Short Term Goal 1 - Progress: Progressing toward goal PT Short Term Goal 2: Increase hip extension to 15degrees to increase stride length during gait PT Short Term Goal 2 - Progress: Met PT Short Term Goal 3: Increase hamstring mobility to a negative 90/90 hamstring test so patient can bend forward to touch toes without bending back.  PT Short Term Goal 3 - Progress: Progressing toward goal PT Short Term Goal 4: Increase hip internal rotation to 35 degrees to improve shock absorption at foot strike durign gait and decrease toe out durin gait.  PT Short Term Goal 4 - Progress: Progressing toward goal PT Long Term Goals PT Long Term Goal 1: Increase glut max strength to 4/5 so patient can perform sit to stand  5 times in less than 15 seconds withotu UE support PT Long Term Goal 1 - Progress: Progressing toward goal PT Long Term Goal 2: Increase glut med strength to 4/5 so patient can ambulate ambulate withotu trendelenburg gait PT Long Term Goal 2 - Progress: Progressing toward goal Long Term Goal 3: Increase glut max/med strength to 5/5 so patient can ambulate up and down stairs without UE support and withtou an abnormal gait pattern.  Long Term Goal 3 Progress: Progressing toward goal  Problem List Patient Active Problem List   Diagnosis Date Noted  . DDD (degenerative disc  disease), 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/2013  . Difficulty in  walking(719.7) 01/25/2013  . CVA (cerebral infarction) 01/22/2013  . Lack of coordination 01/22/2013  . OA (osteoarthritis) of knee 09/05/2011  . Knee pain, right 09/05/2011  . KNEE, ARTHRITIS, DEGEN./OSTEO 01/30/2009  . SHOULDER PAIN 01/16/2009  . KNEE PAIN 01/16/2009  . IMPINGEMENT SYNDROME 01/16/2009  . TENDINITIS, LEFT KNEE 01/16/2009    PT - End of Session Activity Tolerance: Patient tolerated treatment well;Patient limited by fatigue General Behavior During Therapy: [facility] for tasks assessed/performed  GP    Britten Parady R 09/03/2013, 4:58 PM

## 2013-09-06 ENCOUNTER — Telehealth: Payer: Self-pay | Admitting: Orthopedic Surgery

## 2013-09-06 ENCOUNTER — Ambulatory Visit (HOSPITAL_COMMUNITY)
Admission: RE | Admit: 2013-09-06 | Discharge: 2013-09-06 | Disposition: A | Discharge status: Home / Self Care | Payer: BC Managed Care – PPO | Source: Ambulatory Visit | Attending: Pulmonary Disease | Admitting: Pulmonary Disease

## 2013-09-06 NOTE — Telephone Encounter (Signed)
Results relayed. 

## 2013-09-06 NOTE — Telephone Encounter (Signed)
Patient had Zachary Murillo call and see about getting results from MRI patient had previously had. Zachary Murillo stated they were going out of town the end of this week and they were wanting results prior to then. Patient phone is [phone]

## 2013-09-06 NOTE — Telephone Encounter (Signed)
Routing to Dr Harrison 

## 2013-09-06 NOTE — Progress Notes (Signed)
Physical Therapy Treatment Patient Details  Name: Zachary Murillo. MRN: [id number] Date of Birth: [birth year]  Today's Date: 09/06/2013 Time: 5956-3875 PT Time Calculation (min): 47 min Charge: TE [id number]  Visit#: 10 of 24  Re-eval: 10/01/13 Assessment Diagnosis: Lumbar spine stiffness following L4-S1 spine fusion.  Surgical Date: 06/28/13 Next MD Visit: Anastasia Fiedler 6 months Prior Therapy: yes, helpful.   Authorization: BCBS  Authorization Time Period:    Authorization Visit#: 10 of 24   Subjective: Symptoms/Limitations Symptoms: Pain free today  Pt reports MD happy with progress, ability to drive now. Pain Assessment Currently in Pain?: No/denies  Precautions/Restrictions  Precautions Precautions: Back Restrictions Other Position/Activity Restrictions: no bending, lifting, twisting.   Exercise/Treatments Stretches Active Hamstring Stretch: 3 reps;30 seconds;Limitations Active Hamstring Stretch Limitations: Standing 14" box 3 directinos Hip Flexor Stretch: 3 reps;30 seconds Hip Flexor Stretch Limitations: Standing 14" box Piriformis Stretch: 3 reps;30 seconds Standing Other Standing Lumbar Exercises: Lateral step ups on 4" x15 each; rockerboard  2 min; Gait training 4RT focusing on equal stance phase utilized SPC each UE to improve arm swing for balance iwth gait Other Standing Lumbar Exercises: weight shifting 20 reps, squats 5 sets x 5 reps in neutral, ER, IR and split stance with each foot forward Supine Dead Bug: 20 reps;3 seconds Bridge: 20 reps;5 seconds Straight Leg Raise: 15 reps;Limitations Straight Leg Raises Limitations: floating SLR Quadruped Single Arm Raise: Left;10 reps/ no Rt UE due to shoulder issues Straight Leg Raise: 10 reps;Limitations Straight Leg Raises Limitations: Bil LE     Physical Therapy Assessment and Plan PT Assessment and Plan Clinical Impression Statement: Session focus on gluteal and core stregntheing exercises as well as  improving gait.  Pt demonstrated with increased antalgic gait mechanics without SPC, multimodal cueing and therapist faciliation to increase stance phase Lt LE and improve  heel to toe pattern.  Good gait mechanics noted following vc-ing and utilization of SPC in each UE with therapist faciliation  to improve arm swing with opposite LE.  Progressed coordination and abdominal stabilty to dead bed exercise to improve UE/LE coordination.  Noted improved trunk mobility through session, pt continues to demonstrate impaired gait due to weak gluteal and core strength.  No report of pian through session.   PT Plan: Continue with stretches to improve flexibiltiy/mobility and use joint mobilizations as indicated to increase hip iternal rotation.  Progress gluteal and core strengthening exercises.      Goals PT Short Term Goals PT Short Term Goal 1: Increase ankle dorsiflexion to 15 degrees to decrease early heel rise durign gait PT Short Term Goal 1 - Progress: Progressing toward goal PT Short Term Goal 3: Increase hamstring mobility to a negative 90/90 hamstring test so patient can bend forward to touch toes without bending back.  PT Short Term Goal 3 - Progress: Progressing toward goal PT Short Term Goal 4: Increase hip internal rotation to 35 degrees to improve shock absorption at foot strike durign gait and decrease toe out durin gait.  PT Short Term Goal 4 - Progress: Progressing toward goal PT Long Term Goals PT Long Term Goal 1: Increase glut max strength to 4/5 so patient can perform sit to stand  5 times in less than 15 seconds withotu UE support PT Long Term Goal 1 - Progress: Progressing toward goal PT Long Term Goal 2: Increase glut med strength to 4/5 so patient can ambulate ambulate withotu trendelenburg gait PT Long Term Goal 2 - Progress: Progressing toward goal  Problem List  Patient Active Problem List   Diagnosis Date Noted  . DDD (degenerative disc disease), 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/2013  .  Difficulty in walking(719.7) 01/25/2013  . CVA (cerebral infarction) 01/22/2013  . Lack of coordination 01/22/2013  . OA (osteoarthritis) of knee 09/05/2011  . Knee pain, right 09/05/2011  . KNEE, ARTHRITIS, DEGEN./OSTEO 01/30/2009  . SHOULDER PAIN 01/16/2009  . KNEE PAIN 01/16/2009  . IMPINGEMENT SYNDROME 01/16/2009  . TENDINITIS, LEFT KNEE 01/16/2009    PT - End of Session Activity Tolerance: Patient tolerated treatment well;Patient limited by fatigue General Behavior During Therapy: [facility] for tasks assessed/performed  GP    Aldona Lento 09/06/2013, 12:04 PM

## 2013-09-07 ENCOUNTER — Ambulatory Visit (HOSPITAL_COMMUNITY): Payer: BC Managed Care – PPO

## 2013-09-08 ENCOUNTER — Ambulatory Visit (HOSPITAL_COMMUNITY)
Admission: RE | Admit: 2013-09-08 | Discharge: 2013-09-08 | Disposition: A | Discharge status: Home / Self Care | Payer: BC Managed Care – PPO | Source: Ambulatory Visit | Attending: Pulmonary Disease | Admitting: Pulmonary Disease

## 2013-09-08 DIAGNOSIS — M545 Low back pain, unspecified: Secondary | ICD-10-CM | POA: Insufficient documentation

## 2013-09-08 DIAGNOSIS — R269 Unspecified abnormalities of gait and mobility: Secondary | ICD-10-CM | POA: Insufficient documentation

## 2013-09-08 DIAGNOSIS — IMO0001 Reserved for inherently not codable concepts without codable children: Secondary | ICD-10-CM | POA: Insufficient documentation

## 2013-09-08 DIAGNOSIS — R262 Difficulty in walking, not elsewhere classified: Secondary | ICD-10-CM | POA: Insufficient documentation

## 2013-09-08 DIAGNOSIS — E119 Type 2 diabetes mellitus without complications: Secondary | ICD-10-CM | POA: Insufficient documentation

## 2013-09-08 DIAGNOSIS — M5380 Other specified dorsopathies, site unspecified: Secondary | ICD-10-CM | POA: Insufficient documentation

## 2013-09-08 DIAGNOSIS — R209 Unspecified disturbances of skin sensation: Secondary | ICD-10-CM | POA: Insufficient documentation

## 2013-09-08 NOTE — Progress Notes (Signed)
Physical Therapy Treatment Patient Details  Name: Zachary Murillo. MRN: [id number] Date of Birth: 07-02-1950  Today's Date: 09/08/2013 Time: 6160-7371 PT Time Calculation (min): 41 min Charge: TE [id number]  Visit#: 11 of 24  Re-eval: 10/01/13 Assessment Diagnosis: Lumbar spine stiffness following L4-S1 spine fusion.  Surgical Date: 06/28/13 Next MD Visit: Anastasia Fiedler 6 months Prior Therapy: yes, helpful.   Authorization: BCBS  Authorization Time Period:    Authorization Visit#: 11 of 24   Subjective: Symptoms/Limitations Symptoms: Pt stated thighs were sore following last session.  Wife arrived into dept to discuss exercises to do at beach.  Pt reported call from MD stating rotator cuff looked good but needs a shoulder replacement Pain Assessment Currently in Pain?: Yes Pain Score: 4  Pain Location: Leg Pain Orientation: Upper;Anterior;Right;Left (Quads)  Precautions/Restrictions  Precautions Precautions: Back Restrictions Other Position/Activity Restrictions: no bending, lifting, twisting.   Exercise/Treatments Stretches Active Hamstring Stretch: 3 reps;30 seconds;Limitations Active Hamstring Stretch Limitations: Standing 14" box 3 directinos Hip Flexor Stretch: 3 reps;30 seconds Hip Flexor Stretch Limitations: Standing 14" box Quad Stretch: 3 reps;30 seconds Piriformis Stretch: 3 reps;30 seconds Standing Other Standing Lumbar Exercises: Gastroc st slant board 3x 30" Other Standing Lumbar Exercises: weight shifting and squats in neutral x 10 reps (reduced due to Supine Dead Bug: 20 reps;3 seconds Bridge: 20 reps;5 seconds Sidelying Clam: 5 reps;Limitations Clam Limitations: 10 second holds Prone  Straight Leg Raise: 15 reps    Physical Therapy Assessment and Plan PT Assessment and Plan Clinical Impression Statement: Reduced intensity with therex following reports of increased soreness Bil quads following squats last session.  Continued with stretches for Bil LE  flexibilty and hip mobilty exercises. Therapist faciliation to improve stability with mat activites.  Pt reported decreased soreness at end of session with improved hip mobility with gait.   PT Plan: Continue with stretches to improve flexibiltiy/mobility and use joint mobilizations as indicated to increase hip iternal rotation.  Progress gluteal and core strengthening exercises.      Goals PT Short Term Goals PT Short Term Goal 1: Increase ankle dorsiflexion to 15 degrees to decrease early heel rise durign gait PT Short Term Goal 1 - Progress: Progressing toward goal PT Short Term Goal 3: Increase hamstring mobility to a negative 90/90 hamstring test so patient can bend forward to touch toes without bending back.  PT Short Term Goal 3 - Progress: Progressing toward goal PT Short Term Goal 4: Increase hip internal rotation to 35 degrees to improve shock absorption at foot strike durign gait and decrease toe out durin gait.  PT Short Term Goal 4 - Progress: Progressing toward goal PT Long Term Goals PT Long Term Goal 1: Increase glut max strength to 4/5 so patient can perform sit to stand  5 times in less than 15 seconds withotu UE support PT Long Term Goal 1 - Progress: Progressing toward goal PT Long Term Goal 2: Increase glut med strength to 4/5 so patient can ambulate ambulate withotu trendelenburg gait PT Long Term Goal 2 - Progress: Progressing toward goal  Problem List Patient Active Problem List   Diagnosis Date Noted  . DDD (degenerative disc disease), 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/2013  . Difficulty in walking(719.7) 01/25/2013  . CVA (cerebral infarction) 01/22/2013  . Lack of coordination 01/22/2013  . OA (osteoarthritis) of knee 09/05/2011  . Knee pain, right 09/05/2011  . KNEE, ARTHRITIS, DEGEN./OSTEO 01/30/2009  . SHOULDER PAIN 01/16/2009  . KNEE PAIN 01/16/2009  . IMPINGEMENT SYNDROME 01/16/2009  . TENDINITIS, LEFT  KNEE 01/16/2009    PT - End of Session Activity Tolerance:  Patient tolerated treatment well;Patient limited by fatigue General Behavior During Therapy: [facility] for tasks assessed/performed  GP    Aldona Lento 09/08/2013, 11:49 AM

## 2013-09-09 ENCOUNTER — Ambulatory Visit (HOSPITAL_COMMUNITY): Payer: BC Managed Care – PPO

## 2013-09-14 ENCOUNTER — Ambulatory Visit (HOSPITAL_COMMUNITY): Payer: BC Managed Care – PPO

## 2013-09-16 ENCOUNTER — Ambulatory Visit (HOSPITAL_COMMUNITY): Payer: BC Managed Care – PPO | Admitting: Physical Therapy

## 2013-09-20 ENCOUNTER — Ambulatory Visit (HOSPITAL_COMMUNITY)
Admission: RE | Admit: 2013-09-20 | Discharge: 2013-09-20 | Disposition: A | Discharge status: Home / Self Care | Payer: BC Managed Care – PPO | Source: Ambulatory Visit | Attending: Pulmonary Disease | Admitting: Pulmonary Disease

## 2013-09-20 NOTE — Progress Notes (Signed)
Physical Therapy Treatment Patient Details  Name: Zachary Murillo. MRN: [id number] Date of Birth: [birth year]  Today's Date: 09/20/2013 Time: 7371-0626 PT Time Calculation (min): 76 min  Visit#: 12 of 24  Re-eval: 10/01/13 Authorization: BCBS  Authorization Visit#: 12 of 24  Charges:  therex [id number] (57'), nustep 1350-1420 (30' n/c)  Subjective:  Pt states he had a nice vacation without any problems.  States he did his HEP.  Currently without pain.   Exercise/Treatments Stretches Active Hamstring Stretch: 3 reps;30 seconds;Limitations Active Hamstring Stretch Limitations: Standing 14" box 3 directinos Hip Flexor Stretch: 3 reps;30 seconds Hip Flexor Stretch Limitations: Standing 14" box Quad Stretch: 3 reps;30 seconds Piriformis Stretch: 3 reps;30 seconds Standing Functional Squats: 15 reps;Limitations Functional Squats Limitations: neutral, split stance 10x each  Other Standing Lumbar Exercises: Gastroc st slant board 3x 30" Supine Dead Bug: 20 reps;3 seconds Bridge: 20 reps;5 seconds Sidelying Clam: 5 reps;Limitations Clam Limitations: 10 second holds Prone  Straight Leg Raise: 15 reps    Physical Therapy Assessment and Plan PT Assessment and Plan Clinical Impression Statement: Pt able to complete all activities with therapist facilitation. Pt able to continue HEP while on vacation according to wife.  Pt c/o jumping LE's at night and difficulty sleeping; suggested he see MD if continues.  Pt finished session on nustep 30 minutes L3 hills #3 (no charge) to work on endurance. PT Plan: Continue with stretches to improve flexibiltiy/mobility and use joint mobilizations as indicated to increase hip internal rotation.  Progress gluteal and core strengthening exercises.       Problem List Patient Active Problem List   Diagnosis Date Noted  . DDD (degenerative disc disease), 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/2013  . Difficulty in walking(719.7) 01/25/2013  . CVA (cerebral infarction)  01/22/2013  . Lack of coordination 01/22/2013  . OA (osteoarthritis) of knee 09/05/2011  . Knee pain, right 09/05/2011  . KNEE, ARTHRITIS, DEGEN./OSTEO 01/30/2009  . SHOULDER PAIN 01/16/2009  . KNEE PAIN 01/16/2009  . IMPINGEMENT SYNDROME 01/16/2009  . TENDINITIS, LEFT KNEE 01/16/2009    PT - End of Session Activity Tolerance: Patient tolerated treatment well;Patient limited by fatigue General Behavior During Therapy: [facility] for tasks assessed/performed   Teena Irani, PTA/CLT 09/20/2013, 2:07 PM

## 2013-09-21 ENCOUNTER — Ambulatory Visit (HOSPITAL_COMMUNITY): Payer: BC Managed Care – PPO | Admitting: Physical Therapy

## 2013-09-23 ENCOUNTER — Ambulatory Visit (HOSPITAL_COMMUNITY)
Admission: RE | Admit: 2013-09-23 | Discharge: 2013-09-23 | Disposition: A | Discharge status: Home / Self Care | Payer: BC Managed Care – PPO | Source: Ambulatory Visit | Attending: Pulmonary Disease | Admitting: Pulmonary Disease

## 2013-09-23 NOTE — Progress Notes (Signed)
Physical Therapy Treatment Patient Details  Name: Zachary Murillo. 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  Surgical Date: 06/28/13 Next MD Visit: Anastasia Fiedler 6 months Prior Therapy: yes, helpful.   Authorization: BC[employer]  Authorization Time Period:    Authorization Visit#: 13 of 24   Subjective: Symptoms/Limitations Symptoms: Patienent states minor pain/soreness in low back hips primarily with walking Pain Assessment Currently in Pain?: No/denies  Precautions/Restrictions  Precautions Precautions: Back Restrictions Other Position/Activity Restrictions: no bending, lifting, twisting.   Exercise/Treatments Stretches Active Hamstring Stretch: 3 reps;30 seconds;Limitations Active Hamstring Stretch Limitations: Standing 14" box 3 directinos Hip Flexor Stretch: 3 reps;30 seconds Hip Flexor Stretch Limitations: Standing 14" box Quad Stretch: 3 reps;30 seconds Piriformis Stretch: 30 seconds;5 reps;Limitations Piriformis Stretch Limitations: performed by therpist ITB stretch x 10seconds Standing Functional Squats: 15 reps;Limitations Functional Squats Limitations: neutral, split stance 10x each  Side Lunge: 10 reps Side Lunge Limitations: 2 sets, 1st to 8" and 2nd to floor Supine Bridge: 20 reps;5 seconds  Physical Therapy Assessment and Plan PT Assessment and Plan Clinical Impression Statement: patient's exercises profgressed thsi session to improve gait mechanics as patient initially demosntrated trendelenberg with gait bilaterally. Patient's gailt mildly improved follwoign lunging and ITB stretching  patient noted decreased low back soreness following session with increased hip and thigh soreness.  PT Plan: Continue with stretches to improve  flexibiltiy/mobility and use joint mobilizations as indicated to increase hip internal rotation.  Progress gluteal and core strengthening exercises to decrease hip trendelenberg during gait. .      Goals PT Short Term Goals PT Short Term Goal 1: Increase ankle dorsiflexion to 15 degrees to decrease early heel rise durign gait  Problem List Patient Active Problem List   Diagnosis Date Noted  . DDD (degenerative disc disease), 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/2013  . Difficulty in walking(719.7) 01/25/2013  . CVA (cerebral infarction) 01/22/2013  . Lack of coordination 01/22/2013  . OA (osteoarthritis) of knee 09/05/2011  . Knee pain, right 09/05/2011  . KNEE, ARTHRITIS, DEGEN./OSTEO 01/30/2009  . SHOULDER PAIN 01/16/2009  . KNEE PAIN 01/16/2009  . IMPINGEMENT SYNDROME 01/16/2009  . TENDINITIS, LEFT KNEE 01/16/2009    PT - End of Session Activity Tolerance: Patient tolerated treatment well;Patient limited by fatigue General Behavior During Therapy: [facility] for tasks assessed/performed  GP    Daeja Helderman R 09/23/2013, 3:13 PM

## 2013-09-27 ENCOUNTER — Ambulatory Visit (HOSPITAL_COMMUNITY)
Admission: RE | Admit: 2013-09-27 | Discharge: 2013-09-27 | Disposition: A | Discharge status: Home / Self Care | Payer: BC Managed Care – PPO | Source: Ambulatory Visit | Attending: Pulmonary Disease | Admitting: Pulmonary Disease

## 2013-09-27 NOTE — Progress Notes (Signed)
Physical Therapy Treatment Patient Details  Name: Zachary Murillo. MRN: [id number] Date of Birth: [birth year]  Today's Date: 09/27/2013 Time: 1302-1340 PT Time Calculation (min): 38 min Charge: TE 3094-0768   Visit#: 14 of 24  Re-eval: 10/01/13    Authorization: BCBS  Authorization Time Period:    Authorization Visit#: 14 of 24   Subjective: Symptoms/Limitations Symptoms: Minimal pain today 3/10  Pain Assessment Currently in Pain?: Yes Pain Score: 3  Pain Location: Back Pain Orientation: Right  Objective:   Exercise/Treatments Stretches Active Hamstring Stretch: 3 reps;30 seconds;Limitations Active Hamstring Stretch Limitations: Standing 14" box 3 directinos Hip Flexor Stretch: 3 reps;30 seconds Hip Flexor Stretch Limitations: Standing 14" box Piriformis Stretch: 30 seconds;5 reps;Limitations Piriformis Stretch Limitations: performed by therpist Standing Functional Squats: 15 reps;Limitations Functional Squats Limitations: neutral, split stance 10x each  Forward Lunge: 10 reps;Limitations Forward Lunge Limitations: onto 6 in box Side Lunge: 10 reps Side Lunge Limitations: 2 sets, 1st to 8" and 2nd to floor Other Standing Lumbar Exercises: Sidestepping 2RT with green tband  Other Standing Lumbar Exercises: standing abduction Bil with greentheraband 20x Bil LE    Physical Therapy Assessment and Plan PT Assessment and Plan Clinical Impression Statement: Session focus on improving gluteal medius to improve gait mechancis and reduce trendelenberg gait mechanics.  Pt did required verbal cueing to improve foot placement for maximum muscle contraction and posture to reduce forward flexed trunk.  Continuted with stretches to improve flexibilty with imporoved gait mechanics noted.   PT Plan: Continue with stretches to improve flexibiltiy/mobility and use joint mobilizations as indicated to increase hip internal rotation.  Progress gluteal and core strengthening exercises to  decrease hip trendelenberg during gait. .      Goals PT Short Term Goals PT Short Term Goal 1: Increase ankle dorsiflexion to 15 degrees to decrease early heel rise durign gait PT Short Term Goal 3: Increase hamstring mobility to a negative 90/90 hamstring test so patient can bend forward to touch toes without bending back.  PT Short Term Goal 3 - Progress: Progressing toward goal PT Short Term Goal 4: Increase hip internal rotation to 35 degrees to improve shock absorption at foot strike durign gait and decrease toe out durin gait.  PT Short Term Goal 4 - Progress: Progressing toward goal PT Long Term Goals PT Long Term Goal 1: Increase glut max strength to 4/5 so patient can perform sit to stand  5 times in less than 15 seconds withotu UE support PT Long Term Goal 1 - Progress: Progressing toward goal PT Long Term Goal 2: Increase glut med strength to 4/5 so patient can ambulate ambulate withotu trendelenburg gait PT Long Term Goal 2 - Progress: Progressing toward goal  Problem List Patient Active Problem List   Diagnosis Date Noted  . DDD (degenerative disc disease), 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/2013  . Difficulty in walking(719.7) 01/25/2013  . CVA (cerebral infarction) 01/22/2013  . Lack of coordination 01/22/2013  . OA (osteoarthritis) of knee 09/05/2011  . Knee pain, right 09/05/2011  . KNEE, ARTHRITIS, DEGEN./OSTEO 01/30/2009  . SHOULDER PAIN 01/16/2009  . KNEE PAIN 01/16/2009  . IMPINGEMENT SYNDROME 01/16/2009  . TENDINITIS, LEFT KNEE 01/16/2009    PT - End of Session Activity Tolerance: Patient tolerated treatment well;Patient limited by fatigue General Behavior During Therapy: [facility] for tasks assessed/performed  GP    Aldona Lento 09/27/2013, 1:40 PM

## 2013-09-28 ENCOUNTER — Ambulatory Visit (HOSPITAL_COMMUNITY): Payer: BC Managed Care – PPO

## 2013-09-30 ENCOUNTER — Ambulatory Visit (HOSPITAL_COMMUNITY)
Admission: RE | Admit: 2013-09-30 | Discharge: 2013-09-30 | Disposition: A | Discharge status: Home / Self Care | Payer: BC Managed Care – PPO | Source: Ambulatory Visit | Attending: Pulmonary Disease | Admitting: Pulmonary Disease

## 2013-09-30 ENCOUNTER — Ambulatory Visit (HOSPITAL_COMMUNITY): Payer: BC Managed Care – PPO | Admitting: Physical Therapy

## 2013-09-30 NOTE — Progress Notes (Signed)
Physical Therapy Re-evaluation/Treatment note  Patient Details  Name: Zachary Murillo. 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  (Simultaneous filing. User may not have seen previous data.) Surgical Date: 06/28/13 (Simultaneous filing. User may not have seen previous data.) Next MD Visit: Anastasia Fiedler 6 months (Simultaneous filing. User may not have seen previous data.) Prior Therapy: yes, helpful.  (Simultaneous filing. User may not have seen previous data.)  Authorization: BCBS    Authorization Time Period:    Authorization Visit#: 15 of 24   Subjective Symptoms/Limitations Symptoms: Pt stated most difficulty with balance and pain getting into Jeep.  Currently pain free today. Pain Assessment Currently in Pain?: No/denies  Precautions/Restrictions  Precautions Precautions: Back Restrictions Other Position/Activity Restrictions: no bending, lifting, twisting.   Assessment RLE AROM (degrees) Right Hip Extension:  (was 15) Right Hip Flexion: 78 Right Ankle Dorsiflexion: 12 (was 5) RLE Strength Right Hip Flexion: 5/5 Right Hip Extension: 4/5 (was 3+/5) Right Hip External Rotation : 50 (was 40) Right Hip Internal Rotation : 20 (was 14) Right Hip ABduction: 4/5 Right Hip ADduction: 5/5 Right Knee Flexion: 4/5 (was 4/5) Right Knee Extension: 5/5 Right Ankle Dorsiflexion:  (was 4/5) Lumbar Strength Lumbar Flexion: 4/5 (was 3+/5)  Exercise/Treatments Stretches Active Hamstring Stretch: 3 reps;30 seconds;Limitations Active Hamstring Stretch Limitations: Standing 14" box 3 directinos Hip Flexor Stretch: 3 reps;30 seconds Hip Flexor Stretch Limitations: Standing 14" box Piriformis Stretch: 30 seconds;5  reps;Limitations Piriformis Stretch Limitations: seated piriformis Standing Forward Lunge: 10 reps;Limitations Forward Lunge Limitations: onto 6 in box Side Lunge: 10 reps;Limitations Side Lunge Limitations: 6in step Seated Other Seated Lumbar Exercises: Sitting on BOSU reaching outside BOS 5x    Physical Therapy Assessment and Plan PT Assessment and Plan Clinical Impression Statement: Reassessment complete with the following findings:  Pt independent with HEP and able to demonstrate appropraite form and technique with exercises.  Overall LE strength is improving, pt continues to have weak gluteal musculature.  AROM is improving, pt able to cross legs independently with piriformis stretch this session.  Pt stated difficulty with balance, stated he had difficutly putting on pants and getting in and out of Jeep.  Discussion held with evaluating PT, added balance goal to POC.   PT Plan: Recommend continuing OPPT for 4 more weeks to improve hip mobility, core strength and gluteal strengtheing.  Next session continue with forward and side lunges, hurdles forward and sideways, squat reach matrix.  Progress core strength.    Add sumo walk.   Goals Home Exercise Program Pt/caregiver will Perform Home Exercise Program: For increased ROM;For increased strengthening PT Goal: Perform Home Exercise Program - Progress: Partly met PT Short Term Goals PT Short Term Goal 1: Increase ankle dorsiflexion to 15 degrees to decrease early heel rise durign gait PT Short Term Goal 1 - Progress: Progressing toward goal PT Short Term Goal 2: Increase hip extension to 15degrees to increase stride length during gait PT Short Term Goal 2 - Progress: Met PT Short Term Goal 3: Increase hamstring mobility to a negative 90/90 hamstring test so patient can bend forward to touch toes without bending back.  PT Short Term Goal 3 - Progress: Progressing toward goal PT Short Term Goal 4: Increase hip internal rotation  to 35  degrees to improve shock absorption at foot strike durign gait and decrease toe out durin gait.  PT Short Term Goal 4 - Progress: Progressing toward goal PT Long Term Goals PT Long Term Goal 1: Increase glut max strength to 4/5 so patient can perform sit to stand  5 times in less than 15 seconds withotu UE support PT Long Term Goal 1 - Progress: Met PT Long Term Goal 2: Increase glut med strength to 4/5 so patient can ambulate ambulate withotu trendelenburg gait PT Long Term Goal 2 - Progress: Partly met Long Term Goal 3: Increase glut max/med strength to 5/5 so patient can ambulate up and down stairs without UE support and withtou an abnormal gait pattern.  Long Term Goal 3 Progress: Progressing toward goal Long Term Goal 4: Pt able to stand SLS for 10 seconds to reduce risk of falling  Problem List Patient Active Problem List   Diagnosis Date Noted  . DDD (degenerative disc disease), 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/2013  . Difficulty in walking(719.7) 01/25/2013  . CVA (cerebral infarction) 01/22/2013  . Lack of coordination 01/22/2013  . OA (osteoarthritis) of knee 09/05/2011  . Knee pain, right 09/05/2011  . KNEE, ARTHRITIS, DEGEN./OSTEO 01/30/2009  . SHOULDER PAIN 01/16/2009  . KNEE PAIN 01/16/2009  . IMPINGEMENT SYNDROME 01/16/2009  . TENDINITIS, LEFT KNEE 01/16/2009    PT - End of Session Activity Tolerance: Patient tolerated treatment well General Behavior During Therapy: [facility] for tasks assessed/performed  GP    Aldona Lento 09/30/2013, 3:28 PM  Devona Konig PT DPT 09/30/13  4:36  Physician Documentation Your signature is required to indicate approval of the treatment plan as stated above.  Please sign and either send electronically or make a copy of this report for your files and return this physician signed original.   Please mark one 1.__approve of plan  2. ___approve of plan with the following conditions.   ______________________________                                                           _____________________ Physician Signature                                                                                                             Date

## 2013-09-30 NOTE — Progress Notes (Signed)
Physical Therapy Re-evaluation/Treatment note  Patient Details  Name: Zachary Murillo. 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  (Simultaneous filing. User may not have seen previous data.) Surgical Date: 06/28/13 (Simultaneous filing. User may not have seen previous data.) Next MD Visit: Anastasia Fiedler 6 months (Simultaneous filing. User may not have seen previous data.) Prior Therapy: yes, helpful.  (Simultaneous filing. User may not have seen previous data.)  Authorization: BCBS    Authorization Time Period:    Authorization Visit#: 15 of 24   Subjective Symptoms/Limitations Symptoms: Pt stated most difficulty with balance and pain getting into Jeep.  Currently pain free today. Pain Assessment Currently in Pain?: No/denies  Precautions/Restrictions  Precautions Precautions: Back Restrictions Other Position/Activity Restrictions: no bending, lifting, twisting.   Assessment RLE AROM (degrees) Right Hip Extension:  (was 15) Right Hip Flexion: 78 Right Ankle Dorsiflexion: 12 (was 5) RLE Strength Right Hip Flexion: 5/5 Right Hip Extension: 4/5 (was 3+/5) Right Hip External Rotation : 50 (was 40) Right Hip Internal Rotation : 20 (was 14) Right Hip ABduction: 4/5 Right Hip ADduction: 5/5 Right Knee Flexion: 4/5 (was 4/5) Right Knee Extension: 5/5 Right Ankle Dorsiflexion:  (was 4/5) Lumbar Strength Lumbar Flexion: 4/5 (was 3+/5)  Exercise/Treatments Stretches Active Hamstring Stretch: 3 reps;30 seconds;Limitations Active Hamstring Stretch Limitations: Standing 14" box 3 directinos Hip Flexor Stretch: 3 reps;30 seconds Hip Flexor Stretch Limitations: Standing 14" box Piriformis Stretch: 30 seconds;5  reps;Limitations Piriformis Stretch Limitations: seated piriformis Standing Forward Lunge: 10 reps;Limitations Forward Lunge Limitations: onto 6 in box Side Lunge: 10 reps;Limitations Side Lunge Limitations: 6in step Seated Other Seated Lumbar Exercises: Sitting on BOSU reaching outside BOS 5x    Physical Therapy Assessment and Plan PT Assessment and Plan Clinical Impression Statement: Reassessment complete with the following findings:  Pt independent with HEP and able to demonstrate appropraite form and technique with exercises.  Overall LE strength is improving, pt continues to have weak gluteal musculature.  AROM is improving, pt able to cross legs independently with piriformis stretch this session.  Pt stated difficulty with balance, stated he had difficutly putting on pants and getting in and out of Jeep.  Discussion held with evaluating PT, added balance goal to POC.   PT Plan: Recommend continuing OPPT for 4 more weeks to improve hip mobility, core strength and gluteal strengtheing.  Next session continue with forward and side lunges, hurdles forward and sideways, squat reach matrix.  Progress core strength.      Goals Home Exercise Program Pt/caregiver will Perform Home Exercise Program: For increased ROM;For increased strengthening PT Goal: Perform Home Exercise Program - Progress: Partly met PT Short Term Goals PT Short Term Goal 1: Increase ankle dorsiflexion to 15 degrees to decrease early heel rise durign gait PT Short Term Goal 1 - Progress: Progressing toward goal PT Short Term Goal 2: Increase hip extension to 15degrees to increase stride length during gait PT Short Term Goal 2 - Progress: Met PT Short Term Goal 3: Increase hamstring mobility to a negative 90/90 hamstring test so patient can bend forward to touch toes without bending back.  PT Short Term Goal 3 - Progress: Progressing toward goal PT Short Term Goal 4: Increase hip internal rotation to 35 degrees  to improve  shock absorption at foot strike durign gait and decrease toe out durin gait.  PT Short Term Goal 4 - Progress: Progressing toward goal PT Long Term Goals PT Long Term Goal 1: Increase glut max strength to 4/5 so patient can perform sit to stand  5 times in less than 15 seconds withotu UE support PT Long Term Goal 1 - Progress: Met PT Long Term Goal 2: Increase glut med strength to 4/5 so patient can ambulate ambulate withotu trendelenburg gait PT Long Term Goal 2 - Progress: Partly met Long Term Goal 3: Increase glut max/med strength to 5/5 so patient can ambulate up and down stairs without UE support and withtou an abnormal gait pattern.  Long Term Goal 3 Progress: Progressing toward goal Long Term Goal 4: Pt able to stand SLS for 10 seconds to reduce risk of falling  Problem List Patient Active Problem List   Diagnosis Date Noted  . DDD (degenerative disc disease), 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/2013  . Difficulty in walking(719.7) 01/25/2013  . CVA (cerebral infarction) 01/22/2013  . Lack of coordination 01/22/2013  . OA (osteoarthritis) of knee 09/05/2011  . Knee pain, right 09/05/2011  . KNEE, ARTHRITIS, DEGEN./OSTEO 01/30/2009  . SHOULDER PAIN 01/16/2009  . KNEE PAIN 01/16/2009  . IMPINGEMENT SYNDROME 01/16/2009  . TENDINITIS, LEFT KNEE 01/16/2009    PT - End of Session Activity Tolerance: Patient tolerated treatment well General Behavior During Therapy: [facility] for tasks assessed/performed  GP    Aldona Lento 09/30/2013, 3:28 PM  Physician Documentation Your signature is required to indicate approval of the treatment plan as stated above.  Please sign and either send electronically or make a copy of this report for your files and return this physician signed original.   Please mark one 1.__approve of plan  2. ___approve of plan with the following conditions.   ______________________________                                                           _____________________ Physician Signature                                                                                                             Date

## 2013-10-04 ENCOUNTER — Ambulatory Visit (HOSPITAL_COMMUNITY)
Admission: RE | Admit: 2013-10-04 | Discharge: 2013-10-04 | Disposition: A | Discharge status: Home / Self Care | Payer: BC Managed Care – PPO | Source: Ambulatory Visit | Attending: Pulmonary Disease | Admitting: Pulmonary Disease

## 2013-10-04 NOTE — Progress Notes (Signed)
Physical Therapy Treatment Patient Details  Name: Cindy Brindisi. 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  Surgical Date: 06/28/13 Next MD Visit: Anastasia Fiedler 6 months Prior Therapy: yes, helpful.   Authorization: [employer]  Authorization Time Period:    Authorization Visit#: 16 of 24   Subjective: Symptoms/Limitations Symptoms: Pt stated he is currently pain free today.  Reported incident "fall" with wife trying to help up from low couch.   Pain Assessment Currently in Pain?: No/denies  Precautions/Restrictions  Precautions Precautions: Back Restrictions Other Position/Activity Restrictions: no bending, lifting, twisting.   Exercise/Treatments Stretches Active Hamstring Stretch: 3 reps;30 seconds;Limitations Active Hamstring Stretch Limitations: Standing 14" box 3 directinos Hip Flexor Stretch: 3 reps;30 seconds Hip Flexor Stretch Limitations: Standing 14" box Piriformis Stretch: 4 reps;30 seconds;Limitations Piriformis Stretch Limitations: seated piriformis Standing Functional Squats: 5 reps;Limitations Functional Squats Limitations: squat reach matrix 3#  Forward Lunge: 10 reps;Limitations Forward Lunge Limitations: onto 6 in box Side Lunge: 10 reps;Limitations Side Lunge Limitations: 6in step Other Standing Lumbar Exercises: Suma walking 2RT Other Standing Lumbar Exercises: 6 and 12in hurdler alternating forward and sidestepping 2RT; SLS L25", R 4"; Marching alternating 1 finger A 10x 10" holds   Physical Therapy Assessment and Plan PT Assessment and Plan Clinical Impression Statement: Progressed functional strengthening and balance activities to assist wtih increase ease with functional activities.  Began hurdles 6 and 12in forward and sidestepping,  marching and SLS activties to improve single leg activities.  Therapist facilitation to improve form with gluteal strengthening activities.  No reports of increased pain.   PT Plan: Continue with current POC to improve hip mobility, core strength and gluteal strengtheing.  Continue with forward and side lunges, hurdles forward and sideways, squat reach matrix and SLS activtiies.  Progress core strength.      Goals PT Short Term Goals PT Short Term Goal 1: Increase ankle dorsiflexion to 15 degrees to decrease early heel rise durign gait PT Short Term Goal 1 - Progress: Progressing toward goal PT Short Term Goal 3: Increase hamstring mobility to a negative 90/90 hamstring test so patient can bend forward to touch toes without bending back.  PT Short Term Goal 3 - Progress: Progressing toward goal PT Short Term Goal 4: Increase hip internal rotation to 35 degrees to improve shock absorption at foot strike durign gait and decrease toe out durin gait.  PT Short Term Goal 4 - Progress: Progressing toward goal PT Long Term Goals PT Long Term Goal 2: Increase glut med strength to 4/5 so patient can ambulate ambulate withotu trendelenburg gait PT Long Term Goal 2 - Progress: Progressing toward goal Long Term Goal 3: Increase glut max/med strength to 5/5 so patient can ambulate up and down stairs without UE support and withtou an abnormal gait pattern.  Long Term Goal 3 Progress: Progressing toward goal Long Term Goal 4: Pt able to stand SLS for 10 seconds to reduce risk of falling Long Term Goal 4 Progress: Progressing toward goal  Problem List Patient Active Problem List   Diagnosis Date Noted  . DDD (degenerative disc disease), 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/2013  . Difficulty in walking(719.7) 01/25/2013  . CVA (cerebral infarction) 01/22/2013  . Lack of coordination 01/22/2013  . OA (osteoarthritis) of knee 09/05/2011  . Knee pain, right 09/05/2011  . KNEE, ARTHRITIS, DEGEN./OSTEO 01/30/2009  .  SHOULDER  PAIN 01/16/2009  . KNEE PAIN 01/16/2009  . IMPINGEMENT SYNDROME 01/16/2009  . TENDINITIS, LEFT KNEE 01/16/2009    PT - End of Session Equipment Utilized During Treatment: Gait belt Activity Tolerance: Patient tolerated treatment well General Behavior During Therapy: [facility] for tasks assessed/performed  GP    Aldona Lento 10/04/2013, 4:11 PM

## 2013-10-05 ENCOUNTER — Ambulatory Visit (HOSPITAL_COMMUNITY): Payer: BC Managed Care – PPO

## 2013-10-07 ENCOUNTER — Ambulatory Visit (HOSPITAL_COMMUNITY)
Admission: RE | Admit: 2013-10-07 | Discharge: 2013-10-07 | Disposition: A | Discharge status: Home / Self Care | Payer: BC Managed Care – PPO | Source: Ambulatory Visit | Attending: Pulmonary Disease | Admitting: Pulmonary Disease

## 2013-10-07 NOTE — Progress Notes (Signed)
Physical Therapy Treatment Patient Details  Name: Zachary Murillo. 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  Surgical Date: 06/28/13 Next MD Visit: Anastasia Fiedler 6 months Prior Therapy: yes, helpful.   Authorization: [employer]  Authorization Time Period:    Authorization Visit#: 17 of 24   Subjective:  Patient notes minor soreness but no pain.   Precautions/Restrictions  Precautions Precautions: Back Restrictions Other Position/Activity Restrictions: no bending, lifting, twisting.   Exercise/Treatments Stretches Active Hamstring Stretch: 3 reps;Limitations;20 seconds Active Hamstring Stretch Limitations: Standing 14" box 3 directinos Piriformis Stretch: 4 reps;Limitations;20 seconds Piriformis Stretch Limitations: seated piriformis Standing Functional Squats: 10 reps Functional Squats Limitations: UE asssist to 14" box and squat reach matrix Forward Lunge: 10 reps;Limitations Forward Lunge Limitations: onto 6 in box  Side Lunge: 10 reps;Limitations Side Lunge Limitations: 6in step Other Standing Lumbar Exercises: Sumo walking 1RT with blue tband and blue bal  Other Standing Lumbar Exercises: UE ground matrix sagittal and frontal plane 5x each   Physical Therapy Assessment and Plan PT Assessment and Plan Clinical Impression Statement: Progressed functional strengthening and balance activities to assist wtih increase ease with functional activities. continuehurdles 6 and 12in forward and sidestepping, marching and SLS activties to improve single leg activities. Therapist facilitation to improve form with gluteal strengthening activities. No reports of increased pain. Patient contineus to display diffiuclty squatting to a chair without UE support though squat depth is  improving.  PT Plan: Continue with current POC to improve hip mobility, core strength and gluteal strengtheing.  Continue with forward and side lunges, hurdles forward and sideways, squat reach matrix and SLS activtiies.      Goals PT Short Term Goals PT Short Term Goal 1: Increase ankle dorsiflexion to 15 degrees to decrease early heel rise durign gait PT Short Term Goal 1 - Progress: Progressing toward goal PT Short Term Goal 3: Increase hamstring mobility to a negative 90/90 hamstring test so patient can bend forward to touch toes without bending back.  PT Short Term Goal 3 - Progress: Progressing toward goal PT Short Term Goal 4: Increase hip internal rotation to 35 degrees to improve shock absorption at foot strike durign gait and decrease toe out durin gait.  PT Short Term Goal 4 - Progress: Progressing toward goal PT Long Term Goals PT Long Term Goal 2: Increase glut med strength to 4/5 so patient can ambulate ambulate withotu trendelenburg gait PT Long Term Goal 2 - Progress: Progressing toward goal Long Term Goal 3: Increase glut max/med strength to 5/5 so patient can ambulate up and down stairs without UE support and withtou an abnormal gait pattern.  Long Term Goal 3 Progress: Progressing toward goal Long Term Goal 4: Pt able to stand SLS for 10 seconds to reduce risk of falling Long Term Goal 4 Progress: Progressing toward goal  Problem List Patient Active Problem List   Diagnosis Date Noted  . DDD (degenerative disc disease), 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/2013  . Difficulty in walking(719.7) 01/25/2013  . CVA (cerebral infarction) 01/22/2013  . Lack of coordination 01/22/2013  . OA (osteoarthritis) of knee 09/05/2011  . Knee pain, right 09/05/2011  . KNEE, ARTHRITIS, DEGEN./OSTEO 01/30/2009  . SHOULDER PAIN 01/16/2009  . KNEE PAIN 01/16/2009  . IMPINGEMENT SYNDROME 01/16/2009  . TENDINITIS, LEFT KNEE 01/16/2009    PT - End of  Session Activity Tolerance: Patient tolerated  treatment well General Behavior During Therapy: [facility] for tasks assessed/performed  GP    Jasman Murri R 10/07/2013, 3:23 PM

## 2013-10-11 ENCOUNTER — Ambulatory Visit (HOSPITAL_COMMUNITY)
Admission: RE | Admit: 2013-10-11 | Discharge: 2013-10-11 | Disposition: A | Discharge status: Home / Self Care | Payer: BC Managed Care – PPO | Source: Ambulatory Visit | Attending: Internal Medicine | Admitting: Internal Medicine

## 2013-10-11 DIAGNOSIS — IMO0001 Reserved for inherently not codable concepts without codable children: Secondary | ICD-10-CM | POA: Insufficient documentation

## 2013-10-11 DIAGNOSIS — M545 Low back pain, unspecified: Secondary | ICD-10-CM | POA: Diagnosis not present

## 2013-10-11 DIAGNOSIS — R262 Difficulty in walking, not elsewhere classified: Secondary | ICD-10-CM | POA: Insufficient documentation

## 2013-10-11 DIAGNOSIS — R209 Unspecified disturbances of skin sensation: Secondary | ICD-10-CM | POA: Insufficient documentation

## 2013-10-11 DIAGNOSIS — E119 Type 2 diabetes mellitus without complications: Secondary | ICD-10-CM | POA: Insufficient documentation

## 2013-10-11 DIAGNOSIS — M5380 Other specified dorsopathies, site unspecified: Secondary | ICD-10-CM | POA: Diagnosis not present

## 2013-10-11 DIAGNOSIS — R269 Unspecified abnormalities of gait and mobility: Secondary | ICD-10-CM | POA: Diagnosis not present

## 2013-10-11 NOTE — Progress Notes (Signed)
Physical Therapy Treatment Patient Details  Name: Zachary Murillo. 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  Surgical Date: 06/28/13 Next MD Visit: Anastasia Fiedler September months; Luan Pulling 10/13/2013 Prior Therapy: yes, helpful.   Authorization: BC[employer]  Authorization Time Period:    Authorization Visit#: 18 of 24   Subjective: Symptoms/Limitations Symptoms: Currently pain free, little sore across base of back.  Pt reports morning walk for 15 minutes today. Pain Assessment Currently in Pain?: No/denies  Precautions/Restrictions  Precautions Precautions: Back Restrictions Other Position/Activity Restrictions: no bending, lifting, twisting.   Exercise/Treatments Stretches Active Hamstring Stretch: 3 reps;Limitations;20 seconds Active Hamstring Stretch Limitations: Standing 14" box 3 directinos Hip Flexor Stretch: 3 reps;30 seconds Hip Flexor Stretch Limitations: Standing 14" box Piriformis Stretch: 4 reps;Limitations;20 seconds Piriformis Stretch Limitations: seated piriformis Standing Functional Squats: 5 reps Functional Squats Limitations: squat reach matrix 3# Forward Lunge: 10 reps;Limitations Forward Lunge Limitations: onto 6 in box  Side Lunge: 10 reps;Limitations Side Lunge Limitations: 6in step Other Standing Lumbar Exercises: SLS L 21", R 4" max of 3; Forward and sidestepping over 6 and 12 in hurdles Other Standing Lumbar Exercises: UE ground matrix sagittal and frontal plane 5x each     Physical Therapy Assessment and Plan PT Assessment and Plan Clinical Impression Statement: Progressed to SLS balance activities to improve balance and functional activities with min to mod assistance required.  SLS Lt 21" and Rt 4" max of 3, cueing to improve weight distribution  prior lifting opposite foot with improved SLS.  Continued with stretches and 3D hip excursion exercise to improve hip mobility.  No reports of pain through session, pt limited by fatigue. PT Plan: Continue with current POC to improve hip mobility, core and gluteal strengthening, and SLS balance activtiies.  Next session reinforce HEP exercises prior pt.'s vacation to beach.   Continue with lunges, hurdles, squat reach matrix and progress SLS activtiesi.    Goals PT Short Term Goals PT Short Term Goal 1: Increase ankle dorsiflexion to 15 degrees to decrease early heel rise durign gait PT Short Term Goal 1 - Progress: Progressing toward goal PT Short Term Goal 2: Increase hip extension to 15degrees to increase stride length during gait PT Short Term Goal 3: Increase hamstring mobility to a negative 90/90 hamstring test so patient can bend forward to touch toes without bending back.  PT Short Term Goal 3 - Progress: Progressing toward goal PT Short Term Goal 4: Increase hip internal rotation to 35 degrees to improve shock absorption at foot strike durign gait and decrease toe out durin gait.  PT Short Term Goal 4 - Progress: Progressing toward goal PT Long Term Goals PT Long Term Goal 1: Increase glut max strength to 4/5 so patient can perform sit to stand  5 times in less than 15 seconds withotu UE support PT Long Term Goal 2: Increase glut med strength to 4/5 so patient can ambulate ambulate withotu trendelenburg gait PT Long Term Goal 2 - Progress: Progressing toward goal Long Term Goal 3: Increase glut max/med strength to 5/5 so patient can ambulate up and down stairs without UE support and withtou an abnormal gait pattern.  Long Term Goal 3 Progress: Progressing toward goal Long Term Goal 4: Pt able to stand SLS for 10 seconds to reduce risk of falling Long Term Goal 4  Progress: Progressing toward goal  Problem List Patient Active Problem List   Diagnosis Date Noted  . DDD (degenerative  disc disease), 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/2013  . Difficulty in walking(719.7) 01/25/2013  . CVA (cerebral infarction) 01/22/2013  . Lack of coordination 01/22/2013  . OA (osteoarthritis) of knee 09/05/2011  . Knee pain, right 09/05/2011  . KNEE, ARTHRITIS, DEGEN./OSTEO 01/30/2009  . SHOULDER PAIN 01/16/2009  . KNEE PAIN 01/16/2009  . IMPINGEMENT SYNDROME 01/16/2009  . TENDINITIS, LEFT KNEE 01/16/2009    PT - End of Session Equipment Utilized During Treatment: Gait belt Activity Tolerance: Patient tolerated treatment well  GP    Aldona Lento 10/11/2013, 3:51 PM

## 2013-10-13 ENCOUNTER — Ambulatory Visit (HOSPITAL_COMMUNITY)
Admission: RE | Admit: 2013-10-13 | Discharge: 2013-10-13 | Disposition: A | Discharge status: Home / Self Care | Payer: BC Managed Care – PPO | Source: Ambulatory Visit | Attending: Pulmonary Disease | Admitting: Pulmonary Disease

## 2013-10-13 DIAGNOSIS — IMO0001 Reserved for inherently not codable concepts without codable children: Secondary | ICD-10-CM | POA: Diagnosis not present

## 2013-10-13 NOTE — Progress Notes (Signed)
Physical Therapy Treatment Patient Details  Name: Zachary Murillo. MRN: [id number] Date of Birth: [birth year]  Today's Date: 10/13/2013 Time: 1300-1345 PT Time Calculation (min): 45 min Charge: TE [id number]   Visit#: 19 of 24  Re-eval: 10/28/13    Authorization: [employer]  Authorization Time Period:    Authorization Visit#: 19 of 24   Subjective: Symptoms/Limitations Symptoms: Feeling good today, pt has plans for beach next week.   Pain Assessment Currently in Pain?: No/denies  Objective:   Exercise/Treatments Stretches Active Hamstring Stretch: 3 reps;Limitations;20 seconds Active Hamstring Stretch Limitations: Standing 14" box 3 directinos Hip Flexor Stretch: 3 reps;30 seconds Hip Flexor Stretch Limitations: Standing 14" box hip flexion and groin stretch ITB Stretch: 2 reps;30 seconds Piriformis Stretch: 4 reps;Limitations;20 seconds Piriformis Stretch Limitations: seated piriformis Standing Functional Squats: 10 reps;Limitations Functional Squats Limitations: squat reach matrix 3# Forward Lunge: 10 reps;Limitations Forward Lunge Limitations: onto 6 in box  Side Lunge: 10 reps;Limitations Side Lunge Limitations: 6in step Other Standing Lumbar Exercises: SLS Lt 23", Rt 6"; 6 and 12 in hurdles forward and sidestepping wtih 5" holds Other Standing Lumbar Exercises: UE ground matrix sagittal and frontal plane 5x each; 3D hip and ankle excursion 10x each    Physical Therapy Assessment and Plan PT Assessment and Plan Clinical Impression Statement: Continued session focus on balance activities and gluteal strengthening to improve gait and functioanl activities.  Improved SLS Lt LE, pt continues to have difficutly wtih Rt LE SLS.  Added ankle excursion to improve ankle mobility with balance activities, improved SLS to 6" max of 3. Noted improved hip mobilty with piriformis mobilty and gait.  No reports of pain through session.  Reviewed HEP exercises for next week while pt is at  beach, pt able to verbalize and demonstrate approriate technique with all HEP exercises.  Pt given 3D ankle excusino printout to add to HEP. PT Plan: Continue with current POC to improve hip mobility, core and gluteal strengthening, and SLS balance activtiies.  Continue with lunges, hurdles, squat reach matrix and progress SLS activtiesi.    Goals PT Short Term Goals PT Short Term Goal 1: Increase ankle dorsiflexion to 15 degrees to decrease early heel rise durign gait PT Short Term Goal 1 - Progress: Progressing toward goal PT Short Term Goal 3: Increase hamstring mobility to a negative 90/90 hamstring test so patient can bend forward to touch toes without bending back.  PT Short Term Goal 3 - Progress: Progressing toward goal PT Short Term Goal 4: Increase hip internal rotation to 35 degrees to improve shock absorption at foot strike durign gait and decrease toe out durin gait.  PT Short Term Goal 4 - Progress: Progressing toward goal PT Long Term Goals PT Long Term Goal 2: Increase glut med strength to 4/5 so patient can ambulate ambulate withotu trendelenburg gait PT Long Term Goal 2 - Progress: Progressing toward goal Long Term Goal 3: Increase glut max/med strength to 5/5 so patient can ambulate up and down stairs without UE support and withtou an abnormal gait pattern.  Long Term Goal 3 Progress: Progressing toward goal Long Term Goal 4: Pt able to stand SLS for 10 seconds to reduce risk of falling Long Term Goal 4 Progress: Progressing toward goal  Problem List Patient Active Problem List   Diagnosis Date Noted  . DDD (degenerative disc disease), 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/2013  . Difficulty in walking(719.7) 01/25/2013  . CVA (cerebral infarction) 01/22/2013  . Lack of coordination 01/22/2013  . OA (osteoarthritis) of knee  09/05/2011  . Knee pain, right 09/05/2011  . KNEE, ARTHRITIS, DEGEN./OSTEO 01/30/2009  . SHOULDER PAIN 01/16/2009  . KNEE PAIN 01/16/2009  . IMPINGEMENT SYNDROME  01/16/2009  . TENDINITIS, LEFT KNEE 01/16/2009    PT - End of Session Equipment Utilized During Treatment: Gait belt Activity Tolerance: Patient tolerated treatment well General Behavior During Therapy: [facility] for tasks assessed/performed  GP    Aldona Lento 10/13/2013, 2:20 PM

## 2013-10-19 ENCOUNTER — Ambulatory Visit: Payer: BC Managed Care – PPO | Admitting: Orthopedic Surgery

## 2013-11-01 ENCOUNTER — Ambulatory Visit (HOSPITAL_COMMUNITY)
Admission: RE | Admit: 2013-11-01 | Discharge: 2013-11-01 | Disposition: A | Discharge status: Home / Self Care | Payer: BC Managed Care – PPO | Source: Ambulatory Visit | Attending: Pulmonary Disease | Admitting: Pulmonary Disease

## 2013-11-01 DIAGNOSIS — IMO0001 Reserved for inherently not codable concepts without codable children: Secondary | ICD-10-CM | POA: Diagnosis not present

## 2013-11-01 NOTE — Progress Notes (Signed)
Physical Therapy Re-evaluation  Patient Details  Name: Zachary Murillo. 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  Surgical Date: 06/28/13 Next MD Visit: Anastasia Fiedler September months; Luan Pulling 10/13/2013 Prior Therapy: yes, helpful.   Authorization: BCBS    Authorization Time Period:    Authorization Visit#: 20 of 28   Subjective Symptoms/Limitations Symptoms: Pain minimum today, pt had fun at beach reports compliance with HEP during stay at beach.  Work at [employer] last with a lot of walking How long can you sit comfortably?: Unlimited How long can you stand comfortably?: Able to stand 30 minutes comfortably (was 1mnutes) How long can you walk comfortably?: Walk for 30 minutes, 5,000 steps a day on MiFit with SPC for stability (was [place] >3049f < 30028fPain Assessment Currently in Pain?: Yes Pain Score: 2  Pain Location: Back Pain Orientation: Lower;Right  Precautions/Restrictions  Precautions Precautions: Back Restrictions Other Position/Activity Restrictions: no bending, lifting, twisting.   Objective: FOTO status 49% was 34%  Assessment RLE AROM (degrees) Right Hip Extension: 18 (was 15) Right Hip Flexion: 103 (was 78) Right Ankle Dorsiflexion: 15 (was 12) RLE Strength Right Hip Flexion: 5/5 Right Hip Extension: 4/5 (was 4/5) Right Hip External Rotation : 50 (was 50) Right Hip Internal Rotation : 25 (was 20) Right Hip ABduction: 4/5 (was 4/5) Right Hip ADduction: 5/5 (5) Right Knee Flexion: 5/5 (was 4/5) Right Knee Extension: 5/5 Right Ankle Dorsiflexion: 4/5 (was 4/5) Lumbar Strength Lumbar Flexion: 4/5 (was 4/5)  Exercise/Treatments Mobility/Balance      Stretches Active Hamstring Stretch: 3  reps;Limitations;20 seconds Active Hamstring Stretch Limitations: Standing 14" box 3 directinos Hip Flexor Stretch: 3 reps;30 seconds Hip Flexor Stretch Limitations: Standing 14" box hip flexion and groin stretch Piriformis Stretch: 4 reps;Limitations;20 seconds Piriformis Stretch Limitations: seated piriformis Standing Functional Squats: 10 reps;Limitations Functional Squats Limitations: squat reach matrix 3# Forward Lunge: 10 reps;Limitations Forward Lunge Limitations: onto 6 in box  Side Lunge: 10 reps;Limitations Side Lunge Limitations: 6in step Other Standing Lumbar Exercises: SLS Lt 19", Rt 12"    Physical Therapy Assessment and Plan PT Assessment and Plan Clinical Impression Statement: Reassessment complete with the following findings:  Pt independent with HEP and able to demonstrate and verbalize appro[place]raite [place] with all exercises.  Pt with improved hip mobility and improved gait mechanics with and without AD.  Gluteal strength is improving as well.  Improved balance Bil LE with ability to SLS to stand Rt 12" and Lt 19".   PT Plan: Recommend continuing OPPT for 4 more weeks for core and gluteal strengthening, balance and gait without AD.      Goals PT Short Term Goals PT Short Term Goal 1: Increase ankle dorsiflexion to 15 degrees to decrease early heel rise durign gait PT Short Term Goal 1 - Progress: Met PT Short Term Goal 2: Increase hip extension to 15degrees to increase stride length during gait PT Short Term Goal 2 - Progress: Met PT Short Term Goal 3: Increase hamstring mobility to a negative 90/90 hamstring test so patient can bend forward to touch toes without bending back.  PT Short Term Goal 3 - Progress: Met PT Short Term Goal 4: Increase hip internal rotation to 35 degrees to improve shock absorption  at foot strike durign gait and decrease toe out durin gait.  PT Short Term Goal 4 - Progress: Partly met PT Long Term Goals PT Long Term Goal 1: Increase glut  max strength to 4/5 so patient can perform sit to stand  5 times in less than 15 seconds withotu UE support PT Long Term Goal 1 - Progress: Met PT Long Term Goal 2: Increase glut med strength to 4/5 so patient can ambulate ambulate withotu trendelenburg gait PT Long Term Goal 2 - Progress: Partly met Long Term Goal 3: Increase glut max/med strength to 5/5 so patient can ambulate up and down stairs without UE support and withtou an abnormal gait pattern.  Long Term Goal 3 Progress: Progressing toward goal Long Term Goal 4: Pt able to stand SLS for 10 seconds to reduce risk of falling Long Term Goal 4 Progress: Met  Problem List Patient Active Problem List   Diagnosis Date Noted  . DDD (degenerative disc disease), 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/2013  . Difficulty in walking(719.7) 01/25/2013  . CVA (cerebral infarction) 01/22/2013  . Lack of coordination 01/22/2013  . OA (osteoarthritis) of knee 09/05/2011  . Knee pain, right 09/05/2011  . KNEE, ARTHRITIS, DEGEN./OSTEO 01/30/2009  . SHOULDER PAIN 01/16/2009  . KNEE PAIN 01/16/2009  . IMPINGEMENT SYNDROME 01/16/2009  . TENDINITIS, LEFT KNEE 01/16/2009    PT - End of Session Activity Tolerance: Patient tolerated treatment well General Behavior During Therapy: [facility] for tasks assessed/performed  GP    Aldona Lento 11/01/2013, 2:44 PM  Physician Documentation Your signature is required to indicate approval of the treatment plan as stated above.  Please sign and either send electronically or make a copy of this report for your files and return this physician signed original.   Please mark one 1.__approve of plan  2. ___approve of plan with the following conditions.   ______________________________                                                          _____________________ Physician Signature                                                                                                             Date

## 2013-11-02 NOTE — Progress Notes (Signed)
Physical Therapy Re-evaluation  Patient Details  Name: Zachary Murillo. 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  Surgical Date: 06/28/13 Next MD Visit: Anastasia Fiedler September months; Luan Pulling 10/13/2013 Prior Therapy: yes, helpful.   Authorization: BCBS    Authorization Time Period:    Authorization Visit#: 20 of 28   Subjective Symptoms/Limitations Symptoms: Pain minimum today, pt had fun at beach reports compliance with HEP during stay at beach.  Work at [employer] last with a lot of walking How long can you sit comfortably?: Unlimited How long can you stand comfortably?: Able to stand 30 minutes comfortably (was 1mnutes) How long can you walk comfortably?: Walk for 30 minutes, 5,000 steps a day on MiFit with SPC for stability (was Walkier >3049f < 30028fPain Assessment Currently in Pain?: Yes Pain Score: 2  Pain Location: Back Pain Orientation: Lower;Right  Precautions/Restrictions  Precautions Precautions: Back Restrictions Other Position/Activity Restrictions: no bending, lifting, twisting.   Objective: FOTO status 49% was 34%  Assessment RLE AROM (degrees) Right Hip Extension: 18 (was 15) Right Hip Flexion: 103 (was 78) Right Ankle Dorsiflexion: 15 (was 12) RLE Strength Right Hip Flexion: 5/5 Right Hip Extension: 4/5 (was 4/5) Right Hip External Rotation : 50 (was 50) Right Hip Internal Rotation : 25 (was 20) Right Hip ABduction: 4/5 (was 4/5) Right Hip ADduction: 5/5 (5) Right Knee Flexion: 5/5 (was 4/5) Right Knee Extension: 5/5 Right Ankle Dorsiflexion: 4/5 (was 4/5) Lumbar Strength Lumbar Flexion: 4/5 (was 4/5)  Exercise/Treatments Mobility/Balance      Stretches Active Hamstring Stretch: 3  reps;Limitations;20 seconds Active Hamstring Stretch Limitations: Standing 14" box 3 directinos Hip Flexor Stretch: 3 reps;30 seconds Hip Flexor Stretch Limitations: Standing 14" box hip flexion and groin stretch Piriformis Stretch: 4 reps;Limitations;20 seconds Piriformis Stretch Limitations: seated piriformis Standing Functional Squats: 10 reps;Limitations Functional Squats Limitations: squat reach matrix 3# Forward Lunge: 10 reps;Limitations Forward Lunge Limitations: onto 6 in box  Side Lunge: 10 reps;Limitations Side Lunge Limitations: 6in step Other Standing Lumbar Exercises: SLS Lt 19", Rt 12"    Physical Therapy Assessment and Plan PT Assessment and Plan Clinical Impression Statement: Reassessment complete with the following findings:  Pt independent with HEP and able to demonstrate and verbalize appropraite [place] with all exercises.  Pt with improved hip mobility and improved gait mechanics with and without AD.  Gluteal strength is improving as well.  Improved balance Bil LE with ability to SLS to stand Rt 12" and Lt 19".   PT Plan: Recommend continuing OPPT for 4 more weeks for core and gluteal strengthening, balance and gait without AD.      Goals PT Short Term Goals PT Short Term Goal 1: Increase ankle dorsiflexion to 15 degrees to decrease early heel rise durign gait PT Short Term Goal 1 - Progress: Met PT Short Term Goal 2: Increase hip extension to 15degrees to increase stride length during gait PT Short Term Goal 2 - Progress: Met PT Short Term Goal 3: Increase hamstring mobility to a negative 90/90 hamstring test so patient can bend forward to touch toes without bending back.  PT Short Term Goal 3 - Progress: Met PT Short Term Goal 4: Increase hip internal rotation to 35 degrees to improve shock absorption  at foot strike durign gait and decrease toe out durin gait.  PT Short Term Goal 4 - Progress: Partly met PT Long Term Goals PT Long Term Goal 1: Increase glut  max strength to 4/5 so patient can perform sit to stand  5 times in less than 15 seconds withotu UE support PT Long Term Goal 1 - Progress: Met PT Long Term Goal 2: Increase glut med strength to 4/5 so patient can ambulate ambulate withotu trendelenburg gait PT Long Term Goal 2 - Progress: Partly met Long Term Goal 3: Increase glut max/med strength to 5/5 so patient can ambulate up and down stairs without UE support and withtou an abnormal gait pattern.  Long Term Goal 3 Progress: Progressing toward goal Long Term Goal 4: Pt able to stand SLS for 10 seconds to reduce risk of falling Long Term Goal 4 Progress: Met  Problem List Patient Active Problem List   Diagnosis Date Noted  . DDD (degenerative disc disease), 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/2013  . Difficulty in walking(719.7) 01/25/2013  . CVA (cerebral infarction) 01/22/2013  . Lack of coordination 01/22/2013  . OA (osteoarthritis) of knee 09/05/2011  . Knee pain, right 09/05/2011  . KNEE, ARTHRITIS, DEGEN./OSTEO 01/30/2009  . SHOULDER PAIN 01/16/2009  . KNEE PAIN 01/16/2009  . IMPINGEMENT SYNDROME 01/16/2009  . TENDINITIS, LEFT KNEE 01/16/2009    PT - End of Session Activity Tolerance: Patient tolerated treatment well General Behavior During Therapy: [facility] for tasks assessed/performed  GP    Aldona Lento 11/01/2013, 2:44 PM  Devona Konig PT DPT  Physician Documentation Your signature is required to indicate approval of the treatment plan as stated above.  Please sign and either send electronically or make a copy of this report for your files and return this physician signed original.   Please mark one 1.__approve of plan  2. ___approve of plan with the following conditions.   ______________________________                                                          _____________________ Physician Signature                                                                                                             Date

## 2013-11-04 ENCOUNTER — Ambulatory Visit (HOSPITAL_COMMUNITY)
Admission: RE | Admit: 2013-11-04 | Discharge: 2013-11-04 | Disposition: A | Discharge status: Home / Self Care | Payer: BC Managed Care – PPO | Source: Ambulatory Visit | Attending: Pulmonary Disease | Admitting: Pulmonary Disease

## 2013-11-04 DIAGNOSIS — IMO0001 Reserved for inherently not codable concepts without codable children: Secondary | ICD-10-CM | POA: Diagnosis not present

## 2013-11-04 NOTE — Progress Notes (Signed)
Physical Therapy Treatment Patient Details  Name: Zachary Murillo. 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  Surgical Date: 06/28/13 Next MD Visit: Anastasia Fiedler September months; Luan Pulling 10/13/2013 Prior Therapy: yes, helpful.   Authorization: BCBS  Authorization Time Period:    Authorization Visit#: 21 of 28   Subjective: Symptoms/Limitations Symptoms: Notes only minor pain, able to pick stuff up off the floor without falling, but has pain.  Pain Assessment Currently in Pain?: Yes Pain Score: 3  Pain Location: Back Pain Orientation: Lower;Right  Precautions/Restrictions  Precautions Precautions: Back Restrictions Other Position/Activity Restrictions: no bending, lifting, twisting.   Exercise/Treatments Stretches Active Hamstring Stretch: 3 reps;Limitations;20 seconds Active Hamstring Stretch Limitations: Standing 14" box 3 directinos Hip Flexor Stretch: 3 reps;30 seconds Hip Flexor Stretch Limitations: Standing 14" box hip flexion and groin stretch Standing Functional Squats: 10 reps;Limitations Functional Squats Limitations: squat reach matrix 5# 5x Forward Lunge: 10 reps;Limitations Forward Lunge Limitations: onto 6 in box  Side Lunge: 10 reps;Limitations Side Lunge Limitations: 6in step Other Standing Lumbar Exercises:  dumbbell matrix with 1lb dumbbell 5x Other Standing Lumbar Exercises: Sumo walk with red T-band 32f each way Supine Bridge: 10 reps;Limitations Bridge Limitations: Rt leg closer to butt than Lt LE  Manual Therapy Manual Therapy: Other (comment) Other Manual Therapy: MET to correct hip alignment (initially Rt hip anteriorly tilted > Lt) significant pain relief follwoing exercises,     Soft tissue mobilization Rt Lumbar  multifidus and quadratus lumborum.  Physical Therapy Assessment and Plan PT Assessment and Plan Clinical Impression Statement: Conitnued focus on strengtheing for posture, glut strengthening and abdominal strength to decrease strain on back durign gait. Patient stated pain with gait attributed to tendelenberg gait  that significantly improved follwoign MET to correct hip alignement (Rt hip anteriorly tiled > Lt)  PT Plan: continue focus on core and gluteal strengthening, Reassess hip alignement.     Goals PT Short Term Goals PT Short Term Goal 4: Increase hip internal rotation to 35 degrees to improve shock absorption at foot strike durign gait and decrease toe out durin gait.  PT Short Term Goal 4 - Progress: Progressing toward goal PT Long Term Goals PT Long Term Goal 2: Increase glut med strength to 4/5 so patient can ambulate ambulate withotu trendelenburg gait PT Long Term Goal 2 - Progress: Progressing toward goal Long Term Goal 3: Increase glut max/med strength to 5/5 so patient can ambulate up and down stairs without UE support and withtou an abnormal gait pattern.  Long Term Goal 3 Progress: Progressing toward goal  Problem List Patient Active Problem List   Diagnosis Date Noted  . DDD (degenerative disc disease), 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/2013  . Difficulty in walking(719.7) 01/25/2013  . CVA (cerebral infarction) 01/22/2013  . Lack of coordination 01/22/2013  . OA (osteoarthritis) of knee 09/05/2011  . Knee pain, right 09/05/2011  . KNEE, ARTHRITIS, DEGEN./OSTEO 01/30/2009  . SHOULDER PAIN 01/16/2009  . KNEE PAIN 01/16/2009  . IMPINGEMENT SYNDROME 01/16/2009  . TENDINITIS, LEFT KNEE 01/16/2009    PT - End of Session Activity Tolerance: Patient tolerated treatment well General Behavior During Therapy: [facility] tasks assessed/performed  GP    Zachary Murillo R 11/04/2013, 2:34 PM

## 2013-11-08 ENCOUNTER — Ambulatory Visit (HOSPITAL_COMMUNITY)
Admission: RE | Admit: 2013-11-08 | Discharge: 2013-11-08 | Disposition: A | Discharge status: Home / Self Care | Payer: BC Managed Care – PPO | Source: Ambulatory Visit | Attending: Pulmonary Disease | Admitting: Pulmonary Disease

## 2013-11-08 DIAGNOSIS — IMO0001 Reserved for inherently not codable concepts without codable children: Secondary | ICD-10-CM | POA: Diagnosis not present

## 2013-11-08 NOTE — Progress Notes (Signed)
Physical Therapy Treatment Patient Details  Name: Zachary Murillo. 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  Surgical Date: 06/28/13 Next MD Visit: Anastasia Fiedler October months Prior Therapy: yes, helpful.   Authorization: BCBS  Authorization Time Period:    Authorization Visit#: 22 of 28   Subjective: Symptoms/Limitations Symptoms: Pt reported soreness, no real pain today. Pain Assessment Currently in Pain?: No/denies  Precautions/Restrictions  Precautions Precautions: Back Restrictions Other Position/Activity Restrictions: no bending, lifting, twisting.   Exercise/Treatments Stretches Piriformis Stretch: 4 reps;Limitations;20 seconds Piriformis Stretch Limitations: seated piriformis Standing Heel Raises: 20 reps;Limitations Heel Raises Limitations: Toe raises; 1RT heel walking Functional Squats: 10 reps;Limitations Functional Squats Limitations: squat reach matrix 5# 5x Forward Lunge: 10 reps;Limitations Forward Lunge Limitations: onto 4 in box  Side Lunge: 10 reps;Limitations Side Lunge Limitations: 6in step Other Standing Lumbar Exercises: Gastroc stretches with slant board 3x 30" Supine Bridge: 20 reps;Limitations Bridge Limitations: 10 reps on ball; h/s curls with bridge  Manual Therapy Other Manual Therapy: SI within alignment, no MET required  Physical Therapy Assessment and Plan PT Assessment and Plan Clinical Impression Statement: SI within alignment, no MET required today.  Progressed gluteal strengthening with bridges and hamstring curls on theraball to improve core strenghtening as well on dynamic surfaces.  Progressed TA strengthening and gastroc stretches for improved heel to toe pattern gait.  Continued with side lunges for glut med  strengthening to improve overall gait mechanics and reduce tendelenberg gait. No reports of pain through session. PT Plan: continue focus on core and gluteal strengthening, Reassess hip alignement.     Goals PT Short Term Goals PT Short Term Goal 1: Increase ankle dorsiflexion to 15 degrees to decrease early heel rise durign gait PT Short Term Goal 2: Increase hip extension to 15degrees to increase stride length during gait PT Short Term Goal 3: Increase hamstring mobility to a negative 90/90 hamstring test so patient can bend forward to touch toes without bending back.  PT Short Term Goal 4: Increase hip internal rotation to 35 degrees to improve shock absorption at foot strike durign gait and decrease toe out durin gait.  PT Long Term Goals PT Long Term Goal 1: Increase glut max strength to 4/5 so patient can perform sit to stand  5 times in less than 15 seconds withotu UE support PT Long Term Goal 1 - Progress: Progressing toward goal PT Long Term Goal 2: Increase glut med strength to 4/5 so patient can ambulate ambulate withotu trendelenburg gait PT Long Term Goal 2 - Progress: Progressing toward goal Long Term Goal 3: Increase glut max/med strength to 5/5 so patient can ambulate up and down stairs without UE support and withtou an abnormal gait pattern.  Long Term Goal 3 Progress: Progressing toward goal Long Term Goal 4: Pt able to stand SLS for 10 seconds to reduce risk of falling  Problem List Patient Active Problem List   Diagnosis Date Noted  . DDD (degenerative disc disease), 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/2013  . Difficulty in walking(719.7) 01/25/2013  . CVA (cerebral infarction) 01/22/2013  . Lack of coordination 01/22/2013  . OA (osteoarthritis) of knee 09/05/2011  . Knee pain, right 09/05/2011  . KNEE, ARTHRITIS, DEGEN./OSTEO 01/30/2009  . SHOULDER PAIN 01/16/2009  . KNEE PAIN 01/16/2009  . IMPINGEMENT SYNDROME 01/16/2009  . TENDINITIS, LEFT KNEE 01/16/2009  PT - End of  Session Activity Tolerance: Patient tolerated treatment well General Behavior During Therapy: [facility] for tasks assessed/performed  GP    Aldona Lento 11/08/2013, 4:29 PM

## 2013-11-09 ENCOUNTER — Ambulatory Visit (HOSPITAL_COMMUNITY): Payer: BC Managed Care – PPO

## 2013-11-10 ENCOUNTER — Ambulatory Visit (HOSPITAL_COMMUNITY)
Admission: RE | Admit: 2013-11-10 | Discharge: 2013-11-10 | Disposition: A | Discharge status: Home / Self Care | Payer: BC Managed Care – PPO | Source: Ambulatory Visit | Attending: Pulmonary Disease | Admitting: Pulmonary Disease

## 2013-11-10 DIAGNOSIS — R269 Unspecified abnormalities of gait and mobility: Secondary | ICD-10-CM | POA: Insufficient documentation

## 2013-11-10 DIAGNOSIS — M5380 Other specified dorsopathies, site unspecified: Secondary | ICD-10-CM | POA: Diagnosis not present

## 2013-11-10 DIAGNOSIS — M545 Low back pain, unspecified: Secondary | ICD-10-CM | POA: Insufficient documentation

## 2013-11-10 DIAGNOSIS — IMO0001 Reserved for inherently not codable concepts without codable children: Secondary | ICD-10-CM | POA: Diagnosis present

## 2013-11-10 DIAGNOSIS — E119 Type 2 diabetes mellitus without complications: Secondary | ICD-10-CM | POA: Diagnosis not present

## 2013-11-10 DIAGNOSIS — R209 Unspecified disturbances of skin sensation: Secondary | ICD-10-CM | POA: Diagnosis not present

## 2013-11-10 DIAGNOSIS — R262 Difficulty in walking, not elsewhere classified: Secondary | ICD-10-CM | POA: Diagnosis not present

## 2013-11-10 NOTE — Progress Notes (Signed)
Physical Therapy Treatment Patient Details  Name: Zachary Murillo. 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  Surgical Date: 06/28/13 Next MD Visit: Anastasia Fiedler October months Prior Therapy: yes, helpful.   Authorization: BCBS  Authorization Visit#: 23 of 28   Subjective: Symptoms/Limitations Symptoms: Patient reports increased soreness after last session. no pain Pain Assessment Currently in Pain?: No/denies  Precautions/Restrictions  Precautions Precautions: Back Restrictions Other Position/Activity Restrictions: no bending, lifting, twisting.   Exercise/Treatments Stretches Active Hamstring Stretch: 3 reps;Limitations;20 seconds Active Hamstring Stretch Limitations: Standing 14" box 3 directinos Hip Flexor Stretch: 4 reps;10 seconds Hip Flexor Stretch Limitations: Standing 14" box hip flexion and groin stretch Standing Functional Squats Limitations: Squat matrix 5x, Squat walk around 10x Forward Lunge: 10 reps;Limitations Forward Lunge Limitations: onto 2 in box  Side Lunge: 10 reps;Limitations Side Lunge Limitations: 2in step Other Standing Lumbar Exercises: Sumo walk with Nyoka Cowden T-band 78ft each way Supine Bridge: 20 reps;Limitations (single leg 10x each) Bridge Limitations: 10 reps on ball; h/s curls with bridge  Physical Therapy Assessment and Plan PT Assessment and Plan Clinical Impression Statement: Patient displays improving strength with good tolerance for progressed exercises with no increased pain. Patient will benefit from continued progression of exercises as tolerated to increase pain. Patient demosntrated good performance of 8" stairs and difficulty with 12" stairs. PT Plan: continue focus on core and gluteal strengthening, Reassess hip  alignement as needed.     Goals PT Short Term Goals PT Short Term Goal 4: Increase hip internal rotation to 35 degrees to improve shock absorption at foot strike durign gait and decrease toe out durin gait.  PT Short Term Goal 4 - Progress: Progressing toward goal PT Long Term Goals PT Long Term Goal 1: Increase glut max strength to 4/5 so patient can perform sit to stand  5 times in less than 15 seconds withotu UE support PT Long Term Goal 1 - Progress: Progressing toward goal PT Long Term Goal 2: Increase glut med strength to 4/5 so patient can ambulate ambulate withotu trendelenburg gait PT Long Term Goal 2 - Progress: Progressing toward goal Long Term Goal 3: Increase glut max/med strength to 5/5 so patient can ambulate up and down stairs without UE support and withtou an abnormal gait pattern.  Long Term Goal 3 Progress: Progressing toward goal  Problem List Patient Active Problem List   Diagnosis Date Noted  . DDD (degenerative disc disease), 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/2013  . Difficulty in walking(719.7) 01/25/2013  . CVA (cerebral infarction) 01/22/2013  . Lack of coordination 01/22/2013  . OA (osteoarthritis) of knee 09/05/2011  . Knee pain, right 09/05/2011  . KNEE, ARTHRITIS, DEGEN./OSTEO 01/30/2009  . SHOULDER PAIN 01/16/2009  . KNEE PAIN 01/16/2009  . IMPINGEMENT SYNDROME 01/16/2009  . TENDINITIS, LEFT KNEE 01/16/2009    PT - End of Session Activity Tolerance: Patient tolerated treatment well General Behavior During Therapy: [facility] for tasks assessed/performed  GP    Dajai Wahlert R 11/10/2013, 5:18 PM

## 2013-11-11 ENCOUNTER — Ambulatory Visit (HOSPITAL_COMMUNITY): Payer: BC Managed Care – PPO | Admitting: Physical Therapy

## 2013-11-16 ENCOUNTER — Ambulatory Visit (HOSPITAL_COMMUNITY)
Admission: RE | Admit: 2013-11-16 | Discharge: 2013-11-16 | Disposition: A | Discharge status: Home / Self Care | Payer: BC Managed Care – PPO | Source: Ambulatory Visit | Attending: Pulmonary Disease | Admitting: Pulmonary Disease

## 2013-11-16 DIAGNOSIS — IMO0001 Reserved for inherently not codable concepts without codable children: Secondary | ICD-10-CM | POA: Diagnosis not present

## 2013-11-16 NOTE — Progress Notes (Signed)
Physical Therapy Treatment Patient Details  Name: Delmore Sear. 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  Surgical Date: 06/28/13 Next MD Visit: Anastasia Fiedler October months Prior Therapy: yes, helpful.   Authorization: BCBS  Authorization Time Period:    Authorization Visit#: 24 of 28   Subjective: Symptoms/Limitations Symptoms: Pt reports stiffness this morning, no pain today Pain Assessment Currently in Pain?: No/denies  Precautions/Restrictions  Precautions Precautions: Back Restrictions Other Position/Activity Restrictions: no bending, lifting, twisting.   Exercise/Treatments Stretches Active Hamstring Stretch: 3 reps;Limitations;20 seconds Active Hamstring Stretch Limitations: Standing 14" box 3 directinos Hip Flexor Stretch: 4 reps;10 seconds Hip Flexor Stretch Limitations: Standing 14" box hip flexion and groin stretch Standing Functional Squats: 5 reps;Limitations Functional Squats Limitations: 2 squat then walk around box for IR 5 sets each LE Forward Lunge: 10 reps;Limitations Forward Lunge Limitations: onto 2 in box 2 sets of 10 Side Lunge: 10 reps;Limitations Side Lunge Limitations: 2in step 2 sets of 10     Physical Therapy Assessment and Plan PT Assessment and Plan Clinical Impression Statement: Progressed gluteal musculature and hip mobility added squat walk around to improve internal rotation of hips Bil.  Pt progressing well towards improved strength with less therapist facilitation required for proper weight distribution with lunges and squats.   PT Plan: continue focus on core and gluteal strengthening, Reassess hip alignement as needed.     Goals PT Short Term Goals PT Short Term Goal 1: Increase ankle dorsiflexion to 15 degrees to decrease  early heel rise durign gait PT Short Term Goal 2: Increase hip extension to 15degrees to increase stride length during gait PT Short Term Goal 3: Increase hamstring mobility to a negative 90/90 hamstring test so patient can bend forward to touch toes without bending back.  PT Short Term Goal 4: Increase hip internal rotation to 35 degrees to improve shock absorption at foot strike durign gait and decrease toe out durin gait.  PT Short Term Goal 4 - Progress: Progressing toward goal PT Long Term Goals PT Long Term Goal 1: Increase glut max strength to 4/5 so patient can perform sit to stand  5 times in less than 15 seconds withotu UE support PT Long Term Goal 1 - Progress: Progressing toward goal PT Long Term Goal 2: Increase glut med strength to 4/5 so patient can ambulate ambulate withotu trendelenburg gait PT Long Term Goal 2 - Progress: Progressing toward goal Long Term Goal 3: Increase glut max/med strength to 5/5 so patient can ambulate up and down stairs without UE support and withtou an abnormal gait pattern.  Long Term Goal 3 Progress: Progressing toward goal Long Term Goal 4: Pt able to stand SLS for 10 seconds to reduce risk of falling  Problem List Patient Active Problem List   Diagnosis Date Noted  . DDD (degenerative disc disease), 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/2013  . Difficulty in walking(719.7) 01/25/2013  . CVA (cerebral infarction) 01/22/2013  . Lack of coordination 01/22/2013  . OA (osteoarthritis) of knee 09/05/2011  . Knee pain, right 09/05/2011  . KNEE, ARTHRITIS, DEGEN./OSTEO 01/30/2009  . SHOULDER PAIN 01/16/2009  . KNEE PAIN 01/16/2009  . IMPINGEMENT SYNDROME 01/16/2009  . TENDINITIS, LEFT KNEE 01/16/2009    PT - End of Session Activity Tolerance: Patient tolerated treatment well General Behavior During Therapy: [facility] for tasks assessed/performed  GP    Aldona Lento 11/16/2013, 8:44 AM

## 2013-11-19 ENCOUNTER — Ambulatory Visit (HOSPITAL_COMMUNITY)
Admission: RE | Admit: 2013-11-19 | Discharge: 2013-11-19 | Disposition: A | Discharge status: Home / Self Care | Payer: BC Managed Care – PPO | Source: Ambulatory Visit | Attending: Pulmonary Disease | Admitting: Pulmonary Disease

## 2013-11-19 DIAGNOSIS — IMO0001 Reserved for inherently not codable concepts without codable children: Secondary | ICD-10-CM | POA: Diagnosis not present

## 2013-11-19 NOTE — Progress Notes (Signed)
Physical Therapy Treatment Patient Details  Name: Zachary Murillo. 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  Surgical Date: 06/28/13 Next MD Visit: Anastasia Fiedler October months Prior Therapy: yes, helpful.   Authorization: BCBS  Authorization Time Period:    Authorization Visit#: 25 of 28   Subjective: Symptoms/Limitations Symptoms: Pt states he has been doing some of his exercises at home.  Pt walks for 10-15 minutes 3-4 times a day  Pain Assessment Currently in Pain?: No/denies Pain Score: 2  Pain Location: Back Pain Orientation: Right;Lower  Exercise/Treatments Stretches Active Hamstring Stretch: 3 reps;Limitations;20 seconds Active Hamstring Stretch Limitations: Standing 14" box 3 directinos Hip Flexor Stretch: 2 reps;30 seconds Hip Flexor Stretch Limitations: Standing 14" box hip flexion and groin stretch Standing Functional Squats: 5 reps;Limitations Functional Squats Limitations: squat then walk around box for IR 10 reps each LE Forward Lunge: 20 reps Forward Lunge Limitations: onto 2 in box 2 sets of 10 Side Lunge: 10 reps;Limitations Side Lunge Limitations: 2in step 2 sets of 10 Other Standing Lumbar Exercises: Sumo walk with Nyoka Cowden T-band 38ft each way Supine Bridge: 20 reps;Limitations (single leg 10x each) Straight Leg Raise: 20 reps Other Supine Lumbar Exercises: sit up 10x  Physical Therapy Assessment and Plan PT Assessment and Plan Clinical Impression Statement: Patient displays improving strength with decreased but continued trendelenberg gait resulting in pain in pain decreasing though continued. Patient demosntrates improved abdominal strengthe requirign no assistance with bed mobility.  PT Plan: continue focus on core and gluteal strengthening to  improve gait, Reassess hip alignement as needed. introduce walking lunges, and gait exercises to improve stability during gait.     Problem List Patient Active Problem List   Diagnosis Date Noted  . DDD (degenerative disc disease), 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/2013  . Difficulty in walking(719.7) 01/25/2013  . CVA (cerebral infarction) 01/22/2013  . Lack of coordination 01/22/2013  . OA (osteoarthritis) of knee 09/05/2011  . Knee pain, right 09/05/2011  . KNEE, ARTHRITIS, DEGEN./OSTEO 01/30/2009  . SHOULDER PAIN 01/16/2009  . KNEE PAIN 01/16/2009  . IMPINGEMENT SYNDROME 01/16/2009  . TENDINITIS, LEFT KNEE 01/16/2009    PT - End of Session Activity Tolerance: Patient tolerated treatment well General Behavior During Therapy: [facility] for tasks assessed/performed  Zachary Murillo R 11/19/2013, 10:22 AM

## 2013-11-22 ENCOUNTER — Ambulatory Visit (HOSPITAL_COMMUNITY)
Admission: RE | Admit: 2013-11-22 | Discharge: 2013-11-22 | Disposition: A | Discharge status: Home / Self Care | Payer: BC Managed Care – PPO | Source: Ambulatory Visit | Attending: Pulmonary Disease | Admitting: Pulmonary Disease

## 2013-11-22 DIAGNOSIS — IMO0001 Reserved for inherently not codable concepts without codable children: Secondary | ICD-10-CM | POA: Diagnosis not present

## 2013-11-22 NOTE — Progress Notes (Signed)
Physical Therapy Treatment Patient Details  Name: Zachary Murillo. MRN: [id number] Date of Birth: 06/29/1950  Today's Date: 11/22/2013 Time: 1020-1140 PT Time Calculation (min): 80 min Visit#: 26 of 28  Re-eval: 11/29/13 Authorization: BCBS  Authorization Visit#: 26 of 28  Charges:  therex 45 (no charge for nustep at end of session)  Subjective: Symptoms/Limitations Symptoms: PT states he is doing better and getting stronger.  Compliant with HEP.  No pain today. Pain Assessment Currently in Pain?: No/denies    Exercise/Treatments Stretches Active Hamstring Stretch: 3 reps;Limitations;20 seconds Active Hamstring Stretch Limitations: Standing 14" box 3 directinos Hip Flexor Stretch: 2 reps;30 seconds Hip Flexor Stretch Limitations: Standing 14" box hip flexion and groin stretch Aerobic Stationary Bike: nustep 1530 minutes level 5 hills #3 at end of session (no charge) Standing Functional Squats: 5 reps;Limitations Functional Squats Limitations: squat then walk around box for IR 10 reps each LE Forward Lunge: 20 reps Forward Lunge Limitations: onto 2 in box 2 sets of 10 Side Lunge: 10 reps;Limitations Side Lunge Limitations: 2in step 2 sets of 10 Other Standing Lumbar Exercises: progressive lunges 1RT Supine Bridge: 20 reps;Limitations Straight Leg Raise: 20 reps Other Supine Lumbar Exercises: crunches 15reps    Physical Therapy Assessment and Plan PT Assessment and Plan Clinical Impression Statement: Continued improvment in strength and static balance.  Dynamic balance activities continue to be most challenging with patient requiring min-mod assist to complete progressive lunges.  Several incidents of LE's scissoring with actvity.  Completes session with nustep for activity tolerance. PT Plan: continue focus on core and gluteal strengthening to improve gait, Reassess hip alignement as needed. Progress dynamic balance activities.  RE-evaluate X 2 more visits.       Problem List Patient Active Problem List   Diagnosis Date Noted  . DDD (degenerative disc disease), 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/2013  . Difficulty in walking(719.7) 01/25/2013  . CVA (cerebral infarction) 01/22/2013  . Lack of coordination 01/22/2013  . OA (osteoarthritis) of knee 09/05/2011  . Knee pain, right 09/05/2011  . KNEE, ARTHRITIS, DEGEN./OSTEO 01/30/2009  . SHOULDER PAIN 01/16/2009  . KNEE PAIN 01/16/2009  . IMPINGEMENT SYNDROME 01/16/2009  . TENDINITIS, LEFT KNEE 01/16/2009    PT - End of Session Activity Tolerance: Patient tolerated treatment well General Behavior During Therapy: WFL for tasks assessed/performed   Teena Irani, PTA/CLT 11/22/2013, 11:12 AM

## 2013-11-25 ENCOUNTER — Ambulatory Visit (HOSPITAL_COMMUNITY)
Admission: RE | Admit: 2013-11-25 | Discharge: 2013-11-25 | Disposition: A | Discharge status: Home / Self Care | Payer: BC Managed Care – PPO | Source: Ambulatory Visit | Attending: Pulmonary Disease | Admitting: Pulmonary Disease

## 2013-11-25 DIAGNOSIS — IMO0001 Reserved for inherently not codable concepts without codable children: Secondary | ICD-10-CM | POA: Diagnosis not present

## 2013-11-25 NOTE — Progress Notes (Signed)
Physical Therapy Treatment Patient Details  Name: Zachary Murillo. 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  Surgical Date: 06/28/13 Next MD Visit: Anastasia Fiedler October months Prior Therapy: yes, helpful.   Authorization: BCBS  Authorization Time Period:    Authorization Visit#: 27 of 28   Subjective: Symptoms/Limitations Symptoms: Pt stated pain free today, went to MD apt yesterday at [facility], looking at changing meds for BP control.  BP this morning was 153/86 Pain Assessment Currently in Pain?: No/denies  Precautions/Restrictions  Precautions Precautions: Back Restrictions Other Position/Activity Restrictions: no bending, lifting, twisting.   Exercise/Treatments Stretches Active Hamstring Stretch: 3 reps;Limitations;20 seconds Active Hamstring Stretch Limitations: Standing 14" box 3 directinos Hip Flexor Stretch: 2 reps;30 seconds Hip Flexor Stretch Limitations: Standing 14" box hip flexion and groin stretch Piriformis Stretch: 4 reps;Limitations;20 seconds Piriformis Stretch Limitations: seated piriformis Standing Functional Squats: 10 reps;Limitations Functional Squats Limitations: squat then walk around box for IR 10 reps each LE Forward Lunge: 20 reps Forward Lunge Limitations: onto 2 in box 2 sets of 10 Side Lunge: 10 reps;Limitations Side Lunge Limitations: 2in step 2 sets of 10 Other Standing Lumbar Exercises: transverse step up on 6in Bil LE 10x    Physical Therapy Assessment and Plan PT Assessment and Plan Clinical Impression Statement: Continued session focus on gluteal and core strengthening with therapist facilitation to improve form due to glut med weakness.  Pt with increased scissoring gait with activities.  Added transverse step up for glut and  core strengthening.  No reports of pain through session. PT Plan: Re-eval next session.    Goals PT Short Term Goals PT Short Term Goal 1: Increase ankle dorsiflexion to 15 degrees to decrease early heel rise durign gait PT Short Term Goal 2: Increase hip extension to 15degrees to increase stride length during gait PT Short Term Goal 3: Increase hamstring mobility to a negative 90/90 hamstring test so patient can bend forward to touch toes without bending back.  PT Short Term Goal 4: Increase hip internal rotation to 35 degrees to improve shock absorption at foot strike durign gait and decrease toe out durin gait.  PT Short Term Goal 4 - Progress: Progressing toward goal PT Long Term Goals PT Long Term Goal 1: Increase glut max strength to 4/5 so patient can perform sit to stand  5 times in less than 15 seconds withotu UE support PT Long Term Goal 1 - Progress: Progressing toward goal PT Long Term Goal 2: Increase glut med strength to 4/5 so patient can ambulate ambulate withotu trendelenburg gait PT Long Term Goal 2 - Progress: Progressing toward goal Long Term Goal 3: Increase glut max/med strength to 5/5 so patient can ambulate up and down stairs without UE support and withtou an abnormal gait pattern.  Long Term Goal 4: Pt able to stand SLS for 10 seconds to reduce risk of falling  Problem List Patient Active Problem List   Diagnosis Date Noted  . DDD (degenerative disc disease), 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/2013  . Difficulty in walking(719.7) 01/25/2013  . CVA (cerebral infarction) 01/22/2013  . Lack of coordination 01/22/2013  . OA (osteoarthritis) of knee 09/05/2011  . Knee pain, right 09/05/2011  . KNEE, ARTHRITIS, DEGEN./OSTEO 01/30/2009  . SHOULDER PAIN 01/16/2009  . KNEE PAIN 01/16/2009  . IMPINGEMENT SYNDROME 01/16/2009  . TENDINITIS, LEFT KNEE  01/16/2009    PT - End of Session Activity Tolerance: Patient tolerated treatment well General Behavior During Therapy: [facility] for tasks  assessed/performed  GP    Aldona Lento 11/25/2013, 3:36 PM

## 2013-11-29 ENCOUNTER — Ambulatory Visit (HOSPITAL_COMMUNITY)
Admission: RE | Admit: 2013-11-29 | Discharge: 2013-11-29 | Disposition: A | Discharge status: Home / Self Care | Payer: BC Managed Care – PPO | Source: Ambulatory Visit | Attending: Pulmonary Disease | Admitting: Pulmonary Disease

## 2013-11-29 DIAGNOSIS — IMO0001 Reserved for inherently not codable concepts without codable children: Secondary | ICD-10-CM | POA: Diagnosis not present

## 2013-11-29 NOTE — Progress Notes (Signed)
Physical Therapy Re-evaluation/Treatment Note/Discharge summary  Patient Details  Name: Zachary Murillo. 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  Surgical Date: 06/28/13 Next MD Visit: Anastasia Fiedler October months Prior Therapy: yes, helpful.   Authorization: BCBS    Authorization Time Period:    Authorization Visit#: 28 of 28   Subjective Symptoms/Limitations Symptoms: Pt went to MD earlier today and reports BP, sugar levels are good and has lost 5 more pounds.   How long can you sit comfortably?: Unlimited How long can you stand comfortably?: Able to stand 45 minutes to an hour comfortably (was 30 minutes) How long can you walk comfortably?: Average 6-7,000 steps a day with MiFit (was Walk for 30 minutes, 5,000 steps a day on MiFit with SPC for stability)  Precautions/Restrictions  Precautions Precautions: Back Restrictions Other Position/Activity Restrictions: no bending, lifting, twisting.   Assessment RLE AROM (degrees) Right Hip Extension: 20 (was 18) Right Hip Flexion: 115 (was 103) Right Ankle Dorsiflexion: 25 (was 15) RLE Strength Right Hip Flexion: 5/5 Right Hip Extension: 4/5 (was 4/5) Right Hip External Rotation : 60 (was 50) Right Hip Internal Rotation : 32 (was 25) Right Hip ABduction: 5/5 (was 4/5) Right Hip ADduction: 5/5 Right Knee Flexion: 5/5 Right Knee Extension: 5/5 Right Ankle Dorsiflexion:  (was 4/5) Lumbar Strength Lumbar Flexion:  (was 4/5) FOTO 59% was 48%  Exercise/Treatments Stretches Active Hamstring Stretch: 3 reps;Limitations;20 seconds Active Hamstring Stretch Limitations: Standing 14" box 3 directinos Hip Flexor Stretch: 2 reps;30 seconds Hip Flexor Stretch Limitations: Standing  14" box hip flexion and groin stretch Piriformis Stretch: 4 reps;Limitations;20 seconds Piriformis Stretch Limitations: seated piriformis Standing Functional Squats: 10 reps;Limitations Functional Squats Limitations: squat matrix (7 positions) Forward Lunge: 20 reps Forward Lunge Limitations: onto 2 in box 2 sets of 10 Side Lunge: 10 reps;Limitations Side Lunge Limitations: 2in step 2 sets of 10 Other Standing Lumbar Exercises: transverse step up on 6in Bil LE 10x    Physical Therapy Assessment and Plan PT Assessment and Plan Clinical Impression Statement: Reassessment complete with the following findings:  Pt compliant with HEP and able to demonstrate or verbalize appropriate technique with exercises.  Improved hip mobility and gluteal strength for increased ease with functional abilities.  Pt able to ascend and descending stairs reciprocally with 1 handrail going down and no assistance required for ascending.  Pt able to complete 5 STS in 12" with no HHA.  Recommend discharge to HEP and attending at the gym pt and wife have joined.   PT Plan: D/C to HEP.      Goals Home Exercise Program PT Goal: Perform Home Exercise Program - Progress: Met PT Short Term Goals PT Short Term Goal 1: Increase ankle dorsiflexion to 15 degrees to decrease early heel rise durign gait PT Short Term Goal 1 - Progress: Met PT Short Term Goal 2: Increase hip extension to 15degrees to increase stride length during gait PT Short Term Goal 2 - Progress: Met PT Short Term Goal 3: Increase hamstring mobility to a negative 90/90 hamstring test so patient can bend forward to touch toes without bending back.  PT Short Term Goal 3 - Progress: Met PT Short Term Goal 4: Increase hip internal rotation to 35  degrees to improve shock absorption at foot strike durign gait and decrease toe out durin gait.  PT Short Term Goal 4 - Progress: Progressing toward goal PT Long Term Goals PT Long Term Goal 1: Increase glut max  strength to 4/5 so patient can perform sit to stand  5 times in less than 15 seconds withotu UE support (12 seconds) PT Long Term Goal 1 - Progress: Met PT Long Term Goal 2: Increase glut med strength to 4/5 so patient can ambulate ambulate withotu trendelenburg gait PT Long Term Goal 2 - Progress: Met Long Term Goal 3: Increase glut max/med strength to 5/5 so patient can ambulate up and down stairs without UE support and withtou an abnormal gait pattern.  (ascend reciprocally no HHA, descend with 1HR) Long Term Goal 3 Progress: Partly met Long Term Goal 4: Pt able to stand SLS for 10 seconds to reduce risk of falling Long Term Goal 4 Progress: Met  Problem List Patient Active Problem List   Diagnosis Date Noted  . DDD (degenerative disc disease), 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/2013  . Difficulty in walking(719.7) 01/25/2013  . CVA (cerebral infarction) 01/22/2013  . Lack of coordination 01/22/2013  . OA (osteoarthritis) of knee 09/05/2011  . Knee pain, right 09/05/2011  . KNEE, ARTHRITIS, DEGEN./OSTEO 01/30/2009  . SHOULDER PAIN 01/16/2009  . KNEE PAIN 01/16/2009  . IMPINGEMENT SYNDROME 01/16/2009  . TENDINITIS, LEFT KNEE 01/16/2009    PT - End of Session Activity Tolerance: Patient tolerated treatment well General Behavior During Therapy: [facility] for tasks assessed/performed  GP    Aldona Lento 11/29/2013, 4:43 PM  Physician Documentation Your signature is required to indicate approval of the treatment plan as stated above.  Please sign and either send electronically or make a copy of this report for your files and return this physician signed original.   Please mark one 1.__approve of plan  2. ___approve of plan with the following conditions.   ______________________________                                                          _____________________ Physician Signature                                                                                                              Date

## 2013-11-30 NOTE — Progress Notes (Signed)
Physical Therapy Re-evaluation/Treatment Note/Discharge summary  Patient Details  Name: Zachary Murillo. 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  Surgical Date: 06/28/13 Next MD Visit: Anastasia Fiedler October months Prior Therapy: yes, helpful.   Authorization: BCBS    Authorization Time Period:    Authorization Visit#: 28 of 28   Subjective Symptoms/Limitations Symptoms: Pt went to MD earlier today and reports BP, sugar levels are good and has lost 5 more pounds.   How long can you sit comfortably?: Unlimited How long can you stand comfortably?: Able to stand 45 minutes to an hour comfortably (was 30 minutes) How long can you walk comfortably?: Average 6-7,000 steps a day with MiFit (was Walk for 30 minutes, 5,000 steps a day on MiFit with SPC for stability)  Precautions/Restrictions  Precautions Precautions: Back Restrictions Other Position/Activity Restrictions: no bending, lifting, twisting.   Assessment RLE AROM (degrees) Right Hip Extension: 20 (was 18) Right Hip Flexion: 115 (was 103) Right Ankle Dorsiflexion: 25 (was 15) RLE Strength Right Hip Flexion: 5/5 Right Hip Extension: 4/5 (was 4/5) Right Hip External Rotation : 60 (was 50) Right Hip Internal Rotation : 32 (was 25) Right Hip ABduction: 5/5 (was 4/5) Right Hip ADduction: 5/5 Right Knee Flexion: 5/5 Right Knee Extension: 5/5 Right Ankle Dorsiflexion:  (was 4/5) Lumbar Strength Lumbar Flexion:  (was 4/5) FOTO 59% was 48%  Exercise/Treatments Stretches Active Hamstring Stretch: 3 reps;Limitations;20 seconds Active Hamstring Stretch Limitations: Standing 14" box 3 directinos Hip Flexor Stretch: 2 reps;30 seconds Hip Flexor Stretch Limitations: Standing  14" box hip flexion and groin stretch Piriformis Stretch: 4 reps;Limitations;20 seconds Piriformis Stretch Limitations: seated piriformis Standing Functional Squats: 10 reps;Limitations Functional Squats Limitations: squat matrix (7 positions) Forward Lunge: 20 reps Forward Lunge Limitations: onto 2 in box 2 sets of 10 Side Lunge: 10 reps;Limitations Side Lunge Limitations: 2in step 2 sets of 10 Other Standing Lumbar Exercises: transverse step up on 6in Bil LE 10x    Physical Therapy Assessment and Plan PT Assessment and Plan Clinical Impression Statement: Reassessment complete with the following findings:  Pt compliant with HEP and able to demonstrate or verbalize appropriate technique with exercises.  Improved hip mobility and gluteal strength for increased ease with functional abilities.  Pt able to ascend and descending stairs reciprocally with 1 handrail going down and no assistance required for ascending.  Pt able to complete 5 STS in 12" with no HHA.  Recommend discharge to HEP and attending at the gym pt and wife have joined.   PT Plan: D/C to HEP.      Goals Home Exercise Program PT Goal: Perform Home Exercise Program - Progress: Met PT Short Term Goals PT Short Term Goal 1: Increase ankle dorsiflexion to 15 degrees to decrease early heel rise durign gait PT Short Term Goal 1 - Progress: Met PT Short Term Goal 2: Increase hip extension to 15degrees to increase stride length during gait PT Short Term Goal 2 - Progress: Met PT Short Term Goal 3: Increase hamstring mobility to a negative 90/90 hamstring test so patient can bend forward to touch toes without bending back.  PT Short Term Goal 3 - Progress: Met PT Short Term Goal 4: Increase hip internal rotation to 35  degrees to improve shock absorption at foot strike durign gait and decrease toe out durin gait.  PT Short Term Goal 4 - Progress: Progressing toward goal PT Long Term Goals PT Long Term Goal 1: Increase glut max  strength to 4/5 so patient can perform sit to stand  5 times in less than 15 seconds withotu UE support (12 seconds) PT Long Term Goal 1 - Progress: Met PT Long Term Goal 2: Increase glut med strength to 4/5 so patient can ambulate ambulate withotu trendelenburg gait PT Long Term Goal 2 - Progress: Met Long Term Goal 3: Increase glut max/med strength to 5/5 so patient can ambulate up and down stairs without UE support and withtou an abnormal gait pattern.  (ascend reciprocally no HHA, descend with 1HR) Long Term Goal 3 Progress: Partly met Long Term Goal 4: Pt able to stand SLS for 10 seconds to reduce risk of falling Long Term Goal 4 Progress: Met  Problem List Patient Active Problem List   Diagnosis Date Noted  . DDD (degenerative disc disease), 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/2013  . Difficulty in walking(719.7) 01/25/2013  . CVA (cerebral infarction) 01/22/2013  . Lack of coordination 01/22/2013  . OA (osteoarthritis) of knee 09/05/2011  . Knee pain, right 09/05/2011  . KNEE, ARTHRITIS, DEGEN./OSTEO 01/30/2009  . SHOULDER PAIN 01/16/2009  . KNEE PAIN 01/16/2009  . IMPINGEMENT SYNDROME 01/16/2009  . TENDINITIS, LEFT KNEE 01/16/2009    PT - End of Session Activity Tolerance: Patient tolerated treatment well General Behavior During Therapy: [facility] for tasks assessed/performed  GP    Aldona Lento 11/29/2013, 4:43 PM  Devona Konig PT DPT  Physician Documentation Your signature is required to indicate approval of the treatment plan as stated above.  Please sign and either send electronically or make a copy of this report for your files and return this physician signed original.   Please mark one 1.__approve of plan  2. ___approve of plan with the following conditions.   ______________________________                                                          _____________________ Physician Signature                                                                                                              Date

## 2013-12-02 ENCOUNTER — Ambulatory Visit (HOSPITAL_COMMUNITY): Payer: BC Managed Care – PPO

## 2013-12-06 ENCOUNTER — Ambulatory Visit (HOSPITAL_COMMUNITY): Payer: BC Managed Care – PPO

## 2013-12-08 ENCOUNTER — Ambulatory Visit (HOSPITAL_COMMUNITY): Payer: BC Managed Care – PPO | Admitting: Physical Therapy

## 2013-12-15 ENCOUNTER — Encounter: Payer: Self-pay | Admitting: Nutrition

## 2013-12-15 ENCOUNTER — Encounter: Payer: BC Managed Care – PPO | Attending: "Endocrinology | Admitting: Nutrition

## 2013-12-15 VITALS — Ht 71.0 in | Wt 285.6 lb

## 2013-12-15 DIAGNOSIS — E118 Type 2 diabetes mellitus with unspecified complications: Secondary | ICD-10-CM | POA: Diagnosis not present

## 2013-12-15 DIAGNOSIS — IMO0002 Reserved for concepts with insufficient information to code with codable children: Secondary | ICD-10-CM

## 2013-12-15 DIAGNOSIS — E1165 Type 2 diabetes mellitus with hyperglycemia: Secondary | ICD-10-CM

## 2013-12-15 DIAGNOSIS — Z713 Dietary counseling and surveillance: Secondary | ICD-10-CM | POA: Insufficient documentation

## 2013-12-15 NOTE — Patient Instructions (Signed)
Plan:  Aim for 3-4 Carb Choices per meal (45-60 grams) +/- 1 either way  Aim for 0-1 Carbs per snack if hungry  Include protein in moderation with your meals and snacks Consider reading food labels for Total Carbohydrate and Fat Grams of foods Consider  increasing your activity level by 15 for 30 minutes daily as tolerated Consider checking BG at alternate times before and 2 hours after largest meal of the day a few times per week. Continue taking medication  as directed by MD  Goal: 1. Lose 1 lb per week 2. Follow Plate Method and count carbs 3. Get A1C to 6.5% in three months. 4. Keep a food journal and BS log and bring at each visit.

## 2013-12-15 NOTE — Progress Notes (Signed)
  Medical Nutrition Therapy:  Appt start time: 1030 end time:  1130.   Assessment:  Primary concerns today: diabetes. Wife does the shopping. Recently had back surgery. Taking Victoza and Metformin 1000 mg twice a day. Typically eating three meals per day. Testing blood sugars in the am. FBS 132-176 mg/dl.  2 hr after meals have been. 180-200's mg/dl. Most most recent A1C was 7%.  Preferred Learning Style:   Visual  Hands on   Learning Readiness:   Not ready  Contemplating  Ready  Change in progress   MEDICATIONS: see list   DIETARY INTAKE:  24-hr recall:  B ( AM): 2 eggs boiled, 1 slice 40 cal ww toast, grits or oatmeal; 1-2 slices of [place] bacon, or cereal of rice crispiest 1 cup and fruit with coffee and water/crystal light Snk ( AM):  none L ( PM): Meat grilled or baked, vegetables, and a starch, water,  Snk ( PM): fruit or nuts D ( PM): meat and vegetables, water; Uses 45 cal ww toast  Snk ( PM): fruit Beverages: water, crystal light  Usual physical activity: liimited due to back surgery recently  Estimated energy needs: 1800 calories 200 g carbohydrates 135 g protein 50 g fat  Progress Towards Goal(s):  In progress.   Nutritional Diagnosis:  NB-1.1 Food and nutrition-related knowledge deficit As related to diabets.  As evidenced by A1C of 7%.    Intervention:  Nutrition couinseling and diabetes education. Plan:  Aim for 3-4 Carb Choices per meal (45-60 grams) +/- 1 either way  Aim for 0-1 Carbs per snack if hungry  Include protein in moderation with your meals and snacks Consider reading food labels for Total Carbohydrate and Fat Grams of foods Consider  increasing your activity level by 15 for 30 minutes daily as tolerated Consider checking BG at alternate times before and 2 hours after largest meal of the day a few times per week. Continue taking medication  as directed by MD  Goal: 1. Lose 1 lb per week 2. Follow Plate Method and count carbs 3.  Get A1C to 6.5% in three months. 4. Keep a food journal and BS log and bring at each visit.  Teaching Method Utilized:  Visual Auditory Hands on  Handouts given during visit include: Carb Counting and Food Label handouts Meal Plan Card  My Plate  Barriers to learning/adherence to lifestyle change: none  Demonstrated degree of understanding via:  Teach Back   Monitoring/Evaluation:  Dietary intake, exercise, SBG, and body weight in 1 month(s).

## 2014-01-19 ENCOUNTER — Encounter: Payer: Self-pay | Admitting: Nutrition

## 2014-01-19 ENCOUNTER — Encounter: Payer: BC Managed Care – PPO | Attending: "Endocrinology | Admitting: Nutrition

## 2014-01-19 VITALS — Ht 71.0 in | Wt 265.0 lb

## 2014-01-19 DIAGNOSIS — E1165 Type 2 diabetes mellitus with hyperglycemia: Secondary | ICD-10-CM

## 2014-01-19 DIAGNOSIS — Z713 Dietary counseling and surveillance: Secondary | ICD-10-CM | POA: Diagnosis not present

## 2014-01-19 DIAGNOSIS — IMO0002 Reserved for concepts with insufficient information to code with codable children: Secondary | ICD-10-CM

## 2014-01-19 DIAGNOSIS — E118 Type 2 diabetes mellitus with unspecified complications: Secondary | ICD-10-CM | POA: Insufficient documentation

## 2014-01-19 NOTE — Patient Instructions (Signed)
Plan:  Aim for 3-4 Carb Choices per meal (45-60 grams) +/- 1 either way  Avoid snacks unless having a low blood sugar.  Include protein in moderation with your meals and snacks Consider reading food labels for Total Carbohydrate and Fat Grams of foods Continue water exercises daily. Continue checking BG at alternate times before and 2 hours after largest meal of the day a few times per week. Continue taking medication  as directed by MD Throw Novolog/Humalog insulin away if it has been open over 30 days.  Goal: 1. Lose 1-2 lbs per week  2. Follow Plate Method and count carbs 3. Get A1C to 6.5% in three months. 4. Keep a food journal and BS log and bring at each visit.

## 2014-01-19 NOTE — Progress Notes (Signed)
  Medical Nutrition Therapy:  Appt start time: 1100 end time:  1130.  Assessment:  Primary concerns today: DIabetes. Here with his wife. Lost 20 lbs since last vist. Has lost 78 lbs overall in the last 2 years.  FBS 126-166   Blood sugars are 150-90's most of the time otherwise. BS much improved. Diet recall reveals he is eating 45-60 g of carb per meal. Very balanced meals. Now doing exercises in the pool most days of the week. Sometimes feels week after working out. Not hungry much between meals. No low blood sugars.   Preferred Learning Style:   Visual  Hands on   Learning Readiness:   Not ready  Contemplating  Ready  Change in progress  MEDICATIONS: see list   DIETARY INTAKE:  24-hr recall:   He is eating three better balanced meals. Foods are higher in fiber, lower in fat. He denies being hungry or any real symptoms of low blood sugars. Feels satisfied.  Diet is higher in fiber and better CHO balance throughout the day. Blood sugars look much better. Beverages: water, crystal light   Usual physical activity: liimited due to back surgery recently  Estimated energy needs: 1800 calories 200 g carbohydrates 135 g protein 50 g fat  Progress Towards Goal(s):  In progress.   Nutritional Diagnosis:  NB-1.1 Food and nutrition-related knowledge deficit As related to diabets.  As evidenced by A1C of 7%.    Intervention:  Nutrition couinseling and diabetes education. Plan:  Aim for 3-4 Carb Choices per meal (45-60 grams) +/- 1 either way  Avoid snacks unless having a low blood sugar.  Include protein in moderation with your meals and snacks Consider reading food labels for Total Carbohydrate and Fat Grams of foods Continue water exercises daily. Continue checking BG at alternate times before and 2 hours after largest meal of the day a few times per week. Continue taking medication  as directed by MD Throw Novolog/Humalog insulin away if it has been open over 30  days.  Goal: 1. Lose 1-2 lbs per week  2. Follow Plate Method and count carbs 3. Get A1C to 6.5% in three months. 4. Keep a food journal and BS log and bring at each visit.  Teaching Method Utilized:  Visual Auditory Hands on  Handouts given during visit include: Carb Counting and Food Label handouts Meal Plan Card  My Plate  Barriers to learning/adherence to lifestyle change: none  Demonstrated degree of understanding via:  Teach Back   Monitoring/Evaluation:  Dietary intake, exercise, SBG, and body weight in 1 month(s).

## 2014-01-21 ENCOUNTER — Encounter (HOSPITAL_COMMUNITY): Payer: Self-pay

## 2014-01-26 DIAGNOSIS — E876 Hypokalemia: Secondary | ICD-10-CM | POA: Insufficient documentation

## 2014-01-27 ENCOUNTER — Other Ambulatory Visit: Payer: Self-pay | Admitting: *Deleted

## 2014-01-27 ENCOUNTER — Telehealth: Payer: Self-pay | Admitting: Orthopedic Surgery

## 2014-01-27 DIAGNOSIS — M75100 Unspecified rotator cuff tear or rupture of unspecified shoulder, not specified as traumatic: Secondary | ICD-10-CM

## 2014-01-27 NOTE — Telephone Encounter (Signed)
ORDER PRINTED, CALLED PATIENT, NO ANSWER

## 2014-01-27 NOTE — Telephone Encounter (Signed)
Send  OT shoulder pain rotator cuff disease   3 x a week x 6 weeks

## 2014-01-27 NOTE — Telephone Encounter (Signed)
Routing to Dr Harrison 

## 2014-01-27 NOTE — Telephone Encounter (Signed)
Patient/wife called, following speaking with Dr Aline Brochure outside of office on 01/23/14, regarding request for physical therapy for right shoulder, at [facility] -- requesting orders to start therapy as discussed.  Patient Phone# [phone].

## 2014-01-28 NOTE — Telephone Encounter (Signed)
Order faxed to [facility] out-patient therapy. Reached patient/notified.

## 2014-02-01 ENCOUNTER — Ambulatory Visit (HOSPITAL_COMMUNITY)
Admission: RE | Admit: 2014-02-01 | Discharge: 2014-02-01 | Disposition: A | Discharge status: Home / Self Care | Payer: BC Managed Care – PPO | Source: Ambulatory Visit | Attending: Pulmonary Disease | Admitting: Pulmonary Disease

## 2014-02-01 ENCOUNTER — Encounter (HOSPITAL_COMMUNITY): Payer: Self-pay

## 2014-02-01 DIAGNOSIS — I10 Essential (primary) hypertension: Secondary | ICD-10-CM | POA: Diagnosis not present

## 2014-02-01 DIAGNOSIS — M25611 Stiffness of right shoulder, not elsewhere classified: Secondary | ICD-10-CM

## 2014-02-01 DIAGNOSIS — Z5189 Encounter for other specified aftercare: Secondary | ICD-10-CM | POA: Diagnosis present

## 2014-02-01 DIAGNOSIS — E119 Type 2 diabetes mellitus without complications: Secondary | ICD-10-CM | POA: Insufficient documentation

## 2014-02-01 DIAGNOSIS — M25511 Pain in right shoulder: Secondary | ICD-10-CM | POA: Diagnosis not present

## 2014-02-01 NOTE — Patient Instructions (Signed)
Smoothly! Using towel/washcloth to slide easily across table.  SHOULDER: Flexion On Table   Place hands on table, elbows straight. Move hips away from body. Press hands down into table. 1-2 minutes at a time, 2-3 times per day.   Abduction (Passive)   With arm out to side, resting on table, lower head toward arm, keeping trunk away from table.  1-2 minutes at a time, 2-3 times per day. Copyright  VHI. All rights reserved.     Internal Rotation (Assistive)   Seated with elbow bent at right angle and held against side, slide arm on table surface in an inward arc.  1-2 minutes at a time, 2-3 times per day. Activity: Use this motion to brush crumbs off the table.  Copyright  VHI. All rights reserved.    Bea Graff Eden Rho, MS, OTR/L [facility]

## 2014-02-01 NOTE — Therapy (Signed)
Occupational Therapy Evaluation  Patient Details  Name: Zachary Murillo. MRN: [id number] Date of Birth: 06/16/1950  Encounter Date: 02/01/2014      OT End of Session - 02/01/14 1656    Visit Number 1   Number of Visits 18   Date for OT Re-Evaluation 03/01/14   Authorization Type BCBS   OT Start Time 1410   OT Stop Time 1440   OT Time Calculation (min) 30 min   Activity Tolerance Patient tolerated treatment well   Behavior During Therapy WFL for tasks assessed/performed      Past Medical History  Diagnosis Date  . Diabetes mellitus   . Sleep apnea   . Overweight(278.02)   . Hypertension   . Stroke     Past Surgical History  Procedure Laterality Date  . Knee arthroscopy    . Nasal septum surgery    . Cardiac catheterization    . Back surgery      There were no vitals taken for this visit.  Visit Diagnosis:  Pain in joint, shoulder region, right  Decreased range of motion of shoulder, right      Subjective Assessment - 02/01/14 1705    Symptoms "It just hurts when i try to lift it"   Pertinent History pt is presenting to outpatient OT with right rotator cuff syndrome. pt reports that initial pain began in spring 2015, but other medical concerns prevents him from seeking treatment. an MRI revealed that pt may require a shoulder replacement, but pt wishes to try therapy prior to surgical intervention.   Special Tests FOTO 50/100   Patient Stated Goals "get the range back"   Currently in Pain? No/denies  in motion 8/10          [facility] OT Assessment - 02/01/14 1646    Assessment   Diagnosis right rotator cuff syndrome   Onset Date --  spring 2015   Prior Therapy OT and PT for CVA, back, knee   Precautions   Precautions None   Restrictions   Weight Bearing Restrictions No   Balance Screen   Has the patient fallen in the past 6 months Yes   How many times? 1   Has the patient had a decrease in activity level because of a fear of falling?  No   Is the  patient reluctant to leave their home because of a fear of falling?  No   Home  Environment   Family/patient expects to be discharged to: Private residence   Prior Function   Level of Independence Independent with basic ADLs;Independent with homemaking with ambulation   Vocation Retired;Part time employment   Vocation Requirements driver's ed teacher   Leisure sports, attending sporting events, gong to the gym   ADL   ADL comments pt is dropping objects and having difficulty reaching to the back of his head to wash his hair   Written Expression   Dominant Hand Right   Cognition   Overall Cognitive Status Within Functional Limits for tasks assessed   Observation/Other Assessments   Observations Pt has moderste fascial restrictions in his RUE bicep, upper trap, and scap regions   Focus on Therapeutic Outcomes (FOTO)  FOTO 50/100   Sensation   Light Touch Appears Intact   Coordination   Gross Motor Movements are Fluid and Coordinated Yes   Fine Motor Movements are Fluid and Coordinated Yes   AROM   Overall AROM Comments Assessed in sitting, with ER/IR adducted   Right Shoulder Flexion  83 Degrees   Right Shoulder ABduction 79 Degrees   Right Shoulder Internal Rotation 88 Degrees   Right Shoulder External Rotation 40 Degrees   PROM   Overall PROM Comments Assess in supine, with ER/IR adducted   Right Shoulder Flexion 137 Degrees   Right Shoulder ABduction 99 Degrees   Right Shoulder Internal Rotation 90 Degrees   Right Shoulder External Rotation 26 Degrees   Strength   Overall Strength Comments overall RUE shoulder strength 3-/5   Grip (lbs) R elbow flexed 69, elbow extended 62#   Grip (lbs) Left - elbow extended/flexed 97#            OT Education - 02/01/14 1655    Education provided Yes   Education Details Table slides   Person(s) Educated Patient   Methods Explanation;Demonstration   Comprehension Verbalized understanding;Returned demonstration          OT Short  Term Goals - 02/01/14 1702    OT SHORT TERM GOAL #1   Title pt will be educated on HEP   Time 3   Period Weeks   Status New   OT SHORT TERM GOAL #2   Title Pt will imporve PROM in RUE to [facility] for imporved abiltiy to reach back of head   Time 3   Period Weeks   Status New   OT SHORT TERM GOAL #3   Title Pt will decrease pain with reaching tasks to less than 5/10   Time 3   Period Weeks   Status New   OT SHORT TERM GOAL #4   Title Pt will increase RUE strength to 3+/5 for improved ability to reach into cabinets   Time 3   Period Weeks   Status New   OT SHORT TERM GOAL #5   Title Pt will decrase fascail restrictions to minimal in [place] for decrease pain with motion   Time 3   Period Weeks   Status New            Plan - 02/01/14 1657    Clinical Impression Statement pt is presenting to outpatient OT with increased pain with motion, increased fascial restrictions, decreae range of motion, decreased strength, and decreased overall RUE functional use.    OT Frequency 3x / week   OT Duration 6 weeks   OT Treatment/Interventions Self-care/ADL training;Patient/family education;Therapeutic exercise;Manual Therapy;Therapeutic exercises;Therapeutic activities;Passive range of motion;Electrical Stimulation;Ultrasound   Plan pt will benefit from skilled OT services to decrease pain with motion, improve range, increase strength, and improve overall RUE functional use.    Treatment Plan: myofascial release (MFR) and manual stretching, PROM, AAROM, AROM, scapualr strengthening, proximal shoulder strengthening, general RUE strenghtening, and functional reaching tasks   OT Home Exercise Plan 11/24 table slides   Consulted and Agree with Plan of Care Patient        Problem List Patient Active Problem List   Diagnosis Date Noted  . DDD (degenerative disc disease), 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/2013  . Difficulty in walking(719.7) 01/25/2013  . CVA (cerebral infarction) 01/22/2013  . Lack of  coordination 01/22/2013  . OA (osteoarthritis) of knee 09/05/2011  . Knee pain, right 09/05/2011  . KNEE, ARTHRITIS, DEGEN./OSTEO 01/30/2009  . SHOULDER PAIN 01/16/2009  . KNEE PAIN 01/16/2009  . IMPINGEMENT SYNDROME 01/16/2009  . TENDINITIS, LEFT KNEE 01/16/2009     Bea Graff Deke Tilghman, MS, OTR/L [place] [phone] 02/01/2014, 5:13 PM

## 2014-02-02 ENCOUNTER — Ambulatory Visit (HOSPITAL_COMMUNITY): Payer: BC Managed Care – PPO

## 2014-02-07 ENCOUNTER — Ambulatory Visit (HOSPITAL_COMMUNITY)
Admission: RE | Admit: 2014-02-07 | Discharge: 2014-02-07 | Disposition: A | Discharge status: Home / Self Care | Payer: BC Managed Care – PPO | Source: Ambulatory Visit | Attending: Pulmonary Disease | Admitting: Pulmonary Disease

## 2014-02-07 DIAGNOSIS — M25611 Stiffness of right shoulder, not elsewhere classified: Secondary | ICD-10-CM

## 2014-02-07 DIAGNOSIS — M25511 Pain in right shoulder: Secondary | ICD-10-CM

## 2014-02-07 DIAGNOSIS — Z5189 Encounter for other specified aftercare: Secondary | ICD-10-CM | POA: Diagnosis not present

## 2014-02-07 NOTE — Therapy (Signed)
Occupational Therapy Treatment  Patient Details  Name: Zachary Murillo. MRN: [id number] Date of Birth: [birth year]  Encounter Date: 02/07/2014      OT End of Session - 02/07/14 1018    Visit Number 2   Number of Visits 18   Date for OT Re-Evaluation 03/01/14   Authorization Type BCBS   OT Start Time [phone]   OT Stop Time 1016   OT Time Calculation (min) 40 min   Activity Tolerance Patient tolerated treatment well   Behavior During Therapy [facility] for tasks assessed/performed      Past Medical History  Diagnosis Date  . Diabetes mellitus   . Sleep apnea   . Overweight(278.02)   . Hypertension   . Stroke     Past Surgical History  Procedure Laterality Date  . Knee arthroscopy    . Nasal septum surgery    . Cardiac catheterization    . Back surgery      There were no vitals taken for this visit.  Visit Diagnosis:  Pain in joint, shoulder region, right  Decreased range of motion of shoulder, right      Subjective Assessment - 02/07/14 0934    Symptoms S:  to be honest with you, I havent done my exercises yet.   Currently in Pain? Yes   Pain Score 6    Pain Location Shoulder   Pain Orientation Right            OT Treatments/Exercises (OP) - 02/07/14 0957    Shoulder Exercises: Supine   Protraction PROM;AAROM;10 reps   Horizontal ABduction PROM;AAROM;10 reps   External Rotation PROM;AAROM;10 reps   Internal Rotation PROM;AAROM;10 reps   Flexion PROM;AAROM;10 reps   ABduction PROM;AAROM;10 reps   Shoulder Exercises: Seated   Elevation AROM;10 reps   Extension AROM;10 reps   Retraction AROM;10 reps   Row AROM;10 reps   Shoulder Exercises: Therapy Ball   Flexion 20 reps   ABduction 20 reps   Shoulder Exercises: ROM/Strengthening   Thumb Tacks 1'    Prot/Ret//Elev/Dep 1' with minimal facilitation for technique   Manual Therapy   Myofascial Release Myofascial release and manual stretching to right upper arm, shoulder, scapular region and associated  areas to decrease pain and fascial restrictions and improve pain free mobility in his right shoulder region            OT Short Term Goals - 02/07/14 1019    OT SHORT TERM GOAL #1   Title pt will be educated on HEP   Time 3   Period Weeks   Status New   OT SHORT TERM GOAL #2   Title Pt will imporve PROM in RUE to [facility] for imporved abiltiy to reach back of head   Time 3   Period Weeks   Status New   OT SHORT TERM GOAL #3   Title Pt will decrease pain with reaching tasks to less than 5/10   Time 3   Period Weeks   Status New   OT SHORT TERM GOAL #4   Title Pt will increase RUE strength to 3+/5 for improved ability to reach into cabinets   Time 3   Period Weeks   Status New   OT SHORT TERM GOAL #5   Title Pt will decrase fascail restrictions to minimal in [place] for decrease pain with motion   Time 3   Period Weeks   Status New            Plan -  02/07/14 1018    Clinical Impression Statement A:  Patient reeducated on importance of completing HEP.  Required min tactile cues for prot/ret//elev/dep exercise.   Plan P:  Add AROM in supine and transition AAROM to seated.          Problem List Patient Active Problem List   Diagnosis Date Noted  . DDD (degenerative disc disease), 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/2013  . Difficulty in walking(719.7) 01/25/2013  . CVA (cerebral infarction) 01/22/2013  . Lack of coordination 01/22/2013  . OA (osteoarthritis) of knee 09/05/2011  . Knee pain, right 09/05/2011  . KNEE, ARTHRITIS, DEGEN./OSTEO 01/30/2009  . SHOULDER PAIN 01/16/2009  . KNEE PAIN 01/16/2009  . IMPINGEMENT SYNDROME 01/16/2009  . TENDINITIS, LEFT KNEE 01/16/2009    Vangie Bicker, OTR/L [phone]  02/07/2014, 10:20 AM

## 2014-02-09 ENCOUNTER — Encounter (HOSPITAL_COMMUNITY): Payer: Self-pay | Admitting: Specialist

## 2014-02-10 ENCOUNTER — Encounter (HOSPITAL_COMMUNITY): Payer: Self-pay

## 2014-02-10 ENCOUNTER — Ambulatory Visit (HOSPITAL_COMMUNITY)
Admission: RE | Admit: 2014-02-10 | Discharge: 2014-02-10 | Disposition: A | Discharge status: Home / Self Care | Payer: BC Managed Care – PPO | Source: Ambulatory Visit | Attending: Pulmonary Disease | Admitting: Pulmonary Disease

## 2014-02-10 DIAGNOSIS — Z5189 Encounter for other specified aftercare: Secondary | ICD-10-CM | POA: Diagnosis present

## 2014-02-10 DIAGNOSIS — M25611 Stiffness of right shoulder, not elsewhere classified: Secondary | ICD-10-CM

## 2014-02-10 DIAGNOSIS — M25511 Pain in right shoulder: Secondary | ICD-10-CM | POA: Insufficient documentation

## 2014-02-10 DIAGNOSIS — E119 Type 2 diabetes mellitus without complications: Secondary | ICD-10-CM | POA: Diagnosis not present

## 2014-02-10 DIAGNOSIS — I10 Essential (primary) hypertension: Secondary | ICD-10-CM | POA: Diagnosis not present

## 2014-02-10 NOTE — Therapy (Signed)
[facility] [place], [place], [address] Phone: [phone]   Fax:  [phone]  Occupational Therapy Treatment  Patient Details  Name: Zachary Murillo. MRN: [id number] Date of Birth: 08-10-1950  Encounter Date: 02/10/2014      OT End of Session - 02/10/14 0927    Visit Number 3   Number of Visits 18   Date for OT Re-Evaluation 03/01/14   Authorization Type BCBS   OT Start Time [phone]   OT Stop Time 0930   OT Time Calculation (min) 43 min   Activity Tolerance Patient tolerated treatment well   Behavior During Therapy [facility] for tasks assessed/performed      Past Medical History  Diagnosis Date  . Diabetes mellitus   . Sleep apnea   . Overweight(278.02)   . Hypertension   . Stroke     Past Surgical History  Procedure Laterality Date  . Knee arthroscopy    . Nasal septum surgery    . Cardiac catheterization    . Back surgery      There were no vitals taken for this visit.  Visit Diagnosis:  Pain in joint, shoulder region, right  Decreased range of motion of shoulder, right      Subjective Assessment - 02/10/14 0847    Symptoms S: It only hurts when I move it a certain way.    Currently in Pain? No/denies          [facility] OT Assessment - 02/10/14 0904    Precautions   Precautions None          OT Treatments/Exercises (OP) - 02/10/14 0905    Shoulder Exercises: Supine   Protraction PROM;5 reps;AROM;10 reps   Horizontal ABduction PROM;5 reps;AROM;10 reps   External Rotation PROM;5 reps;AROM;10 reps  shoulder abducted   Internal Rotation PROM;5 reps;AROM;10 reps  shoulder abducted   Flexion PROM;5 reps;AROM;10 reps   ABduction PROM;5 reps;AROM;10 reps   Shoulder Exercises: Seated   Elevation AROM;10 reps   Extension AROM;10 reps   Retraction AROM;10 reps   Protraction AAROM;10 reps   Horizontal ABduction AAROM;10 reps   External Rotation AAROM;10 reps   Internal Rotation AAROM;10 reps   Flexion  AAROM;10 reps   Abduction AAROM;10 reps   Shoulder Exercises: ROM/Strengthening   Wall Wash 1'   Proximal Shoulder Strengthening, Supine 10X no rest breaks   Proximal Shoulder Strengthening, Seated 10X no rest breaks   Manual Therapy   Manual Therapy Myofascial release   Myofascial Release Muscle energy technique used with right medial deltoid to relax tone and improve range of motion.            OT Short Term Goals - 02/10/14 0904    OT SHORT TERM GOAL #1   Title pt will be educated on HEP   Time 3   Period Weeks   Status On-going   OT SHORT TERM GOAL #2   Title Pt will imporve PROM in RUE to [facility] for imporved abiltiy to reach back of head   Time 3   Period Weeks   Status On-going   OT SHORT TERM GOAL #3   Title Pt will decrease pain with reaching tasks to less than 5/10   Time 3   Period Weeks   Status On-going   OT SHORT TERM GOAL #4   Title Pt will increase RUE strength to 3+/5 for improved ability to reach into cabinets   Time 3   Period Weeks   Status  On-going   OT SHORT TERM GOAL #5   Title Pt will decrase fascail restrictions to minimal in [place] for decrease pain with motion   Time 3   Period Weeks   Status On-going            Plan - 02/10/14 4235    Clinical Impression Statement A: Added AROM supine and AAROM seated. Patient tolerated well. Pt had great response to muscle energy technique in achieving further ROM with right shoulder.    Plan P: Add scapular theraband exercises.        Problem List Patient Active Problem List   Diagnosis Date Noted  . DDD (degenerative disc disease), 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/2013  . Difficulty in walking(719.7) 01/25/2013  . CVA (cerebral infarction) 01/22/2013  . Lack of coordination 01/22/2013  . OA (osteoarthritis) of knee 09/05/2011  . Knee pain, right 09/05/2011  . KNEE, ARTHRITIS, DEGEN./OSTEO 01/30/2009  . SHOULDER PAIN 01/16/2009  . KNEE PAIN 01/16/2009  . IMPINGEMENT SYNDROME 01/16/2009  . TENDINITIS, LEFT  KNEE 01/16/2009   Ailene Ravel, OTR/L,CBIS  [phone]  02/10/2014, 9:29 AM

## 2014-02-11 ENCOUNTER — Ambulatory Visit (HOSPITAL_COMMUNITY)
Admission: RE | Admit: 2014-02-11 | Discharge: 2014-02-11 | Disposition: A | Discharge status: Home / Self Care | Payer: BC Managed Care – PPO | Source: Ambulatory Visit | Attending: Pulmonary Disease | Admitting: Pulmonary Disease

## 2014-02-11 ENCOUNTER — Encounter (HOSPITAL_COMMUNITY): Payer: Self-pay

## 2014-02-11 DIAGNOSIS — Z5189 Encounter for other specified aftercare: Secondary | ICD-10-CM | POA: Diagnosis not present

## 2014-02-11 DIAGNOSIS — M25511 Pain in right shoulder: Secondary | ICD-10-CM

## 2014-02-11 DIAGNOSIS — M25611 Stiffness of right shoulder, not elsewhere classified: Secondary | ICD-10-CM

## 2014-02-11 NOTE — Therapy (Signed)
[facility] [place], [place], [address] Phone: [phone]   Fax:  [phone]  Occupational Therapy Treatment  Patient Details  Name: Zachary Murillo. MRN: [id number] Date of Birth: [birth year]  Encounter Date: 02/11/2014      OT End of Session - 02/11/14 0940    Visit Number 4   Number of Visits 18   Date for OT Re-Evaluation 03/01/14   Authorization Type BCBS   OT Start Time 0806   OT Stop Time 0845   OT Time Calculation (min) 39 min   Activity Tolerance Patient tolerated treatment well   Behavior During Therapy [facility] for tasks assessed/performed      Past Medical History  Diagnosis Date  . Diabetes mellitus   . Sleep apnea   . Overweight(278.02)   . Hypertension   . Stroke     Past Surgical History  Procedure Laterality Date  . Knee arthroscopy    . Nasal septum surgery    . Cardiac catheterization    . Back surgery      There were no vitals taken for this visit.  Visit Diagnosis:  Pain in joint, shoulder region, right  Decreased range of motion of shoulder, right      Subjective Assessment - 02/11/14 0937    Symptoms S: You worked me out good yesterday.    Currently in Pain? No/denies          [facility] OT Assessment - 02/11/14 0937    Precautions   Precautions None          OT Treatments/Exercises (OP) - 02/11/14 0937    Shoulder Exercises: Supine   Protraction PROM;5 reps;AROM;12 reps   Horizontal ABduction PROM;5 reps;AROM;12 reps   External Rotation PROM;5 reps;AROM;12 reps   Internal Rotation PROM;5 reps;AROM;12 reps   Flexion PROM;5 reps;AROM;12 reps   ABduction PROM;5 reps;AROM;12 reps   Shoulder Exercises: Seated   Elevation AROM;12 reps   Extension AROM;12 reps   Retraction AROM;12 reps   Protraction AAROM;10 reps   Horizontal ABduction AAROM;10 reps   External Rotation AAROM;10 reps   Internal Rotation AAROM;10 reps   Flexion AAROM;10 reps   Abduction AAROM;10 reps   Shoulder  Exercises: Standing   Extension Theraband;10 reps   Theraband Level (Shoulder Extension) Level 2 (Red)   Row Theraband;10 reps   Theraband Level (Shoulder Row) Level 2 (Red)   Shoulder Exercises: ROM/Strengthening   Wall Wash 1'   Proximal Shoulder Strengthening, Supine 12X no rest breaks   Proximal Shoulder Strengthening, Seated 10X no rest breaks   Manual Therapy   Manual Therapy Myofascial release   Myofascial Release Muscle energy technique used with right medial deltoid to relax tone and improve range of motion.             OT Short Term Goals - 02/11/14 0940    OT SHORT TERM GOAL #1   Title pt will be educated on HEP   Time 3   Period Weeks   Status On-going   OT SHORT TERM GOAL #2   Title Pt will imporve PROM in RUE to [facility] for imporved abiltiy to reach back of head   Time 3   Period Weeks   Status On-going   OT SHORT TERM GOAL #3   Title Pt will decrease pain with reaching tasks to less than 5/10   Time 3   Period Weeks   Status On-going   OT SHORT TERM GOAL #4   Title Pt  will increase RUE strength to 3+/5 for improved ability to reach into cabinets   Time 3   Period Weeks   Status On-going   OT SHORT TERM GOAL #5   Title Pt will decrase fascail restrictions to minimal in [place] for decrease pain with motion   Time 3   Period Weeks   Status On-going            Plan - 02/11/14 0940    Clinical Impression Statement A: Added theraband scapular strengthening exercises and increased reps with supine AAROM. Patient tolerated well.    Plan P: Increase AAROM reps as able.         Problem List Patient Active Problem List   Diagnosis Date Noted  . DDD (degenerative disc disease), 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/2013  . Difficulty in walking(719.7) 01/25/2013  . CVA (cerebral infarction) 01/22/2013  . Lack of coordination 01/22/2013  . OA (osteoarthritis) of knee 09/05/2011  . Knee pain, right 09/05/2011  . KNEE, ARTHRITIS, DEGEN./OSTEO 01/30/2009  . SHOULDER PAIN  01/16/2009  . KNEE PAIN 01/16/2009  . IMPINGEMENT SYNDROME 01/16/2009  . TENDINITIS, LEFT KNEE 01/16/2009    Ailene Ravel, OTR/L,CBIS  [phone]  02/11/2014, 9:41 AM

## 2014-02-14 ENCOUNTER — Ambulatory Visit (HOSPITAL_COMMUNITY)
Admission: RE | Admit: 2014-02-14 | Discharge: 2014-02-14 | Disposition: A | Discharge status: Home / Self Care | Payer: BC Managed Care – PPO | Source: Ambulatory Visit | Attending: Pulmonary Disease | Admitting: Pulmonary Disease

## 2014-02-14 ENCOUNTER — Encounter (HOSPITAL_COMMUNITY): Payer: Self-pay

## 2014-02-14 DIAGNOSIS — M25511 Pain in right shoulder: Secondary | ICD-10-CM

## 2014-02-14 DIAGNOSIS — M25611 Stiffness of right shoulder, not elsewhere classified: Secondary | ICD-10-CM

## 2014-02-14 DIAGNOSIS — Z5189 Encounter for other specified aftercare: Secondary | ICD-10-CM | POA: Diagnosis not present

## 2014-02-14 NOTE — Therapy (Signed)
[facility] [place], [place], [address] Phone: [phone]   Fax:  [phone]  Occupational Therapy Treatment  Patient Details  Name: Zachary Murillo. MRN: [id number] Date of Birth: Mar 25, 1950  Encounter Date: 02/14/2014      OT End of Session - 02/14/14 1428    Visit Number 5   Number of Visits 18   Date for OT Re-Evaluation 03/01/14   Authorization Type BCBS   OT Start Time 1400   OT Stop Time 1430   OT Time Calculation (min) 30 min   Activity Tolerance Patient tolerated treatment well   Behavior During Therapy WFL for tasks assessed/performed      Past Medical History  Diagnosis Date  . Diabetes mellitus   . Sleep apnea   . Overweight(278.02)   . Hypertension   . Stroke     Past Surgical History  Procedure Laterality Date  . Knee arthroscopy    . Nasal septum surgery    . Cardiac catheterization    . Back surgery      There were no vitals taken for this visit.  Visit Diagnosis:  Pain in joint, shoulder region, right  Decreased range of motion of shoulder, right      Subjective Assessment - 02/14/14 1418    Symptoms S: I went to the pool this morning already.    Currently in Pain? No/denies          [facility] OT Assessment - 02/14/14 1410    Precautions   Precautions None          OT Treatments/Exercises (OP) - 02/14/14 1408    Shoulder Exercises: Supine   Protraction AROM;12 reps   Horizontal ABduction AROM;12 reps   External Rotation AROM;12 reps   Internal Rotation AROM;12 reps   Flexion AROM;12 reps   ABduction AROM;12 reps   Shoulder Exercises: Seated   Protraction AAROM;12 reps   Horizontal ABduction AAROM;12 reps   External Rotation AAROM;12 reps   Internal Rotation AAROM;12 reps   Flexion AAROM;12 reps   Abduction AAROM;12 reps   Shoulder Exercises: ROM/Strengthening   Wall Wash 1'   X to V Arms 12X   Proximal Shoulder Strengthening, Supine 12X no rest breaks   Proximal Shoulder  Strengthening, Seated 12X no rest breaks            OT Short Term Goals - 02/14/14 1418    OT SHORT TERM GOAL #1   Title pt will be educated on HEP   Time 3   Period Weeks   Status On-going   OT SHORT TERM GOAL #2   Title Pt will imporve PROM in RUE to [facility] for imporved abiltiy to reach back of head   Time 3   Period Weeks   Status On-going   OT SHORT TERM GOAL #3   Title Pt will decrease pain with reaching tasks to less than 5/10   Time 3   Period Weeks   Status On-going   OT SHORT TERM GOAL #4   Title Pt will increase RUE strength to 3+/5 for improved ability to reach into cabinets   Time 3   Period Weeks   Status On-going   OT SHORT TERM GOAL #5   Title Pt will decrase fascail restrictions to minimal in [facility] for decrease pain with motion   Time 3   Period Weeks   Status On-going            Plan - 02/14/14 1428  Clinical Impression Statement A: Added X to V arms. Increased AAROM seated reps to 12. Patient tolerated well. Patient arrived early so manual therapy was not completed this session.    Plan P: Progress to AROM as able to tolerate.         Problem List Patient Active Problem List   Diagnosis Date Noted  . DDD (degenerative disc disease), 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/2013  . Difficulty in walking(719.7) 01/25/2013  . CVA (cerebral infarction) 01/22/2013  . Lack of coordination 01/22/2013  . OA (osteoarthritis) of knee 09/05/2011  . Knee pain, right 09/05/2011  . KNEE, ARTHRITIS, DEGEN./OSTEO 01/30/2009  . SHOULDER PAIN 01/16/2009  . KNEE PAIN 01/16/2009  . IMPINGEMENT SYNDROME 01/16/2009  . TENDINITIS, LEFT KNEE 01/16/2009    Ailene Ravel, OTR/L,CBIS  [phone]  02/14/2014, 2:30 PM

## 2014-02-16 ENCOUNTER — Ambulatory Visit (HOSPITAL_COMMUNITY)
Admission: RE | Admit: 2014-02-16 | Discharge: 2014-02-16 | Disposition: A | Discharge status: Home / Self Care | Payer: BC Managed Care – PPO | Source: Ambulatory Visit | Attending: Pulmonary Disease | Admitting: Pulmonary Disease

## 2014-02-16 DIAGNOSIS — M25611 Stiffness of right shoulder, not elsewhere classified: Secondary | ICD-10-CM

## 2014-02-16 DIAGNOSIS — M25511 Pain in right shoulder: Secondary | ICD-10-CM

## 2014-02-16 DIAGNOSIS — Z5189 Encounter for other specified aftercare: Secondary | ICD-10-CM | POA: Diagnosis not present

## 2014-02-16 NOTE — Patient Instructions (Signed)
Scapular: Retraction in External Rotation   With hands clasped behind head, elbows up, pull elbows back, pinching shoulder blades together. Repeat 10 ____ times per set. Do ___1_ sets per session. Do __2__ sessions per day.  http://orth.exer.us/854   Copyright  VHI. All rights reserved.

## 2014-02-16 NOTE — Therapy (Signed)
[facility] [place], [place], [address] Phone: [phone]   Fax:  [phone]  Occupational Therapy Treatment  Patient Details  Name: Zachary Murillo. MRN: [id number] Date of Birth: [birth year]  Encounter Date: 02/16/2014      OT End of Session - 02/16/14 1431    Visit Number 6   Number of Visits 18   Date for OT Re-Evaluation 03/01/14   Authorization Type BCBS   OT Start Time 1349   OT Stop Time 1435   OT Time Calculation (min) 46 min   Activity Tolerance Patient tolerated treatment well   Behavior During Therapy WFL for tasks assessed/performed      Past Medical History  Diagnosis Date  . Diabetes mellitus   . Sleep apnea   . Overweight(278.02)   . Hypertension   . Stroke     Past Surgical History  Procedure Laterality Date  . Knee arthroscopy    . Nasal septum surgery    . Cardiac catheterization    . Back surgery      There were no vitals taken for this visit.  Visit Diagnosis:  Pain in joint, shoulder region, right  Decreased range of motion of shoulder, right      Subjective Assessment - 02/16/14 1353    Symptoms S:  I think my shoulder is getting better.  The range is better, and it hurts less.   Currently in Pain? Yes   Pain Score 4    Pain Location Shoulder   Pain Orientation Right            OT Treatments/Exercises (OP) - 02/16/14 0001    Shoulder Exercises: Supine   Protraction PROM;10 reps;AROM;15 reps   Horizontal ABduction PROM;10 reps;AROM;15 reps   External Rotation PROM;10 reps;AROM;15 reps   Internal Rotation PROM;10 reps;AROM;15 reps   Flexion PROM;10 reps;AROM;15 reps   ABduction PROM;10 reps;AROM;15 reps   Shoulder Exercises: Seated   Protraction AROM;10 reps   Horizontal ABduction AROM;10 reps   External Rotation AROM;10 reps   Internal Rotation AROM;10 reps   Flexion AROM;10 reps;Limitations   Flexion Limitations min facilitation from OT for full rangr   Abduction  AROM;10 reps;Limitations   ABduction Limitations min facilitation from OT for full range   Shoulder Exercises: ROM/Strengthening   UBE (Upper Arm Bike) 2 minutes forward and 2 minutes reverse at 1.0   Wall Wash 2'   Proximal Shoulder Strengthening, Supine 12X no rest breaks   Proximal Shoulder Strengthening, Seated 12X no rest breaks   Manual Therapy   Manual Therapy Myofascial release   Myofascial Release Myofascial release and manual stretching to right upper arm, shoulder, scapular region and associated areas to decrease pain and fascial restrictions and improve pain free mobility in his right shoulder region          OT Education - 02/16/14 1415    Education Details star gazer stretch   Person(s) Educated Patient   Methods Explanation;Demonstration;Handout   Comprehension Verbalized understanding;Returned demonstration          OT Short Term Goals - 02/16/14 1432    OT SHORT TERM GOAL #1   Title pt will be educated on HEP   Time 3   Period Weeks   Status On-going   OT SHORT TERM GOAL #2   Title Pt will imporve PROM in RUE to [facility] for imporved abiltiy to reach back of head   Time 3   Period Weeks   Status On-going  OT SHORT TERM GOAL #3   Title Pt will decrease pain with reaching tasks to less than 5/10   Time 3   Period Weeks   Status On-going   OT SHORT TERM GOAL #4   Title Pt will increase RUE strength to 3+/5 for improved ability to reach into cabinets   Time 3   Period Weeks   Status On-going   OT SHORT TERM GOAL #5   Title Pt will decrase fascail restrictions to minimal in [place] for decrease pain with motion   Time 3   Period Weeks   Status On-going            Plan - 02/16/14 1431    Clinical Impression Statement A:  Initiiated UBE and AROM in seated.  Requires assistance for AROM through full range in flexion abduction.   Plan P:  Complete AROM flexion and abduction without assistance.     Problem List Patient Active Problem List   Diagnosis  Date Noted  . DDD (degenerative disc disease), 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/2013  . Difficulty in walking(719.7) 01/25/2013  . CVA (cerebral infarction) 01/22/2013  . Lack of coordination 01/22/2013  . OA (osteoarthritis) of knee 09/05/2011  . Knee pain, right 09/05/2011  . KNEE, ARTHRITIS, DEGEN./OSTEO 01/30/2009  . SHOULDER PAIN 01/16/2009  . KNEE PAIN 01/16/2009  . IMPINGEMENT SYNDROME 01/16/2009  . TENDINITIS, LEFT KNEE 01/16/2009    Vangie Bicker, OTR/L [phone]  02/16/2014, 2:34 PM

## 2014-02-18 ENCOUNTER — Ambulatory Visit (HOSPITAL_COMMUNITY)
Admission: RE | Admit: 2014-02-18 | Discharge: 2014-02-18 | Disposition: A | Discharge status: Home / Self Care | Payer: BC Managed Care – PPO | Source: Ambulatory Visit | Attending: Pulmonary Disease | Admitting: Pulmonary Disease

## 2014-02-18 DIAGNOSIS — Z5189 Encounter for other specified aftercare: Secondary | ICD-10-CM | POA: Diagnosis not present

## 2014-02-18 DIAGNOSIS — M25511 Pain in right shoulder: Secondary | ICD-10-CM

## 2014-02-18 DIAGNOSIS — M25611 Stiffness of right shoulder, not elsewhere classified: Secondary | ICD-10-CM

## 2014-02-18 NOTE — Therapy (Signed)
[facility] [place], [place], [address] Phone: [phone]   Fax:  [phone]  Occupational Therapy Treatment  Patient Details  Name: Zachary Murillo. MRN: [id number] Date of Birth: 05/29/1950  Encounter Date: 02/18/2014      OT End of Session - 02/18/14 1429    Visit Number 7   Number of Visits 18   Date for OT Re-Evaluation 03/01/14   Authorization Type BCBS   OT Start Time 1348   OT Stop Time 1435   OT Time Calculation (min) 47 min   Activity Tolerance Patient tolerated treatment well   Behavior During Therapy WFL for tasks assessed/performed      Past Medical History  Diagnosis Date  . Diabetes mellitus   . Sleep apnea   . Overweight(278.02)   . Hypertension   . Stroke     Past Surgical History  Procedure Laterality Date  . Knee arthroscopy    . Nasal septum surgery    . Cardiac catheterization    . Back surgery      There were no vitals taken for this visit.  Visit Diagnosis:  Pain in joint, shoulder region, right  Decreased range of motion of shoulder, right      Subjective Assessment - 02/18/14 1422    Symptoms S: I keep getting a kink in my mid back.   Limitations progress as tolerated   Pain Score 4    Pain Location Shoulder   Pain Orientation Right            OT Treatments/Exercises (OP) - 02/18/14 0001    Exercises   Exercises Shoulder   Shoulder Exercises: Supine   Protraction PROM;Strengthening;10 reps   Protraction Weight (lbs) 1   Horizontal ABduction PROM;Strengthening;10 reps   Horizontal ABduction Weight (lbs) 1   External Rotation PROM;Strengthening;10 reps   External Rotation Weight (lbs) 1   Internal Rotation PROM;Strengthening;10 reps   Internal Rotation Weight (lbs) 1   Flexion PROM;Strengthening;10 reps   Shoulder Flexion Weight (lbs) 1   ABduction PROM;Strengthening;10 reps   Shoulder ABduction Weight (lbs) 1   Shoulder Exercises: Seated   Protraction AROM;15 reps    Horizontal ABduction AROM;15 reps   External Rotation AROM;15 reps   Internal Rotation AROM;15 reps   Flexion AROM;10 reps   Abduction AROM;10 reps   Shoulder Exercises: ROM/Strengthening   UBE (Upper Arm Bike) 2 minutes forward and 2 minutes reverse at 1.0   Proximal Shoulder Strengthening, Supine 12X no rest breaks   Proximal Shoulder Strengthening, Seated 12X no rest breaks   Manual Therapy   Manual Therapy Myofascial release   Myofascial Release   Myofascial release and manual stretching to right upper arm, shoulder, scapular region and associated areas to decrease pain and fascial restrictions and improve pain free mobility in his right shoulder region   MFR to latissimus dorsi  muscle to decrease spasms            OT Short Term Goals - 02/18/14 1434    OT SHORT TERM GOAL #1   Title pt will be educated on HEP   Time 3   Period Weeks   Status On-going   OT SHORT TERM GOAL #2   Title Pt will imporve PROM in RUE to [facility] for imporved abiltiy to reach back of head   Time 3   Period Weeks   Status On-going   OT SHORT TERM GOAL #3   Title Pt will decrease pain with reaching  tasks to less than 5/10   Time 3   Period Weeks   Status On-going   OT SHORT TERM GOAL #4   Title Pt will increase RUE strength to 3+/5 for improved ability to reach into cabinets   Time 3   Period Weeks   Status On-going   OT SHORT TERM GOAL #5   Title Pt will decrase fascail restrictions to minimal in [place] for decrease pain with motion   Time 3   Period Weeks   Status On-going            Plan - 02/18/14 1433    Clinical Impression Statement A:  completed MFR to latissimus dorsi region with good relief from muscle spasm afterwards.  Added strengthening exercises in supine.   Plan P:  Add x to v and w arm exercises for scapular stability.     Problem List Patient Active Problem List   Diagnosis Date Noted  . DDD (degenerative disc disease), 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/2013  . Difficulty in  walking(719.7) 01/25/2013  . CVA (cerebral infarction) 01/22/2013  . Lack of coordination 01/22/2013  . OA (osteoarthritis) of knee 09/05/2011  . Knee pain, right 09/05/2011  . KNEE, ARTHRITIS, DEGEN./OSTEO 01/30/2009  . SHOULDER PAIN 01/16/2009  . KNEE PAIN 01/16/2009  . IMPINGEMENT SYNDROME 01/16/2009  . TENDINITIS, LEFT KNEE 01/16/2009    Vangie Bicker, OTR/L [phone]  02/18/2014, 2:36 PM

## 2014-02-21 ENCOUNTER — Encounter (HOSPITAL_COMMUNITY): Payer: Self-pay

## 2014-02-21 ENCOUNTER — Ambulatory Visit (HOSPITAL_COMMUNITY)
Admission: RE | Admit: 2014-02-21 | Discharge: 2014-02-21 | Disposition: A | Discharge status: Home / Self Care | Payer: BC Managed Care – PPO | Source: Ambulatory Visit | Attending: Pulmonary Disease | Admitting: Pulmonary Disease

## 2014-02-21 DIAGNOSIS — M25611 Stiffness of right shoulder, not elsewhere classified: Secondary | ICD-10-CM

## 2014-02-21 DIAGNOSIS — Z5189 Encounter for other specified aftercare: Secondary | ICD-10-CM | POA: Diagnosis not present

## 2014-02-21 DIAGNOSIS — M25511 Pain in right shoulder: Secondary | ICD-10-CM

## 2014-02-21 NOTE — Therapy (Signed)
[facility] [place], [place], [address] Phone: [phone]   Fax:  [phone]  Occupational Therapy Treatment  Patient Details  Name: Zachary Murillo. MRN: [id number] Date of Birth: 1950-12-15  Encounter Date: 02/21/2014      OT End of Session - 02/21/14 1503    Visit Number 8   Number of Visits 18   Date for OT Re-Evaluation 03/01/14   Authorization Type BCBS   OT Start Time 1435   OT Stop Time 1500   OT Time Calculation (min) 25 min   Activity Tolerance Patient tolerated treatment well   Behavior During Therapy WFL for tasks assessed/performed      Past Medical History  Diagnosis Date  . Diabetes mellitus   . Sleep apnea   . Overweight(278.02)   . Hypertension   . Stroke     Past Surgical History  Procedure Laterality Date  . Knee arthroscopy    . Nasal septum surgery    . Cardiac catheterization    . Back surgery      There were no vitals taken for this visit.  Visit Diagnosis:  Pain in joint, shoulder region, right  Decreased range of motion of shoulder, right      Subjective Assessment - 02/21/14 1433    Symptoms "I went swimming this morning - i try to move my arm and shoulder as much as I can in the water."   Currently in Pain? Yes   Pain Score 3    Pain Location Shoulder   Pain Orientation Right   Pain Type Acute pain;Chronic pain            OT Treatments/Exercises (OP) - 02/21/14 1436    Shoulder Exercises: Supine   Protraction PROM;10 reps   Horizontal ABduction PROM;10 reps   External Rotation PROM;10 reps   Internal Rotation PROM;10 reps   Flexion PROM;10 reps   ABduction PROM;10 reps   Shoulder Exercises: Seated   Protraction AROM;15 reps   Horizontal ABduction AROM;15 reps   External Rotation AROM;15 reps   Internal Rotation AROM;15 reps   Flexion AROM;15 reps   Abduction AROM;15 reps   Shoulder Exercises: ROM/Strengthening   "W" Arms 12x   X to V Arms 12X   Proximal  Shoulder Strengthening, Seated 12X no rest breaks   Manual Therapy   Manual Therapy Myofascial release   Myofascial Release Myofascial release and manual stretching to right upper arm, shoulder, scapular region and associated areas to decrease pain and fascial restrictions and improve pain free mobility in his right shoulder region.              OT Short Term Goals - 02/21/14 1504    OT SHORT TERM GOAL #1   Title pt will be educated on HEP   Status On-going   OT SHORT TERM GOAL #2   Title Pt will imporve PROM in RUE to [facility] for imporved abiltiy to reach back of head   Status On-going   OT SHORT TERM GOAL #3   Title Pt will decrease pain with reaching tasks to less than 5/10   Status On-going   OT SHORT TERM GOAL #4   Title Pt will increase RUE strength to 3+/5 for improved ability to reach into cabinets   Status On-going   OT SHORT TERM GOAL #5   Title Pt will decrase fascail restrictions to minimal in [place] for decrease pain with motion   Status On-going  Plan - 02/21/14 1504    Clinical Impression Statement Pt requested shortened session today, due to later than normal appointment time and obligation at 3:15. Continued standing AROM this session, and pt able to tolerate 15 reps with all exercises. Continued w-v and added W arms this session - AROm in standing.  pt tolerated, but with fatigue and less than full ROM.     Plan Resume supine strengthening.      Problem List Patient Active Problem List   Diagnosis Date Noted  . DDD (degenerative disc disease), 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/2013  . Difficulty in walking(719.7) 01/25/2013  . CVA (cerebral infarction) 01/22/2013  . Lack of coordination 01/22/2013  . OA (osteoarthritis) of knee 09/05/2011  . Knee pain, right 09/05/2011  . KNEE, ARTHRITIS, DEGEN./OSTEO 01/30/2009  . SHOULDER PAIN 01/16/2009  . KNEE PAIN 01/16/2009  . IMPINGEMENT SYNDROME 01/16/2009  . TENDINITIS, LEFT KNEE 01/16/2009    Bea Graff  Macallister Ashmead, MS, OTR/L [place] [phone] 02/21/2014, 3:06 PM

## 2014-02-23 ENCOUNTER — Encounter (HOSPITAL_COMMUNITY): Payer: Self-pay

## 2014-02-23 ENCOUNTER — Ambulatory Visit (HOSPITAL_COMMUNITY)
Admission: RE | Admit: 2014-02-23 | Discharge: 2014-02-23 | Disposition: A | Discharge status: Home / Self Care | Payer: BC Managed Care – PPO | Source: Ambulatory Visit | Attending: Pulmonary Disease | Admitting: Pulmonary Disease

## 2014-02-23 DIAGNOSIS — Z5189 Encounter for other specified aftercare: Secondary | ICD-10-CM | POA: Diagnosis not present

## 2014-02-23 DIAGNOSIS — M25611 Stiffness of right shoulder, not elsewhere classified: Secondary | ICD-10-CM

## 2014-02-23 DIAGNOSIS — M25511 Pain in right shoulder: Secondary | ICD-10-CM

## 2014-02-23 NOTE — Therapy (Signed)
[facility] [place], [place], [address] Phone: [phone]   Fax:  [phone]  Occupational Therapy Treatment  Patient Details  Name: Zachary Murillo. MRN: [id number] Date of Birth: 02/08/1951  Encounter Date: 02/23/2014      OT End of Session - 02/23/14 1525    Visit Number 9   Number of Visits 18   Date for OT Re-Evaluation 03/01/14   Authorization Type BCBS   OT Start Time 1436   OT Stop Time 1500   OT Time Calculation (min) 24 min   Activity Tolerance Patient tolerated treatment well   Behavior During Therapy WFL for tasks assessed/performed      Past Medical History  Diagnosis Date  . Diabetes mellitus   . Sleep apnea   . Overweight(278.02)   . Hypertension   . Stroke     Past Surgical History  Procedure Laterality Date  . Knee arthroscopy    . Nasal septum surgery    . Cardiac catheterization    . Back surgery      There were no vitals taken for this visit.  Visit Diagnosis:  Decreased range of motion of shoulder, right  Pain in joint, shoulder region, right      Subjective Assessment - 02/23/14 1520    Symptoms S: My shoulder got a very good workout this morning in the pool.   Currently in Pain? Yes   Pain Score 2    Pain Location Shoulder   Pain Orientation Right   Pain Descriptors / Indicators Aching   Pain Type Chronic pain          [facility] OT Assessment - 02/23/14 1521    Precautions   Precautions None          OT Treatments/Exercises (OP) - 02/23/14 1521    Shoulder Exercises: Supine   Protraction PROM;5 reps   Horizontal ABduction PROM;5 reps   External Rotation PROM;5 reps   Internal Rotation PROM;5 reps   Flexion PROM;5 reps   ABduction PROM;5 reps   Shoulder Exercises: Stretch   Other Shoulder Stretches Right lateral flexion stretch completed sidelying  with 30" hold.    Manual Therapy   Manual Therapy Myofascial release   Myofascial Release Posterior cervical positional  release completed to right upper trapezius to reduce trigger point pain and relax muscle. Muscle energy technique completed to anterior deltoid to relax tone and muscle spasm and improve ROM.             OT Short Term Goals - 02/23/14 1527    OT SHORT TERM GOAL #1   Title pt will be educated on HEP   Status On-going   OT SHORT TERM GOAL #2   Title Pt will imporve PROM in RUE to [facility] for imporved abiltiy to reach back of head   Status On-going   OT SHORT TERM GOAL #3   Title Pt will decrease pain with reaching tasks to less than 5/10   Status On-going   OT SHORT TERM GOAL #4   Title Pt will increase RUE strength to 3+/5 for improved ability to reach into cabinets   Status On-going   OT SHORT TERM GOAL #5   Title Pt will decrase fascail restrictions to minimal in [place] for decrease pain with motion   Status On-going            Plan - 02/23/14 1525    Clinical Impression Statement A: Session shortened today due to prior scheduled  arrangement. Session with focus on muscle energy technique, positional release and stretching to increase lateral flexion and shoulder ROM.    Plan P: Resume missed strengthening exercises.        Problem List Patient Active Problem List   Diagnosis Date Noted  . DDD (degenerative disc disease), 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/2013  . Difficulty in walking(719.7) 01/25/2013  . CVA (cerebral infarction) 01/22/2013  . Lack of coordination 01/22/2013  . OA (osteoarthritis) of knee 09/05/2011  . Knee pain, right 09/05/2011  . KNEE, ARTHRITIS, DEGEN./OSTEO 01/30/2009  . SHOULDER PAIN 01/16/2009  . KNEE PAIN 01/16/2009  . IMPINGEMENT SYNDROME 01/16/2009  . TENDINITIS, LEFT KNEE 01/16/2009    Ailene Ravel, OTR/L,CBIS  [phone]  02/23/2014, 3:28 PM

## 2014-02-25 ENCOUNTER — Ambulatory Visit (HOSPITAL_COMMUNITY): Payer: BC Managed Care – PPO

## 2014-02-28 ENCOUNTER — Encounter: Payer: Self-pay | Admitting: Nutrition

## 2014-02-28 ENCOUNTER — Ambulatory Visit (HOSPITAL_COMMUNITY)
Admission: RE | Admit: 2014-02-28 | Discharge: 2014-02-28 | Disposition: A | Discharge status: Home / Self Care | Payer: BC Managed Care – PPO | Source: Ambulatory Visit | Attending: Pulmonary Disease | Admitting: Pulmonary Disease

## 2014-02-28 ENCOUNTER — Encounter: Payer: BC Managed Care – PPO | Attending: "Endocrinology | Admitting: Nutrition

## 2014-02-28 VITALS — Ht 71.0 in | Wt 277.0 lb

## 2014-02-28 DIAGNOSIS — E1165 Type 2 diabetes mellitus with hyperglycemia: Secondary | ICD-10-CM

## 2014-02-28 DIAGNOSIS — E118 Type 2 diabetes mellitus with unspecified complications: Secondary | ICD-10-CM | POA: Diagnosis present

## 2014-02-28 DIAGNOSIS — M25511 Pain in right shoulder: Secondary | ICD-10-CM

## 2014-02-28 DIAGNOSIS — Z713 Dietary counseling and surveillance: Secondary | ICD-10-CM | POA: Diagnosis not present

## 2014-02-28 DIAGNOSIS — M25611 Stiffness of right shoulder, not elsewhere classified: Secondary | ICD-10-CM

## 2014-02-28 DIAGNOSIS — IMO0002 Reserved for concepts with insufficient information to code with codable children: Secondary | ICD-10-CM

## 2014-02-28 DIAGNOSIS — Z5189 Encounter for other specified aftercare: Secondary | ICD-10-CM | POA: Diagnosis not present

## 2014-02-28 NOTE — Patient Instructions (Signed)
Plan:  Aim for 3-4 Carb Choices per meal (45-60 grams) +/- 1 either way  Avoid snacks unless having a low blood sugar.  Include protein in moderation with your meals and snacks Consider meeting with a personal trainer once a week for a few times to provide instructions for more intense workouts for improved weight loss results. Try putting a light yogurt in freezer for a dessert if craving sweets.  Goal: 1. Lose 1-2 lbs per week  2. Follow Plate Method and count carbs 3. Get A1C to 6.5% in three months. 4. Keep a food journal and BS log and bring at each visit.

## 2014-02-28 NOTE — Therapy (Signed)
[facility] [address] [place], [place], [address] Phone: [phone]   Fax:  [phone]  Occupational Therapy Treatment  Patient Details  Name: Zachary Murillo. MRN: [id number] Date of Birth: [birth year]  Encounter Date: 02/28/2014      OT End of Session - 02/28/14 1440    Visit Number 10   Number of Visits 18   Date for OT Re-Evaluation 03/01/14   OT Start Time 1401   OT Stop Time 1444   OT Time Calculation (min) 43 min   Activity Tolerance Patient tolerated treatment well   Behavior During Therapy [facility] for tasks assessed/performed      Past Medical History  Diagnosis Date  . Diabetes mellitus   . Sleep apnea   . Overweight(278.02)   . Hypertension   . Stroke     Past Surgical History  Procedure Laterality Date  . Knee arthroscopy    . Nasal septum surgery    . Cardiac catheterization    . Back surgery      There were no vitals taken for this visit.  Visit Diagnosis:  Decreased range of motion of shoulder, right  Pain in joint, shoulder region, right      OT Treatments/Exercises (OP) - 02/28/14 1424    Shoulder Exercises: Supine   Protraction PROM;5 reps;Strengthening;15 reps   Protraction Weight (lbs) 1   Horizontal ABduction PROM;5 reps;Strengthening;15 reps   Horizontal ABduction Weight (lbs) 1   External Rotation PROM;5 reps;Strengthening;15 reps   External Rotation Weight (lbs) 1   Internal Rotation PROM;5 reps;Strengthening;15 reps   Internal Rotation Weight (lbs) 1   Flexion PROM;5 reps;Strengthening;15 reps   Shoulder Flexion Weight (lbs) 1   ABduction PROM;5 reps;Strengthening;15 reps   Shoulder ABduction Weight (lbs) 1   Shoulder Exercises: ROM/Strengthening   UBE (Upper Arm Bike) 3 minutes forward and 3 minutes in reverse at 2.0    "W" Arms 10 times with min facilitation for retraction   X to V Arms 10 times with min facilitation for retraction   Proximal Shoulder Strengthening, Seated 10  X each with 1# resistance without resting   Manual Therapy   Manual Therapy Myofascial release   Myofascial Release Myofascial release and manual stretching to right upper arm, shoulder, scapular region and associated areas to decrease pain and fascial restrictions and improve pain free mobility in his right shoulder region          OT Short Term Goals - 02/23/14 1527    OT SHORT TERM GOAL #1   Title pt will be educated on HEP   Status On-going   OT SHORT TERM GOAL #2   Title Pt will imporve PROM in RUE to [facility] for imporved abiltiy to reach back of head   Status On-going   OT SHORT TERM GOAL #3   Title Pt will decrease pain with reaching tasks to less than 5/10   Status On-going   OT SHORT TERM GOAL #4   Title Pt will increase RUE strength to 3+/5 for improved ability to reach into cabinets   Status On-going   OT SHORT TERM GOAL #5   Title Pt will decrase fascail restrictions to minimal in [place] for decrease pain with motion   Status On-going          Plan - 02/28/14 1441    Clinical Impression Statement A:  Able to complete all exercises in supine with 1# resistance.  Visible catch with abduction from 80-110.  Increased  UBE to 2.0.   Plan P:  Reassess on Wednesday of this week. Attempt 2 pounds with supine strengthening, attempt prone AROM.        Problem List Patient Active Problem List   Diagnosis Date Noted  . DDD (degenerative disc disease), 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/2013  . Difficulty in walking(719.7) 01/25/2013  . CVA (cerebral infarction) 01/22/2013  . Lack of coordination 01/22/2013  . OA (osteoarthritis) of knee 09/05/2011  . Knee pain, right 09/05/2011  . KNEE, ARTHRITIS, DEGEN./OSTEO 01/30/2009  . SHOULDER PAIN 01/16/2009  . KNEE PAIN 01/16/2009  . IMPINGEMENT SYNDROME 01/16/2009  . TENDINITIS, LEFT KNEE 01/16/2009    Vangie Bicker, OTR/L [phone]  02/28/2014, 2:50 PM  [facility] [place], [place], [address] Phone: [phone]   Fax:  [phone]

## 2014-02-28 NOTE — Progress Notes (Signed)
  Medical Nutrition Therapy:  Appt start time: 9:30 end time:  945 pm  Assessment:  Primary concerns today: DIabetes. A1C down to 6.9% from 7.1%. Weight hasn't improved much but is the same as it was last time he saw Dr. Dorris Fetch. Going to the [facility] and walking in the water for an hour and doing some low impact aerobics. Has a craving for something sweet every now and then. Hie is on Metformin, Vitcoza and Invokana. Off basal bolus insulin. Has lost a total of 71 lbs since seeing Dr. Dorris Fetch over the last 1-2 years. He still wants to lose more weight. Has back problems that limits his mobility some. History of stroke but apparently doing well. Isn't thinking about gastric bypass surgery anymore as long as he can keep his weight loss. His wife prepares all his food with accurate portions and following meal plan very detailed.  Preferred Learning Style:   Visual  Hands on   Learning Readiness:   Not ready  Contemplating  Ready  Change in progress  MEDICATIONS: see list   DIETARY INTAKE:  24-hr recall:   B) 1 strip [place] bacon, 2 eggs, 1 slice toast and 3/4 corn flakes or wheaties and skim milk. L) Hamburger steak, with cheese, mushrooms, baked potato and toss salad.;  D Protein, 1-2 low carb vegetables, and 3 carb choices for dinner, water  Usual physical activity: liimited due to back surgery recently  Estimated energy needs: 1800 calories 200 g carbohydrates 135 g protein 50 g fat  Progress Towards Goal(s):  In progress.   Nutritional Diagnosis:  NB-1.1 Food and nutrition-related knowledge deficit As related to diabets.  As evidenced by A1C of 7%.    Intervention:  Nutrition couinseling and diabetes education. Plan:  Aim for 3-4 Carb Choices per meal (45-60 grams) +/- 1 either way  Avoid snacks unless having a low blood sugar.  Include protein in moderation with your meals and snacks Consider meeting with a personal trainer once a week for a few times to provide instructions  for more intense workouts for improved weight loss results. Try putting a light yogurt in freezer for a dessert if craving sweets.  Goal: 1. Lose 1-2 lbs per week  2. Follow Plate Method and count carbs 3. Get A1C to 6.5% in three months. 4. Keep a food journal and BS log and bring at each visit.  Teaching Method Utilized:  Visual Auditory Hands on  Handouts given during visit include: Carb Counting and Food Label handouts Meal Plan Card  My Plate  Barriers to learning/adherence to lifestyle change: none  Demonstrated degree of understanding via:  Teach Back   Monitoring/Evaluation:  Dietary intake, exercise, SBG, and body weight in 3 month(s).

## 2014-03-01 ENCOUNTER — Encounter (HOSPITAL_COMMUNITY): Payer: BC Managed Care – PPO

## 2014-03-02 ENCOUNTER — Ambulatory Visit (HOSPITAL_COMMUNITY)
Admission: RE | Admit: 2014-03-02 | Discharge: 2014-03-02 | Disposition: A | Discharge status: Home / Self Care | Payer: BC Managed Care – PPO | Source: Ambulatory Visit | Attending: Pulmonary Disease | Admitting: Pulmonary Disease

## 2014-03-02 ENCOUNTER — Encounter (HOSPITAL_COMMUNITY): Payer: Self-pay

## 2014-03-02 DIAGNOSIS — Z5189 Encounter for other specified aftercare: Secondary | ICD-10-CM | POA: Diagnosis not present

## 2014-03-02 DIAGNOSIS — M25511 Pain in right shoulder: Secondary | ICD-10-CM

## 2014-03-02 DIAGNOSIS — M25611 Stiffness of right shoulder, not elsewhere classified: Secondary | ICD-10-CM

## 2014-03-02 NOTE — Therapy (Signed)
[facility] [address] [place], [place], [address] Phone: [phone]   Fax:  [phone]  Occupational Therapy Treatment  Patient Details  Name: Zachary Murillo. MRN: [id number] Date of Birth: [birth year]  Encounter Date: 03/02/2014      OT End of Session - 03/02/14 1555    Visit Number 11   Number of Visits 18   Date for OT Re-Evaluation 03/01/14   Authorization Type BCBS   OT Start Time 1342   OT Stop Time 1428   OT Time Calculation (min) 46 min   Activity Tolerance Patient tolerated treatment well   Behavior During Therapy WFL for tasks assessed/performed      Past Medical History  Diagnosis Date  . Diabetes mellitus   . Sleep apnea   . Overweight(278.02)   . Hypertension   . Stroke     Past Surgical History  Procedure Laterality Date  . Knee arthroscopy    . Nasal septum surgery    . Cardiac catheterization    . Back surgery      There were no vitals taken for this visit.  Visit Diagnosis:  Decreased range of motion of shoulder, right  Pain in joint, shoulder region, right      Subjective Assessment - 03/02/14 1341    Symptoms "I can move it better. I haven't tried a lot with it yet."   Limitations progress as tolerated   Currently in Pain? No/denies  just tightness          [facility] OT Assessment - 03/02/14 1344    Assessment   Diagnosis right rotator cuff syndrome   Precautions   Precautions None   Prior Function   Level of Independence Independent with basic ADLs;Independent with homemaking with ambulation   Vocation Retired;Part time employment   Vocation Requirements driver's ed teacher   Leisure sports, attending sporting events, gong to the gym   ADL   ADL comments Pt reports decreased drops during the day, and improvemetns overall in reaching ot teh back of his ehad to wash his hair.  Pt indicates staying busy with reaching christmas taks in the [employer] today."   Observation/Other  Assessments   Observations pt has minimal fascial restriction sin RUE upper arm. Trace restrictions in bicep, upper trap and scap regions.   Focus on Therapeutic Outcomes (FOTO)  FOTO 49/100   AROM   Overall AROM Comments Assessed in sitting, with ER/IR adducted  Previous Eval Measuremetns 11/24   Right Shoulder Flexion 140 Degrees  83   Right Shoulder ABduction 113 Degrees  79   Right Shoulder Internal Rotation 101 Degrees  88   Right Shoulder External Rotation 51 Degrees  40   PROM   Overall PROM Comments Assessed in supine, with ER/IR adducted (Previous Eval 11/24)   Right Shoulder Flexion 147 Degrees  137   Right Shoulder ABduction 168 Degrees  99   Right Shoulder Internal Rotation 90 Degrees  90   Right Shoulder External Rotation 34 Degrees  26   Strength   Right Shoulder Flexion 4/5   Right Shoulder ABduction 4/5   Right Shoulder Internal Rotation 4/5   Right Shoulder External Rotation 4/5               OT Treatments/Exercises (OP) - 03/02/14 1344    Shoulder Exercises: Supine   Protraction PROM;5 reps;Strengthening;10 reps   Protraction Weight (lbs) 2   Horizontal ABduction PROM;5 reps;Strengthening;10 reps   Horizontal ABduction Weight (lbs)  2   External Rotation PROM;5 reps;Strengthening;10 reps   External Rotation Weight (lbs) 2   Internal Rotation PROM;5 reps;Strengthening;10 reps   Internal Rotation Weight (lbs) 2   Flexion AROM;5 reps;Strengthening;10 reps   Shoulder Flexion Weight (lbs) 2   ABduction PROM;5 reps;Right;10 reps   Shoulder ABduction Weight (lbs) 2   Shoulder Exercises: ROM/Strengthening   UBE (Upper Arm Bike) Level 2, 2 min forward and back   Wall Wash 2'   "W" Arms 10x   X to V Arms 10x   Manual Therapy   Manual Therapy Myofascial release   Myofascial Release Myofascial release and manual stretching to right upper arm, shoulder, scapular region and associated areas to decrease pain and fascial restrictions and improve pain free  mobility in his right shoulder region                    OT Short Term Goals - 03/02/14 1411    OT SHORT TERM GOAL #1   Title pt will be educated on HEP   Status Achieved   OT SHORT TERM GOAL #2   Title Pt will imporve PROM in RUE to [facility] for imporved abiltiy to reach back of head   Status Partially Met   OT SHORT TERM GOAL #3   Title Pt will decrease pain with reaching tasks to less than 5/10   Status On-going   OT SHORT TERM GOAL #4   Title Pt will increase RUE strength to 3+/5 for improved ability to reach into cabinets   Status Achieved   OT SHORT TERM GOAL #5   Title Pt will decrase fascail restrictions to minimal in RUE for decrease pain with motion   Status Achieved           OT Long Term Goals - 03/02/14 1550    OT LONG TERM GOAL #1   Title pt will acheive highest level of functioning in all ADL, IADL, work, and leisure activities   Time 8   Period Weeks   Status On-going   OT LONG TERM GOAL #2   Title Pt will achieve AROM in RUE WFL for improved ability to reach the back of his head   Time 8   Period Weeks   Status On-going   OT LONG TERM GOAL #3   Title pt will have pain of less than 2/10 in RUE with reaching tasks    Time 8   Period Weeks   Status On-going   OT LONG TERM GOAL #4   Title Pt will decreased fascial restirctions to less than minimal, for decreased pain with motion.   Time 8   Period Weeks   Status On-going   OT LONG TERM GOAL #5   Title pt will have strength in RUE of atleast 4+/5 for improved ability to reach into cabinets.   Time 8   Period Weeks   Status On-going               Plan - 03/02/14 1559    Clinical Impression Statement Re-evaluation completed this session. Pt has met 3/5 STG and is progressing well towards remaining 2/5 STG and 5/5 LTG.  Added 2# weights in supine this session, and pt tolerated well.   OT Frequency 3x / week   OT Duration 6 weeks   OT Treatment/Interventions Self-care/ADL  training;Patient/family education;Therapeutic exercise;Manual Therapy;Therapeutic exercises;Therapeutic activities;Passive range of motion;Electrical Stimulation;Ultrasound   Plan Continue OT services for an aditional month.  REsume therapeutic Exercises next session.  Add prone AROM.        Problem List Patient Active Problem List   Diagnosis Date Noted  . DDD (degenerative disc disease), 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/2013  . Difficulty in walking(719.7) 01/25/2013  . CVA (cerebral infarction) 01/22/2013  . Lack of coordination 01/22/2013  . OA (osteoarthritis) of knee 09/05/2011  . Knee pain, right 09/05/2011  . KNEE, ARTHRITIS, DEGEN./OSTEO 01/30/2009  . SHOULDER PAIN 01/16/2009  . KNEE PAIN 01/16/2009  . IMPINGEMENT SYNDROME 01/16/2009  . TENDINITIS, LEFT KNEE 01/16/2009    Bea Graff Philmore Lepore, MS, OTR/L [place] [phone] 03/02/2014, 4:07 PM   [facility] [address] [place], [place], [address] Phone: [phone]   Fax:  [phone]

## 2014-03-07 ENCOUNTER — Ambulatory Visit (HOSPITAL_COMMUNITY)
Admission: RE | Admit: 2014-03-07 | Discharge: 2014-03-07 | Disposition: A | Discharge status: Home / Self Care | Payer: BC Managed Care – PPO | Source: Ambulatory Visit | Attending: Pulmonary Disease | Admitting: Pulmonary Disease

## 2014-03-07 DIAGNOSIS — Z5189 Encounter for other specified aftercare: Secondary | ICD-10-CM | POA: Diagnosis not present

## 2014-03-07 DIAGNOSIS — M25511 Pain in right shoulder: Secondary | ICD-10-CM

## 2014-03-07 DIAGNOSIS — M25611 Stiffness of right shoulder, not elsewhere classified: Secondary | ICD-10-CM

## 2014-03-07 NOTE — Therapy (Signed)
[facility] [address] [place], [place], [address] Phone: [phone]   Fax:  [phone]  Occupational Therapy Treatment  Patient Details  Name: Zachary Murillo. MRN: [id number] Date of Birth: 1950/03/25  Encounter Date: 03/07/2014      OT End of Session - 03/07/14 0939    Visit Number 12   Number of Visits 18   Date for OT Re-Evaluation 03/30/14   Authorization Type BCBS   OT Start Time 0851   OT Stop Time 0938   OT Time Calculation (min) 47 min   Activity Tolerance Patient tolerated treatment well   Behavior During Therapy [facility] for tasks assessed/performed      Past Medical History  Diagnosis Date  . Diabetes mellitus   . Sleep apnea   . Overweight(278.02)   . Hypertension   . Stroke     Past Surgical History  Procedure Laterality Date  . Knee arthroscopy    . Nasal septum surgery    . Cardiac catheterization    . Back surgery      There were no vitals taken for this visit.  Visit Diagnosis:  Decreased range of motion of shoulder, right  Pain in joint, shoulder region, right      Subjective Assessment - 03/07/14 0916    Symptoms S:  Oh that got me (abduction)   Currently in Pain? Yes   Pain Score 1    Pain Location Shoulder   Pain Orientation Right   Pain Descriptors / Indicators Aching          [facility] OT Assessment - 03/07/14 0001    Precautions   Precautions None               OT Treatments/Exercises (OP) - 03/07/14 0917    Exercises   Exercises Shoulder   Shoulder Exercises: Supine   Protraction PROM;5 reps;Strengthening;10 reps   Protraction Weight (lbs) 2   Horizontal ABduction PROM;5 reps;Strengthening;10 reps   Horizontal ABduction Weight (lbs) 2   External Rotation PROM;5 reps;Strengthening;10 reps   External Rotation Weight (lbs) 2   Internal Rotation PROM;5 reps;Strengthening;10 reps   Flexion PROM;5 reps;Strengthening;10 reps   Shoulder Flexion Weight (lbs) 2   ABduction PROM;5 reps;Right;10 reps   Shoulder ABduction Weight (lbs) 2   Shoulder Exercises: Seated   Protraction Strengthening;10 reps   Protraction Weight (lbs) 1   Horizontal ABduction Strengthening;10 reps   Horizontal ABduction Weight (lbs) 1   External Rotation Strengthening;10 reps   External Rotation Weight (lbs) 1   Internal Rotation Strengthening;10 reps   Internal Rotation Weight (lbs) 1   Flexion Strengthening;10 reps   Flexion Weight (lbs) 1   Abduction Strengthening;10 reps   ABduction Weight (lbs) 1   Shoulder Exercises: Prone   Retraction AROM;10 reps  standing with bed elevated leaning over for all prone exerci   Flexion AROM;10 reps   Extension AROM;10 reps   Horizontal ABduction 1 AROM;10 reps   Horizontal ABduction 2 AROM;10 reps   Shoulder Exercises: ROM/Strengthening   UBE (Upper Arm Bike) level 2 3 minutes forward and 3 minutes in reverse   Proximal Shoulder Strengthening, Supine 10 times with 2# without resting   Proximal Shoulder Strengthening, Seated 10 X each with 1# resistance without resting   Manual Therapy   Manual Therapy Myofascial release   Myofascial Release Myofascial release and manual stretching to right upper arm, shoulder, scapular region and associated areas to decrease pain and fascial restrictions and improve pain  free mobility in his right shoulder region                  OT Short Term Goals - 03/02/14 1411    OT SHORT TERM GOAL #1   Title pt will be educated on HEP   Status Achieved   OT SHORT TERM GOAL #2   Title Pt will imporve PROM in RUE to [facility] for imporved abiltiy to reach back of head   Status Partially Met   OT SHORT TERM GOAL #3   Title Pt will decrease pain with reaching tasks to less than 5/10   Status On-going   OT SHORT TERM GOAL #4   Title Pt will increase RUE strength to 3+/5 for improved ability to reach into cabinets   Status Achieved   OT SHORT TERM GOAL #5   Title Pt will decrase fascail  restrictions to minimal in RUE for decrease pain with motion   Status Achieved           OT Long Term Goals - 03/02/14 1550    OT LONG TERM GOAL #1   Title pt will acheive highest level of functioning in all ADL, IADL, work, and leisure activities   Time 8   Period Weeks   Status On-going   OT LONG TERM GOAL #2   Title Pt will achieve AROM in RUE WFL for improved ability to reach the back of his head   Time 8   Period Weeks   Status On-going   OT LONG TERM GOAL #3   Title pt will have pain of less than 2/10 in RUE with reaching tasks    Time 8   Period Weeks   Status On-going   OT LONG TERM GOAL #4   Title Pt will decreased fascial restirctions to less than minimal, for decreased pain with motion.   Time 8   Period Weeks   Status On-going   OT LONG TERM GOAL #5   Title pt will have strength in RUE of atleast 4+/5 for improved ability to reach into cabinets.   Time 8   Period Weeks   Status On-going               Plan - 03/07/14 3818    Clinical Impression Statement A:  Patient able to complete prone AROM in modified position by leaning forward while supporting self on elevated mat table.  Added 1# to seated exercises with minimal difficulty.   Plan P:  Attempt overhead lace.  Increase repetitions iwth prone AROM.        Problem List Patient Active Problem List   Diagnosis Date Noted  . DDD (degenerative disc disease), 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/2013  . Difficulty in walking(719.7) 01/25/2013  . CVA (cerebral infarction) 01/22/2013  . Lack of coordination 01/22/2013  . OA (osteoarthritis) of knee 09/05/2011  . Knee pain, right 09/05/2011  . KNEE, ARTHRITIS, DEGEN./OSTEO 01/30/2009  . SHOULDER PAIN 01/16/2009  . KNEE PAIN 01/16/2009  . IMPINGEMENT SYNDROME 01/16/2009  . TENDINITIS, LEFT KNEE 01/16/2009    Vangie Bicker, OTR/L [phone]  03/07/2014, 9:41 AM  [facility] [address] [place], [place],  [address] Phone: [phone]   Fax:  [phone]

## 2014-03-09 ENCOUNTER — Encounter (HOSPITAL_COMMUNITY): Payer: BC Managed Care – PPO | Admitting: Specialist

## 2014-03-10 ENCOUNTER — Encounter (HOSPITAL_COMMUNITY): Payer: BC Managed Care – PPO | Admitting: Specialist

## 2014-03-14 ENCOUNTER — Ambulatory Visit (HOSPITAL_COMMUNITY)
Admission: RE | Admit: 2014-03-14 | Discharge: 2014-03-14 | Disposition: A | Discharge status: Home / Self Care | Payer: BC Managed Care – PPO | Source: Ambulatory Visit | Attending: Pulmonary Disease | Admitting: Pulmonary Disease

## 2014-03-14 DIAGNOSIS — M25511 Pain in right shoulder: Secondary | ICD-10-CM | POA: Diagnosis not present

## 2014-03-14 DIAGNOSIS — I10 Essential (primary) hypertension: Secondary | ICD-10-CM | POA: Diagnosis not present

## 2014-03-14 DIAGNOSIS — Z5189 Encounter for other specified aftercare: Secondary | ICD-10-CM | POA: Insufficient documentation

## 2014-03-14 DIAGNOSIS — E119 Type 2 diabetes mellitus without complications: Secondary | ICD-10-CM | POA: Diagnosis not present

## 2014-03-14 DIAGNOSIS — M25611 Stiffness of right shoulder, not elsewhere classified: Secondary | ICD-10-CM

## 2014-03-14 NOTE — Therapy (Signed)
[facility] [address] [place], [place], [address] Phone: [phone]   Fax:  [phone]  Occupational Therapy Treatment  Patient Details  Name: Zachary Murillo. MRN: [id number] Date of Birth: 07/25/1950  Encounter Date: 03/14/2014      OT End of Session - 03/14/14 1602    Activity Tolerance Patient tolerated treatment well   Behavior During Therapy [facility] for tasks assessed/performed      Past Medical History  Diagnosis Date  . Diabetes mellitus   . Sleep apnea   . Overweight(278.02)   . Hypertension   . Stroke     Past Surgical History  Procedure Laterality Date  . Knee arthroscopy    . Nasal septum surgery    . Cardiac catheterization    . Back surgery      There were no vitals taken for this visit.  Visit Diagnosis:  Decreased range of motion of shoulder, right  Pain in joint, shoulder region, right      Subjective Assessment - 03/14/14 1546    Symptoms S:  My arm is sore today from putting up decorations.    Limitations progress as tolerated   Currently in Pain? Yes   Pain Score 5    Pain Location Shoulder   Pain Orientation Right   Pain Descriptors / Indicators Aching   Pain Type Chronic pain          [facility] OT Assessment - 03/14/14 0001    Precautions   Precautions None               OT Treatments/Exercises (OP) - 03/14/14 1545    Exercises   Exercises Shoulder   Shoulder Exercises: Supine   Protraction PROM;5 reps;Strengthening;15 reps   Protraction Weight (lbs) 2   Horizontal ABduction PROM;10 reps;Strengthening;15 reps   Horizontal ABduction Weight (lbs) 2   External Rotation PROM;5 reps;Strengthening;15 reps   External Rotation Weight (lbs) 2   Internal Rotation PROM;5 reps;Strengthening;15 reps   Internal Rotation Weight (lbs) 2   Flexion PROM;5 reps;Strengthening;15 reps   Shoulder Flexion Weight (lbs) 2   ABduction PROM;5 reps;Strengthening;15 reps   Shoulder ABduction Weight  (lbs) 2   Shoulder Exercises: Prone   Retraction AROM;15 reps   Flexion AROM;15 reps   Extension AROM;15 reps   Horizontal ABduction 1 AROM;15 reps   Horizontal ABduction 2 AROM;15 reps  difficult and painful   Shoulder Exercises: ROM/Strengthening   UBE (Upper Arm Bike) level 2 3 minutes forward and 3 minutes in reverse   Over Head Lace 2 minutes without resistance   Proximal Shoulder Strengthening, Supine 10 times with 2# without resting        Myofascial release and manual stretching to right upper arm, shoulder, scapular region and associated areas to decrease pain and fascial restrictions and improve pain free mobility in his right shoulder regionMyofascial release and manual stretching to right upper arm, shoulder, scapular region and associated areas to decrease pain and fascial restrictions and improve pain free mobility in his right shoulder region           OT Short Term Goals - 03/02/14 1411    OT SHORT TERM GOAL #1   Title pt will be educated on HEP   Status Achieved   OT SHORT TERM GOAL #2   Title Pt will imporve PROM in RUE to [facility] for imporved abiltiy to reach back of head   Status Partially Met   OT SHORT TERM GOAL #3  Title Pt will decrease pain with reaching tasks to less than 5/10   Status On-going   OT SHORT TERM GOAL #4   Title Pt will increase RUE strength to 3+/5 for improved ability to reach into cabinets   Status Achieved   OT SHORT TERM GOAL #5   Title Pt will decrase fascail restrictions to minimal in RUE for decrease pain with motion   Status Achieved           OT Long Term Goals - 03/02/14 1550    OT LONG TERM GOAL #1   Title pt will acheive highest level of functioning in all ADL, IADL, work, and leisure activities   Time 8   Period Weeks   Status On-going   OT LONG TERM GOAL #2   Title Pt will achieve AROM in RUE WFL for improved ability to reach the back of his head   Time 8   Period Weeks   Status On-going   OT LONG TERM GOAL  #3   Title pt will have pain of less than 2/10 in RUE with reaching tasks    Time 8   Period Weeks   Status On-going   OT LONG TERM GOAL #4   Title Pt will decreased fascial restirctions to less than minimal, for decreased pain with motion.   Time 8   Period Weeks   Status On-going   OT LONG TERM GOAL #5   Title pt will have strength in RUE of atleast 4+/5 for improved ability to reach into cabinets.   Time 8   Period Weeks   Status On-going               Plan - 03/14/14 1550    Clinical Impression Statement A:   Added overhead lace and increased repetitions with prone AROM this date. Difficulty with horizontal abduction with small digit leading.   Plan P:  Decrease amount of pain with abduction and flexion from 90-120. Add ball on wall.         Problem List Patient Active Problem List   Diagnosis Date Noted  . DDD (degenerative disc disease), 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/2013  . Difficulty in walking(719.7) 01/25/2013  . CVA (cerebral infarction) 01/22/2013  . Lack of coordination 01/22/2013  . OA (osteoarthritis) of knee 09/05/2011  . Knee pain, right 09/05/2011  . KNEE, ARTHRITIS, DEGEN./OSTEO 01/30/2009  . SHOULDER PAIN 01/16/2009  . KNEE PAIN 01/16/2009  . IMPINGEMENT SYNDROME 01/16/2009  . TENDINITIS, LEFT KNEE 01/16/2009    Vangie Bicker, OTR/L [phone]  03/14/2014, 4:09 PM  [facility] [address] [place], [place], [address] Phone: [phone]   Fax:  [phone]

## 2014-03-16 ENCOUNTER — Ambulatory Visit (HOSPITAL_COMMUNITY)
Admission: RE | Admit: 2014-03-16 | Discharge: 2014-03-16 | Disposition: A | Discharge status: Home / Self Care | Payer: BC Managed Care – PPO | Source: Ambulatory Visit | Attending: Pulmonary Disease | Admitting: Pulmonary Disease

## 2014-03-16 DIAGNOSIS — M25511 Pain in right shoulder: Secondary | ICD-10-CM

## 2014-03-16 DIAGNOSIS — M25611 Stiffness of right shoulder, not elsewhere classified: Secondary | ICD-10-CM

## 2014-03-16 DIAGNOSIS — Z5189 Encounter for other specified aftercare: Secondary | ICD-10-CM | POA: Diagnosis not present

## 2014-03-16 NOTE — Therapy (Signed)
[facility] [address] [place], [place], [address] Phone: [phone]   Fax:  [phone]  Occupational Therapy Treatment  Patient Details  Name: Zachary Murillo. MRN: [id number] Date of Birth: [birth year] Referring Provider:  Alonza Bogus, MD  Encounter Date: 03/16/2014      OT End of Session - 03/16/14 1529    Visit Number 14   Number of Visits 18   Date for OT Re-Evaluation 03/30/14   Authorization Type BCBS   OT Start Time 7544   OT Stop Time 1524   OT Time Calculation (min) 50 min   Activity Tolerance Patient tolerated treatment well      Past Medical History  Diagnosis Date  . Diabetes mellitus   . Sleep apnea   . Overweight(278.02)   . Hypertension   . Stroke     Past Surgical History  Procedure Laterality Date  . Knee arthroscopy    . Nasal septum surgery    . Cardiac catheterization    . Back surgery      There were no vitals taken for this visit.  Visit Diagnosis:  Decreased range of motion of shoulder, right  Pain in joint, shoulder region, right      Subjective Assessment - 03/16/14 1435    Symptoms S:  That was a tough workout the other day.   Currently in Pain? Yes   Pain Score 5    Pain Location Shoulder   Pain Orientation Right   Pain Descriptors / Indicators Aching   Pain Type Chronic pain                 OT Treatments/Exercises (OP) - 03/16/14 1436    Exercises   Exercises Shoulder   Shoulder Exercises: Supine   Protraction PROM;5 reps;Strengthening;15 reps   Horizontal ABduction PROM;10 reps;Strengthening;15 reps   Horizontal ABduction Weight (lbs) 2   External Rotation PROM;5 reps;Strengthening;15 reps   External Rotation Weight (lbs) 2   Internal Rotation PROM;5 reps;Strengthening;15 reps   Internal Rotation Weight (lbs) 2   Flexion PROM;5 reps;Strengthening;15 reps   Shoulder Flexion Weight (lbs) 2   ABduction PROM;5 reps;Strengthening;15 reps   Shoulder  ABduction Weight (lbs) 2   Shoulder Exercises: Prone   Retraction AROM;15 reps   Flexion AROM;15 reps   Extension AROM;15 reps   Horizontal ABduction 1 AROM;15 reps   Horizontal ABduction 2 AROM;15 reps   Shoulder Exercises: ROM/Strengthening   Over Head Lace 2 minutes without resistance   Proximal Shoulder Strengthening, Supine 10 times with 2# without resting   Ball on Wall 1' with shoulder flexed to 90 for 1 minute, attempted with shoulder abducted and was unable to do so   Other ROM/Strengthening Exercises walked fingers up wall in abducted position and then lifted arm off wall   Manual Therapy   Manual Therapy Myofascial release   Myofascial Release Myofascial release and manual stretching to right upper arm, shoulder, scapular region and associated areas to decrease pain and fascial restrictions and improve pain free mobility in his right shoulder region                  OT Short Term Goals - 03/02/14 1411    OT SHORT TERM GOAL #1   Title pt will be educated on HEP   Status Achieved   OT SHORT TERM GOAL #2   Title Pt will imporve PROM in RUE to [facility] for imporved abiltiy to reach back of head  Status Partially Met   OT SHORT TERM GOAL #3   Title Pt will decrease pain with reaching tasks to less than 5/10   Status On-going   OT SHORT TERM GOAL #4   Title Pt will increase RUE strength to 3+/5 for improved ability to reach into cabinets   Status Achieved   OT SHORT TERM GOAL #5   Title Pt will decrase fascail restrictions to minimal in RUE for decrease pain with motion   Status Achieved           OT Long Term Goals - 03/02/14 1550    OT LONG TERM GOAL #1   Title pt will acheive highest level of functioning in all ADL, IADL, work, and leisure activities   Time 8   Period Weeks   Status On-going   OT LONG TERM GOAL #2   Title Pt will achieve AROM in RUE WFL for improved ability to reach the back of his head   Time 8   Period Weeks   Status On-going   OT  LONG TERM GOAL #3   Title pt will have pain of less than 2/10 in RUE with reaching tasks    Time 8   Period Weeks   Status On-going   OT LONG TERM GOAL #4   Title Pt will decreased fascial restirctions to less than minimal, for decreased pain with motion.   Time 8   Period Weeks   Status On-going   OT LONG TERM GOAL #5   Title pt will have strength in RUE of atleast 4+/5 for improved ability to reach into cabinets.   Time 8   Period Weeks   Status On-going               Plan - 03/16/14 1504    Clinical Impression Statement A:  added ball on wall in flexion and abduction for 1 minute, less pain with PROM of abduction 90-120.   Plan P:  Attempt weight in prone - 1#.        Problem List Patient Active Problem List   Diagnosis Date Noted  . DDD (degenerative disc disease), 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/2013  . Difficulty in walking(719.7) 01/25/2013  . CVA (cerebral infarction) 01/22/2013  . Lack of coordination 01/22/2013  . OA (osteoarthritis) of knee 09/05/2011  . Knee pain, right 09/05/2011  . KNEE, ARTHRITIS, DEGEN./OSTEO 01/30/2009  . SHOULDER PAIN 01/16/2009  . KNEE PAIN 01/16/2009  . IMPINGEMENT SYNDROME 01/16/2009  . TENDINITIS, LEFT KNEE 01/16/2009    Vangie Bicker, OTR/L [phone]  03/16/2014, 3:30 PM  [facility] [address] [place], [place], [address] Phone: [phone]   Fax:  [phone]

## 2014-03-18 ENCOUNTER — Ambulatory Visit (HOSPITAL_COMMUNITY)
Admission: RE | Admit: 2014-03-18 | Discharge: 2014-03-18 | Disposition: A | Discharge status: Home / Self Care | Payer: BC Managed Care – PPO | Source: Ambulatory Visit | Attending: Pulmonary Disease | Admitting: Pulmonary Disease

## 2014-03-18 ENCOUNTER — Encounter (HOSPITAL_COMMUNITY): Payer: Self-pay

## 2014-03-18 DIAGNOSIS — M25511 Pain in right shoulder: Secondary | ICD-10-CM

## 2014-03-18 DIAGNOSIS — M25611 Stiffness of right shoulder, not elsewhere classified: Secondary | ICD-10-CM

## 2014-03-18 DIAGNOSIS — Z5189 Encounter for other specified aftercare: Secondary | ICD-10-CM | POA: Diagnosis not present

## 2014-03-18 NOTE — Therapy (Signed)
[facility] [address] [place], [place], [address] Phone: [phone]   Fax:  [phone]  Occupational Therapy Treatment  Patient Details  Name: Zachary C Michl Jr. MRN: [id number] Date of Birth: 07/30/1950 Referring Provider:  Hawkins, Edward L, MD  Encounter Date: 03/18/2014      OT End of Session - 03/18/14 1438    Visit Number 15   Number of Visits 18   Date for OT Re-Evaluation 03/30/14   Authorization Type BCBS   OT Start Time 1354   OT Stop Time 1433   OT Time Calculation (min) 39 min   Activity Tolerance Patient tolerated treatment well   Behavior During Therapy WFL for tasks assessed/performed      Past Medical History  Diagnosis Date  . Diabetes mellitus   . Sleep apnea   . Overweight(278.02)   . Hypertension   . Stroke     Past Surgical History  Procedure Laterality Date  . Knee arthroscopy    . Nasal septum surgery    . Cardiac catheterization    . Back surgery      There were no vitals taken for this visit.  Visit Diagnosis:  Pain in joint, shoulder region, right  Decreased range of motion of shoulder, right      Subjective Assessment - 03/18/14 1406    Symptoms S: I'm just stiff today.    Currently in Pain? No/denies          [facility] OT Assessment - 03/18/14 1407    Precautions   Precautions None               OT Treatments/Exercises (OP) - 03/18/14 1407    Shoulder Exercises: Supine   Protraction PROM;5 reps;Strengthening;15 reps   Protraction Weight (lbs) 2   Horizontal ABduction PROM;5 reps;Strengthening;15 reps   Horizontal ABduction Weight (lbs) 2   External Rotation PROM;5 reps;Strengthening;15 reps   External Rotation Weight (lbs) 2   Internal Rotation PROM;5 reps;Strengthening;15 reps   Internal Rotation Weight (lbs) 2   Flexion PROM;5 reps;Strengthening;15 reps   Shoulder Flexion Weight (lbs) 2   ABduction PROM;5 reps;Strengthening;15 reps   Shoulder ABduction Weight  (lbs) 2   Shoulder Exercises: Seated   Protraction Strengthening;15 reps   Protraction Weight (lbs) 1   Horizontal ABduction Strengthening;15 reps   Horizontal ABduction Weight (lbs) 1   External Rotation Strengthening;15 reps   External Rotation Weight (lbs) 1   Internal Rotation Strengthening;15 reps   Internal Rotation Weight (lbs) 1   Flexion Strengthening;15 reps   Flexion Weight (lbs) 1   Abduction Strengthening;15 reps   ABduction Weight (lbs) 1   Shoulder Exercises: Prone   Retraction Strengthening;15 reps;Weights   Retraction Weight (lbs) 1   Flexion Strengthening;15 reps;Weights   Flexion Weight (lbs) 1   Extension Strengthening;15 reps;Weights   Extension Weight (lbs) 1   Horizontal ABduction 1 Strengthening;15 reps;Weights   Horizontal ABduction 1 Weight (lbs) 1   Shoulder Exercises: ROM/Strengthening   UBE (Upper Arm Bike) level 2 3 minutes forward and 3 minutes in reverse   Proximal Shoulder Strengthening, Supine 15X with 2# no rest breaks   Proximal Shoulder Strengthening, Seated 15 X each with 1# resistance with rest breaks   Manual Therapy   Manual Therapy Myofascial release   Myofascial Release Muscle energy technique completed to anterior deltoid to relax tone and muscle spasm and improve ROM.                      OT Short Term Goals - 03/18/14 1441    OT SHORT TERM GOAL #1   Title pt will be educated on HEP   OT SHORT TERM GOAL #2   Title Pt will imporve PROM in RUE to WFL for imporved abiltiy to reach back of head   Status Partially Met   OT SHORT TERM GOAL #3   Title Pt will decrease pain with reaching tasks to less than 5/10   Status On-going   OT SHORT TERM GOAL #4   Title Pt will increase RUE strength to 3+/5 for improved ability to reach into cabinets   OT SHORT TERM GOAL #5   Title Pt will decrase fascail restrictions to minimal in RUE for decrease pain with motion           OT Long Term Goals - 03/18/14 1441    OT LONG TERM GOAL  #1   Title pt will acheive highest level of functioning in all ADL, IADL, work, and leisure activities   Status On-going   OT LONG TERM GOAL #2   Title Pt will achieve AROM in RUE WFL for improved ability to reach the back of his head   Status On-going   OT LONG TERM GOAL #3   Title pt will have pain of less than 2/10 in RUE with reaching tasks    Status On-going   OT LONG TERM GOAL #4   Title Pt will decreased fascial restirctions to less than minimal, for decreased pain with motion.   Status On-going   OT LONG TERM GOAL #5   Title pt will have strength in RUE of atleast 4+/5 for improved ability to reach into cabinets.   Status On-going               Plan - 03/18/14 1439    Clinical Impression Statement A: Added 1# handweight to prone exercises. patient tolerated well. Pt reported muscle fatigue when completed seated exercises.    Plan P: Cont work on increasing shoulder and scapular stabilzation strength. Completed X to V arms and W arms with 1# weight.        Problem List Patient Active Problem List   Diagnosis Date Noted  . DDD (degenerative disc disease), 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/2013  . Difficulty in walking(719.7) 01/25/2013  . CVA (cerebral infarction) 01/22/2013  . Lack of coordination 01/22/2013  . OA (osteoarthritis) of knee 09/05/2011  . Knee pain, right 09/05/2011  . KNEE, ARTHRITIS, DEGEN./OSTEO 01/30/2009  . SHOULDER PAIN 01/16/2009  . KNEE PAIN 01/16/2009  . IMPINGEMENT SYNDROME 01/16/2009  . TENDINITIS, LEFT KNEE 01/16/2009    Laura Essenmacher, OTR/L,CBIS  [phone]  03/18/2014, 2:42 PM  [facility] [address] [place], [place], [address] Phone: [phone]   Fax:  [phone]    

## 2014-03-21 ENCOUNTER — Ambulatory Visit (HOSPITAL_COMMUNITY)
Admission: RE | Admit: 2014-03-21 | Discharge: 2014-03-21 | Disposition: A | Discharge status: Home / Self Care | Payer: BC Managed Care – PPO | Source: Ambulatory Visit | Attending: Pulmonary Disease | Admitting: Pulmonary Disease

## 2014-03-21 ENCOUNTER — Encounter (HOSPITAL_COMMUNITY): Payer: Self-pay

## 2014-03-21 DIAGNOSIS — M25511 Pain in right shoulder: Secondary | ICD-10-CM

## 2014-03-21 DIAGNOSIS — Z5189 Encounter for other specified aftercare: Secondary | ICD-10-CM | POA: Diagnosis not present

## 2014-03-21 DIAGNOSIS — M25611 Stiffness of right shoulder, not elsewhere classified: Secondary | ICD-10-CM

## 2014-03-21 NOTE — Addendum Note (Signed)
Encounter addended by: Debby Bud, OT on: 03/21/2014  2:01 PM<BR>     Documentation filed: Clinical Notes, Inpatient Document Flowsheet

## 2014-03-21 NOTE — Therapy (Addendum)
[facility] [address] [place], [place], [address] Phone: [phone]   Fax:  [phone]  Occupational Therapy Treatment  Patient Details  Name: Zachary Murillo. MRN: [id number] Date of Birth: 16-Feb-1951 Referring Provider:  Alonza Bogus, MD  Encounter Date: 03/21/2014      OT End of Session - 03/21/14 1357    Visit Number 16   Number of Visits 18   Date for OT Re-Evaluation 03/30/14   Authorization Type BCBS   OT Start Time 1305   OT Stop Time 1345   OT Time Calculation (min) 40 min   Activity Tolerance Patient tolerated treatment well   Behavior During Therapy [facility] for tasks assessed/performed      Past Medical History  Diagnosis Date  . Diabetes mellitus   . Sleep apnea   . Overweight(278.02)   . Hypertension   . Stroke     Past Surgical History  Procedure Laterality Date  . Knee arthroscopy    . Nasal septum surgery    . Cardiac catheterization    . Back surgery      There were no vitals taken for this visit.  Visit Diagnosis:  Pain in joint, shoulder region, right  Decreased range of motion of shoulder, right      Subjective Assessment - 03/21/14 1324    Symptoms S: Today it's really stiff. I tried to stretch it out in the pool.   Currently in Pain? No/denies          [facility] OT Assessment - 03/21/14 1325    Precautions   Precautions None               OT Treatments/Exercises (OP) - 03/21/14 1325    Shoulder Exercises: Supine   Protraction PROM;5 reps;Strengthening;15 reps   Protraction Weight (lbs) 2   Horizontal ABduction PROM;5 reps;Strengthening;15 reps   Horizontal ABduction Weight (lbs) 2   External Rotation PROM;5 reps;Strengthening;15 reps   External Rotation Weight (lbs) 2   Internal Rotation PROM;5 reps;Strengthening;15 reps   Internal Rotation Weight (lbs) 2   Flexion PROM;5 reps;Strengthening;15 reps   Shoulder Flexion Weight (lbs) 2   ABduction PROM;5  reps;Strengthening;15 reps   Shoulder ABduction Weight (lbs) 2   Shoulder Exercises: Seated   Protraction Strengthening;15 reps   Protraction Weight (lbs) 1   Horizontal ABduction Strengthening;15 reps   Horizontal ABduction Weight (lbs) 1   External Rotation Strengthening;15 reps   External Rotation Weight (lbs) 1   Internal Rotation Strengthening;15 reps   Internal Rotation Weight (lbs) 1   Flexion Strengthening;15 reps   Flexion Weight (lbs) 1   Abduction Strengthening;15 reps   ABduction Weight (lbs) 1   Shoulder Exercises: Prone   Retraction Strengthening;15 reps;Weights   Retraction Weight (lbs) 1   Flexion Strengthening;15 reps;Weights   Flexion Weight (lbs) 1   Extension Strengthening;15 reps;Weights   Extension Weight (lbs) 1   Horizontal ABduction 1 Strengthening;15 reps;Weights   Horizontal ABduction 1 Weight (lbs) 1   Shoulder Exercises: ROM/Strengthening   X to V Arms 15X with 1#   Proximal Shoulder Strengthening, Supine 15X with 2# no rest breaks   Proximal Shoulder Strengthening, Seated 15X each with 1# resistance with rest breaks   Ball on Wall 1' flexion 30" abduction green   Manual Therapy   Manual Therapy Myofascial release   Myofascial Release Muscle energy technique completed to anterior deltoid to relax tone and muscle spasm and improve ROM.  OT Short Term Goals - 03/18/14 1441    OT SHORT TERM GOAL #1   Title pt will be educated on HEP   OT [place] #2   Title Pt will imporve PROM in RUE to [facility] for imporved abiltiy to reach back of head   Status Partially Met   OT SHORT TERM GOAL #3   Title Pt will decrease pain with reaching tasks to less than 5/10   Status On-going   OT SHORT TERM GOAL #4   Title Pt will increase RUE strength to 3+/5 for improved ability to reach into cabinets   OT SHORT TERM GOAL #5   Title Pt will decrase fascail restrictions to minimal in RUE for decrease pain with motion           OT  Long Term Goals - 03/18/14 1441    OT LONG TERM GOAL #1   Title pt will acheive highest level of functioning in all ADL, IADL, work, and leisure activities   Status On-going   OT [place] #2   Title Pt will achieve AROM in [place] WFL for improved ability to reach the back of his head   Status On-going   [place] #3   Title pt will have pain of less than 2/10 in RUE with reaching tasks    Status On-going   OT LONG TERM GOAL #4   Title Pt will decreased fascial restirctions to less than minimal, for decreased pain with motion.   Status On-going   OT LONG TERM GOAL #5   Title pt will have strength in RUE of atleast 4+/5 for improved ability to reach into cabinets.   Status On-going               Plan - 03/21/14 1357    Clinical Impression Statement A: Patient was able to complete ball on the wall in abduction for 30" this session. Increased tenderness palpated in upper traps this session.    Plan P: Increase wall on the ball abduction in 45" to work on increasing shoulder and scapular stability. Add Cybex row and press.         Problem List Patient Active Problem List   Diagnosis Date Noted  . DDD (degenerative disc disease), 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/2013  . Difficulty in walking(719.7) 01/25/2013  . CVA (cerebral infarction) 01/22/2013  . Lack of coordination 01/22/2013  . OA (osteoarthritis) of knee 09/05/2011  . Knee pain, right 09/05/2011  . KNEE, ARTHRITIS, DEGEN./OSTEO 01/30/2009  . SHOULDER PAIN 01/16/2009  . KNEE PAIN 01/16/2009  . IMPINGEMENT SYNDROME 01/16/2009  . TENDINITIS, LEFT KNEE 01/16/2009    Ailene Ravel, OTR/L,CBIS  [phone]  03/21/2014, 2:01 PM  [facility] [address] [place], [place], [address] Phone: [phone]   Fax:  [phone]

## 2014-03-23 ENCOUNTER — Ambulatory Visit (HOSPITAL_COMMUNITY)
Admission: RE | Admit: 2014-03-23 | Discharge: 2014-03-23 | Disposition: A | Discharge status: Home / Self Care | Payer: BC Managed Care – PPO | Source: Ambulatory Visit | Attending: Orthopedic Surgery | Admitting: Orthopedic Surgery

## 2014-03-23 DIAGNOSIS — M25511 Pain in right shoulder: Secondary | ICD-10-CM

## 2014-03-23 DIAGNOSIS — Z5189 Encounter for other specified aftercare: Secondary | ICD-10-CM | POA: Diagnosis not present

## 2014-03-23 DIAGNOSIS — M25611 Stiffness of right shoulder, not elsewhere classified: Secondary | ICD-10-CM

## 2014-03-23 NOTE — Therapy (Signed)
[facility] [address] [place], [place], [address] Phone: [phone]   Fax:  [phone]  Occupational Therapy Treatment  Patient Details  Name: Zachary Murillo. MRN: [id number] Date of Birth: 1950-09-16 Referring Provider:  Carole Civil, MD  Encounter Date: 03/23/2014      OT End of Session - 03/23/14 1425    Visit Number 17   Number of Visits 18   Date for OT Re-Evaluation 03/30/14   Authorization Type BCBS   OT Start Time 1355   OT Stop Time 1431   OT Time Calculation (min) 36 min   Activity Tolerance Patient tolerated treatment well   Behavior During Therapy [facility] for tasks assessed/performed      Past Medical History  Diagnosis Date  . Diabetes mellitus   . Sleep apnea   . Overweight(278.02)   . Hypertension   . Stroke     Past Surgical History  Procedure Laterality Date  . Knee arthroscopy    . Nasal septum surgery    . Cardiac catheterization    . Back surgery      There were no vitals taken for this visit.  Visit Diagnosis:  Pain in joint, shoulder region, right  Decreased range of motion of shoulder, right      Subjective Assessment - 03/23/14 1356    Symptoms S:  My shoulder is sore from working out in the pool this am.   Limitations progress as tolerated   Currently in Pain? Yes   Pain Score 1    Pain Location Shoulder   Pain Orientation Right   Pain Descriptors / Indicators Sore   Pain Type Chronic pain          [facility] OT Assessment - 03/23/14 1357    Assessment   Diagnosis right rotator cuff syndrome   Precautions   Precautions None               OT Treatments/Exercises (OP) - 03/23/14 0001    Shoulder Exercises: Supine   Protraction PROM;5 reps;Strengthening;10 reps   Protraction Weight (lbs) 3   Horizontal ABduction PROM;5 reps;Strengthening;10 reps   Horizontal ABduction Weight (lbs) 3   External Rotation PROM;5 reps;Strengthening;10 reps   External Rotation  Weight (lbs) 3   Internal Rotation PROM;5 reps;Strengthening;10 reps   Internal Rotation Weight (lbs) 3   Shoulder Exercises: Seated   Protraction Strengthening;10 reps   Protraction Weight (lbs) 2   Horizontal ABduction Strengthening;10 reps   Horizontal ABduction Weight (lbs) 2   External Rotation Strengthening;10 reps   External Rotation Weight (lbs) 2   Shoulder Exercises: ROM/Strengthening   UBE (Upper Arm Bike) level 3 3' forward and 3' reverse                  OT Short Term Goals - 03/18/14 1441    OT SHORT TERM GOAL #1   Title pt will be educated on HEP   OT SHORT TERM GOAL #2   Title Pt will imporve PROM in RUE to [facility] for imporved abiltiy to reach back of head   Status Partially Met   OT SHORT TERM GOAL #3   Title Pt will decrease pain with reaching tasks to less than 5/10   Status On-going   OT SHORT TERM GOAL #4   Title Pt will increase RUE strength to 3+/5 for improved ability to reach into cabinets   OT SHORT TERM GOAL #5   Title Pt will decrase fascail restrictions  to minimal in RUE for decrease pain with motion           OT Long Term Goals - 03/18/14 1441    OT LONG TERM GOAL #1   Title pt will acheive highest level of functioning in all ADL, IADL, work, and leisure activities   Status On-going   OT [place] #2   Title Pt will achieve AROM in RUE WFL for improved ability to reach the back of his head   Status On-going   [place] #3   Title pt will have pain of less than 2/10 in RUE with reaching tasks    Status On-going   OT LONG TERM GOAL #4   Title Pt will decreased fascial restirctions to less than minimal, for decreased pain with motion.   Status On-going   OT LONG TERM GOAL #5   Title pt will have strength in RUE of atleast 4+/5 for improved ability to reach into cabinets.   Status On-going               Plan - 03/23/14 1426    Clinical Impression Statement A:  Increased to 3# supine strengthening and 2# seated  strengthening this date. Increased resistance to 3.0 on UBE. Completed abduction with elbow flexed to 90 and stopping at 90 degrees abduction to keep pain level down.    Plan P;  Increase ball on wall in abduction to 45" keep arm abducted at less than 90 degrees for pain control.  Add cybex row and press.        Problem List Patient Active Problem List   Diagnosis Date Noted  . DDD (degenerative disc disease), 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/2013  . Difficulty in walking(719.7) 01/25/2013  . CVA (cerebral infarction) 01/22/2013  . Lack of coordination 01/22/2013  . OA (osteoarthritis) of knee 09/05/2011  . Knee pain, right 09/05/2011  . KNEE, ARTHRITIS, DEGEN./OSTEO 01/30/2009  . SHOULDER PAIN 01/16/2009  . KNEE PAIN 01/16/2009  . IMPINGEMENT SYNDROME 01/16/2009  . TENDINITIS, LEFT KNEE 01/16/2009    Vangie Bicker, OTR/L [phone]  03/23/2014, 2:30 PM  [facility] [place], [place], [address] Phone: [phone]   Fax:  [phone]

## 2014-03-24 ENCOUNTER — Ambulatory Visit (HOSPITAL_COMMUNITY): Payer: BC Managed Care – PPO

## 2014-03-25 ENCOUNTER — Ambulatory Visit (HOSPITAL_COMMUNITY)
Admission: RE | Admit: 2014-03-25 | Discharge: 2014-03-25 | Disposition: A | Discharge status: Home / Self Care | Payer: BC Managed Care – PPO | Source: Ambulatory Visit | Attending: Orthopedic Surgery | Admitting: Orthopedic Surgery

## 2014-03-25 DIAGNOSIS — M25511 Pain in right shoulder: Secondary | ICD-10-CM

## 2014-03-25 DIAGNOSIS — M25611 Stiffness of right shoulder, not elsewhere classified: Secondary | ICD-10-CM

## 2014-03-25 DIAGNOSIS — Z5189 Encounter for other specified aftercare: Secondary | ICD-10-CM | POA: Diagnosis not present

## 2014-03-25 NOTE — Therapy (Signed)
[facility] [address] [place], [place], [address] Phone: [phone]   Fax:  [phone]  Occupational Therapy Treatment  Patient Details  Name: Zachary Murillo. MRN: [id number] Date of Birth: 1950-10-18 Referring Provider:  Carole Civil, MD  Encounter Date: 03/25/2014      OT End of Session - 03/25/14 1010    Visit Number 18   Number of Visits 18   Date for OT Re-Evaluation 03/30/14   Authorization Type BCBS   OT Start Time 0933   OT Stop Time 1016   OT Time Calculation (min) 43 min   Activity Tolerance Patient tolerated treatment well   Behavior During Therapy [facility] for tasks assessed/performed      Past Medical History  Diagnosis Date  . Diabetes mellitus   . Sleep apnea   . Overweight(278.02)   . Hypertension   . Stroke     Past Surgical History  Procedure Laterality Date  . Knee arthroscopy    . Nasal septum surgery    . Cardiac catheterization    . Back surgery      There were no vitals taken for this visit.  Visit Diagnosis:  Pain in joint, shoulder region, right  Decreased range of motion of shoulder, right      Subjective Assessment - 03/25/14 0933    Symptoms S:  My shoulder feels ok right now.   Limitations progress as tolerated   Currently in Pain? No/denies          [facility] OT Assessment - 03/25/14 0954    Assessment   Diagnosis right rotator cuff syndrome   Precautions   Precautions None               OT Treatments/Exercises (OP) - 03/25/14 0946    Exercises   Exercises Shoulder   Shoulder Exercises: Supine   Protraction PROM;5 reps;Strengthening;10 reps   Protraction Weight (lbs) 3   Horizontal ABduction PROM;5 reps;Strengthening;10 reps   Horizontal ABduction Weight (lbs) 3   External Rotation PROM;5 reps;Strengthening;10 reps   External Rotation Weight (lbs) 3   Internal Rotation PROM;5 reps;Strengthening;10 reps   Internal Rotation Weight (lbs) 3   Flexion  PROM;5 reps;Strengthening;15 reps   Shoulder Flexion Weight (lbs) 3   ABduction PROM;5 reps;Strengthening;15 reps   Shoulder ABduction Weight (lbs) 3   Other Supine Exercises serratus anterior punches 3#   Shoulder Exercises: Seated   Protraction Strengthening;10 reps   Protraction Weight (lbs) 2   Horizontal ABduction Strengthening;10 reps   Horizontal ABduction Weight (lbs) 2   External Rotation Strengthening;10 reps   External Rotation Weight (lbs) 2   Internal Rotation Strengthening;10 reps   Internal Rotation Weight (lbs) 2   Flexion Strengthening;10 reps   Flexion Weight (lbs) 2   Abduction Strengthening;10 reps   ABduction Weight (lbs) 2   Shoulder Exercises: Prone   Retraction Strengthening;10 reps   Retraction Weight (lbs) 2   Extension Strengthening;10 reps   Extension Weight (lbs) 2   Horizontal ABduction 1 Strengthening;10 reps   Horizontal ABduction 1 Weight (lbs) 2   Horizontal ABduction 2 Strengthening;10 reps   Horizontal ABduction 2 Weight (lbs) 2   Shoulder Exercises: ROM/Strengthening   UBE (Upper Arm Bike) level 3 3' forward and 3' reverse   Over Head Lace 3 minutes    "W" Arms 5 times 2# with max difficulty   X to V Arms 10 times 2#   Proximal Shoulder Strengthening, Supine 15X with 2# no  rest breaks   Proximal Shoulder Strengthening, Seated 15X each with 1# resistance with rest breaks   Manual Therapy   Manual Therapy Myofascial release   Myofascial Release Myofascial release and manual stretching to right upper arm, shoulder, scapular region and associated areas to decrease pain and fascial restrictions and improve pain free mobility in his right shoulder region                  OT Short Term Goals - 03/18/14 1441    OT SHORT TERM GOAL #1   Title pt will be educated on HEP   OT [place] #2   Title Pt will imporve PROM in RUE to [facility] for imporved abiltiy to reach back of head   Status Partially Met   OT SHORT TERM GOAL #3   Title Pt  will decrease pain with reaching tasks to less than 5/10   Status On-going   OT SHORT TERM GOAL #4   Title Pt will increase RUE strength to 3+/5 for improved ability to reach into cabinets   OT SHORT TERM GOAL #5   Title Pt will decrase fascail restrictions to minimal in RUE for decrease pain with motion           OT Long Term Goals - 03/18/14 1441    OT LONG TERM GOAL #1   Title pt will acheive highest level of functioning in all ADL, IADL, work, and leisure activities   Status On-going   OT [place] #2   Title Pt will achieve AROM in RUE WFL for improved ability to reach the back of his head   Status On-going   OT LONG TERM GOAL #3   Title pt will have pain of less than 2/10 in RUE with reaching tasks    Status On-going   OT LONG TERM GOAL #4   Title Pt will decreased fascial restirctions to less than minimal, for decreased pain with motion.   Status On-going   OT LONG TERM GOAL #5   Title pt will have strength in RUE of atleast 4+/5 for improved ability to reach into cabinets.   Status On-going               Plan - 03/25/14 1011    Clinical Impression Statement A:  used 2# with prone exercises this date.    Plan P:  Reassess for recertification.        Problem List Patient Active Problem List   Diagnosis Date Noted  . DDD (degenerative disc disease), 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/2013  . Difficulty in walking(719.7) 01/25/2013  . CVA (cerebral infarction) 01/22/2013  . Lack of coordination 01/22/2013  . OA (osteoarthritis) of knee 09/05/2011  . Knee pain, right 09/05/2011  . KNEE, ARTHRITIS, DEGEN./OSTEO 01/30/2009  . SHOULDER PAIN 01/16/2009  . KNEE PAIN 01/16/2009  . IMPINGEMENT SYNDROME 01/16/2009  . TENDINITIS, LEFT KNEE 01/16/2009    Vangie Bicker, OTR/L [phone]  03/25/2014, 10:12 AM  [place], [place], [address] Phone: [phone]   Fax:  [phone]

## 2014-03-29 ENCOUNTER — Ambulatory Visit (HOSPITAL_COMMUNITY)
Admission: RE | Admit: 2014-03-29 | Discharge: 2014-03-29 | Disposition: A | Discharge status: Home / Self Care | Payer: BC Managed Care – PPO | Source: Ambulatory Visit | Attending: Orthopedic Surgery | Admitting: Orthopedic Surgery

## 2014-03-29 DIAGNOSIS — M25511 Pain in right shoulder: Secondary | ICD-10-CM

## 2014-03-29 DIAGNOSIS — Z5189 Encounter for other specified aftercare: Secondary | ICD-10-CM | POA: Diagnosis not present

## 2014-03-29 DIAGNOSIS — M25611 Stiffness of right shoulder, not elsewhere classified: Secondary | ICD-10-CM

## 2014-03-29 NOTE — Therapy (Signed)
[place], [place], [address] Phone: [phone]   Fax:  [phone]  Occupational Therapy Reassessment and Treatment  Patient Details  Name: Zachary Murillo. MRN: [id number] Date of Birth: [birth year] Referring Provider:  Carole Civil, MD  Encounter Date: 03/29/2014      OT End of Session - 03/29/14 1442    Visit Number 19   Number of Visits 19   Authorization Type BCBS   OT Start Time 6269   OT Stop Time 1430   OT Time Calculation (min) 36 min   Activity Tolerance Patient tolerated treatment well   Behavior During Therapy WFL for tasks assessed/performed      Past Medical History  Diagnosis Date  . Diabetes mellitus   . Sleep apnea   . Overweight(278.02)   . Hypertension   . Stroke     Past Surgical History  Procedure Laterality Date  . Knee arthroscopy    . Nasal septum surgery    . Cardiac catheterization    . Back surgery      There were no vitals taken for this visit.  Visit Diagnosis:  Pain in joint, shoulder region, right  Decreased range of motion of shoulder, right      Subjective Assessment - 03/29/14 1425    Symptoms S: I only have pain if I raise my arm out to the side.    Special Tests FOTO score: 71/100   Currently in Pain? No/denies          [facility] OT Assessment - 03/29/14 1402    Assessment   Diagnosis right rotator cuff syndrome   Precautions   Precautions None   AROM   Overall AROM Comments Assessed in sitting, with ER/IR adducted   Right Shoulder Flexion 155 Degrees  last progress note: 140   Right Shoulder ABduction 131 Degrees  last progress note: 113   Right Shoulder Internal Rotation 90 Degrees  same last progress note   Right Shoulder External Rotation 60 Degrees  last progress note: 51   PROM   Overall PROM Comments Assessed in supine, with ER/IR adducted (Previous Eval 11/24)   Right Shoulder Flexion 165 Degrees  last progress note: 147   Right Shoulder ABduction 180 Degrees  last progress note: 168   Right Shoulder Internal Rotation 90 Degrees  same on last progress note   Right Shoulder External Rotation 50 Degrees  last progress note: 34   Strength   Right Shoulder Flexion 5/5   Right Shoulder ABduction 5/5   Right Shoulder Internal Rotation 4+/5   Right Shoulder External Rotation 5/5             OT Education - 03/29/14 1444    Education provided Yes   Education Details red theraband   Person(s) Educated Patient   Methods Explanation;Demonstration;Handout   Comprehension Verbalized understanding          OT Short Term Goals - 03/29/14 1413    OT SHORT TERM GOAL #1   Title pt will be educated on HEP   OT [place] #2   Title Pt will imporve PROM in RUE to [facility] for imporved abiltiy to reach back of head   Status Partially Met   OT SHORT TERM GOAL #3   Title Pt will decrease pain with reaching tasks to less than 5/10   Status Achieved   OT SHORT TERM GOAL #4   Title Pt will increase RUE strength to  3+/5 for improved ability to reach into cabinets   OT SHORT TERM GOAL #5   Title Pt will decrase fascail restrictions to minimal in RUE for decrease pain with motion           OT Long Term Goals - 03/29/14 1413    OT LONG TERM GOAL #1   Title pt will acheive highest level of functioning in all ADL, IADL, work, and leisure activities   Status Achieved   OT [place] #2   Title Pt will achieve AROM in RUE WFL for improved ability to reach the back of his head   Status Achieved   OT LONG TERM GOAL #3   Title pt will have pain of less than 2/10 in RUE with reaching tasks    Status Achieved   OT LONG TERM GOAL #4   Title Pt will decreased fascial restirctions to less than minimal, for decreased pain with motion.   Status Achieved   OT LONG TERM GOAL #5   Title pt will have strength in RUE of atleast 4+/5 for improved ability to reach into cabinets.   Status Achieved                Plan - 03/29/14 1442    Clinical Impression Statement A: Reassessment completed this date. Patient met all therapy goals and is ready for discharge. patient only feels pain when he performed abduction to right shoulder. Pt has shown improvement with ROM and strength with all shoulder ranges. Pt given theraband HEP for home use.    Plan P: D/C from therapy.        Problem List Patient Active Problem List   Diagnosis Date Noted  . DDD (degenerative disc disease), 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/2013  . Difficulty in walking(719.7) 01/25/2013  . CVA (cerebral infarction) 01/22/2013  . Lack of coordination 01/22/2013  . OA (osteoarthritis) of knee 09/05/2011  . Knee pain, right 09/05/2011  . KNEE, ARTHRITIS, DEGEN./OSTEO 01/30/2009  . SHOULDER PAIN 01/16/2009  . KNEE PAIN 01/16/2009  . IMPINGEMENT SYNDROME 01/16/2009  . TENDINITIS, LEFT KNEE 01/16/2009  OCCUPATIONAL THERAPY DISCHARGE SUMMARY  Visits from Start of Care: 19  Current functional level related to goals / functional outcomes: See goals above   Remaining deficits: See clinical impression statement above   Education / Equipment: Theraband HEP Plan: Patient agrees to discharge.  Patient goals were met. Patient is being discharged due to meeting the stated rehab goals.  ?????       Ailene Ravel, OTR/L,CBIS  [phone]  03/29/2014, 2:52 PM  [facility] [address] [place], [place], [address] Phone: [phone]   Fax:  [phone]

## 2014-03-29 NOTE — Patient Instructions (Signed)
Strengthening: Chest Pull - Resisted   Hold Theraband in front of body with hands about shoulder width a part. Pull band a part and back together slowly. Repeat __15__ times. Complete __1__ set(s) per session.. Repeat __2__ session(s) per day.    PNF Strengthening: Resisted   Standing with resistive band around each hand, bring right arm up and away, thumb back. Repeat __15__ times per set. Do __1__ sets per session. Do __2__ sessions per day.    PNF Strengthening: Resisted   Standing with resistive band around each hand, bring right arm up and across body. Repeat _15___ times per set. Do _1___ sets per session. Do __2__ sessions per day.     Resisted External Rotation: in Neutral - Bilateral   Sit or stand, tubing in both hands, elbows at sides, bent to 90, forearms forward. Pinch shoulder blades together and rotate forearms out. Keep elbows at sides. Repeat __15__ times per set. Do _1___ sets per session. Do ___2_ sessions per day.   PNF Strengthening: Resisted   Standing, hold resistive band above head. Bring right arm down and out from side. Repeat __15__ times per set. Do ___1_ sets per session. Do __2__ sessions per day.

## 2014-04-01 ENCOUNTER — Ambulatory Visit (HOSPITAL_COMMUNITY): Payer: BC Managed Care – PPO

## 2014-04-05 ENCOUNTER — Encounter (HOSPITAL_COMMUNITY): Payer: BC Managed Care – PPO | Admitting: Specialist

## 2014-04-07 ENCOUNTER — Encounter (HOSPITAL_COMMUNITY): Payer: BC Managed Care – PPO | Admitting: Specialist

## 2014-05-30 ENCOUNTER — Encounter: Payer: BC Managed Care – PPO | Attending: "Endocrinology | Admitting: Nutrition

## 2014-05-30 ENCOUNTER — Encounter: Payer: Self-pay | Admitting: Nutrition

## 2014-05-30 DIAGNOSIS — Z6838 Body mass index (BMI) 38.0-38.9, adult: Secondary | ICD-10-CM | POA: Diagnosis not present

## 2014-05-30 DIAGNOSIS — Z713 Dietary counseling and surveillance: Secondary | ICD-10-CM | POA: Insufficient documentation

## 2014-05-30 DIAGNOSIS — Z794 Long term (current) use of insulin: Secondary | ICD-10-CM | POA: Diagnosis not present

## 2014-05-30 DIAGNOSIS — E118 Type 2 diabetes mellitus with unspecified complications: Secondary | ICD-10-CM | POA: Diagnosis not present

## 2014-05-30 DIAGNOSIS — E1165 Type 2 diabetes mellitus with hyperglycemia: Secondary | ICD-10-CM

## 2014-05-30 DIAGNOSIS — IMO0002 Reserved for concepts with insufficient information to code with codable children: Secondary | ICD-10-CM

## 2014-05-30 NOTE — Progress Notes (Signed)
  Medical Nutrition Therapy:  Appt start time: 9:30 end time:  945 am  Assessment:  Primary concerns today: DIabetes. A1C back up today to 9.2%, up from 7.4%. His wife is with him today. He admits to eating more potatoes than he use to eat. Still doing water exercises daily and working out at [employer]. Any weight loss may be due to elevated blood sugars.    He has had DM for over 20+ years and he may need to be on basal/bolus insulin now to help control his blood sugars.  Preferred Learning Style:   Visual  Hands on   Learning Readiness:   Not ready  Contemplating  Ready  Change in progress  MEDICATIONS: see list   DIETARY INTAKE:  24-hr recall:   B) 2 eggs, 1 ww toast or english muffins, and 1 strip of [place] bacon and 3/4 cup of wheaties with 4 oz of skim milk L) 4 oz meat, squash, corn, water  D Protein, 1-2 low carb vegetables, and 3 carb choices for dinner, water Had been eating more potatoes than he was usually.  Usual physical activity: He is doing water aerobics and some strength training  Estimated energy needs: 1800 calories 200 g carbohydrates 135 g protein 50 g fat  Progress Towards Goal(s):  In progress.   Nutritional Diagnosis:  NB-1.1 Food and nutrition-related knowledge deficit As related to diabets.  As evidenced by A1C of (%.    Intervention:  Nutrition couinseling and diabetes education. Reviewed Meal planning, CHO counting, portion sizes and pancreatic function may be exhausted and may need to go on basal/bolus insulin.   Plan:  1. Cut out grits and potatoes. 2. Swtich breakfast cereal to [place] instead of Wheaties for less carbs. 3. Eliminate toast and fruit at breakfast if eating cereal with milk. 4. May have milk if within your 30-45 g CHO at meals. 5. Avoid snacks. 6. Consider need for insulin to improve DM control and prevent complications of DM.  Goal: 1. Lose 2 lbs per MONTH 2. Follow Plate Method and count carbs 3. Get A1C to 7%  % in three months.  Teaching Method Utilized:  Visual Auditory Hands on  Handouts given during visit include: Carb Counting and Food Label handouts Meal Plan Card  My Plate  Barriers to learning/adherence to lifestyle change: none  Demonstrated degree of understanding via:  Teach Back   Monitoring/Evaluation:  Dietary intake, exercise, SBG, and body weight in 3 month(s).

## 2014-05-30 NOTE — Patient Instructions (Signed)
  Plan:  1. Cut out grits and potatoes. 2. Swtich breakfast cereal to [place] instead of Wheaties for less carbs. 3. Eliminate toast and fruit at breakfast if eating cereal with milk. 4. May have milk if within your 30-45 g CHO at meals. 5. Avoid snacks. 6. Consider need for insulin to improve DM control and prevent complications of DM.  Goal: 1. Lose 2 lbs per MONTH 2. Follow Plate Method and count carbs 3. Get A1C to 7% % in three months.

## 2014-08-03 DIAGNOSIS — I10 Essential (primary) hypertension: Secondary | ICD-10-CM | POA: Insufficient documentation

## 2015-01-30 ENCOUNTER — Ambulatory Visit (INDEPENDENT_AMBULATORY_CARE_PROVIDER_SITE_OTHER): Payer: Medicare Other | Admitting: "Endocrinology

## 2015-01-30 ENCOUNTER — Encounter: Payer: Self-pay | Admitting: "Endocrinology

## 2015-01-30 VITALS — BP 148/84 | HR 81 | Ht 70.0 in | Wt 262.0 lb

## 2015-01-30 DIAGNOSIS — I10 Essential (primary) hypertension: Secondary | ICD-10-CM | POA: Diagnosis not present

## 2015-01-30 DIAGNOSIS — E559 Vitamin D deficiency, unspecified: Secondary | ICD-10-CM

## 2015-01-30 DIAGNOSIS — E6609 Other obesity due to excess calories: Secondary | ICD-10-CM | POA: Diagnosis not present

## 2015-01-30 DIAGNOSIS — E785 Hyperlipidemia, unspecified: Secondary | ICD-10-CM

## 2015-01-30 DIAGNOSIS — E669 Obesity, unspecified: Secondary | ICD-10-CM | POA: Insufficient documentation

## 2015-01-30 DIAGNOSIS — E782 Mixed hyperlipidemia: Secondary | ICD-10-CM | POA: Insufficient documentation

## 2015-01-30 DIAGNOSIS — E1159 Type 2 diabetes mellitus with other circulatory complications: Secondary | ICD-10-CM | POA: Diagnosis not present

## 2015-01-30 DIAGNOSIS — Z6832 Body mass index (BMI) 32.0-32.9, adult: Secondary | ICD-10-CM

## 2015-01-30 HISTORY — DX: Other obesity due to excess calories: E66.09

## 2015-01-30 NOTE — Progress Notes (Signed)
Subjective:    Patient ID: Zachary Shadow., male    DOB: May 28, 1950, PCP Zachary Bogus, MD   Past Medical History  Diagnosis Date  . Diabetes mellitus ([place])   . Sleep apnea   . Overweight(278.02)   . Hypertension   . Stroke [facility])    Past Surgical History  Procedure Laterality Date  . Knee arthroscopy    . Nasal septum surgery    . Cardiac catheterization    . Back surgery     Social History   Social History  . Marital Status: Married    Spouse Name: N/A  . Number of Children: N/A  . Years of Education: Masters   Social History Main Topics  . Smoking status: Never Smoker   . Smokeless tobacco: None  . Alcohol Use: Yes     Comment: occ  . Drug Use: No  . Sexual Activity: Not Currently   Other Topics Concern  . None   Social History Narrative   Outpatient Encounter Prescriptions as of 01/30/2015  Medication Sig  . amLODipine (NORVASC) 10 MG tablet Take 2.5 mg by mouth every morning.  Marland Kitchen atorvastatin (LIPITOR) 20 MG tablet Take 20 mg by mouth at bedtime.  . carvedilol (COREG) 25 MG tablet Take 25 mg by mouth 2 (two) times daily with a meal.  . docusate sodium (COLACE) 100 MG capsule Take 100 mg by mouth 2 (two) times daily.  Marland Kitchen eplerenone (INSPRA) 50 MG tablet Take 100 mg by mouth 2 (two) times daily.  Marland Kitchen lisinopril (PRINIVIL,ZESTRIL) 40 MG tablet Take 20 mg by mouth every morning.  . Multiple Vitamins-Minerals (CENTRUM PO) Take 1 tablet by mouth every morning.  . pantoprazole (PROTONIX) 40 MG tablet Take 40 mg by mouth daily.  . sertraline (ZOLOFT) 50 MG tablet Take 1 tablet (50 mg total) by mouth daily.  . vitamin B-12 (CYANOCOBALAMIN) 1000 MCG tablet Take 1,000 mcg by mouth every morning.  . [DISCONTINUED] Insulin Detemir (LEVEMIR FLEXPEN) 100 UNIT/ML SOPN Inject 10 Units into the skin at bedtime.  Marland Kitchen aspirin EC 81 MG tablet Take 81 mg by mouth once.   . cetirizine (ZYRTEC) 10 MG tablet Take 10 mg by mouth daily as needed for allergies.   . cloNIDine  (CATAPRES) 0.2 MG tablet Take 0.2 mg by mouth 3 (three) times daily.  . clopidogrel (PLAVIX) 75 MG tablet Take 75 mg by mouth daily with breakfast.  . colesevelam (WELCHOL) 625 MG tablet Take 1,875 mg by mouth 2 (two) times daily.   . furosemide (LASIX) 40 MG tablet Take 40 mg by mouth daily as needed for edema.  . minoxidil (LONITEN) 10 MG tablet Take 10 mg by mouth every morning.  . potassium chloride SA (K-DUR,KLOR-CON) 20 MEQ tablet Take 20 mEq by mouth 3 (three) times daily.  . sodium chloride (OCEAN) 0.65 % nasal spray Place 1 spray into the nose as needed for congestion.  Marland Kitchen terazosin (HYTRIN) 10 MG capsule Take 10 mg by mouth at bedtime.  . triamterene-hydrochlorothiazide (MAXZIDE) 75-50 MG per tablet Take 1 tablet by mouth every morning.  . [DISCONTINUED] acetaminophen (TYLENOL) 500 MG tablet Take 1,500 mg by mouth daily as needed for mild pain, moderate pain or headache.  . [DISCONTINUED] Ascorbic Acid (VITAMIN C) 1000 MG tablet Take 1,000 mg by mouth every morning.  . [DISCONTINUED] Canagliflozin (INVOKANA) 300 MG TABS Take 300 mg by mouth every morning.  . [DISCONTINUED] Liraglutide (VICTOZA) 18 MG/3ML SOPN Inject 1.2 mcg into the skin at  bedtime.  . [DISCONTINUED] metFORMIN (GLUCOPHAGE) 1000 MG tablet Take 1,000 mg by mouth 2 (two) times daily with a meal.   No facility-administered encounter medications on file as of 01/30/2015.   ALLERGIES: Allergies  Allergen Reactions  . Prednisone     Starts itching   VACCINATION STATUS:  There is no immunization history on file for this patient.  Diabetes He presents for his follow-up diabetic visit. He has type 2 diabetes mellitus. Onset time: He was diagnosed at approximate age of 64 years. His disease course has been improving. There are no hypoglycemic associated symptoms. Pertinent negatives for hypoglycemia include no confusion, headaches, pallor or seizures. There are no diabetic associated symptoms. Pertinent negatives for  diabetes include no chest pain, no fatigue, no polydipsia, no polyphagia, no polyuria and no weakness. There are no hypoglycemic complications. Symptoms are improving. Diabetic complications include a CVA. Risk factors for coronary artery disease include diabetes mellitus, dyslipidemia, hypertension, male sex, obesity and sedentary lifestyle. Current diabetic treatment includes insulin injections and oral agent (dual therapy). He is compliant with treatment most of the time. His weight is decreasing steadily (After his Roux-en-Y surgery he lost 14 more pounds, making a total of 89 pounds weight loss.). He is following a generally unhealthy diet. He participates in exercise intermittently. His breakfast blood glucose range is generally 90-110 mg/dl. His lunch blood glucose range is generally 130-140 mg/dl. His overall blood glucose range is 90-110 mg/dl. An ACE inhibitor/angiotensin II receptor blocker is being taken. Eye exam is current.  Hyperlipidemia This is a chronic problem. The current episode started more than 1 year ago. Pertinent negatives include no chest pain, myalgias or shortness of breath. Current antihyperlipidemic treatment includes statins.  Hypertension This is a chronic problem. The current episode started more than 1 year ago. The problem is uncontrolled. Pertinent negatives include no chest pain, headaches, neck pain, palpitations or shortness of breath. Risk factors for coronary artery disease include diabetes mellitus, dyslipidemia, male gender and obesity. Hypertensive end-organ damage includes CVA.     Review of Systems  Constitutional: Negative for fatigue and unexpected weight change.       He kept losing weight, lost total of 89 pounds.  HENT: Negative for dental problem, mouth sores and trouble swallowing.   Eyes: Negative for visual disturbance.  Respiratory: Negative for cough, choking, chest tightness, shortness of breath and wheezing.   Cardiovascular: Negative for  chest pain, palpitations and leg swelling.  Gastrointestinal: Negative for nausea, vomiting, abdominal pain, diarrhea, constipation and abdominal distention.  Endocrine: Negative for polydipsia, polyphagia and polyuria.  Genitourinary: Negative for dysuria, urgency, hematuria and flank pain.  Musculoskeletal: Negative for myalgias, back pain, gait problem and neck pain.  Skin: Negative for pallor, rash and wound.  Neurological: Negative for seizures, syncope, weakness, numbness and headaches.  Psychiatric/Behavioral: Negative.  Negative for confusion and dysphoric mood.    Objective:    BP 148/84 mmHg  Pulse 81  Ht 5\' 10"  (1.778 m)  Wt 262 lb (118.842 kg)  BMI 37.59 kg/m2  SpO2 98%  Wt Readings from Last 3 Encounters:  01/30/15 262 lb (118.842 kg)  05/30/14 273 lb 12.8 oz (124.195 kg)  02/28/14 277 lb (125.646 kg)    Physical Exam  Constitutional: He is oriented to person, place, and time. He appears well-developed and well-nourished. He is cooperative. No distress.  HENT:  Head: Normocephalic and atraumatic.  Eyes: EOM are normal.  Neck: Normal range of motion. Neck supple. No tracheal deviation present. No  thyromegaly present.  Cardiovascular: Normal rate, S1 normal, S2 normal and normal heart sounds.  Exam reveals no gallop.   No murmur heard. Pulses:      Dorsalis pedis pulses are 1+ on the right side, and 1+ on the left side.       Posterior tibial pulses are 1+ on the right side, and 1+ on the left side.  Pulmonary/Chest: Breath sounds normal. No respiratory distress. He has no wheezes.  Abdominal: Soft. Bowel sounds are normal. He exhibits no distension. There is no tenderness. There is no guarding and no CVA tenderness.  Healing laparoscopic surgical wound.  Musculoskeletal: He exhibits no edema.       Right shoulder: He exhibits no swelling and no deformity.  Neurological: He is alert and oriented to person, place, and time. He has normal strength and normal reflexes.  No cranial nerve deficit or sensory deficit. Gait normal.  Skin: Skin is warm and dry. No rash noted. No cyanosis. Nails show no clubbing.  Psychiatric: He has a normal mood and affect. His speech is normal and behavior is normal. Judgment and thought content normal. Cognition and memory are normal.    Results for orders placed or performed during the hospital encounter of 03/07/13  CBC with Differential  Result Value Ref Range   WBC 7.0 4.0 - 10.5 K/uL   RBC 4.43 4.22 - 5.81 MIL/uL   Hemoglobin 13.1 13.0 - 17.0 g/dL   HCT 37.8 (L) 39.0 - 52.0 %   MCV 85.3 78.0 - 100.0 fL   MCH 29.6 26.0 - 34.0 pg   MCHC 34.7 30.0 - 36.0 g/dL   RDW 15.5 11.5 - 15.5 %   Platelets 179 150 - 400 K/uL   Neutrophils Relative % 62 43 - 77 %   Neutro Abs 4.3 1.7 - 7.7 K/uL   Lymphocytes Relative 19 12 - 46 %   Lymphs Abs 1.3 0.7 - 4.0 K/uL   Monocytes Relative 14 (H) 3 - 12 %   Monocytes Absolute 1.0 0.1 - 1.0 K/uL   Eosinophils Relative 4 0 - 5 %   Eosinophils Absolute 0.3 0.0 - 0.7 K/uL   Basophils Relative 0 0 - 1 %   Basophils Absolute 0.0 0.0 - 0.1 K/uL  Basic metabolic panel  Result Value Ref Range   Sodium 140 135 - 145 mEq/L   Potassium 2.9 (L) 3.5 - 5.1 mEq/L   Chloride 101 96 - 112 mEq/L   CO2 30 19 - 32 mEq/L   Glucose, Bld 100 (H) 70 - 99 mg/dL   BUN 22 6 - 23 mg/dL   Creatinine, Ser 1.03 0.50 - 1.35 mg/dL   Calcium 8.7 8.4 - 10.5 mg/dL   GFR calc non Af Amer 75 (L) >90 mL/min   GFR calc Af Amer 87 (L) >90 mL/min   Complete Blood Count (Most recent): Lab Results  Component Value Date   WBC 7.0 03/07/2013   HGB 13.1 03/07/2013   HCT 37.8* 03/07/2013   MCV 85.3 03/07/2013   PLT 179 03/07/2013   Chemistry (most recent): Lab Results  Component Value Date   NA 140 03/07/2013   K 2.9* 03/07/2013   CL 101 03/07/2013   CO2 30 03/07/2013   BUN 22 03/07/2013   CREATININE 1.03 03/07/2013   Diabetic Labs (most recent): Lab Results  Component Value Date   HGBA1C 7.3* 01/18/2013    Lipid profile (most recent): No results found for: TRIG, CHOL  Assessment & Plan:   1. Type 2 diabetes mellitus with vascular disease (HCC)  His diabetes is  complicated by CVA and patient remains at a high risk for more acute and chronic complications of diabetes which include CAD, CVA, CKD, retinopathy, and neuropathy. These are all discussed in detail with the patient.  Patient came with controlled glucose profile, and  recent A1c of 7.1 %.  Glucose logs and insulin administration records pertaining to this visit,  to be scanned into patient's records.  Recent labs reviewed.   - I have re-counseled the patient on diet management and weight loss  by adopting a carbohydrate restricted / protein rich  Diet.  - Suggestion is made for patient to avoid simple carbohydrates   from their diet including Cakes , Desserts, Ice Cream,  Soda (  diet and regular) , Sweet Tea , Candies,  Chips, Cookies, Artificial Sweeteners,   and "Sugar-free" Products .  This will help patient to have stable blood glucose profile and potentially avoid unintended  Weight gain.  - Patient is advised to stick to a routine mealtimes to eat 3 meals  a day and avoid unnecessary snacks ( to snack only to correct hypoglycemia).  - The patient  has been  scheduled with Jearld Fenton, RDN, CDE for individualized DM education, and he is status post Roux-en-Y bariatric surgery.  - I have approached patient with the following individualized plan to manage diabetes and patient agrees.  -Based on his blood glucose readings on recent weeks after his way and why surgery, he would not need any therapy for diabetes. I advised him to discontinue Levemir, Victoza, metformin, Invokana.  - Patient will be considered for low-dose metformin if he loses control during his next visit. - Patient specific target  for A1c; LDL, HDL, Triglycerides, and  Waist Circumference were discussed in detail.  2) BP/HTN: Uncontrolled. Continue  current medications including ACEI/ARB. 3) Lipids/HPL:  continue statins. 4)  Weight/Diet: He is status post Roux-en-Y bariatric surgery, CDE consult in progress, exercise, and carbohydrates information provided.  5) Chronic Care/Health Maintenance:  -Patient is on ACEI/ARB and Statin medications and encouraged to continue to follow up with Ophthalmology, Podiatrist at least yearly or according to recommendations, and advised to  stay away from smoking. I have recommended yearly flu vaccine and pneumonia vaccination at least every 5 years; moderate intensity exercise for up to 150 minutes weekly; and  sleep for at least 7 hours a day.  - 25 minutes of time was spent on the care of this patient , 50% of which was applied for counseling on diabetes complications and their preventions.  6) Vitamin D deficiency I advised him to continue vitamin D.  - I advised patient to maintain close follow up with their PCP and he is bariatric team for ongoing care needs.  - Patient is asked to bring meter and  blood glucose logs during their next visit.   Follow up plan: -Return in about 3 months (around 05/02/2015) for diabetes, high blood pressure, high cholesterol.  Glade Lloyd, MD Phone: [phone]  Fax: [phone]   01/30/2015, 2:02 PM

## 2015-03-22 DIAGNOSIS — E2609 Other primary hyperaldosteronism: Secondary | ICD-10-CM | POA: Insufficient documentation

## 2015-04-04 DIAGNOSIS — Z9884 Bariatric surgery status: Secondary | ICD-10-CM

## 2015-04-04 HISTORY — DX: Bariatric surgery status: Z98.84

## 2015-05-01 ENCOUNTER — Other Ambulatory Visit: Payer: Self-pay | Admitting: "Endocrinology

## 2015-05-01 LAB — BASIC METABOLIC PANEL
BUN: 16 mg/dL (ref 7–25)
CALCIUM: 9 mg/dL (ref 8.6–10.3)
CO2: 30 mmol/L (ref 20–31)
CREATININE: 0.71 mg/dL (ref 0.70–1.25)
Chloride: 103 mmol/L (ref 98–110)
Glucose, Bld: 183 mg/dL — ABNORMAL HIGH (ref 65–99)
Potassium: 3.5 mmol/L (ref 3.5–5.3)
Sodium: 143 mmol/L (ref 135–146)

## 2015-05-01 LAB — HEMOGLOBIN A1C
Hgb A1c MFr Bld: 7.5 % — ABNORMAL HIGH (ref <5.7)
Mean Plasma Glucose: 169 mg/dL — ABNORMAL HIGH (ref <117)

## 2015-05-03 ENCOUNTER — Ambulatory Visit (INDEPENDENT_AMBULATORY_CARE_PROVIDER_SITE_OTHER): Payer: Medicare Other

## 2015-05-03 ENCOUNTER — Ambulatory Visit (INDEPENDENT_AMBULATORY_CARE_PROVIDER_SITE_OTHER): Payer: Medicare Other | Admitting: Orthopedic Surgery

## 2015-05-03 ENCOUNTER — Encounter: Payer: Self-pay | Admitting: Orthopedic Surgery

## 2015-05-03 VITALS — BP 177/100 | Ht 70.0 in | Wt 237.0 lb

## 2015-05-03 DIAGNOSIS — M1711 Unilateral primary osteoarthritis, right knee: Secondary | ICD-10-CM

## 2015-05-03 DIAGNOSIS — M25561 Pain in right knee: Secondary | ICD-10-CM

## 2015-05-03 NOTE — Progress Notes (Signed)
Patient ID: Zachary Murillo., male   DOB: 14-Sep-1950, 65 y.o.   MRN: [id number]  Chief Complaint  Patient presents with  . Knee Pain    right knee pain    HPI: 65 year old male soon to be going to [place] presents with chronic pain in his right knee. He's already had arthroscopic surgery cortisone injections anti-inflammatories weight loss and including weight loss surgery as well as activity modification comes in complaining of stiffness in his knee aching pain after activity with no improvement with any of the above listed measures  His weight loss is the only positive review of system findings  ROS see notes above   Past Medical History  Diagnosis Date  . Diabetes mellitus ([place])   . Sleep apnea   . Overweight(278.02)   . Hypertension   . Stroke ([place])   . History of Roux-en-Y gastric bypass     PHYSICAL EXAM  BP 177/100 mmHg  Ht 5\' 10"  (1.778 m)  Wt 237 lb (107.502 kg)  BMI 34.01 kg/m2 GENERAL appearance reveals no gross abnormalities  MENTAL STATUS we note that the patient is awake alert and oriented to person place and time MOOD/AFFECT ARE NORMAL   GAIT reveals normal gait pattern  without a limp  EXAM OF THE right KNEE SKIN no erythema lacerations or ecchymosis  INSPECTION medial joint line tenderness mild varus ROM 120 STABILITY cruciate ligaments are stable. Collateral ligament stable. MOTOR GRADE 5/5    examination of the  left knee   flexion is 130 no joint line tenderness no swelling no effusion  VASC 2+ dorsalis pedis pulse normal capillary refill excellent warmth to the extremity  NEURO sensation and no pathologic reflexes  LYMPH deferred noncontributory   IMAGING STUDIES  I have independently reviewed the x-rays and I interpreted the x-rays as follows:  Medial compartment gonarthrosis second or bone changes sclerosis mild osteophytes mild varus  Dx   primary osteoarthritis of the  right knee  PLAN  Supartz prescription for right knee

## 2015-05-03 NOTE — Patient Instructions (Signed)
Supartz will be ordered for you

## 2015-05-04 ENCOUNTER — Other Ambulatory Visit: Payer: Self-pay | Admitting: *Deleted

## 2015-05-04 DIAGNOSIS — M1711 Unilateral primary osteoarthritis, right knee: Secondary | ICD-10-CM

## 2015-05-04 MED ORDER — SODIUM HYALURONATE (VISCOSUP) 25 MG/2.5ML IX SOSY: PREFILLED_SYRINGE | INTRA_ARTICULAR | Status: DC

## 2015-05-08 ENCOUNTER — Ambulatory Visit: Payer: Self-pay | Admitting: "Endocrinology

## 2015-05-10 ENCOUNTER — Ambulatory Visit: Payer: Self-pay | Admitting: "Endocrinology

## 2015-05-16 ENCOUNTER — Telehealth: Payer: Self-pay | Admitting: Orthopedic Surgery

## 2015-05-17 ENCOUNTER — Ambulatory Visit: Payer: Self-pay | Admitting: "Endocrinology

## 2015-05-17 NOTE — Telephone Encounter (Signed)
Call received from [facility] ([facility], ph# [phone], in response to nurse call.  Per pharmacist Anastasia Fiedler, verified that medication is to be shipped directly to patient, and shipment will be done approximately 05/24/15.  Called patient to notify.

## 2015-05-17 NOTE — Telephone Encounter (Signed)
Noted  

## 2015-05-17 NOTE — Telephone Encounter (Signed)
Called [facility] to check on status of Supartz . Gave them insurance information they needed. Called patient and advised him to call [facility] pharmacy to set up delivery for the medication. Also, advised him to contact our office once he receives the medication for an appointment.

## 2015-05-22 ENCOUNTER — Ambulatory Visit (INDEPENDENT_AMBULATORY_CARE_PROVIDER_SITE_OTHER): Payer: Medicare Other | Admitting: "Endocrinology

## 2015-05-22 ENCOUNTER — Encounter: Payer: Self-pay | Admitting: "Endocrinology

## 2015-05-22 ENCOUNTER — Telehealth: Payer: Self-pay

## 2015-05-22 VITALS — BP 133/84 | HR 88 | Ht 70.0 in | Wt 235.0 lb

## 2015-05-22 DIAGNOSIS — I10 Essential (primary) hypertension: Secondary | ICD-10-CM | POA: Diagnosis not present

## 2015-05-22 DIAGNOSIS — E785 Hyperlipidemia, unspecified: Secondary | ICD-10-CM

## 2015-05-22 DIAGNOSIS — E1159 Type 2 diabetes mellitus with other circulatory complications: Secondary | ICD-10-CM | POA: Diagnosis not present

## 2015-05-22 NOTE — Progress Notes (Signed)
Subjective:    Patient ID: Zachary Murillo., male    DOB: 03-04-1951, PCP Alonza Bogus, MD   Past Medical History  Diagnosis Date  . Diabetes mellitus ([place])   . Sleep apnea   . Overweight(278.02)   . Hypertension   . Stroke ([place])   . History of Roux-en-Y gastric bypass    Past Surgical History  Procedure Laterality Date  . Knee arthroscopy    . Nasal septum surgery    . Cardiac catheterization    . Back surgery     Social History   Social History  . Marital Status: Married    Spouse Name: N/A  . Number of Children: N/A  . Years of Education: Masters   Social History Main Topics  . Smoking status: Never Smoker   . Smokeless tobacco: None  . Alcohol Use: Yes     Comment: occ  . Drug Use: No  . Sexual Activity: Not Currently   Other Topics Concern  . None   Social History Narrative   Outpatient Encounter Prescriptions as of 05/22/2015  Medication Sig  . aspirin EC 81 MG tablet Take 81 mg by mouth once.   Marland Kitchen atorvastatin (LIPITOR) 20 MG tablet Take 20 mg by mouth at bedtime.  . Calcium Citrate-Vitamin D (CALCIUM + D PO) Take by mouth 2 (two) times daily.  . carvedilol (COREG) 25 MG tablet Take 25 mg by mouth 2 (two) times daily with a meal.  . cetirizine (ZYRTEC) 10 MG tablet Take 10 mg by mouth daily as needed for allergies.   . Cyanocobalamin 500 MCG/0.1ML SOLN Place into the nose.  . docusate sodium (COLACE) 100 MG capsule Take 100 mg by mouth 2 (two) times daily.  Marland Kitchen eplerenone (INSPRA) 50 MG tablet Take 100 mg by mouth 2 (two) times daily.  Marland Kitchen lisinopril (PRINIVIL,ZESTRIL) 40 MG tablet Take 20 mg by mouth every morning.  . Multiple Vitamins-Minerals (CENTRUM PO) Take 1 tablet by mouth every morning.  . pantoprazole (PROTONIX) 40 MG tablet Take 40 mg by mouth daily.  . sertraline (ZOLOFT) 50 MG tablet Take 1 tablet (50 mg total) by mouth daily.  . vitamin B-12 (CYANOCOBALAMIN) 1000 MCG tablet Take 1,000 mcg by mouth every morning.  . Sodium Hyaluronate  (SUPARTZ) 25 MG/2.5ML SOSY One injection in right knee weekly for three weeks  . [DISCONTINUED] amLODipine (NORVASC) 10 MG tablet Take 2.5 mg by mouth every morning.  . [DISCONTINUED] cloNIDine (CATAPRES) 0.2 MG tablet Take 0.2 mg by mouth 3 (three) times daily.  . [DISCONTINUED] clopidogrel (PLAVIX) 75 MG tablet Take 75 mg by mouth daily with breakfast.  . [DISCONTINUED] colesevelam (WELCHOL) 625 MG tablet Take 1,875 mg by mouth 2 (two) times daily.   . [DISCONTINUED] furosemide (LASIX) 40 MG tablet Take 40 mg by mouth daily as needed for edema.  . [DISCONTINUED] minoxidil (LONITEN) 10 MG tablet Take 10 mg by mouth every morning.  . [DISCONTINUED] potassium chloride SA (K-DUR,KLOR-CON) 20 MEQ tablet Take 20 mEq by mouth 3 (three) times daily.  . [DISCONTINUED] sodium chloride (OCEAN) 0.65 % nasal spray Place 1 spray into the nose as needed for congestion.  . [DISCONTINUED] terazosin (HYTRIN) 10 MG capsule Take 10 mg by mouth at bedtime.  . [DISCONTINUED] triamterene-hydrochlorothiazide (MAXZIDE) 75-50 MG per tablet Take 1 tablet by mouth every morning.   No facility-administered encounter medications on file as of 05/22/2015.   ALLERGIES: Allergies  Allergen Reactions  . Prednisone     Starts itching  VACCINATION STATUS:  There is no immunization history on file for this patient.  Diabetes He presents for his follow-up diabetic visit. He has type 2 diabetes mellitus. Onset time: He was diagnosed at approximate age of 61 years. His disease course has been improving. There are no hypoglycemic associated symptoms. Pertinent negatives for hypoglycemia include no confusion, headaches, pallor or seizures. Pertinent negatives for diabetes include no chest pain, no fatigue, no polydipsia, no polyphagia, no polyuria and no weakness. There are no hypoglycemic complications. Symptoms are improving. Diabetic complications include a CVA. Risk factors for coronary artery disease include diabetes  mellitus, dyslipidemia, hypertension, male sex, obesity and sedentary lifestyle. Current diabetic treatment includes insulin injections and oral agent (dual therapy). He is compliant with treatment most of the time. His weight is decreasing steadily (After his Roux-en-Y surgery he lost 27 more pounds, making a total of 116 pounds weight loss.). He is following a generally unhealthy diet. He participates in exercise intermittently. An ACE inhibitor/angiotensin II receptor blocker is being taken. Eye exam is current.  Hyperlipidemia This is a chronic problem. The current episode started more than 1 year ago. Pertinent negatives include no chest pain, myalgias or shortness of breath. Current antihyperlipidemic treatment includes statins.  Hypertension This is a chronic problem. The current episode started more than 1 year ago. The problem is uncontrolled. Pertinent negatives include no chest pain, headaches, neck pain, palpitations or shortness of breath. Risk factors for coronary artery disease include diabetes mellitus, dyslipidemia, male gender and obesity. Hypertensive end-organ damage includes CVA.     Review of Systems  Constitutional: Negative for fatigue and unexpected weight change.       He kept losing weight, lost total of 116pounds.  HENT: Negative for dental problem, mouth sores and trouble swallowing.   Eyes: Negative for visual disturbance.  Respiratory: Negative for cough, choking, chest tightness, shortness of breath and wheezing.   Cardiovascular: Negative for chest pain, palpitations and leg swelling.  Gastrointestinal: Negative for nausea, vomiting, abdominal pain, diarrhea, constipation and abdominal distention.  Endocrine: Negative for polydipsia, polyphagia and polyuria.  Genitourinary: Negative for dysuria, urgency, hematuria and flank pain.  Musculoskeletal: Negative for myalgias, back pain, gait problem and neck pain.  Skin: Negative for pallor, rash and wound.   Neurological: Negative for seizures, syncope, weakness, numbness and headaches.  Psychiatric/Behavioral: Negative.  Negative for confusion and dysphoric mood.    Objective:    BP 133/84 mmHg  Pulse 88  Ht 5\' 10"  (1.778 m)  Wt 235 lb (106.595 kg)  BMI 33.72 kg/m2  SpO2 96%  Wt Readings from Last 3 Encounters:  05/22/15 235 lb (106.595 kg)  05/03/15 237 lb (107.502 kg)  01/30/15 262 lb (118.842 kg)    Physical Exam  Constitutional: He is oriented to person, place, and time. He appears well-developed and well-nourished. He is cooperative. No distress.  HENT:  Head: Normocephalic and atraumatic.  Eyes: EOM are normal.  Neck: Normal range of motion. Neck supple. No tracheal deviation present. No thyromegaly present.  Cardiovascular: Normal rate, S1 normal, S2 normal and normal heart sounds.  Exam reveals no gallop.   No murmur heard. Pulses:      Dorsalis pedis pulses are 1+ on the right side, and 1+ on the left side.       Posterior tibial pulses are 1+ on the right side, and 1+ on the left side.  Pulmonary/Chest: Breath sounds normal. No respiratory distress. He has no wheezes.  Abdominal: Soft. Bowel sounds are normal.  He exhibits no distension. There is no tenderness. There is no guarding and no CVA tenderness.  Healing laparoscopic surgical wound.  Musculoskeletal: He exhibits no edema.       Right shoulder: He exhibits no swelling and no deformity.  Neurological: He is alert and oriented to person, place, and time. He has normal strength and normal reflexes. No cranial nerve deficit or sensory deficit. Gait normal.  Skin: Skin is warm and dry. No rash noted. No cyanosis. Nails show no clubbing.  Psychiatric: He has a normal mood and affect. His speech is normal and behavior is normal. Judgment and thought content normal. Cognition and memory are normal.    Results for orders placed or performed in visit on [id number]  Basic metabolic panel  Result Value Ref Range   Sodium  143 135 - 146 mmol/L   Potassium 3.5 3.5 - 5.3 mmol/L   Chloride 103 98 - 110 mmol/L   CO2 30 20 - 31 mmol/L   Glucose, Bld 183 (H) 65 - 99 mg/dL   BUN 16 7 - 25 mg/dL   Creat 0.71 0.70 - 1.25 mg/dL   Calcium 9.0 8.6 - 10.3 mg/dL  Hemoglobin A1c  Result Value Ref Range   Hgb A1c MFr Bld 7.5 (H) <5.7 %   Mean Plasma Glucose 169 (H) <117 mg/dL   Diabetic Labs (most recent): Lab Results  Component Value Date   HGBA1C 7.5* 05/01/2015   HGBA1C 7.3* 01/18/2013     Assessment & Plan:   1. Type 2 diabetes mellitus with vascular disease (HCC)  His diabetes is  complicated by CVA and patient remains at a high risk for more acute and chronic complications of diabetes which include CAD, CVA, CKD, retinopathy, and neuropathy. These are all discussed in detail with the patient.  Patient came with controlled glucose profile, and  recent A1c of 7.5 %.  Glucose logs and insulin administration records pertaining to this visit,  to be scanned into patient's records.  Recent labs reviewed.   - I have re-counseled the patient on diet management and weight loss  by adopting a carbohydrate restricted / protein rich  Diet.  - Suggestion is made for patient to avoid simple carbohydrates   from their diet including Cakes , Desserts, Ice Cream,  Soda (  diet and regular) , Sweet Tea , Candies,  Chips, Cookies, Artificial Sweeteners,   and "Sugar-free" Products .  This will help patient to have stable blood glucose profile and potentially avoid unintended  Weight gain.  - Patient is advised to stick to a routine mealtimes to eat 3 meals  a day and avoid unnecessary snacks ( to snack only to correct hypoglycemia).  - The patient  has been  scheduled with Jearld Fenton, RDN, CDE for individualized DM education, and he is status post Roux-en-Y bariatric surgery.  - I have approached patient with the following individualized plan to manage diabetes and patient agrees.  -Based on his blood glucose readings  on recent weeks after his way and why surgery, he would not need any therapy for diabetes. I advised him to stay away from Levemir, Victoza, metformin, Invokana. - If a1c remains above 7% , we will resume low dose metformin next visit. - Patient will be considered for low-dose metformin if he loses control during his next visit. - Patient specific target  for A1c; LDL, HDL, Triglycerides, and  Waist Circumference were discussed in detail.  2) BP/HTN: Uncontrolled. Continue current medications including ACEI/ARB. 3)  Lipids/HPL:  continue statins. 4)  Weight/Diet: He is status post Roux-en-Y bariatric surgery, CDE consult in progress, exercise, and carbohydrates information provided.  5) Chronic Care/Health Maintenance:  -Patient is on ACEI/ARB and Statin medications and encouraged to continue to follow up with Ophthalmology, Podiatrist at least yearly or according to recommendations, and advised to  stay away from smoking. I have recommended yearly flu vaccine and pneumonia vaccination at least every 5 years; moderate intensity exercise for up to 150 minutes weekly; and  sleep for at least 7 hours a day.  - 25 minutes of time was spent on the care of this patient , 50% of which was applied for counseling on diabetes complications and their preventions.  6) Vitamin D deficiency I advised him to continue vitamin D.  - I advised patient to maintain close follow up with their PCP and he is bariatric team for ongoing care needs.  - Patient is asked to bring meter and  blood glucose logs during their next visit.   Follow up plan: -Return in about 3 months (around 08/22/2015) for diabetes, high blood pressure, high cholesterol, follow up with pre-visit labs.  Glade Lloyd, MD Phone: [phone]  Fax: [phone]   05/22/2015, 9:52 AM

## 2015-05-22 NOTE — Patient Instructions (Signed)

## 2015-05-22 NOTE — Telephone Encounter (Signed)
error 

## 2015-06-05 ENCOUNTER — Ambulatory Visit (INDEPENDENT_AMBULATORY_CARE_PROVIDER_SITE_OTHER): Payer: Medicare Other | Admitting: Orthopedic Surgery

## 2015-06-05 ENCOUNTER — Encounter: Payer: Self-pay | Admitting: Orthopedic Surgery

## 2015-06-05 VITALS — BP 182/97 | Ht 70.0 in | Wt 235.0 lb

## 2015-06-05 DIAGNOSIS — M1711 Unilateral primary osteoarthritis, right knee: Secondary | ICD-10-CM

## 2015-06-05 NOTE — Progress Notes (Signed)
  Injection Orthovisc # 1/3  KNEE right   The patient has osteoarthritis of the knee. She has been on anti-inflammatories and had cortisone injection and has failed that conservative treatment.  The patient has been approved and wishes to try hyaluronic acid injection in the form orthovisc  The right  knee is prepped with alcohol and ethyl chloride  The injection is performed with a [id number] needle  No complications were noted  Appropriate instructions post injection were given

## 2015-06-12 ENCOUNTER — Ambulatory Visit (INDEPENDENT_AMBULATORY_CARE_PROVIDER_SITE_OTHER): Payer: Medicare Other | Admitting: Orthopedic Surgery

## 2015-06-12 VITALS — BP 164/87 | HR 73 | Ht 70.0 in | Wt 235.0 lb

## 2015-06-12 DIAGNOSIS — M1711 Unilateral primary osteoarthritis, right knee: Secondary | ICD-10-CM | POA: Diagnosis not present

## 2015-06-12 NOTE — Progress Notes (Signed)
  Injection Supartz # 2/3  KNEE right   The patient has osteoarthritis of the knee. She has been on anti-inflammatories and had cortisone injection and has failed that conservative treatment.  The patient has been approved and wishes to try hyaluronic acid injection in the form supartz The right  knee is prepped with alcohol and ethyl chloride  The injection is performed with a [id number] needle  No complications were noted  Appropriate instructions post injection were given Return 2 weeks

## 2015-06-26 ENCOUNTER — Ambulatory Visit: Payer: Medicare Other | Admitting: Orthopedic Surgery

## 2015-06-27 ENCOUNTER — Ambulatory Visit: Payer: Medicare Other | Admitting: Orthopedic Surgery

## 2015-06-27 DIAGNOSIS — M1711 Unilateral primary osteoarthritis, right knee: Secondary | ICD-10-CM

## 2015-06-27 NOTE — Progress Notes (Signed)
Procedure note for injection of supartz Diagnosis osteoarthritis of the knee  Verbal consent was obtained to inject the knee with supartz  Timeout was completed to confirm the injection site as the right   Knee  Ethyl chloride spray was used for anesthesia Alcohol was used to prep the skin. The infrapatellar lateral portal was used as an injection site and 1 vial of supartz was injected into the knee   No complications were noted

## 2015-06-29 LAB — HEMOGLOBIN A1C: HEMOGLOBIN A1C: 8.4

## 2015-07-03 ENCOUNTER — Encounter: Payer: Self-pay | Admitting: "Endocrinology

## 2015-07-03 ENCOUNTER — Ambulatory Visit (INDEPENDENT_AMBULATORY_CARE_PROVIDER_SITE_OTHER): Payer: Medicare Other | Admitting: "Endocrinology

## 2015-07-03 VITALS — BP 147/93 | HR 64 | Ht 70.0 in | Wt 233.0 lb

## 2015-07-03 DIAGNOSIS — E785 Hyperlipidemia, unspecified: Secondary | ICD-10-CM

## 2015-07-03 DIAGNOSIS — E1159 Type 2 diabetes mellitus with other circulatory complications: Secondary | ICD-10-CM | POA: Diagnosis not present

## 2015-07-03 DIAGNOSIS — I10 Essential (primary) hypertension: Secondary | ICD-10-CM | POA: Diagnosis not present

## 2015-07-03 MED ORDER — METFORMIN HCL 500 MG PO TABS: 500.0000 mg | ORAL_TABLET | Freq: Two times a day (BID) | ORAL | Status: DC

## 2015-07-03 NOTE — Progress Notes (Signed)
Subjective:    Patient ID: Zachary Murillo., male    DOB: [birth year], PCP Alonza Bogus, MD   Past Medical History  Diagnosis Date  . Diabetes mellitus ([place])   . Sleep apnea   . Overweight(278.02)   . Hypertension   . Stroke ([place])   . History of Roux-en-Y gastric bypass    Past Surgical History  Procedure Laterality Date  . Knee arthroscopy    . Nasal septum surgery    . Cardiac catheterization    . Back surgery     Social History   Social History  . Marital Status: Married    Spouse Name: N/A  . Number of Children: N/A  . Years of Education: Masters   Social History Main Topics  . Smoking status: Never Smoker   . Smokeless tobacco: None  . Alcohol Use: Yes     Comment: occ  . Drug Use: No  . Sexual Activity: Not Currently   Other Topics Concern  . None   Social History Narrative   Outpatient Encounter Prescriptions as of 07/03/2015  Medication Sig  . aspirin EC 81 MG tablet Take 81 mg by mouth once.   Marland Kitchen atorvastatin (LIPITOR) 20 MG tablet Take 20 mg by mouth at bedtime.  . Calcium Citrate-Vitamin D (CALCIUM + D PO) Take by mouth 2 (two) times daily.  . carvedilol (COREG) 25 MG tablet Take 25 mg by mouth 2 (two) times daily with a meal.  . cetirizine (ZYRTEC) 10 MG tablet Take 10 mg by mouth daily as needed for allergies.   . Cyanocobalamin 500 MCG/0.1ML SOLN Place into the nose.  . docusate sodium (COLACE) 100 MG capsule Take 100 mg by mouth 2 (two) times daily.  Marland Kitchen eplerenone (INSPRA) 50 MG tablet Take 100 mg by mouth 2 (two) times daily.  Marland Kitchen lisinopril (PRINIVIL,ZESTRIL) 40 MG tablet Take 20 mg by mouth every morning.  . metFORMIN (GLUCOPHAGE) 500 MG tablet Take 1 tablet (500 mg total) by mouth 2 (two) times daily with a meal.  . Multiple Vitamins-Minerals (CENTRUM PO) Take 1 tablet by mouth every morning.  . pantoprazole (PROTONIX) 40 MG tablet Take 40 mg by mouth daily.  . sertraline (ZOLOFT) 50 MG tablet Take 1 tablet (50 mg total) by mouth daily.   . Sodium Hyaluronate (SUPARTZ) 25 MG/2.5ML SOSY One injection in right knee weekly for three weeks  . vitamin B-12 (CYANOCOBALAMIN) 1000 MCG tablet Take 1,000 mcg by mouth every morning.   No facility-administered encounter medications on file as of 07/03/2015.   ALLERGIES: Allergies  Allergen Reactions  . Prednisone     Starts itching   VACCINATION STATUS:  There is no immunization history on file for this patient.  Diabetes He presents for his follow-up diabetic visit. He has type 2 diabetes mellitus. Onset time: He was diagnosed at approximate age of 27 years. His disease course has been improving. There are no hypoglycemic associated symptoms. Pertinent negatives for hypoglycemia include no confusion, headaches, pallor or seizures. Pertinent negatives for diabetes include no chest pain, no fatigue, no polydipsia, no polyphagia, no polyuria and no weakness. There are no hypoglycemic complications. Symptoms are improving. Diabetic complications include a CVA. Risk factors for coronary artery disease include diabetes mellitus, dyslipidemia, hypertension, male sex, obesity and sedentary lifestyle. Current diabetic treatment includes insulin injections and oral agent (dual therapy). He is compliant with treatment most of the time. His weight is decreasing steadily (After his Roux-en-Y surgery he lost 27 more pounds,  making a total of 116 pounds weight loss.). He is following a generally unhealthy diet. He participates in exercise intermittently. An ACE inhibitor/angiotensin II receptor blocker is being taken. Eye exam is current.  Hyperlipidemia This is a chronic problem. The current episode started more than 1 year ago. Pertinent negatives include no chest pain, myalgias or shortness of breath. Current antihyperlipidemic treatment includes statins.  Hypertension This is a chronic problem. The current episode started more than 1 year ago. The problem is uncontrolled. Pertinent negatives include  no chest pain, headaches, neck pain, palpitations or shortness of breath. Risk factors for coronary artery disease include diabetes mellitus, dyslipidemia, male gender and obesity. Hypertensive end-organ damage includes CVA.     Review of Systems  Constitutional: Negative for fatigue and unexpected weight change.       He kept losing weight, lost total of 116pounds.  HENT: Negative for dental problem, mouth sores and trouble swallowing.   Eyes: Negative for visual disturbance.  Respiratory: Negative for cough, choking, chest tightness, shortness of breath and wheezing.   Cardiovascular: Negative for chest pain, palpitations and leg swelling.  Gastrointestinal: Negative for nausea, vomiting, abdominal pain, diarrhea, constipation and abdominal distention.  Endocrine: Negative for polydipsia, polyphagia and polyuria.  Genitourinary: Negative for dysuria, urgency, hematuria and flank pain.  Musculoskeletal: Negative for myalgias, back pain, gait problem and neck pain.  Skin: Negative for pallor, rash and wound.  Neurological: Negative for seizures, syncope, weakness, numbness and headaches.  Psychiatric/Behavioral: Negative.  Negative for confusion and dysphoric mood.    Objective:    BP 147/93 mmHg  Pulse 64  Ht 5\' 10"  (1.778 m)  Wt 233 lb (105.688 kg)  BMI 33.43 kg/m2  SpO2 98%  Wt Readings from Last 3 Encounters:  07/03/15 233 lb (105.688 kg)  06/12/15 235 lb (106.595 kg)  06/05/15 235 lb (106.595 kg)    Physical Exam  Constitutional: He is oriented to person, place, and time. He appears well-developed and well-nourished. He is cooperative. No distress.  HENT:  Head: Normocephalic and atraumatic.  Eyes: EOM are normal.  Neck: Normal range of motion. Neck supple. No tracheal deviation present. No thyromegaly present.  Cardiovascular: Normal rate, S1 normal, S2 normal and normal heart sounds.  Exam reveals no gallop.   No murmur heard. Pulses:      Dorsalis pedis pulses are 1+  on the right side, and 1+ on the left side.       Posterior tibial pulses are 1+ on the right side, and 1+ on the left side.  Pulmonary/Chest: Breath sounds normal. No respiratory distress. He has no wheezes.  Abdominal: Soft. Bowel sounds are normal. He exhibits no distension. There is no tenderness. There is no guarding and no CVA tenderness.  Healing laparoscopic surgical wound.  Musculoskeletal: He exhibits no edema.       Right shoulder: He exhibits no swelling and no deformity.  Neurological: He is alert and oriented to person, place, and time. He has normal strength and normal reflexes. No cranial nerve deficit or sensory deficit. Gait normal.  Skin: Skin is warm and dry. No rash noted. No cyanosis. Nails show no clubbing.  Psychiatric: He has a normal mood and affect. His speech is normal and behavior is normal. Judgment and thought content normal. Cognition and memory are normal.    Results for orders placed or performed in visit on [id number]  Basic metabolic panel  Result Value Ref Range   Sodium 143 135 - 146 mmol/L  Potassium 3.5 3.5 - 5.3 mmol/L   Chloride 103 98 - 110 mmol/L   CO2 30 20 - 31 mmol/L   Glucose, Bld 183 (H) 65 - 99 mg/dL   BUN 16 7 - 25 mg/dL   Creat 0.71 0.70 - 1.25 mg/dL   Calcium 9.0 8.6 - 10.3 mg/dL  Hemoglobin A1c  Result Value Ref Range   Hgb A1c MFr Bld 7.5 (H) <5.7 %   Mean Plasma Glucose 169 (H) <117 mg/dL   Diabetic Labs (most recent): Lab Results  Component Value Date   HGBA1C 7.5* 05/01/2015   HGBA1C 7.3* 01/18/2013     Assessment & Plan:   1. Type 2 diabetes mellitus with vascular disease (HCC)  His diabetes is  complicated by CVA and patient remains at a high risk for more acute and chronic complications of diabetes which include CAD, CVA, CKD, retinopathy, and neuropathy. These are all discussed in detail with the patient.  Patient came  Prior to his scheduled visit with Increased A1c of 8.4% from 7.5% last visit .  Glucose logs  and insulin administration records pertaining to this visit,  to be scanned into patient's records.  Recent labs reviewed.   - I have re-counseled the patient on diet management and weight loss  by adopting a carbohydrate restricted / protein rich  Diet.  - Suggestion is made for patient to avoid simple carbohydrates   from their diet including Cakes , Desserts, Ice Cream,  Soda (  diet and regular) , Sweet Tea , Candies,  Chips, Cookies, Artificial Sweeteners,   and "Sugar-free" Products .  This will help patient to have stable blood glucose profile and potentially avoid unintended  Weight gain.  - Patient is advised to stick to a routine mealtimes to eat 3 meals  a day and avoid unnecessary snacks ( to snack only to correct hypoglycemia).  - The patient  has been  scheduled with Jearld Fenton, RDN, CDE for individualized DM education, and he is status post Roux-en-Y bariatric surgery.  - I have approached patient with the following individualized plan to manage diabetes and patient agrees.  -Due to moderate loss of control with his diabetes to A1c of 8.4%, I approached him to add low-dose metformin 500 mg by mouth twice a day. - Patient will be considered for low-dose Victoza if A1c remains higher than 7.5% during his next visit.  - Patient specific target  for A1c; LDL, HDL, Triglycerides, and  Waist Circumference were discussed in detail.  2) BP/HTN: Uncontrolled. Continue current medications including ACEI/ARB. 3) Lipids/HPL:  continue statins. 4)  Weight/Diet: He is status post Roux-en-Y bariatric surgery, CDE consult in progress, exercise, and carbohydrates information provided.  5) Chronic Care/Health Maintenance:  -Patient is on ACEI/ARB and Statin medications and encouraged to continue to follow up with Ophthalmology, Podiatrist at least yearly or according to recommendations, and advised to  stay away from smoking. I have recommended yearly flu vaccine and pneumonia vaccination at  least every 5 years; moderate intensity exercise for up to 150 minutes weekly; and  sleep for at least 7 hours a day.  - 25 minutes of time was spent on the care of this patient , 50% of which was applied for counseling on diabetes complications and their preventions.  6) Vitamin D deficiency I advised him to continue vitamin D.  - I advised patient to maintain close follow up with their PCP and he is bariatric team for ongoing care needs.  - Patient  is asked to bring meter and  blood glucose logs during their next visit.   Follow up plan: -Return in about 3 months (around 10/02/2015) for diabetes, high blood pressure, high cholesterol, follow up with pre-visit labs.  Glade Lloyd, MD Phone: [phone]  Fax: [phone]   07/03/2015, 3:35 PM

## 2015-07-03 NOTE — Patient Instructions (Signed)

## 2015-07-11 ENCOUNTER — Encounter: Payer: Self-pay | Admitting: "Endocrinology

## 2015-07-11 ENCOUNTER — Other Ambulatory Visit: Payer: Self-pay | Admitting: "Endocrinology

## 2015-08-29 ENCOUNTER — Ambulatory Visit: Payer: Medicare Other | Admitting: "Endocrinology

## 2015-09-29 ENCOUNTER — Other Ambulatory Visit: Payer: Self-pay | Admitting: "Endocrinology

## 2015-09-29 LAB — COMPLETE METABOLIC PANEL WITH GFR
ALT: 25 U/L (ref 9–46)
AST: 21 U/L (ref 10–35)
Albumin: 3.8 g/dL (ref 3.6–5.1)
Alkaline Phosphatase: 116 U/L — ABNORMAL HIGH (ref 40–115)
BUN: 20 mg/dL (ref 7–25)
CHLORIDE: 102 mmol/L (ref 98–110)
CO2: 31 mmol/L (ref 20–31)
Calcium: 8.9 mg/dL (ref 8.6–10.3)
Creat: 0.88 mg/dL (ref 0.70–1.25)
Glucose, Bld: 159 mg/dL — ABNORMAL HIGH (ref 65–99)
POTASSIUM: 4.1 mmol/L (ref 3.5–5.3)
SODIUM: 142 mmol/L (ref 135–146)
Total Bilirubin: 1.3 mg/dL — ABNORMAL HIGH (ref 0.2–1.2)
Total Protein: 6.3 g/dL (ref 6.1–8.1)

## 2015-09-29 LAB — HEMOGLOBIN A1C
Hgb A1c MFr Bld: 7.8 % — ABNORMAL HIGH (ref <5.7)
MEAN PLASMA GLUCOSE: 177 mg/dL

## 2015-10-05 ENCOUNTER — Encounter: Payer: Self-pay | Admitting: "Endocrinology

## 2015-10-05 ENCOUNTER — Ambulatory Visit (INDEPENDENT_AMBULATORY_CARE_PROVIDER_SITE_OTHER): Payer: Medicare Other | Admitting: "Endocrinology

## 2015-10-05 VITALS — BP 126/72 | HR 60 | Ht 70.0 in | Wt 226.0 lb

## 2015-10-05 DIAGNOSIS — I10 Essential (primary) hypertension: Secondary | ICD-10-CM

## 2015-10-05 DIAGNOSIS — E785 Hyperlipidemia, unspecified: Secondary | ICD-10-CM

## 2015-10-05 DIAGNOSIS — E1159 Type 2 diabetes mellitus with other circulatory complications: Secondary | ICD-10-CM

## 2015-10-05 DIAGNOSIS — E6609 Other obesity due to excess calories: Secondary | ICD-10-CM

## 2015-10-05 MED ORDER — METFORMIN HCL 500 MG PO TABS: 500.0000 mg | ORAL_TABLET | Freq: Two times a day (BID) | ORAL | 3 refills | Status: DC

## 2015-10-05 NOTE — Progress Notes (Signed)
Subjective:    Patient ID: Zachary Murillo., male    DOB: Aug 07, 1950, PCP Alonza Bogus, MD   Past Medical History:  Diagnosis Date  . Diabetes mellitus ([place])   . History of Roux-en-Y gastric bypass   . Hypertension   . Overweight(278.02)   . Sleep apnea   . Stroke [facility])    Past Surgical History:  Procedure Laterality Date  . BACK SURGERY    . CARDIAC CATHETERIZATION    . KNEE ARTHROSCOPY    . NASAL SEPTUM SURGERY     Social History   Social History  . Marital status: Married    Spouse name: N/A  . Number of children: N/A  . Years of education: Masters   Social History Main Topics  . Smoking status: Never Smoker  . Smokeless tobacco: Never Used  . Alcohol use Yes     Comment: occ  . Drug use: No  . Sexual activity: Not Currently   Other Topics Concern  . None   Social History Narrative  . None   Outpatient Encounter Prescriptions as of 10/05/2015  Medication Sig  . aspirin EC 81 MG tablet Take 81 mg by mouth once.   Marland Kitchen atorvastatin (LIPITOR) 20 MG tablet Take 20 mg by mouth at bedtime.  . Calcium Citrate-Vitamin D (CALCIUM + D PO) Take by mouth 2 (two) times daily.  . carvedilol (COREG) 25 MG tablet Take 25 mg by mouth 2 (two) times daily with a meal.  . cetirizine (ZYRTEC) 10 MG tablet Take 10 mg by mouth daily as needed for allergies.   . Cyanocobalamin 500 MCG/0.1ML SOLN Place into the nose.  . docusate sodium (COLACE) 100 MG capsule Take 100 mg by mouth 2 (two) times daily.  Marland Kitchen eplerenone (INSPRA) 50 MG tablet Take 100 mg by mouth 2 (two) times daily.  Marland Kitchen lisinopril (PRINIVIL,ZESTRIL) 40 MG tablet Take 20 mg by mouth every morning.  . metFORMIN (GLUCOPHAGE) 500 MG tablet Take 1 tablet (500 mg total) by mouth 2 (two) times daily with a meal.  . Multiple Vitamins-Minerals (CENTRUM PO) Take 1 tablet by mouth every morning.  . pantoprazole (PROTONIX) 40 MG tablet Take 40 mg by mouth daily.  . sertraline (ZOLOFT) 50 MG tablet Take 1 tablet (50 mg total)  by mouth daily.  . Sodium Hyaluronate (SUPARTZ) 25 MG/2.5ML SOSY One injection in right knee weekly for three weeks  . vitamin B-12 (CYANOCOBALAMIN) 1000 MCG tablet Take 1,000 mcg by mouth every morning.  . [DISCONTINUED] metFORMIN (GLUCOPHAGE) 500 MG tablet Take 1 tablet (500 mg total) by mouth 2 (two) times daily with a meal.   No facility-administered encounter medications on file as of 10/05/2015.    ALLERGIES: Allergies  Allergen Reactions  . Prednisone     Starts itching   VACCINATION STATUS:  There is no immunization history on file for this patient.  Diabetes  He presents for his follow-up diabetic visit. He has type 2 diabetes mellitus. Onset time: He was diagnosed at approximate age of 61 years. His disease course has been improving. There are no hypoglycemic associated symptoms. Pertinent negatives for hypoglycemia include no confusion, headaches, pallor or seizures. Pertinent negatives for diabetes include no chest pain, no fatigue, no polydipsia, no polyphagia, no polyuria and no weakness. There are no hypoglycemic complications. Symptoms are improving. Diabetic complications include a CVA. Risk factors for coronary artery disease include diabetes mellitus, dyslipidemia, hypertension, male sex, obesity and sedentary lifestyle. Current diabetic treatment includes insulin injections  and oral agent (dual therapy). He is compliant with treatment most of the time. His weight is decreasing steadily (After his Roux-en-Y surgery he lost 27 more pounds, making a total of 116 pounds weight loss.). He is following a generally unhealthy diet. He participates in exercise intermittently. An ACE inhibitor/angiotensin II receptor blocker is being taken. Eye exam is current.  Hyperlipidemia  This is a chronic problem. The current episode started more than 1 year ago. Pertinent negatives include no chest pain, myalgias or shortness of breath. Current antihyperlipidemic treatment includes statins.   Hypertension  This is a chronic problem. The current episode started more than 1 year ago. The problem is uncontrolled. Pertinent negatives include no chest pain, headaches, neck pain, palpitations or shortness of breath. Risk factors for coronary artery disease include diabetes mellitus, dyslipidemia, male gender and obesity. Hypertensive end-organ damage includes CVA.     Review of Systems  Constitutional: Negative for fatigue and unexpected weight change.       He kept losing weight, lost total of 123 pounds.  HENT: Negative for dental problem, mouth sores and trouble swallowing.   Eyes: Negative for visual disturbance.  Respiratory: Negative for cough, choking, chest tightness, shortness of breath and wheezing.   Cardiovascular: Negative for chest pain, palpitations and leg swelling.  Gastrointestinal: Negative for abdominal distention, abdominal pain, constipation, diarrhea, nausea and vomiting.  Endocrine: Negative for polydipsia, polyphagia and polyuria.  Genitourinary: Negative for dysuria, flank pain, hematuria and urgency.  Musculoskeletal: Negative for back pain, gait problem, myalgias and neck pain.  Skin: Negative for pallor, rash and wound.  Neurological: Negative for seizures, syncope, weakness, numbness and headaches.  Psychiatric/Behavioral: Negative.  Negative for confusion and dysphoric mood.    Objective:    BP 126/72   Pulse 60   Ht 5\' 10"  (1.778 m)   Wt 226 lb (102.5 kg)   BMI 32.43 kg/m   Wt Readings from Last 3 Encounters:  10/05/15 226 lb (102.5 kg)  07/03/15 233 lb (105.7 kg)  06/12/15 235 lb (106.6 kg)    Physical Exam  Constitutional: He is oriented to person, place, and time. He appears well-developed and well-nourished. He is cooperative. No distress.  HENT:  Head: Normocephalic and atraumatic.  Eyes: EOM are normal.  Neck: Normal range of motion. Neck supple. No tracheal deviation present. No thyromegaly present.  Cardiovascular: Normal rate,  S1 normal, S2 normal and normal heart sounds.  Exam reveals no gallop.   No murmur heard. Pulses:      Dorsalis pedis pulses are 1+ on the right side, and 1+ on the left side.       Posterior tibial pulses are 1+ on the right side, and 1+ on the left side.  Pulmonary/Chest: Breath sounds normal. No respiratory distress. He has no wheezes.  Abdominal: Soft. Bowel sounds are normal. He exhibits no distension. There is no tenderness. There is no guarding and no CVA tenderness.  Musculoskeletal: He exhibits no edema.       Right shoulder: He exhibits no swelling and no deformity.  Neurological: He is alert and oriented to person, place, and time. He has normal strength and normal reflexes. No cranial nerve deficit or sensory deficit. Gait normal.  Skin: Skin is warm and dry. No rash noted. No cyanosis. Nails show no clubbing.  Psychiatric: He has a normal mood and affect. His speech is normal and behavior is normal. Judgment and thought content normal. Cognition and memory are normal.    Results for  orders placed or performed in visit on 09/29/15  COMPLETE METABOLIC PANEL WITH GFR  Result Value Ref Range   Sodium 142 135 - 146 mmol/L   Potassium 4.1 3.5 - 5.3 mmol/L   Chloride 102 98 - 110 mmol/L   CO2 31 20 - 31 mmol/L   Glucose, Bld 159 (H) 65 - 99 mg/dL   BUN 20 7 - 25 mg/dL   Creat 0.88 0.70 - 1.25 mg/dL   Total Bilirubin 1.3 (H) 0.2 - 1.2 mg/dL   Alkaline Phosphatase 116 (H) 40 - 115 U/L   AST 21 10 - 35 U/L   ALT 25 9 - 46 U/L   Total Protein 6.3 6.1 - 8.1 g/dL   Albumin 3.8 3.6 - 5.1 g/dL   Calcium 8.9 8.6 - 10.3 mg/dL   GFR, Est African American >89 >=60 mL/min   GFR, Est Non African American >89 >=60 mL/min  Hemoglobin A1c  Result Value Ref Range   Hgb A1c MFr Bld 7.8 (H) <5.7 %   Mean Plasma Glucose 177 mg/dL   Diabetic Labs (most recent): Lab Results  Component Value Date   HGBA1C 7.8 (H) 09/29/2015   HGBA1C 8.4 06/29/2015   HGBA1C 7.5 (H) 05/01/2015      Assessment & Plan:   1. Type 2 diabetes mellitus with vascular disease (HCC)  His diabetes is  complicated by CVA and patient remains at a high risk for more acute and chronic complications of diabetes which include CAD, CVA, CKD, retinopathy, and neuropathy. These are all discussed in detail with the patient.   Glucose logs and insulin administration records pertaining to this visit,  to be scanned into patient's records.  Recent labs reviewed.   - I have re-counseled the patient on diet management and weight loss  by adopting a carbohydrate restricted / protein rich  Diet.  - Suggestion is made for patient to avoid simple carbohydrates   from their diet including Cakes , Desserts, Ice Cream,  Soda (  diet and regular) , Sweet Tea , Candies,  Chips, Cookies, Artificial Sweeteners,   and "Sugar-free" Products .  This will help patient to have stable blood glucose profile and potentially avoid unintended  Weight gain.  - Patient is advised to stick to a routine mealtimes to eat 3 meals  a day and avoid unnecessary snacks ( to snack only to correct hypoglycemia).  - The patient  has been  scheduled with Jearld Fenton, RDN, CDE for individualized DM education, and he is status post Roux-en-Y bariatric surgery.  - I have approached patient with the following individualized plan to manage diabetes and patient agrees.  - He misunderstood last visit instructions and did not start metformin. His A1c is 7.8%. I approached him to resume metformin 500 mg by mouth twice a day .  - Patient will be considered for low-dose Victoza if A1c remains higher than 7.5% during his next visit.  - Patient specific target  for A1c; LDL, HDL, Triglycerides, and  Waist Circumference were discussed in detail.  2) BP/HTN: controlled. Continue current medications including ACEI/ARB. 3) Lipids/HPL:  continue statins. 4)  Weight/Diet: He is status post Roux-en-Y bariatric surgery,  he lost a total of 123 pounds  overall.  CDE consult in progress, exercise, and carbohydrates information provided.  5) Chronic Care/Health Maintenance:  -Patient is on ACEI/ARB and Statin medications and encouraged to continue to follow up with Ophthalmology, Podiatrist at least yearly or according to recommendations, and advised to  stay  away from smoking. I have recommended yearly flu vaccine and pneumonia vaccination at least every 5 years; moderate intensity exercise for up to 150 minutes weekly; and  sleep for at least 7 hours a day.  - 25 minutes of time was spent on the care of this patient , 50% of which was applied for counseling on diabetes complications and their preventions.  6) Vitamin D deficiency I advised him to continue vitamin D, I will add vitamin B12 levels during his next labs.  - I advised patient to maintain close follow up with their PCP and he is bariatric team for ongoing care needs.  - Patient is asked to bring meter and  blood glucose logs during their next visit.   Follow up plan: -Return in about 3 months (around 01/05/2016) for follow up with pre-visit labs.  Glade Lloyd, MD Phone: [phone]  Fax: [phone]   10/05/2015, 9:43 AM

## 2016-01-04 ENCOUNTER — Other Ambulatory Visit: Payer: Self-pay | Admitting: "Endocrinology

## 2016-01-04 LAB — LIPID PANEL
CHOL/HDL RATIO: 2.2 ratio (ref <=5.0)
CHOLESTEROL: 98 mg/dL — AB (ref 125–200)
HDL: 44 mg/dL (ref >=40)
LDL Cholesterol: 41 mg/dL (ref <130)
Triglycerides: 67 mg/dL (ref <150)
VLDL: 13 mg/dL (ref <30)

## 2016-01-04 LAB — COMPLETE METABOLIC PANEL WITH GFR
ALBUMIN: 3.8 g/dL (ref 3.6–5.1)
ALK PHOS: 109 U/L (ref 40–115)
ALT: 23 U/L (ref 9–46)
AST: 19 U/L (ref 10–35)
BUN: 18 mg/dL (ref 7–25)
CHLORIDE: 102 mmol/L (ref 98–110)
CO2: 32 mmol/L — ABNORMAL HIGH (ref 20–31)
Calcium: 8.8 mg/dL (ref 8.6–10.3)
Creat: 0.76 mg/dL (ref 0.70–1.25)
GFR, Est African American: >89 mL/min (ref >=60)
GLUCOSE: 148 mg/dL — AB (ref 65–99)
POTASSIUM: 3.8 mmol/L (ref 3.5–5.3)
SODIUM: 141 mmol/L (ref 135–146)
Total Bilirubin: 1 mg/dL (ref 0.2–1.2)
Total Protein: 6.5 g/dL (ref 6.1–8.1)

## 2016-01-04 LAB — HEMOGLOBIN A1C
HEMOGLOBIN A1C: 6.5 % — AB (ref <5.7)
Mean Plasma Glucose: 140 mg/dL

## 2016-01-04 LAB — T4, FREE: Free T4: 1.1 ng/dL (ref 0.8–1.8)

## 2016-01-04 LAB — VITAMIN B12: Vitamin B-12: 939 pg/mL (ref 200–1100)

## 2016-01-04 LAB — TSH: TSH: 3.68 mIU/L (ref 0.40–4.50)

## 2016-01-05 LAB — VITAMIN D 25 HYDROXY (VIT D DEFICIENCY, FRACTURES): Vit D, 25-Hydroxy: 43 ng/mL (ref 30–100)

## 2016-01-10 ENCOUNTER — Telehealth: Payer: Self-pay

## 2016-01-10 NOTE — Telephone Encounter (Signed)
Error

## 2016-01-11 ENCOUNTER — Encounter: Payer: Self-pay | Admitting: "Endocrinology

## 2016-01-11 ENCOUNTER — Ambulatory Visit (INDEPENDENT_AMBULATORY_CARE_PROVIDER_SITE_OTHER): Payer: Medicare Other | Admitting: "Endocrinology

## 2016-01-11 VITALS — BP 142/68 | HR 74 | Resp 18 | Ht 70.0 in | Wt 227.0 lb

## 2016-01-11 DIAGNOSIS — E782 Mixed hyperlipidemia: Secondary | ICD-10-CM

## 2016-01-11 DIAGNOSIS — I10 Essential (primary) hypertension: Secondary | ICD-10-CM

## 2016-01-11 DIAGNOSIS — Z6832 Body mass index (BMI) 32.0-32.9, adult: Secondary | ICD-10-CM | POA: Diagnosis not present

## 2016-01-11 DIAGNOSIS — E1159 Type 2 diabetes mellitus with other circulatory complications: Secondary | ICD-10-CM | POA: Diagnosis not present

## 2016-01-11 DIAGNOSIS — E6609 Other obesity due to excess calories: Secondary | ICD-10-CM | POA: Diagnosis not present

## 2016-01-11 MED ORDER — ATORVASTATIN CALCIUM 10 MG PO TABS: 10.0000 mg | ORAL_TABLET | Freq: Every day | ORAL | 6 refills | Status: DC

## 2016-01-11 NOTE — Progress Notes (Signed)
Subjective:    Patient ID: Zachary Shadow., male    DOB: Sep 07, 1950, PCP Alonza Bogus, MD   Past Medical History:  Diagnosis Date  . Diabetes mellitus ([place])   . History of Roux-en-Y gastric bypass   . Hypertension   . Overweight(278.02)   . Sleep apnea   . Stroke [facility])    Past Surgical History:  Procedure Laterality Date  . BACK SURGERY    . CARDIAC CATHETERIZATION    . KNEE ARTHROSCOPY    . NASAL SEPTUM SURGERY     Social History   Social History  . Marital status: Married    Spouse name: N/A  . Number of children: N/A  . Years of education: Masters   Social History Main Topics  . Smoking status: Never Smoker  . Smokeless tobacco: Never Used  . Alcohol use Yes     Comment: occ  . Drug use: No  . Sexual activity: Not Currently   Other Topics Concern  . None   Social History Narrative  . None   Outpatient Encounter Prescriptions as of 01/11/2016  Medication Sig  . aspirin EC 81 MG tablet Take 81 mg by mouth once.   Marland Kitchen atorvastatin (LIPITOR) 10 MG tablet Take 1 tablet (10 mg total) by mouth at bedtime.  . Calcium Citrate-Vitamin D (CALCIUM + D PO) Take by mouth 2 (two) times daily.  . carvedilol (COREG) 25 MG tablet Take 25 mg by mouth 2 (two) times daily with a meal.  . cetirizine (ZYRTEC) 10 MG tablet Take 10 mg by mouth daily as needed for allergies.   . Cyanocobalamin 500 MCG/0.1ML SOLN Place into the nose.  . docusate sodium (COLACE) 100 MG capsule Take 100 mg by mouth 2 (two) times daily.  Marland Kitchen eplerenone (INSPRA) 50 MG tablet Take 100 mg by mouth 2 (two) times daily.  Marland Kitchen lisinopril (PRINIVIL,ZESTRIL) 40 MG tablet Take 20 mg by mouth every morning.  . metFORMIN (GLUCOPHAGE) 500 MG tablet Take 1 tablet (500 mg total) by mouth 2 (two) times daily with a meal.  . Multiple Vitamins-Minerals (CENTRUM PO) Take 1 tablet by mouth every morning.  . pantoprazole (PROTONIX) 40 MG tablet Take 40 mg by mouth daily.  . sertraline (ZOLOFT) 50 MG tablet Take 1  tablet (50 mg total) by mouth daily.  . Sodium Hyaluronate (SUPARTZ) 25 MG/2.5ML SOSY One injection in right knee weekly for three weeks  . vitamin B-12 (CYANOCOBALAMIN) 1000 MCG tablet Take 1,000 mcg by mouth every morning.  . [DISCONTINUED] atorvastatin (LIPITOR) 20 MG tablet Take 20 mg by mouth at bedtime.   No facility-administered encounter medications on file as of 01/11/2016.    ALLERGIES: Allergies  Allergen Reactions  . Prednisone     Starts itching   VACCINATION STATUS:  There is no immunization history on file for this patient.  Diabetes  He presents for his follow-up diabetic visit. He has type 2 diabetes mellitus. Onset time: He was diagnosed at approximate age of 81 years. His disease course has been improving. There are no hypoglycemic associated symptoms. Pertinent negatives for hypoglycemia include no confusion, headaches, pallor or seizures. Pertinent negatives for diabetes include no chest pain, no fatigue, no polydipsia, no polyphagia, no polyuria and no weakness. There are no hypoglycemic complications. Symptoms are improving. Diabetic complications include a CVA. Risk factors for coronary artery disease include diabetes mellitus, dyslipidemia, hypertension, male sex, obesity and sedentary lifestyle. Current diabetic treatment includes insulin injections and oral agent (dual therapy).  He is compliant with treatment most of the time. His weight is stable (After his Roux-en-Y surgery he lost 27 more pounds, making a total of 116 pounds weight loss.). He is following a generally unhealthy diet. He participates in exercise intermittently. An ACE inhibitor/angiotensin II receptor blocker is being taken. Eye exam is current.  Hyperlipidemia  This is a chronic problem. The current episode started more than 1 year ago. Pertinent negatives include no chest pain, myalgias or shortness of breath. Current antihyperlipidemic treatment includes statins.  Hypertension  This is a chronic  problem. The current episode started more than 1 year ago. The problem is uncontrolled. Pertinent negatives include no chest pain, headaches, neck pain, palpitations or shortness of breath. Risk factors for coronary artery disease include diabetes mellitus, dyslipidemia, male gender and obesity. Hypertensive end-organ damage includes CVA.     Review of Systems  Constitutional: Negative for fatigue and unexpected weight change.       He kept losing weight, lost total of 123 pounds.  HENT: Negative for dental problem, mouth sores and trouble swallowing.   Eyes: Negative for visual disturbance.  Respiratory: Negative for cough, choking, chest tightness, shortness of breath and wheezing.   Cardiovascular: Negative for chest pain, palpitations and leg swelling.  Gastrointestinal: Negative for abdominal distention, abdominal pain, constipation, diarrhea, nausea and vomiting.  Endocrine: Negative for polydipsia, polyphagia and polyuria.  Genitourinary: Negative for dysuria, flank pain, hematuria and urgency.  Musculoskeletal: Negative for back pain, gait problem, myalgias and neck pain.  Skin: Negative for pallor, rash and wound.  Neurological: Negative for seizures, syncope, weakness, numbness and headaches.  Psychiatric/Behavioral: Negative.  Negative for confusion and dysphoric mood.    Objective:    BP (!) 142/68   Pulse 74   Resp 18   Ht 5\' 10"  (1.778 m)   Wt 227 lb (103 kg)   SpO2 94%   BMI 32.57 kg/m   Wt Readings from Last 3 Encounters:  01/11/16 227 lb (103 kg)  10/05/15 226 lb (102.5 kg)  07/03/15 233 lb (105.7 kg)    Physical Exam  Constitutional: He is oriented to person, place, and time. He appears well-developed and well-nourished. He is cooperative. No distress.  HENT:  Head: Normocephalic and atraumatic.  Eyes: EOM are normal.  Neck: Normal range of motion. Neck supple. No tracheal deviation present. No thyromegaly present.  Cardiovascular: Normal rate, S1 normal, S2  normal and normal heart sounds.  Exam reveals no gallop.   No murmur heard. Pulses:      Dorsalis pedis pulses are 1+ on the right side, and 1+ on the left side.       Posterior tibial pulses are 1+ on the right side, and 1+ on the left side.  Pulmonary/Chest: Breath sounds normal. No respiratory distress. He has no wheezes.  Abdominal: Soft. Bowel sounds are normal. He exhibits no distension. There is no tenderness. There is no guarding and no CVA tenderness.  Musculoskeletal: He exhibits no edema.       Right shoulder: He exhibits no swelling and no deformity.  Neurological: He is alert and oriented to person, place, and time. He has normal strength and normal reflexes. No cranial nerve deficit or sensory deficit. Gait normal.  Skin: Skin is warm and dry. No rash noted. No cyanosis. Nails show no clubbing.  Psychiatric: He has a normal mood and affect. His speech is normal and behavior is normal. Judgment and thought content normal. Cognition and memory are normal.  Results for orders placed or performed in visit on 01/04/16  COMPLETE METABOLIC PANEL WITH GFR  Result Value Ref Range   Sodium 141 135 - 146 mmol/L   Potassium 3.8 3.5 - 5.3 mmol/L   Chloride 102 98 - 110 mmol/L   CO2 32 (H) 20 - 31 mmol/L   Glucose, Bld 148 (H) 65 - 99 mg/dL   BUN 18 7 - 25 mg/dL   Creat 0.76 0.70 - 1.25 mg/dL   Total Bilirubin 1.0 0.2 - 1.2 mg/dL   Alkaline Phosphatase 109 40 - 115 U/L   AST 19 10 - 35 U/L   ALT 23 9 - 46 U/L   Total Protein 6.5 6.1 - 8.1 g/dL   Albumin 3.8 3.6 - 5.1 g/dL   Calcium 8.8 8.6 - 10.3 mg/dL   GFR, Est African American >89 >=60 mL/min   GFR, Est Non African American >89 >=60 mL/min  Lipid panel  Result Value Ref Range   Cholesterol 98 (L) 125 - 200 mg/dL   Triglycerides 67 <150 mg/dL   HDL 44 >=40 mg/dL   Total CHOL/HDL Ratio 2.2 <=5.0 Ratio   VLDL 13 <30 mg/dL   LDL Cholesterol 41 <130 mg/dL  TSH  Result Value Ref Range   TSH 3.68 0.40 - 4.50 mIU/L  T4, free   Result Value Ref Range   Free T4 1.1 0.8 - 1.8 ng/dL  Vitamin B12  Result Value Ref Range   Vitamin B-12 939 200 - 1,100 pg/mL  Hemoglobin A1c  Result Value Ref Range   Hgb A1c MFr Bld 6.5 (H) <5.7 %   Mean Plasma Glucose 140 mg/dL  VITAMIN D 25 Hydroxy (Vit-D Deficiency, Fractures)  Result Value Ref Range   Vit D, 25-Hydroxy 43 30 - 100 ng/mL   Diabetic Labs (most recent): Lab Results  Component Value Date   HGBA1C 6.5 (H) 01/04/2016   HGBA1C 7.8 (H) 09/29/2015   HGBA1C 8.4 06/29/2015     Assessment & Plan:   1. Type 2 diabetes mellitus with vascular disease (HCC)  His diabetes is  complicated by CVA and patient remains at a high risk for more acute and chronic complications of diabetes which include CAD, CVA, CKD, retinopathy, and neuropathy. These are all discussed in detail with the patient.   Glucose logs and insulin administration records pertaining to this visit,  to be scanned into patient's records.  Recent labs reviewed.   - I have re-counseled the patient on diet management and weight loss  by adopting a carbohydrate restricted / protein rich  Diet.  - Suggestion is made for patient to avoid simple carbohydrates   from their diet including Cakes , Desserts, Ice Cream,  Soda (  diet and regular) , Sweet Tea , Candies,  Chips, Cookies, Artificial Sweeteners,   and "Sugar-free" Products .  This will help patient to have stable blood glucose profile and potentially avoid unintended  Weight gain.  - Patient is advised to stick to a routine mealtimes to eat 3 meals  a day and avoid unnecessary snacks ( to snack only to correct hypoglycemia).  - The patient  has been  scheduled with Jearld Fenton, RDN, CDE for individualized DM education, and he is status post Roux-en-Y bariatric surgery.  - I have approached patient with the following individualized plan to manage diabetes and patient agrees.  - He Stayed on metformin 500 mg by mouth twice a day. His A1c is 6.5  improving from 7.8%. I advised him  to stay on metformin 500 mg by mouth twice a day .  - Patient will be considered for low-dose Victoza if A1c increase is too higher than 7.5% during his next visit.  - Patient specific target  for A1c; LDL, HDL, Triglycerides, and  Waist Circumference were discussed in detail.  2) BP/HTN: uncontrolled. Continue current medications including ACEI/ARB. I advised him to continue follow-up in hypertension clinic. 3) Lipids/HPL: Controlled, I advised him to lower Lipitor to 10 mg by mouth daily at bedtime. 4)  Weight/Diet: He is status post Roux-en-Y bariatric surgery,  he lost a total of 123 pounds overall.  CDE consult in progress, exercise, and carbohydrates information provided.  5) Chronic Care/Health Maintenance:  -Patient is on ACEI/ARB and Statin medications and encouraged to continue to follow up with Ophthalmology, Podiatrist at least yearly or according to recommendations, and advised to  stay away from smoking. I have recommended yearly flu vaccine and pneumonia vaccination at least every 5 years; moderate intensity exercise for up to 150 minutes weekly; and  sleep for at least 7 hours a day.  - 25 minutes of time was spent on the care of this patient , 50% of which was applied for counseling on diabetes complications and their preventions.  6) Vitamin D deficiency I advised him to continue vitamin D, I will add vitamin B12 levels during his next labs.  - I advised patient to maintain close follow up with their PCP and he is bariatric team for ongoing care needs.  - Patient is asked to bring meter and  blood glucose logs during their next visit.   Follow up plan: -Return in about 6 months (around 07/10/2016) for follow up with pre-visit labs.  Glade Lloyd, MD Phone: [phone]  Fax: [phone]   01/11/2016, 9:44 AM

## 2016-06-14 ENCOUNTER — Other Ambulatory Visit: Payer: Self-pay | Admitting: "Endocrinology

## 2016-07-09 ENCOUNTER — Other Ambulatory Visit: Payer: Self-pay | Admitting: "Endocrinology

## 2016-07-10 LAB — COMPREHENSIVE METABOLIC PANEL
ALBUMIN: 4.5 g/dL (ref 3.6–4.8)
ALT: 22 IU/L (ref 0–44)
AST: 16 IU/L (ref 0–40)
Albumin/Globulin Ratio: 1.8 (ref 1.2–2.2)
Alkaline Phosphatase: 138 IU/L — ABNORMAL HIGH (ref 39–117)
BUN / CREAT RATIO: 23 (ref 10–24)
BUN: 15 mg/dL (ref 8–27)
Bilirubin Total: 1 mg/dL (ref 0.0–1.2)
CALCIUM: 9.6 mg/dL (ref 8.6–10.2)
CO2: 32 mmol/L — AB (ref 18–29)
CREATININE: 0.64 mg/dL — AB (ref 0.76–1.27)
Chloride: 99 mmol/L (ref 96–106)
GFR calc Af Amer: 118 mL/min/{1.73_m2} (ref >59)
GFR, EST NON AFRICAN AMERICAN: 102 mL/min/{1.73_m2} (ref >59)
GLOBULIN, TOTAL: 2.5 g/dL (ref 1.5–4.5)
Glucose: 168 mg/dL — ABNORMAL HIGH (ref 65–99)
Potassium: 4.1 mmol/L (ref 3.5–5.2)
SODIUM: 144 mmol/L (ref 134–144)
Total Protein: 7 g/dL (ref 6.0–8.5)

## 2016-07-10 LAB — HGB A1C W/O EAG: Hgb A1c MFr Bld: 7.3 % — ABNORMAL HIGH (ref 4.8–5.6)

## 2016-07-10 LAB — AMBIG ABBREV CMP14 DEFAULT

## 2016-07-11 ENCOUNTER — Ambulatory Visit (INDEPENDENT_AMBULATORY_CARE_PROVIDER_SITE_OTHER): Payer: Medicare Other | Admitting: "Endocrinology

## 2016-07-11 ENCOUNTER — Encounter: Payer: Self-pay | Admitting: "Endocrinology

## 2016-07-11 VITALS — BP 160/83 | HR 82 | Ht 70.0 in | Wt 231.0 lb

## 2016-07-11 DIAGNOSIS — E1159 Type 2 diabetes mellitus with other circulatory complications: Secondary | ICD-10-CM | POA: Diagnosis not present

## 2016-07-11 DIAGNOSIS — E6609 Other obesity due to excess calories: Secondary | ICD-10-CM

## 2016-07-11 DIAGNOSIS — E782 Mixed hyperlipidemia: Secondary | ICD-10-CM

## 2016-07-11 DIAGNOSIS — I10 Essential (primary) hypertension: Secondary | ICD-10-CM

## 2016-07-11 DIAGNOSIS — Z6832 Body mass index (BMI) 32.0-32.9, adult: Secondary | ICD-10-CM

## 2016-07-11 MED ORDER — METFORMIN HCL 1000 MG PO TABS: 1000.0000 mg | ORAL_TABLET | Freq: Two times a day (BID) | ORAL | 2 refills | Status: DC

## 2016-07-11 NOTE — Progress Notes (Signed)
Subjective:    Patient ID: Zachary Murillo., male    DOB: [birth year], PCP Alonza Bogus, MD   Past Medical History:  Diagnosis Date  . Diabetes mellitus ([place])   . History of Roux-en-Y gastric bypass   . Hypertension   . Overweight(278.02)   . Sleep apnea   . Stroke [facility])    Past Surgical History:  Procedure Laterality Date  . BACK SURGERY    . CARDIAC CATHETERIZATION    . KNEE ARTHROSCOPY    . NASAL SEPTUM SURGERY     Social History   Social History  . Marital status: Married    Spouse name: N/A  . Number of children: N/A  . Years of education: Masters   Social History Main Topics  . Smoking status: Never Smoker  . Smokeless tobacco: Never Used  . Alcohol use Yes     Comment: occ  . Drug use: No  . Sexual activity: Not Currently   Other Topics Concern  . None   Social History Narrative  . None   Outpatient Encounter Prescriptions as of 07/11/2016  Medication Sig  . amLODipine (NORVASC) 10 MG tablet Take 20 mg by mouth daily.   . Cholecalciferol (VITAMIN D) 2000 units CAPS Take by mouth.  Marland Kitchen aspirin EC 81 MG tablet Take 81 mg by mouth once.   Marland Kitchen atorvastatin (LIPITOR) 10 MG tablet Take 1 tablet (10 mg total) by mouth at bedtime.  . Calcium Citrate-Vitamin D (CALCIUM + D PO) Take by mouth 2 (two) times daily.  . carvedilol (COREG) 25 MG tablet Take 12.5 mg by mouth 2 (two) times daily with a meal.   . cetirizine (ZYRTEC) 10 MG tablet Take 10 mg by mouth daily as needed for allergies.   . Cyanocobalamin 500 MCG/0.1ML SOLN Place into the nose.  . docusate sodium (COLACE) 100 MG capsule Take 100 mg by mouth 2 (two) times daily.  Marland Kitchen eplerenone (INSPRA) 50 MG tablet Take 150 mg by mouth 2 (two) times daily.   Marland Kitchen lisinopril (PRINIVIL,ZESTRIL) 40 MG tablet Take 40 mg by mouth every morning.  . metFORMIN (GLUCOPHAGE) 1000 MG tablet Take 1 tablet (1,000 mg total) by mouth 2 (two) times daily with a meal.  . Multiple Vitamins-Minerals (CENTRUM PO) Take 1 tablet by  mouth every morning.  . sertraline (ZOLOFT) 50 MG tablet Take 1 tablet (50 mg total) by mouth daily.  . vitamin B-12 (CYANOCOBALAMIN) 1000 MCG tablet Take 1,000 mcg by mouth every morning.  . [DISCONTINUED] metFORMIN (GLUCOPHAGE) 500 MG tablet TAKE 1 TABLET BY MOUTH TWICE DAILY WITH A MEAL.  . [DISCONTINUED] pantoprazole (PROTONIX) 40 MG tablet Take 40 mg by mouth daily.  . [DISCONTINUED] Sodium Hyaluronate (SUPARTZ) 25 MG/2.5ML SOSY One injection in right knee weekly for three weeks   No facility-administered encounter medications on file as of 07/11/2016.    ALLERGIES: Allergies  Allergen Reactions  . Prednisone     Starts itching   VACCINATION STATUS:  There is no immunization history on file for this patient.  Diabetes  He presents for his follow-up diabetic visit. He has type 2 diabetes mellitus. Onset time: He was diagnosed at approximate age of 50 years. His disease course has been stable. There are no hypoglycemic associated symptoms. Pertinent negatives for hypoglycemia include no confusion, headaches, pallor or seizures. Pertinent negatives for diabetes include no chest pain, no fatigue, no polydipsia, no polyphagia, no polyuria and no weakness. There are no hypoglycemic complications. Symptoms are stable. Diabetic  complications include a CVA. Risk factors for coronary artery disease include diabetes mellitus, dyslipidemia, hypertension, male sex, obesity and sedentary lifestyle. Current diabetic treatment includes insulin injections and oral agent (dual therapy). He is compliant with treatment most of the time. His weight is increasing steadily. He participates in exercise intermittently. An ACE inhibitor/angiotensin II receptor blocker is being taken. Eye exam is current.  Hyperlipidemia  This is a chronic problem. The current episode started more than 1 year ago. Pertinent negatives include no chest pain, myalgias or shortness of breath. Current antihyperlipidemic treatment includes  statins.  Hypertension  This is a chronic problem. The current episode started more than 1 year ago. The problem is uncontrolled. Pertinent negatives include no chest pain, headaches, neck pain, palpitations or shortness of breath. Risk factors for coronary artery disease include diabetes mellitus, dyslipidemia, male gender and obesity. Hypertensive end-organ damage includes CVA.    Review of Systems  Constitutional: Negative for fatigue and unexpected weight change.       He kept losing weight, lost total of 120 pounds. He has gained 4 pounds since last visit.  HENT: Negative for dental problem, mouth sores and trouble swallowing.   Eyes: Negative for visual disturbance.  Respiratory: Negative for cough, choking, chest tightness, shortness of breath and wheezing.   Cardiovascular: Negative for chest pain, palpitations and leg swelling.  Gastrointestinal: Negative for abdominal distention, abdominal pain, constipation, diarrhea, nausea and vomiting.  Endocrine: Negative for polydipsia, polyphagia and polyuria.  Genitourinary: Negative for dysuria, flank pain, hematuria and urgency.  Musculoskeletal: Negative for back pain, gait problem, myalgias and neck pain.  Skin: Negative for pallor, rash and wound.  Neurological: Negative for seizures, syncope, weakness, numbness and headaches.  Psychiatric/Behavioral: Negative.  Negative for confusion and dysphoric mood.    Objective:    BP (!) 160/83   Pulse 82   Ht 5\' 10"  (1.778 m)   Wt 231 lb (104.8 kg)   BMI 33.15 kg/m   Wt Readings from Last 3 Encounters:  07/11/16 231 lb (104.8 kg)  01/11/16 227 lb (103 kg)  10/05/15 226 lb (102.5 kg)    Physical Exam  Constitutional: He is oriented to person, place, and time. He appears well-developed and well-nourished. He is cooperative. No distress.  HENT:  Head: Normocephalic and atraumatic.  Eyes: EOM are normal.  Neck: Normal range of motion. Neck supple. No tracheal deviation present. No  thyromegaly present.  Cardiovascular: Normal rate, S1 normal, S2 normal and normal heart sounds.  Exam reveals no gallop.   No murmur heard. Pulses:      Dorsalis pedis pulses are 1+ on the right side, and 1+ on the left side.       Posterior tibial pulses are 1+ on the right side, and 1+ on the left side.  Pulmonary/Chest: Breath sounds normal. No respiratory distress. He has no wheezes.  Abdominal: Soft. Bowel sounds are normal. He exhibits no distension. There is no tenderness. There is no guarding and no CVA tenderness.  Musculoskeletal: He exhibits no edema.       Right shoulder: He exhibits no swelling and no deformity.  Neurological: He is alert and oriented to person, place, and time. He has normal strength and normal reflexes. No cranial nerve deficit or sensory deficit. Gait normal.  Skin: Skin is warm and dry. No rash noted. No cyanosis. Nails show no clubbing.  Psychiatric: He has a normal mood and affect. His speech is normal and behavior is normal. Judgment and thought content  normal. Cognition and memory are normal.    Results for orders placed or performed in visit on 07/09/16  Comprehensive metabolic panel  Result Value Ref Range   Glucose 168 (H) 65 - 99 mg/dL   BUN 15 8 - 27 mg/dL   Creatinine, Ser 0.64 (L) 0.76 - 1.27 mg/dL   GFR calc non Af Amer 102 >59 mL/min/1.73   GFR calc Af Amer 118 >59 mL/min/1.73   BUN/Creatinine Ratio 23 10 - 24   Sodium 144 134 - 144 mmol/L   Potassium 4.1 3.5 - 5.2 mmol/L   Chloride 99 96 - 106 mmol/L   CO2 32 (H) 18 - 29 mmol/L   Calcium 9.6 8.6 - 10.2 mg/dL   Total Protein 7.0 6.0 - 8.5 g/dL   Albumin 4.5 3.6 - 4.8 g/dL   Globulin, Total 2.5 1.5 - 4.5 g/dL   Albumin/Globulin Ratio 1.8 1.2 - 2.2   Bilirubin Total 1.0 0.0 - 1.2 mg/dL   Alkaline Phosphatase 138 (H) 39 - 117 IU/L   AST 16 0 - 40 IU/L   ALT 22 0 - 44 IU/L  Hgb A1c w/o eAG  Result Value Ref Range   Hgb A1c MFr Bld 7.3 (H) 4.8 - 5.6 %  Ambig Abbrev CMP14 Default   Result Value Ref Range   Ambig Abbrev CMP14 Default Comment    Diabetic Labs (most recent): Lab Results  Component Value Date   HGBA1C 7.3 (H) 07/09/2016   HGBA1C 6.5 (H) 01/04/2016   HGBA1C 7.8 (H) 09/29/2015     Assessment & Plan:   1. Type 2 diabetes mellitus with vascular disease (HCC)  His diabetes is  complicated by CVA and patient remains at a high risk for more acute and chronic complications of diabetes which include CAD, CVA, CKD, retinopathy, and neuropathy. These are all discussed in detail with the patient.   Glucose logs and insulin administration records pertaining to this visit,  to be scanned into patient's records.  Recent labs reviewed.   - I have re-counseled the patient on diet management and weight loss  by adopting a carbohydrate restricted / protein rich  Diet.  - Suggestion is made for patient to avoid simple carbohydrates   from their diet including Cakes , Desserts, Ice Cream,  Soda (  diet and regular) , Sweet Tea , Candies,  Chips, Cookies, Artificial Sweeteners,   and "Sugar-free" Products .  This will help patient to have stable blood glucose profile and potentially avoid unintended  Weight gain.  - Patient is advised to stick to a routine mealtimes to eat 3 meals  a day and avoid unnecessary snacks ( to snack only to correct hypoglycemia).  - The patient  has been  scheduled with Jearld Fenton, RDN, CDE for individualized DM education, and he is status post Roux-en-Y bariatric surgery.  - I have approached patient with the following individualized plan to manage diabetes and patient agrees.  -  His A1c is higher at 7.3% from 6.5% during last visit. I advised him to increase metformin to 1000 mg by mouth twice a day.  - Patient will be considered for low-dose Victoza if A1c increase is to higher than 7.5% during his next visit.  - Patient specific target  for A1c; LDL, HDL, Triglycerides, and  Waist Circumference were discussed in detail.  2)  BP/HTN: uncontrolled. Continue current medications including lisinopril 40 minute grams by mouth daily, amlodipine 10 mg by mouth daily, Coreg 12.5 mg by mouth twice  a day. I advised him to continue follow-up in hypertension clinic.  3) Lipids/HPL: Controlled, I advised him to lower Lipitor to 10 mg by mouth daily at bedtime.  4)  Weight/Diet: He is status post Roux-en-Y bariatric surgery,  he lost a total of 120 pounds overall.   CDE consult in progress, exercise, and carbohydrates information provided.  5) Chronic Care/Health Maintenance:  -Patient is on ACEI/ARB and Statin medications and encouraged to continue to follow up with Ophthalmology, Podiatrist at least yearly or according to recommendations, and advised to  stay away from smoking. I have recommended yearly flu vaccine and pneumonia vaccination at least every 5 years; moderate intensity exercise for up to 150 minutes weekly; and  sleep for at least 7 hours a day.  - 25 minutes of time was spent on the care of this patient , 50% of which was applied for counseling on diabetes complications and their preventions.  6) Vitamin D deficiency I advised him to continue vitamin D, I will add vitamin B12 levels during his next labs.  - I advised patient to maintain close follow up with their PCP and he is bariatric team for ongoing care needs.  - Patient is asked to bring meter and  blood glucose logs during their next visit.   Follow up plan: -Return in about 3 months (around 10/11/2016) for follow up with pre-visit labs.  Glade Lloyd, MD Phone: [phone]  Fax: [phone]   07/11/2016, 10:47 AM

## 2016-08-31 ENCOUNTER — Other Ambulatory Visit: Payer: Self-pay | Admitting: "Endocrinology

## 2016-10-14 ENCOUNTER — Ambulatory Visit: Payer: Medicare Other | Admitting: Orthopedic Surgery

## 2016-10-19 LAB — CMP14+EGFR
A/G RATIO: 1.6 (ref 1.2–2.2)
ALT: 24 IU/L (ref 0–44)
AST: 24 IU/L (ref 0–40)
Albumin: 4.2 g/dL (ref 3.6–4.8)
Alkaline Phosphatase: 111 IU/L (ref 39–117)
BUN/Creatinine Ratio: 21 (ref 10–24)
BUN: 13 mg/dL (ref 8–27)
Bilirubin Total: 1 mg/dL (ref 0.0–1.2)
CALCIUM: 9.2 mg/dL (ref 8.6–10.2)
CO2: 29 mmol/L (ref 20–29)
CREATININE: 0.63 mg/dL — AB (ref 0.76–1.27)
Chloride: 99 mmol/L (ref 96–106)
GFR, EST AFRICAN AMERICAN: 119 mL/min/{1.73_m2} (ref >59)
GFR, EST NON AFRICAN AMERICAN: 103 mL/min/{1.73_m2} (ref >59)
Globulin, Total: 2.6 g/dL (ref 1.5–4.5)
Glucose: 143 mg/dL — ABNORMAL HIGH (ref 65–99)
POTASSIUM: 3.6 mmol/L (ref 3.5–5.2)
Sodium: 142 mmol/L (ref 134–144)
TOTAL PROTEIN: 6.8 g/dL (ref 6.0–8.5)

## 2016-10-19 LAB — TSH: TSH: 2.12 u[IU]/mL (ref 0.450–4.500)

## 2016-10-19 LAB — T4, FREE: FREE T4: 1.21 ng/dL (ref 0.82–1.77)

## 2016-10-28 ENCOUNTER — Encounter: Payer: Self-pay | Admitting: "Endocrinology

## 2016-10-28 ENCOUNTER — Ambulatory Visit (INDEPENDENT_AMBULATORY_CARE_PROVIDER_SITE_OTHER): Payer: Medicare Other | Admitting: "Endocrinology

## 2016-10-28 VITALS — BP 131/86 | HR 60 | Ht 70.0 in | Wt 230.0 lb

## 2016-10-28 DIAGNOSIS — E6609 Other obesity due to excess calories: Secondary | ICD-10-CM | POA: Diagnosis not present

## 2016-10-28 DIAGNOSIS — Z6832 Body mass index (BMI) 32.0-32.9, adult: Secondary | ICD-10-CM | POA: Diagnosis not present

## 2016-10-28 DIAGNOSIS — E1159 Type 2 diabetes mellitus with other circulatory complications: Secondary | ICD-10-CM

## 2016-10-28 DIAGNOSIS — I10 Essential (primary) hypertension: Secondary | ICD-10-CM | POA: Diagnosis not present

## 2016-10-28 DIAGNOSIS — E782 Mixed hyperlipidemia: Secondary | ICD-10-CM

## 2016-10-28 NOTE — Progress Notes (Signed)
Subjective:    Patient ID: Zachary Murillo., male    DOB: 1950/06/10, PCP Sinda Du, MD   Past Medical History:  Diagnosis Date  . Diabetes mellitus ([place])   . History of Roux-en-Y gastric bypass   . Hypertension   . Overweight(278.02)   . Sleep apnea   . Stroke [facility])    Past Surgical History:  Procedure Laterality Date  . BACK SURGERY    . CARDIAC CATHETERIZATION    . KNEE ARTHROSCOPY    . NASAL SEPTUM SURGERY     Social History   Social History  . Marital status: Married    Spouse name: N/A  . Number of children: N/A  . Years of education: Masters   Social History Main Topics  . Smoking status: Never Smoker  . Smokeless tobacco: Never Used  . Alcohol use Yes     Comment: occ  . Drug use: No  . Sexual activity: Not Currently   Other Topics Concern  . None   Social History Narrative  . None   Outpatient Encounter Prescriptions as of 10/28/2016  Medication Sig  . amLODipine (NORVASC) 10 MG tablet Take 20 mg by mouth daily.   Marland Kitchen aspirin EC 81 MG tablet Take 81 mg by mouth once.   Marland Kitchen atorvastatin (LIPITOR) 10 MG tablet TAKE (1) TABLET BY MOUTH AT BEDTIME.  . Calcium Citrate-Vitamin D (CALCIUM + D PO) Take by mouth 2 (two) times daily.  . carvedilol (COREG) 25 MG tablet Take 12.5 mg by mouth 2 (two) times daily with a meal.   . cetirizine (ZYRTEC) 10 MG tablet Take 10 mg by mouth daily as needed for allergies.   . Cholecalciferol (VITAMIN D) 2000 units CAPS Take by mouth.  . Cyanocobalamin 500 MCG/0.1ML SOLN Place into the nose.  . docusate sodium (COLACE) 100 MG capsule Take 100 mg by mouth 2 (two) times daily.  Marland Kitchen eplerenone (INSPRA) 50 MG tablet Take 150 mg by mouth 2 (two) times daily.   Marland Kitchen lisinopril (PRINIVIL,ZESTRIL) 40 MG tablet Take 40 mg by mouth every morning.  . metFORMIN (GLUCOPHAGE) 1000 MG tablet TAKE 1 TABLET BY MOUTH TWICE DAILY WITH A MEAL.  . Multiple Vitamins-Minerals (CENTRUM PO) Take 1 tablet by mouth every morning.  . sertraline  (ZOLOFT) 50 MG tablet Take 1 tablet (50 mg total) by mouth daily.  . vitamin B-12 (CYANOCOBALAMIN) 1000 MCG tablet Take 1,000 mcg by mouth every morning.   No facility-administered encounter medications on file as of 10/28/2016.    ALLERGIES: Allergies  Allergen Reactions  . Prednisone     Starts itching   VACCINATION STATUS:  There is no immunization history on file for this patient.  Diabetes  He presents for his follow-up diabetic visit. He has type 2 diabetes mellitus. Onset time: He was diagnosed at approximate age of 49 years. His disease course has been stable. There are no hypoglycemic associated symptoms. Pertinent negatives for hypoglycemia include no confusion, headaches, pallor or seizures. Pertinent negatives for diabetes include no chest pain, no fatigue, no polydipsia, no polyphagia, no polyuria and no weakness. There are no hypoglycemic complications. Symptoms are stable. Diabetic complications include a CVA. Risk factors for coronary artery disease include diabetes mellitus, dyslipidemia, hypertension, male sex, obesity and sedentary lifestyle. Current diabetic treatment includes insulin injections and oral agent (dual therapy). He is compliant with treatment most of the time. His weight is stable. He participates in exercise intermittently. An ACE inhibitor/angiotensin II receptor blocker is being  taken. Eye exam is current.  Hyperlipidemia  This is a chronic problem. The current episode started more than 1 year ago. Pertinent negatives include no chest pain, myalgias or shortness of breath. Current antihyperlipidemic treatment includes statins.  Hypertension  This is a chronic problem. The current episode started more than 1 year ago. The problem is uncontrolled. Pertinent negatives include no chest pain, headaches, neck pain, palpitations or shortness of breath. Risk factors for coronary artery disease include diabetes mellitus, dyslipidemia, male gender and obesity.  Hypertensive end-organ damage includes CVA.    Review of Systems  Constitutional: Negative for fatigue and unexpected weight change.       He lost total of 120 pounds. He has steady weight since last visit.  HENT: Negative for dental problem, mouth sores and trouble swallowing.   Eyes: Negative for visual disturbance.  Respiratory: Negative for cough, choking, chest tightness, shortness of breath and wheezing.   Cardiovascular: Negative for chest pain, palpitations and leg swelling.  Gastrointestinal: Negative for abdominal distention, abdominal pain, constipation, diarrhea, nausea and vomiting.  Endocrine: Negative for polydipsia, polyphagia and polyuria.  Genitourinary: Negative for dysuria, flank pain, hematuria and urgency.  Musculoskeletal: Negative for back pain, gait problem, myalgias and neck pain.  Skin: Negative for pallor, rash and wound.  Neurological: Negative for seizures, syncope, weakness, numbness and headaches.  Psychiatric/Behavioral: Negative.  Negative for confusion and dysphoric mood.    Objective:    BP 131/86   Pulse 60   Ht 5' 10"  (1.778 m)   Wt 230 lb (104.3 kg)   BMI 33.00 kg/m   Wt Readings from Last 3 Encounters:  10/28/16 230 lb (104.3 kg)  07/11/16 231 lb (104.8 kg)  01/11/16 227 lb (103 kg)    Physical Exam  Constitutional: He is oriented to person, place, and time. He appears well-developed and well-nourished. He is cooperative. No distress.  HENT:  Head: Normocephalic and atraumatic.  Eyes: EOM are normal.  Neck: Normal range of motion. Neck supple. No tracheal deviation present. No thyromegaly present.  Cardiovascular: Normal rate, S1 normal, S2 normal and normal heart sounds.  Exam reveals no gallop.   No murmur heard. Pulses:      Dorsalis pedis pulses are 1+ on the right side, and 1+ on the left side.       Posterior tibial pulses are 1+ on the right side, and 1+ on the left side.  Pulmonary/Chest: Breath sounds normal. No respiratory  distress. He has no wheezes.  Abdominal: Soft. Bowel sounds are normal. He exhibits no distension. There is no tenderness. There is no guarding and no CVA tenderness.  Musculoskeletal: He exhibits no edema.       Right shoulder: He exhibits no swelling and no deformity.  Neurological: He is alert and oriented to person, place, and time. He has normal strength and normal reflexes. No cranial nerve deficit or sensory deficit. Gait normal.  Skin: Skin is warm and dry. No rash noted. No cyanosis. Nails show no clubbing.  Psychiatric: He has a normal mood and affect. His speech is normal and behavior is normal. Judgment and thought content normal. Cognition and memory are normal.    Results for orders placed or performed in visit on 07/11/16  T4, free  Result Value Ref Range   Free T4 1.21 0.82 - 1.77 ng/dL  CMP14+EGFR  Result Value Ref Range   Glucose 143 (H) 65 - 99 mg/dL   BUN 13 8 - 27 mg/dL   Creatinine, Ser  0.63 (L) 0.76 - 1.27 mg/dL   GFR calc non Af Amer 103 >59 mL/min/1.73   GFR calc Af Amer 119 >59 mL/min/1.73   BUN/Creatinine Ratio 21 10 - 24   Sodium 142 134 - 144 mmol/L   Potassium 3.6 3.5 - 5.2 mmol/L   Chloride 99 96 - 106 mmol/L   CO2 29 20 - 29 mmol/L   Calcium 9.2 8.6 - 10.2 mg/dL   Total Protein 6.8 6.0 - 8.5 g/dL   Albumin 4.2 3.6 - 4.8 g/dL   Globulin, Total 2.6 1.5 - 4.5 g/dL   Albumin/Globulin Ratio 1.6 1.2 - 2.2   Bilirubin Total 1.0 0.0 - 1.2 mg/dL   Alkaline Phosphatase 111 39 - 117 IU/L   AST 24 0 - 40 IU/L   ALT 24 0 - 44 IU/L  TSH  Result Value Ref Range   TSH 2.120 0.450 - 4.500 uIU/mL   Diabetic Labs (most recent): Lab Results  Component Value Date   HGBA1C 7.3 (H) 07/09/2016   HGBA1C 6.5 (H) 01/04/2016   HGBA1C 7.8 (H) 09/29/2015     Assessment & Plan:   1. Type 2 diabetes mellitus with vascular disease (HCC)  His diabetes is  complicated by CVA and patient remains at a high risk for more acute and chronic complications of diabetes which  include CAD, CVA, CKD, retinopathy, and neuropathy. These are all discussed in detail with the patient. - His current labs did not include A1c, last visit A1c was 7.3%.  Glucose logs and insulin administration records pertaining to this visit,  to be scanned into patient's records.  Recent labs reviewed.   - I have re-counseled the patient on diet management and weight loss  by adopting a carbohydrate restricted / protein rich  Diet.  Suggestion is made for him to avoid simple carbohydrates  from his diet including Cakes, Sweet Desserts, Ice Cream, Soda (diet and regular), Sweet Tea, Candies, Chips, Cookies, Store Bought Juices, Alcohol in Excess of  1-2 drinks a day, Artificial Sweeteners, and "Sugar-free" Products. This will help patient to have stable blood glucose profile and potentially avoid unintended weight gain.   - Patient is advised to stick to a routine mealtimes to eat 3 meals  a day and avoid unnecessary snacks ( to snack only to correct hypoglycemia).  - The patient  has been  scheduled with Jearld Fenton, RDN, CDE for individualized DM education, and he is status post Roux-en-Y bariatric surgery.  - I have approached patient with the following individualized plan to manage diabetes and patient agrees. - After a successful Roux-en-Y bariatric surgery and lost 120 pounds, he was able to come off of multiple daily injections of insulin therapy. - I advised him to continue metformin  1000 mg by mouth twice a day.  - Patient will be considered for incretin therapy if A1c increases  to higher than 7.5% during his next visit.  - Patient specific target  for A1c; LDL, HDL, Triglycerides, and  Waist Circumference were discussed in detail.  2) BP/HTN: controlled. Continue current medications including lisinopril 40 minute grams by mouth daily, amlodipine 10 mg by mouth daily, Coreg 12.5 mg by mouth twice a day. I advised him to continue follow-up in hypertension clinic. 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  3) Lipids/HPL: Controlled, I advised him to lower Lipitor to 10 mg by mouth daily at bedtime. She'll have repeat fasting lipid panel before his next  visit.  4)  Weight/Diet: He is status post Roux-en-Y bariatric surgery,  he lost a total of 120 pounds overall.   CDE consult in progress, exercise, and carbohydrates information provided.  5) Chronic Care/Health Maintenance:  -Patient is on ACEI/ARB and Statin medications and encouraged to continue to follow up with Ophthalmology, Podiatrist at least yearly or according to recommendations, and advised to  stay away from smoking. I have recommended yearly flu vaccine and pneumonia vaccination at least every 5 years; moderate intensity exercise for up to 150 minutes weekly; and  sleep for at least 7 hours a day.     6) Vitamin D deficiency - She is status post therapy with vitamin D supplements, his vitamin B12 is normal at 939.  - I advised patient to maintain close follow up with their PCP and he is bariatric team for ongoing care needs.  - Time spent with the patient: 25 min, of which >50% was spent in reviewing his sugar logs , discussing his hypo- and hyper-glycemic episodes, reviewing  previous labs and insulin doses and developing a plan to avoid hypo- and hyper-glycemia.   - Patient is asked to bring meter and  blood glucose logs during his next visit.   Follow up plan: -Return in about 4 months (around 02/27/2017) for follow up with pre-visit labs.  Glade Lloyd, MD Phone: [phone]  Fax: [phone]   10/28/2016, 1:02 PM

## 2016-10-28 NOTE — Patient Instructions (Signed)

## 2016-12-03 ENCOUNTER — Other Ambulatory Visit: Payer: Self-pay | Admitting: "Endocrinology

## 2017-03-10 ENCOUNTER — Other Ambulatory Visit: Payer: Self-pay | Admitting: "Endocrinology

## 2017-03-25 ENCOUNTER — Other Ambulatory Visit: Payer: Self-pay | Admitting: "Endocrinology

## 2017-03-26 LAB — RENAL FUNCTION PANEL
Albumin: 4.6 g/dL (ref 3.6–4.8)
BUN / CREAT RATIO: 28 — AB (ref 10–24)
BUN: 21 mg/dL (ref 8–27)
CO2: 28 mmol/L (ref 20–29)
Calcium: 9.4 mg/dL (ref 8.6–10.2)
Chloride: 98 mmol/L (ref 96–106)
Creatinine, Ser: 0.74 mg/dL — ABNORMAL LOW (ref 0.76–1.27)
GFR calc Af Amer: 110 mL/min/{1.73_m2} (ref >59)
GFR calc non Af Amer: 95 mL/min/{1.73_m2} (ref >59)
Glucose: 135 mg/dL — ABNORMAL HIGH (ref 65–99)
Phosphorus: 3.7 mg/dL (ref 2.5–4.5)
Potassium: 4.4 mmol/L (ref 3.5–5.2)
Sodium: 142 mmol/L (ref 134–144)

## 2017-03-26 LAB — LIPID PANEL W/O CHOL/HDL RATIO
Cholesterol, Total: 119 mg/dL (ref 100–199)
HDL: 43 mg/dL (ref >39)
LDL CALC: 60 mg/dL (ref 0–99)
Triglycerides: 80 mg/dL (ref 0–149)
VLDL CHOLESTEROL CAL: 16 mg/dL (ref 5–40)

## 2017-03-26 LAB — HGB A1C W/O EAG: Hgb A1c MFr Bld: 6.6 % — ABNORMAL HIGH (ref 4.8–5.6)

## 2017-03-26 LAB — SPECIMEN STATUS REPORT

## 2017-04-01 ENCOUNTER — Ambulatory Visit (INDEPENDENT_AMBULATORY_CARE_PROVIDER_SITE_OTHER): Payer: Medicare Other | Admitting: "Endocrinology

## 2017-04-01 ENCOUNTER — Encounter: Payer: Self-pay | Admitting: "Endocrinology

## 2017-04-01 VITALS — BP 142/82 | HR 75 | Ht 70.0 in | Wt 231.0 lb

## 2017-04-01 DIAGNOSIS — E782 Mixed hyperlipidemia: Secondary | ICD-10-CM

## 2017-04-01 DIAGNOSIS — Z6832 Body mass index (BMI) 32.0-32.9, adult: Secondary | ICD-10-CM

## 2017-04-01 DIAGNOSIS — E1159 Type 2 diabetes mellitus with other circulatory complications: Secondary | ICD-10-CM | POA: Diagnosis not present

## 2017-04-01 DIAGNOSIS — I10 Essential (primary) hypertension: Secondary | ICD-10-CM | POA: Diagnosis not present

## 2017-04-01 DIAGNOSIS — E6609 Other obesity due to excess calories: Secondary | ICD-10-CM | POA: Diagnosis not present

## 2017-04-01 MED ORDER — METFORMIN HCL 1000 MG PO TABS: ORAL_TABLET | ORAL | 1 refills | Status: DC

## 2017-04-01 NOTE — Progress Notes (Signed)
Subjective:    Patient ID: Zachary Murillo., male    DOB: 1950/11/23, PCP Sinda Du, MD   Past Medical History:  Diagnosis Date  . Diabetes mellitus ([place])   . History of Roux-en-Y gastric bypass   . Hypertension   . Overweight(278.02)   . Sleep apnea   . Stroke [facility])    Past Surgical History:  Procedure Laterality Date  . BACK SURGERY    . CARDIAC CATHETERIZATION    . KNEE ARTHROSCOPY    . NASAL SEPTUM SURGERY     Social History   Socioeconomic History  . Marital status: Married    Spouse name: None  . Number of children: None  . Years of education: Masters  . Highest education level: None  Social Needs  . Financial resource strain: None  . Food insecurity - worry: None  . Food insecurity - inability: None  . Transportation needs - medical: None  . Transportation needs - non-medical: None  Occupational History  . None  Tobacco Use  . Smoking status: Never Smoker  . Smokeless tobacco: Never Used  Substance and Sexual Activity  . Alcohol use: Yes    Comment: occ  . Drug use: No  . Sexual activity: Not Currently  Other Topics Concern  . None  Social History Narrative  . None   Outpatient Encounter Medications as of 04/01/2017  Medication Sig  . chlorthalidone (HYGROTON) 25 MG tablet Take 12.5 mg by mouth daily.   Marland Kitchen amLODipine (NORVASC) 10 MG tablet Take 20 mg by mouth daily.   Marland Kitchen aspirin EC 81 MG tablet Take 81 mg by mouth once.   Marland Kitchen atorvastatin (LIPITOR) 10 MG tablet TAKE (1) TABLET BY MOUTH AT BEDTIME.  . Calcium Citrate-Vitamin D (CALCIUM + D PO) Take by mouth 2 (two) times daily.  . carvedilol (COREG) 25 MG tablet Take 12.5 mg by mouth 2 (two) times daily with a meal.   . cetirizine (ZYRTEC) 10 MG tablet Take 10 mg by mouth daily as needed for allergies.   . Cholecalciferol (VITAMIN D) 2000 units CAPS Take by mouth.  . Cyanocobalamin 500 MCG/0.1ML SOLN Place into the nose.  . docusate sodium (COLACE) 100 MG capsule Take 100 mg by mouth 2 (two)  times daily.  Marland Kitchen eplerenone (INSPRA) 50 MG tablet Take 150 mg by mouth 2 (two) times daily.   Marland Kitchen lisinopril (PRINIVIL,ZESTRIL) 40 MG tablet Take 40 mg by mouth every morning.  . metFORMIN (GLUCOPHAGE) 1000 MG tablet TAKE 1 TABLET BY MOUTH TWICE DAILY WITH A MEAL.  . Multiple Vitamins-Minerals (CENTRUM PO) Take 1 tablet by mouth every morning.  . sertraline (ZOLOFT) 50 MG tablet Take 1 tablet (50 mg total) by mouth daily.  . vitamin B-12 (CYANOCOBALAMIN) 1000 MCG tablet Take 1,000 mcg by mouth every morning.  . [DISCONTINUED] metFORMIN (GLUCOPHAGE) 1000 MG tablet TAKE 1 TABLET BY MOUTH TWICE DAILY WITH A MEAL.   No facility-administered encounter medications on file as of 04/01/2017.    ALLERGIES: Allergies  Allergen Reactions  . Prednisone     Starts itching   VACCINATION STATUS:  There is no immunization history on file for this patient.  Diabetes  He presents for his follow-up diabetic visit. He has type 2 diabetes mellitus. Onset time: He was diagnosed at approximate age of 74 years. His disease course has been improving. There are no hypoglycemic associated symptoms. Pertinent negatives for hypoglycemia include no confusion, headaches, pallor or seizures. Pertinent negatives for diabetes include no  chest pain, no fatigue, no polydipsia, no polyphagia, no polyuria and no weakness. There are no hypoglycemic complications. Symptoms are improving. Diabetic complications include a CVA. Risk factors for coronary artery disease include diabetes mellitus, dyslipidemia, hypertension, male sex, obesity and sedentary lifestyle. Current diabetic treatment includes oral agent (dual therapy) and oral agent (monotherapy). He is compliant with treatment all of the time. His weight is stable (His weight is stabilizing after he lost 120 pounds status post gastric bypass). He is following a diabetic diet. He has had a previous visit with a dietitian. He participates in exercise intermittently. An ACE  inhibitor/angiotensin II receptor blocker is being taken. Eye exam is current.  Hyperlipidemia  This is a chronic problem. The current episode started more than 1 year ago. Pertinent negatives include no chest pain, myalgias or shortness of breath. Current antihyperlipidemic treatment includes statins. Risk factors for coronary artery disease include diabetes mellitus, dyslipidemia, hypertension, male sex, a sedentary lifestyle, obesity and family history.  Hypertension  This is a chronic problem. The current episode started more than 1 year ago. The problem is uncontrolled. Pertinent negatives include no chest pain, headaches, neck pain, palpitations or shortness of breath. Risk factors for coronary artery disease include diabetes mellitus, dyslipidemia, male gender and obesity. Hypertensive end-organ damage includes CVA. Identifiable causes of hypertension include hyperaldosteronism.    Review of Systems  Constitutional: Negative for fatigue and unexpected weight change.       He lost total of 120 pounds. He has steady weight since last visit.  HENT: Negative for dental problem, mouth sores and trouble swallowing.   Eyes: Negative for visual disturbance.  Respiratory: Negative for cough, choking, chest tightness, shortness of breath and wheezing.   Cardiovascular: Negative for chest pain, palpitations and leg swelling.  Gastrointestinal: Negative for abdominal distention, abdominal pain, constipation, diarrhea, nausea and vomiting.  Endocrine: Negative for polydipsia, polyphagia and polyuria.  Genitourinary: Negative for dysuria, flank pain, hematuria and urgency.  Musculoskeletal: Negative for back pain, gait problem, myalgias and neck pain.  Skin: Negative for pallor, rash and wound.  Neurological: Negative for seizures, syncope, weakness, numbness and headaches.  Psychiatric/Behavioral: Negative.  Negative for confusion and dysphoric mood.    Objective:    BP (!) 142/82   Pulse 75    Ht 5\' 10"  (1.778 m)   Wt 231 lb (104.8 kg)   BMI 33.15 kg/m   Wt Readings from Last 3 Encounters:  04/01/17 231 lb (104.8 kg)  10/28/16 230 lb (104.3 kg)  07/11/16 231 lb (104.8 kg)    Physical Exam  Constitutional: He is oriented to person, place, and time. He appears well-developed and well-nourished. He is cooperative. No distress.  HENT:  Head: Normocephalic and atraumatic.  Eyes: EOM are normal.  Neck: Normal range of motion. Neck supple. No tracheal deviation present. No thyromegaly present.  Cardiovascular: Normal rate, S1 normal, S2 normal and normal heart sounds. Exam reveals no gallop.  No murmur heard. Pulses:      Dorsalis pedis pulses are 1+ on the right side, and 1+ on the left side.       Posterior tibial pulses are 1+ on the right side, and 1+ on the left side.  Pulmonary/Chest: Breath sounds normal. No respiratory distress. He has no wheezes.  Abdominal: Soft. Bowel sounds are normal. He exhibits no distension. There is no tenderness. There is no guarding and no CVA tenderness.  Musculoskeletal: He exhibits no edema.       Right shoulder: He exhibits  no swelling and no deformity.  Neurological: He is alert and oriented to person, place, and time. He has normal strength and normal reflexes. No cranial nerve deficit or sensory deficit. Gait normal.  Skin: Skin is warm and dry. No rash noted. No cyanosis. Nails show no clubbing.  Psychiatric: He has a normal mood and affect. His speech is normal and behavior is normal. Judgment and thought content normal. Cognition and memory are normal.    Results for orders placed or performed in visit on 03/25/17  Renal function panel  Result Value Ref Range   Glucose 135 (H) 65 - 99 mg/dL   BUN 21 8 - 27 mg/dL   Creatinine, Ser 0.74 (L) 0.76 - 1.27 mg/dL   GFR calc non Af Amer 95 >59 mL/min/1.73   GFR calc Af Amer 110 >59 mL/min/1.73   BUN/Creatinine Ratio 28 (H) 10 - 24   Sodium 142 134 - 144 mmol/L   Potassium 4.4 3.5 - 5.2  mmol/L   Chloride 98 96 - 106 mmol/L   CO2 28 20 - 29 mmol/L   Calcium 9.4 8.6 - 10.2 mg/dL   Phosphorus 3.7 2.5 - 4.5 mg/dL   Albumin 4.6 3.6 - 4.8 g/dL  Lipid Panel w/o Chol/HDL Ratio  Result Value Ref Range   Cholesterol, Total 119 100 - 199 mg/dL   Triglycerides 80 0 - 149 mg/dL   HDL 43 >39 mg/dL   VLDL Cholesterol Cal 16 5 - 40 mg/dL   LDL Calculated 60 0 - 99 mg/dL  Hgb A1c w/o eAG  Result Value Ref Range   Hgb A1c MFr Bld 6.6 (H) 4.8 - 5.6 %  Specimen status report  Result Value Ref Range   specimen status report Comment    Diabetic Labs (most recent): Lab Results  Component Value Date   HGBA1C 6.6 (H) 03/25/2017   HGBA1C 7.3 (H) 07/09/2016   HGBA1C 6.5 (H) 01/04/2016     Assessment & Plan:   1. Type 2 diabetes mellitus with vascular disease (HCC)  His diabetes is  complicated by CVA and patient remains at a high risk for more acute and chronic complications of diabetes which include CAD, CVA, CKD, retinopathy, and neuropathy. These are all discussed in detail with the patient. - His current labs showed A1c of 6.6%, improving from 7.3% during last visit.  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  Recent labs reviewed.  - I have re-counseled the patient on diet management and weight loss  by adopting a carbohydrate restricted / protein rich  Diet.  -  Suggestion is made for him to avoid simple carbohydrates  from his diet including Cakes, Sweet Desserts / Pastries, Ice Cream, Soda (diet and regular), Sweet Tea, Candies, Chips, Cookies, Store Bought Juices, Alcohol in Excess of  1-2 drinks a day, Artificial Sweeteners, and "Sugar-free" Products. This will help patient to have stable blood glucose profile and potentially avoid unintended weight gain.   - Patient is advised to stick to a routine mealtimes to eat 3 meals  a day and avoid unnecessary snacks ( to snack only to correct hypoglycemia).  - The patient   has been  scheduled with Jearld Fenton, RDN, CDE for individualized DM education, and he is status post Roux-en-Y bariatric surgery.  - I have approached patient with the following individualized plan to manage diabetes and patient agrees. - After a successful Roux-en-Y bariatric surgery and lost 120 pounds, he was able to come  off of multiple daily injections of insulin therapy. - I advised him to continue metformin  1000 mg by mouth twice a day.  - Patient will be considered for incretin therapy if A1c increases  to higher than 7.5% during his next visit.  - Patient specific target  for A1c; LDL, HDL, Triglycerides, and  Waist Circumference were discussed in detail.  2) BP/HTN: uncontrolled.  He is status post laparoscopic right adrenalectomy after bilateral adrenal venous sampling lateralized to right adrenal gland at [facility] . History on multiple blood pressure medications to control blood pressure. I advised him to continue his current blood pressure medications  including lisinopril 40 mg by mouth daily, amlodipine 10 mg by mouth daily,  lisinopril 40 mg by mouth daily,  eplrenone 150 mg by mouth twice a day,  chlorthalidone 12.5 mmol by mouth daily, Coreg 12.5 mg by mouth twice a day. I advised him to continue follow-up in hypertension clinic.   3) Lipids/HPL: Controlled, I advised him to lower Lipitor to 10 mg by mouth daily at bedtime. She'll have repeat fasting lipid panel before his next visit.  4)  Weight/Diet: He is status post Roux-en-Y bariatric surgery,  he lost a total of 120 pounds overall.   CDE consult in progress, exercise, and carbohydrates information provided.  5) Chronic Care/Health Maintenance:  -Patient is on ACEI/ARB and Statin medications and encouraged to continue to follow up with Ophthalmology, Podiatrist at least yearly or according to recommendations, and advised to  stay away from smoking. I have recommended yearly flu vaccine and pneumonia  vaccination at least every 5 years; moderate intensity exercise for up to 150 minutes weekly; and  sleep for at least 7 hours a day.   6) Vitamin D deficiency - She is status post therapy with vitamin D supplements, his vitamin B12 is normal at 939.  - I advised patient to maintain close follow up with his PCP and he is bariatric team for ongoing care needs.  - Time spent with the patient: 25 min, of which >50% was spent in reviewing her  current and  previous labs, previous treatments, and medications  doses and developing a plan for long-term care.   Follow up plan: -Return in about 6 months (around 09/29/2017) for follow up with pre-visit labs.  Glade Lloyd, MD Phone: [phone]  Fax: [phone]  -  This note was partially dictated with voice recognition software. Similar sounding words can be transcribed inadequately or may not  be corrected upon review.  04/01/2017, 10:36 AM

## 2017-04-01 NOTE — Patient Instructions (Signed)

## 2017-06-10 ENCOUNTER — Other Ambulatory Visit: Payer: Self-pay | Admitting: "Endocrinology

## 2017-09-20 ENCOUNTER — Other Ambulatory Visit: Payer: Self-pay | Admitting: "Endocrinology

## 2017-10-03 ENCOUNTER — Other Ambulatory Visit: Payer: Self-pay | Admitting: "Endocrinology

## 2017-10-04 LAB — COMPREHENSIVE METABOLIC PANEL
ALBUMIN: 4.5 g/dL (ref 3.6–4.8)
ALT: 20 IU/L (ref 0–44)
AST: 21 IU/L (ref 0–40)
Albumin/Globulin Ratio: 1.7 (ref 1.2–2.2)
Alkaline Phosphatase: 99 IU/L (ref 39–117)
BUN / CREAT RATIO: 24 (ref 10–24)
BUN: 20 mg/dL (ref 8–27)
Bilirubin Total: 1 mg/dL (ref 0.0–1.2)
CO2: 30 mmol/L — AB (ref 20–29)
CREATININE: 0.85 mg/dL (ref 0.76–1.27)
Calcium: 10 mg/dL (ref 8.6–10.2)
Chloride: 98 mmol/L (ref 96–106)
GFR calc Af Amer: 104 mL/min/{1.73_m2} (ref >59)
GFR calc non Af Amer: 90 mL/min/{1.73_m2} (ref >59)
Globulin, Total: 2.7 g/dL (ref 1.5–4.5)
Glucose: 140 mg/dL — ABNORMAL HIGH (ref 65–99)
Potassium: 4.6 mmol/L (ref 3.5–5.2)
Sodium: 142 mmol/L (ref 134–144)
Total Protein: 7.2 g/dL (ref 6.0–8.5)

## 2017-10-04 LAB — HGB A1C W/O EAG: Hgb A1c MFr Bld: 7.3 % — ABNORMAL HIGH (ref 4.8–5.6)

## 2017-10-04 LAB — SPECIMEN STATUS REPORT

## 2017-10-16 ENCOUNTER — Ambulatory Visit: Payer: Medicare Other | Admitting: "Endocrinology

## 2017-10-18 ENCOUNTER — Other Ambulatory Visit: Payer: Self-pay | Admitting: "Endocrinology

## 2017-10-28 ENCOUNTER — Encounter: Payer: Self-pay | Admitting: "Endocrinology

## 2017-10-28 ENCOUNTER — Ambulatory Visit: Payer: Medicare Other | Admitting: "Endocrinology

## 2017-10-28 VITALS — BP 111/77 | HR 79 | Ht 70.0 in | Wt 228.0 lb

## 2017-10-28 DIAGNOSIS — Z6832 Body mass index (BMI) 32.0-32.9, adult: Secondary | ICD-10-CM

## 2017-10-28 DIAGNOSIS — E782 Mixed hyperlipidemia: Secondary | ICD-10-CM | POA: Diagnosis not present

## 2017-10-28 DIAGNOSIS — E6609 Other obesity due to excess calories: Secondary | ICD-10-CM | POA: Diagnosis not present

## 2017-10-28 DIAGNOSIS — E1159 Type 2 diabetes mellitus with other circulatory complications: Secondary | ICD-10-CM

## 2017-10-28 DIAGNOSIS — I10 Essential (primary) hypertension: Secondary | ICD-10-CM | POA: Diagnosis not present

## 2017-10-28 NOTE — Progress Notes (Signed)
Endocrinology follow-up note   Subjective:    Patient ID: Zachary Shadow., male    DOB: [birth year], PCP Sinda Du, MD   Past Medical History:  Diagnosis Date  . Diabetes mellitus ([place])   . History of Roux-en-Y gastric bypass   . Hypertension   . Overweight(278.02)   . Sleep apnea   . Stroke [facility])    Past Surgical History:  Procedure Laterality Date  . BACK SURGERY    . CARDIAC CATHETERIZATION    . KNEE ARTHROSCOPY    . NASAL SEPTUM SURGERY     Social History   Socioeconomic History  . Marital status: Married    Spouse name: Not on file  . Number of children: Not on file  . Years of education: Masters  . Highest education level: Not on file  Occupational History  . Not on file  Social Needs  . Financial resource strain: Not on file  . Food insecurity:    Worry: Not on file    Inability: Not on file  . Transportation needs:    Medical: Not on file    Non-medical: Not on file  Tobacco Use  . Smoking status: Never Smoker  . Smokeless tobacco: Never Used  Substance and Sexual Activity  . Alcohol use: Yes    Comment: occ  . Drug use: No  . Sexual activity: Not Currently  Lifestyle  . Physical activity:    Days per week: Not on file    Minutes per session: Not on file  . Stress: Not on file  Relationships  . Social connections:    Talks on phone: Not on file    Gets together: Not on file    Attends religious service: Not on file    Active member of club or organization: Not on file    Attends meetings of clubs or organizations: Not on file    Relationship status: Not on file  Other Topics Concern  . Not on file  Social History Narrative  . Not on file   Outpatient Encounter Medications as of 10/28/2017  Medication Sig  . amLODipine (NORVASC) 10 MG tablet Take 10 mg by mouth daily.  Marland Kitchen aspirin EC 81 MG tablet Take 81 mg by mouth once.   Marland Kitchen atorvastatin (LIPITOR) 10 MG tablet TAKE (1) TABLET BY MOUTH AT BEDTIME.  . Calcium Citrate-Vitamin D  (CALCIUM + D PO) Take by mouth 2 (two) times daily.  . carvedilol (COREG) 25 MG tablet Take 12.5 mg by mouth 2 (two) times daily with a meal.   . cetirizine (ZYRTEC) 10 MG tablet Take 10 mg by mouth daily as needed for allergies.   . chlorthalidone (HYGROTON) 25 MG tablet Take 12.5 mg by mouth daily.   . Cholecalciferol (VITAMIN D) 2000 units CAPS Take by mouth.  . Cyanocobalamin 500 MCG/0.1ML SOLN Place into the nose.  . docusate sodium (COLACE) 100 MG capsule Take 100 mg by mouth 2 (two) times daily.  Marland Kitchen eplerenone (INSPRA) 50 MG tablet Take 150 mg by mouth 2 (two) times daily.   Marland Kitchen lisinopril (PRINIVIL,ZESTRIL) 40 MG tablet Take 40 mg by mouth every morning.  . metFORMIN (GLUCOPHAGE) 1000 MG tablet TAKE 1 TABLET BY MOUTH TWICE DAILY WITH A MEAL.  . Multiple Vitamins-Minerals (CENTRUM PO) Take 1 tablet by mouth every morning.  . sertraline (ZOLOFT) 50 MG tablet Take 1 tablet (50 mg total) by mouth daily.  . vitamin B-12 (CYANOCOBALAMIN) 1000 MCG tablet Take 1,000 mcg by mouth  every morning.   No facility-administered encounter medications on file as of 10/28/2017.    ALLERGIES: Allergies  Allergen Reactions  . Prednisone     Starts itching   VACCINATION STATUS:  There is no immunization history on file for this patient.  Diabetes  He presents for his follow-up diabetic visit. He has type 2 diabetes mellitus. Onset time: He was diagnosed at approximate age of 57 years. His disease course has been improving. There are no hypoglycemic associated symptoms. Pertinent negatives for hypoglycemia include no confusion, headaches, pallor or seizures. Pertinent negatives for diabetes include no chest pain, no fatigue, no polydipsia, no polyphagia, no polyuria and no weakness. There are no hypoglycemic complications. Symptoms are improving. Diabetic complications include a CVA. Risk factors for coronary artery disease include diabetes mellitus, dyslipidemia, hypertension, male sex, obesity and  sedentary lifestyle. Current diabetic treatment includes oral agent (dual therapy) and oral agent (monotherapy). He is compliant with treatment all of the time. His weight is decreasing steadily (His weight is stabilizing after he lost 124 pounds status post gastric bypass). He is following a diabetic diet. He has had a previous visit with a dietitian. He participates in exercise intermittently. An ACE inhibitor/angiotensin II receptor blocker is being taken. Eye exam is current.  Hyperlipidemia  This is a chronic problem. The current episode started more than 1 year ago. Pertinent negatives include no chest pain, myalgias or shortness of breath. Current antihyperlipidemic treatment includes statins. Risk factors for coronary artery disease include diabetes mellitus, dyslipidemia, hypertension, male sex, a sedentary lifestyle, obesity and family history.  Hypertension  This is a chronic problem. The current episode started more than 1 year ago. The problem is uncontrolled. Pertinent negatives include no chest pain, headaches, neck pain, palpitations or shortness of breath. Risk factors for coronary artery disease include diabetes mellitus, dyslipidemia, male gender and obesity. Hypertensive end-organ damage includes CVA. Identifiable causes of hypertension include hyperaldosteronism.    Review of Systems  Constitutional: Negative for fatigue and unexpected weight change.       He lost total of 124 pounds.   HENT: Negative for dental problem, mouth sores and trouble swallowing.   Eyes: Negative for visual disturbance.  Respiratory: Negative for cough, choking, chest tightness, shortness of breath and wheezing.   Cardiovascular: Negative for chest pain, palpitations and leg swelling.  Gastrointestinal: Negative for abdominal distention, abdominal pain, constipation, diarrhea, nausea and vomiting.  Endocrine: Negative for polydipsia, polyphagia and polyuria.  Genitourinary: Negative for dysuria, flank  pain, hematuria and urgency.  Musculoskeletal: Negative for back pain, gait problem, myalgias and neck pain.  Skin: Negative for pallor, rash and wound.  Neurological: Negative for seizures, syncope, weakness, numbness and headaches.  Psychiatric/Behavioral: Negative.  Negative for confusion and dysphoric mood.    Objective:    BP 111/77   Pulse 79   Ht 5\' 10"  (1.778 m)   Wt 228 lb (103.4 kg)   BMI 32.71 kg/m   Wt Readings from Last 3 Encounters:  10/28/17 228 lb (103.4 kg)  04/01/17 231 lb (104.8 kg)  10/28/16 230 lb (104.3 kg)    Physical Exam  Constitutional: He is oriented to person, place, and time. He appears well-developed and well-nourished. He is cooperative. No distress.  HENT:  Head: Normocephalic and atraumatic.  Eyes: EOM are normal.  Neck: Normal range of motion. Neck supple. No tracheal deviation present. No thyromegaly present.  Cardiovascular: Normal rate, S1 normal, S2 normal and normal heart sounds. Exam reveals no gallop.  No  murmur heard. Pulses:      Dorsalis pedis pulses are 1+ on the right side, and 1+ on the left side.       Posterior tibial pulses are 1+ on the right side, and 1+ on the left side.  Pulmonary/Chest: Breath sounds normal. No respiratory distress. He has no wheezes.  Abdominal: Soft. Bowel sounds are normal. He exhibits no distension. There is no tenderness. There is no guarding and no CVA tenderness.  Musculoskeletal: He exhibits no edema.       Right shoulder: He exhibits no swelling and no deformity.  Neurological: He is alert and oriented to person, place, and time. He has normal strength and normal reflexes. No cranial nerve deficit or sensory deficit. Gait normal.  Skin: Skin is warm and dry. No rash noted. No cyanosis. Nails show no clubbing.  Psychiatric: He has a normal mood and affect. His speech is normal and behavior is normal. Judgment and thought content normal. Cognition and memory are normal.    Results for orders placed  or performed in visit on 10/03/17  Comprehensive metabolic panel  Result Value Ref Range   Glucose 140 (H) 65 - 99 mg/dL   BUN 20 8 - 27 mg/dL   Creatinine, Ser 0.85 0.76 - 1.27 mg/dL   GFR calc non Af Amer 90 >59 mL/min/1.73   GFR calc Af Amer 104 >59 mL/min/1.73   BUN/Creatinine Ratio 24 10 - 24   Sodium 142 134 - 144 mmol/L   Potassium 4.6 3.5 - 5.2 mmol/L   Chloride 98 96 - 106 mmol/L   CO2 30 (H) 20 - 29 mmol/L   Calcium 10.0 8.6 - 10.2 mg/dL   Total Protein 7.2 6.0 - 8.5 g/dL   Albumin 4.5 3.6 - 4.8 g/dL   Globulin, Total 2.7 1.5 - 4.5 g/dL   Albumin/Globulin Ratio 1.7 1.2 - 2.2   Bilirubin Total 1.0 0.0 - 1.2 mg/dL   Alkaline Phosphatase 99 39 - 117 IU/L   AST 21 0 - 40 IU/L   ALT 20 0 - 44 IU/L  Hgb A1c w/o eAG  Result Value Ref Range   Hgb A1c MFr Bld 7.3 (H) 4.8 - 5.6 %  Specimen status report  Result Value Ref Range   specimen status report Comment    Diabetic Labs (most recent): Lab Results  Component Value Date   HGBA1C 7.3 (H) 10/03/2017   HGBA1C 6.6 (H) 03/25/2017   HGBA1C 7.3 (H) 07/09/2016     Assessment & Plan:   1. Type 2 diabetes mellitus with vascular disease (HCC)  His diabetes is  complicated by CVA and patient remains at a high risk for more acute and chronic complications of diabetes which include CAD, CVA, CKD, retinopathy, and neuropathy. These are all discussed in detail with the patient. - His current labs showed A1c of 7.3% increasing from 6.6%.   -He is status post Roux-en-Y gastric bypass resulting in loss of 124 pounds weight.  Recent labs reviewed.  - I have re-counseled the patient on diet management and weight loss  by adopting a carbohydrate restricted / protein rich  Diet.  -  Suggestion is made for him to avoid simple carbohydrates  from his diet including Cakes, Sweet Desserts / Pastries, Ice Cream, Soda (diet and regular), Sweet Tea, Candies, Chips, Cookies, Store Bought Juices, Alcohol in Excess of  1-2 drinks a day,  Artificial Sweeteners, and "Sugar-free" Products. This will help patient to have stable blood glucose profile and potentially  avoid unintended weight gain.   - Patient is advised to stick to a routine mealtimes to eat 3 meals  a day and avoid unnecessary snacks ( to snack only to correct hypoglycemia).  - The patient  has been  scheduled with Jearld Fenton, RDN, CDE for individualized DM education, and he is status post Roux-en-Y bariatric surgery.  - I have approached patient with the following individualized plan to manage diabetes and patient agrees. - After a successful Roux-en-Y bariatric surgery and lost 124 pounds, he was able to come off of multiple daily injections of insulin therapy. - I advised him to continue metformin  1000 mg by mouth twice a day.  - Patient will be considered for incretin therapy if A1c increases  to higher than 7.5% during his next visit.  - Patient specific target  for A1c; LDL, HDL, Triglycerides, and  Waist Circumference were discussed in detail.  2) BP/HTN: He has history of uncontrolled hypertension requiring up to 5 medications daily.    He is status post laparoscopic right adrenalectomy after bilateral adrenal venous sampling lateralized to right adrenal gland at [facility] .  His blood pressure is getting tighter lately. I advised him to continue his current blood pressure medications  including lisinopril 40 mg by mouth daily, amlodipine 20 mg by mouth daily,  lisinopril 40 mg by mouth daily,  eplrenone 150 mg by mouth twice a day,  chlorthalidone 12.5 mmol by mouth daily, Coreg 12.5 mg by mouth twice a day. -I advised him to lower amlodipine to 10 mg p.o. daily from 20 mg p.o. daily and I advised him to continue follow-up in hypertension clinic.   3) Lipids/HPL: His recent lipid panel showed controlled LDL,  I advised him to continue Lipitor 10 mg by mouth daily at bedtime. He will have repeat fasting lipid panel before his next visit.  4)   Weight/Diet: He is status post Roux-en-Y bariatric surgery,  he lost a total of 124 pounds overall.   CDE consult in progress, exercise, and carbohydrates information provided.  5) Chronic Care/Health Maintenance:  -Patient is on ACEI/ARB and Statin medications and encouraged to continue to follow up with Ophthalmology, Podiatrist at least yearly or according to recommendations, and advised to  stay away from smoking. I have recommended yearly flu vaccine and pneumonia vaccination at least every 5 years; moderate intensity exercise for up to 150 minutes weekly; and  sleep for at least 7 hours a day.   6) Vitamin D deficiency - She is status post therapy with vitamin D supplements, his vitamin B12 is normal at 939.  - I advised patient to maintain close follow up with his PCP and he is bariatric team for ongoing care needs.  - Time spent with the patient: 25 min, of which >50% was spent in reviewing his  current and  previous labs, previous treatments, and medications doses and developing a plan for long-term care.  Zachary Shadow. participated in the discussions, expressed understanding, and voiced agreement with the above plans.  All questions were answered to his satisfaction. he is encouraged to contact clinic should he have any questions or concerns prior to his return visit.  Follow up plan: -Return in about 6 months (around 04/30/2018) for Meter, and Logs, Follow up with Pre-visit Labs.  Glade Lloyd, MD Phone: [phone]  Fax: [phone]  -  This note was partially dictated with voice recognition software. Similar sounding words can be transcribed inadequately or may not  be corrected upon review.  10/28/2017, 10:21 AM

## 2017-10-28 NOTE — Patient Instructions (Signed)

## 2017-11-27 NOTE — Progress Notes (Signed)
Cardiologist states   Knee pain. He has orthopedic appointment in September to evaluate for knee replacement. He does not need further cardiovascular testing prior to that operation. He should continue his antihypertensive medications.

## 2017-12-01 ENCOUNTER — Ambulatory Visit: Payer: Medicare Other | Admitting: Orthopedic Surgery

## 2017-12-01 ENCOUNTER — Ambulatory Visit (INDEPENDENT_AMBULATORY_CARE_PROVIDER_SITE_OTHER): Payer: Medicare Other

## 2017-12-01 ENCOUNTER — Encounter: Payer: Self-pay | Admitting: Orthopedic Surgery

## 2017-12-01 VITALS — BP 110/67 | HR 87 | Ht 69.0 in | Wt 234.0 lb

## 2017-12-01 DIAGNOSIS — M25561 Pain in right knee: Secondary | ICD-10-CM | POA: Diagnosis not present

## 2017-12-01 DIAGNOSIS — G8929 Other chronic pain: Secondary | ICD-10-CM

## 2017-12-01 NOTE — Progress Notes (Signed)
Routine visit  Chief Complaint  Patient presents with  . Knee Pain    right     67 year old male with diabetes hemoglobin A1c 7.3 status post gastric bypass hypertension sleep apnea heart disease presents for reevaluation of his right knee complaining of mild discomfort lateral joint line instability going down the stairs mild pain medially occasional tightness for over 2 years duration.  Pain quality dull ache.  Current treatment none he would wish to discuss total knee replacement but has no major functional deficits other than some giving way when going down the steps.   Review of Systems  Respiratory: Negative for shortness of breath.   Cardiovascular: Negative for chest pain.  Musculoskeletal: Positive for joint pain.  Neurological:       Struggles with balance     Past Medical History:  Diagnosis Date  . Diabetes mellitus ([place])   . History of Roux-en-Y gastric bypass   . Hypertension   . Overweight(278.02)   . Sleep apnea   . Stroke [facility])     Past Surgical History:  Procedure Laterality Date  . BACK SURGERY    . CARDIAC CATHETERIZATION    . KNEE ARTHROSCOPY    . NASAL SEPTUM SURGERY      Family History  Problem Relation Age of Onset  . Heart disease Unknown   . Arthritis Unknown   . Cancer Unknown   . Asthma Unknown   . Diabetes Unknown    Social History   Tobacco Use  . Smoking status: Never Smoker  . Smokeless tobacco: Never Used  Substance Use Topics  . Alcohol use: Yes    Comment: occ  . Drug use: No    Allergies  Allergen Reactions  . Prednisone     Starts itching    Current Meds  Medication Sig  . amLODipine (NORVASC) 10 MG tablet Take 10 mg by mouth daily.  Marland Kitchen aspirin EC 81 MG tablet Take 81 mg by mouth once.   Marland Kitchen atorvastatin (LIPITOR) 10 MG tablet TAKE (1) TABLET BY MOUTH AT BEDTIME.  . Calcium Citrate-Vitamin D (CALCIUM + D PO) Take by mouth 2 (two) times daily.  . carvedilol (COREG) 25 MG tablet Take 12.5 mg by mouth 2 (two) times  daily with a meal.   . cetirizine (ZYRTEC) 10 MG tablet Take 10 mg by mouth daily as needed for allergies.   . chlorthalidone (HYGROTON) 25 MG tablet Take 12.5 mg by mouth daily.   . Cholecalciferol (VITAMIN D) 2000 units CAPS Take by mouth.  . docusate sodium (COLACE) 100 MG capsule Take 100 mg by mouth 2 (two) times daily.  Marland Kitchen eplerenone (INSPRA) 50 MG tablet Take 150 mg by mouth 2 (two) times daily.   Marland Kitchen lisinopril (PRINIVIL,ZESTRIL) 40 MG tablet Take 40 mg by mouth every morning.  . metFORMIN (GLUCOPHAGE) 1000 MG tablet TAKE 1 TABLET BY MOUTH TWICE DAILY WITH A MEAL.  . Multiple Vitamins-Minerals (CENTRUM PO) Take 1 tablet by mouth every morning.  . sertraline (ZOLOFT) 50 MG tablet Take 1 tablet (50 mg total) by mouth daily.  . vitamin B-12 (CYANOCOBALAMIN) 1000 MCG tablet Take 1,000 mcg by mouth every morning.    BP 110/67   Pulse 87   Ht 5\' 9"  (1.753 m)   Wt 234 lb (106.1 kg)   BMI 34.56 kg/m   Physical Exam  Constitutional: He is oriented to person, place, and time. He appears well-developed and well-nourished.  Vital signs have been reviewed and are stable. Gen. appearance  the patient is well-developed and well-nourished with normal grooming and hygiene.   Neurological: He is alert and oriented to person, place, and time. Gait normal.  Skin: Skin is warm and dry. No erythema.  Psychiatric: He has a normal mood and affect.  Vitals reviewed.   Right Knee Exam   Muscle Strength  The patient has normal right knee strength.  Tenderness  The patient is experiencing tenderness in the lateral joint line and medial joint line.  Range of Motion  Extension: normal  Flexion: normal   Tests  McMurray:  Medial - negative Lateral - negative Varus: negative Valgus: negative Drawer:  Anterior - negative    Posterior - negative  Other  Erythema: absent Scars: absent Sensation: normal Pulse: present Swelling: none  Comments:  Mild varus no varus thrust on gait   Left Knee  Exam   Muscle Strength  The patient has normal left knee strength.  Tenderness  The patient is experiencing no tenderness.   Range of Motion  Extension: normal  Flexion: normal   Tests  McMurray:  Medial - negative Lateral - negative Varus: negative Valgus: negative Drawer:  Anterior - negative     Posterior - negative  Other  Erythema: absent Scars: absent Sensation: normal Pulse: present Swelling: none        MEDICAL DECISION SECTION  Xrays were done at Office  My independent reading of xrays:  Mild varus moderate to severe joint space narrowing minimal secondary bone change good bone quality, see dictated report  Encounter Diagnosis  Name Primary?  . Chronic pain of right knee Yes    PLAN: (Rx., injectx, surgery, frx, mri/ct) The patient is not having severe pain or functional loss at this time I think we can control his giving way with a brace.  He can continue to work on his A1c to get it under 7.  Although he is cleared from a cardiac standpoint he has a history of coronary artery disease and this combination does give him moderate risk for surgery.  Recommend x-ray again in 1 year  We will call him when his brace is in we will use the unloader brace  No orders of the defined types were placed in this encounter.   Arther Abbott, MD  12/01/2017 9:01 AM

## 2017-12-02 ENCOUNTER — Other Ambulatory Visit: Payer: Self-pay | Admitting: "Endocrinology

## 2017-12-09 ENCOUNTER — Telehealth: Payer: Self-pay | Admitting: Radiology

## 2017-12-09 NOTE — Telephone Encounter (Signed)
I left message for patient there is a knee brace here for him, want him to come in and try on make sure it fits, and then he can sign form for the brace.

## 2018-01-09 ENCOUNTER — Telehealth (HOSPITAL_COMMUNITY): Payer: Self-pay | Admitting: Physical Therapy

## 2018-01-09 NOTE — Telephone Encounter (Signed)
Wife called to change to this day pt had MD apptment on the 12th. I let Leafy Ro know that this change occurec. NF 01/09/18

## 2018-01-17 ENCOUNTER — Other Ambulatory Visit: Payer: Self-pay | Admitting: "Endocrinology

## 2018-01-20 ENCOUNTER — Encounter (HOSPITAL_COMMUNITY): Payer: Self-pay

## 2018-01-20 ENCOUNTER — Ambulatory Visit (HOSPITAL_COMMUNITY): Payer: Medicare Other

## 2018-01-21 ENCOUNTER — Ambulatory Visit (HOSPITAL_COMMUNITY): Payer: Medicare Other | Attending: Physician Assistant

## 2018-01-21 ENCOUNTER — Encounter (HOSPITAL_COMMUNITY): Payer: Self-pay

## 2018-01-21 ENCOUNTER — Other Ambulatory Visit: Payer: Self-pay

## 2018-01-21 DIAGNOSIS — R262 Difficulty in walking, not elsewhere classified: Secondary | ICD-10-CM | POA: Insufficient documentation

## 2018-01-21 DIAGNOSIS — R29898 Other symptoms and signs involving the musculoskeletal system: Secondary | ICD-10-CM

## 2018-01-21 DIAGNOSIS — M6281 Muscle weakness (generalized): Secondary | ICD-10-CM | POA: Diagnosis present

## 2018-01-21 DIAGNOSIS — M545 Low back pain, unspecified: Secondary | ICD-10-CM

## 2018-01-21 DIAGNOSIS — G8929 Other chronic pain: Secondary | ICD-10-CM | POA: Diagnosis present

## 2018-01-21 NOTE — Therapy (Signed)
[place], [place], [address] Phone: [phone]   Fax:  [phone]  Physical Therapy Evaluation  Patient Details  Name: Zachary Murillo. MRN: [id number] Date of Birth: December 18, 1950 Referring Provider (PT): Robyne Askew, [place]   Encounter Date: 01/21/2018  PT End of Session - 01/21/18 0920    Visit Number  1    Number of Visits  13    Date for PT Re-Evaluation  03/04/18   mini reassess 02/11/18   Authorization Type  [employer] Medicare    Authorization Time Period  01/21/18 to 03/04/18    Authorization - Visit Number  1    Authorization - Number of Visits  10    PT Start Time  0820    PT Stop Time  0900    PT Time Calculation (min)  40 min    Activity Tolerance  Patient tolerated treatment well    Behavior During Therapy  [facility] for tasks assessed/performed       Past Medical History:  Diagnosis Date  . Diabetes mellitus ([place])   . History of Roux-en-Y gastric bypass   . Hypertension   . Overweight(278.02)   . Sleep apnea   . Stroke [facility])     Past Surgical History:  Procedure Laterality Date  . BACK SURGERY    . CARDIAC CATHETERIZATION    . KNEE ARTHROSCOPY    . NASAL SEPTUM SURGERY      There were no vitals filed for this visit.   Subjective Assessment - 01/21/18 0823    Subjective  Pt reports that he had L4-S1 lumbar decompression and fusion in 2015. He states that his LBP has gradually worsened over the last year. He states that his latest imaging results showed disc buldges at L2-3 and L3-4 with significant nerve pinching at those levels. This mainly affects his walking; he can comfortably walk about 1 mile now but prior to his pain returning, he could walk unlimited. When he goes for walks, he uses a walking stick just in case because he says his balance is real bad but no falls in the last 6 months. His pain is located in his R lowre back; no radicular s/s or b/b issues since his pain has worsened.      Pertinent History  L4-S1 decompression and fusion in 2015    Limitations  Walking;Lifting    How long can you sit comfortably?  <1 hour; feels better to slump    How long can you stand comfortably?  unsure but it gets to him    How long can you walk comfortably?  1 mile or < (up and down inclines bother him)    Diagnostic tests  MRI    Patient Stated Goals  walk better and walk normal    Currently in Pain?  No/denies         [facility] PT Assessment - 01/21/18 0001      Assessment   Medical Diagnosis  spinal stenosis of lumbar region with radiculopathy    Referring Provider (PT)  Robyne Askew, PA-C    Onset Date/Surgical Date  --   gradually worsened over last year; L4-S1 fusion 2015   Next MD Visit  whenever    Prior Therapy  yes years ago      Balance Screen   Has the patient fallen in the past 6 months  No    Has the patient had a decrease in activity level because of a  fear of falling?   No    Is the patient reluctant to leave their home because of a fear of falling?   No      Prior Function   Level of Independence  Independent    Vocation  Retired;Part time employment    Cytogeneticist ed; retired from teaching    Leisure  travels a lot      Observation/Other Assessments   Focus on Therapeutic Outcomes ([place])   to be completed next visit      Sensation   Additional Comments  L2 DTRs - 3+ on RLE, 2+ on LLE      Posture/Postural Control   Posture/Postural Control  Postural limitations    Postural Limitations  Rounded Shoulders;Forward head;Increased thoracic kyphosis;Increased lumbar lordosis      ROM / Strength   AROM / PROM / Strength  AROM;Strength      AROM   AROM Assessment Site  Lumbar    Lumbar Flexion  25% limited, in lordosis    Lumbar Extension  WFL    Lumbar - Right Side Bend  knee joint, sharp pain R    Lumbar - Left Side Bend  knee joint, no pain    Lumbar - Right Rotation  50% limited, no pain    Lumbar - Left Rotation  50%  limited, no pain      Strength   Strength Assessment Site  Hip;Knee;Ankle    Right Hip Flexion  4/5    Right Hip Extension  4-/5    Right Hip ABduction  4/5    Left Hip Flexion  4/5    Left Hip Extension  4/5    Left Hip ABduction  4/5    Right Knee Flexion  5/5    Right Knee Extension  5/5    Left Knee Flexion  5/5    Left Knee Extension  5/5    Right Ankle Dorsiflexion  4+/5    Left Ankle Dorsiflexion  4+/5      Flexibility   Soft Tissue Assessment /Muscle Length  yes    Hamstrings  90/90: R: 137deg; L: 135deg      Palpation   Spinal mobility  hypomobile    Palpation comment  hypomobile throuhgout bil lumbar paraspinals, QL, distal lats in lower back; palpation to ~L2-3 paraspinals/QL/lats recreated his same lower back pain      Ambulation/Gait   Ambulation Distance (Feet)  526 Feet   3MWT   Assistive device  None    Gait Pattern  Step-through pattern;Decreased arm swing - right;Trendelenburg;Lateral trunk lean to right    Gait velocity  0.15m/s      Balance   Balance Assessed  Yes      Static Standing Balance   Static Standing - Balance Support  No upper extremity supported    Static Standing Balance -  Activities   Single Leg Stance - Right Leg;Single Leg Stance - Left Leg    Static Standing - Comment/# of Minutes  R: 5 sec or < L: 6sec or <      Standardized Balance Assessment   Standardized Balance Assessment  Five Times Sit to Stand    Five times sit to stand comments   18.7sec, chair, no UE           Objective measurements completed on examination: See above findings.         PT Education - 01/21/18 0919    Education Details  exam findings,  POC, HEP    Person(s) Educated  Patient    Methods  Explanation;Handout    Comprehension  Verbalized understanding       PT Short Term Goals - 01/21/18 8119      PT SHORT TERM GOAL #1   Title  Pt will have improved proximal hip strength to 4+/5 in order to maximize gait and reduce LBP.    Time  3     Period  Weeks    Status  New    Target Date  02/11/18      PT SHORT TERM GOAL #2   Title  Pt will be able to perform bil SLS for 10 sec in order to demo improved core and hip strength.     Time  3    Period  Weeks    Status  New      PT SHORT TERM GOAL #3   Title  Pt will have improved 5xSTS t0 12sec or < to demo improved functional and BLE strength.     Time  3    Period  Weeks    Status  New      PT SHORT TERM GOAL #4   Title  Pt will have improved bil HS flexibility to 145deg in 90/90 position to reduce his LBP.    Time  3    Period  Weeks    Status  New        PT Long Term Goals - 01/21/18 1478      PT LONG TERM GOAL #1   Title  Pt will have improved proximal hip strength to 5/5 in order to further improve gait and reduce LBP with walking.    Time  6    Period  Weeks    Status  New    Target Date  03/04/18      PT LONG TERM GOAL #2   Title  Pt will be able to perform bil SLS for 20 sec in order to further demo improved core and hip strength and maximize his ability to walk on uneven ground.    Time  6    Period  Weeks    Status  New      PT LONG TERM GOAL #3   Title  Pt will have at least 165ft improvement in 3MWT with average gait speed of 1.33m/s or better in order to maximize community access and improved tolerance to WB.    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     Time  6    Period  Weeks    Status  New      PT LONG TERM GOAL #5   Title  Pt will report being able to walk for 2 miles or more without increases in LBP to demo improved core and functional strength and allow him to explore cities with greater ease when traveling.    Time  6    Period  Weeks    Status  New             Plan - 01/21/18 2956    Clinical Impression Statement  Pt is pleasant  67YO M who presents to OPPT  with c/o R sided LBP, which has gradually worsened over the last year. He has h/o L4-S1 decompression and fusion in 2015 which affected his RLE and he also has h/o stroke which affected his R side. Pt currently presents with deficits in BLE, functional, and core strength, as well as deficits in flexibility, balance, gait, and functional mobility due to pain. Pt with R trunk lateral flexion during gait indicating weakness in R glute med and his proximal MMT was deficient, especially R glute med (4-/5). Pt with hyper-reflexive L2 DTR on R compared to L, however, feel this is residual from previous lumbar pathology and stroke which affected his R side. Pt also negative for slump testing bil. Feel pt's c/o pain are MSK in nature and not radicular from lumbar spine but will continue to follow if any radicular symptoms arise. Pt needs skilled PT intervention to address these impairments in order to reduce pain and promote return to PLOF.     Clinical Presentation  Stable    Clinical Presentation due to:  see flwosheets for objective tests and measures    Clinical Decision Making  Low    Rehab Potential  Fair    PT Frequency  2x / week    PT Duration  6 weeks    PT Treatment/Interventions  ADLs/Self Care Home Management;Aquatic Therapy;Cryotherapy;Electrical Stimulation;Moist Heat;DME Instruction;Gait training;Stair training;Functional mobility training;Therapeutic activities;Therapeutic exercise;Balance training;Neuromuscular re-education;Patient/family education;Manual techniques;Scar mobilization;Passive range of motion;Dry needling;Taping    PT Next Visit Plan  review goals, administer FOTO; initiate HS stretching, lats stretching, BLE, core, functional strengthening (especially glute med), and manual techniques for lumbar mm restrictions    PT Home Exercise Plan  eval: SKTC, bridging    Consulted and Agree with Plan of Care  Patient       Patient will benefit from skilled  therapeutic intervention in order to improve the following deficits and impairments:  Abnormal gait, Decreased activity tolerance, Decreased balance, Decreased endurance, Decreased range of motion, Decreased strength, Difficulty walking, Hypomobility, Increased fascial restricitons, Increased muscle spasms, Impaired flexibility, Improper body mechanics, Postural dysfunction, Obesity, Pain  Visit Diagnosis: Chronic right-sided low back pain without sciatica - Plan: PT plan of care cert/re-cert  Muscle weakness (generalized) - Plan: PT plan of care cert/re-cert  Difficulty in walking, not elsewhere classified - Plan: PT plan of care cert/re-cert  Other symptoms and signs involving the musculoskeletal system - Plan: PT plan of care cert/re-cert     Problem List Patient Active Problem List   Diagnosis Date Noted  . Type 2 diabetes mellitus with vascular disease ([place]) 01/30/2015  . Mixed hyperlipidemia 01/30/2015  . Essential hypertension, benign 01/30/2015  . Vitamin D deficiency 01/30/2015  . Class 1 obesity due to excess calories with serious comorbidity and body mass index (BMI) of 32.0 to 32.9 in adult 01/30/2015  . DDD (degenerative disc disease), 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/2013  . Difficulty in walking(719.7) 01/25/2013  . CVA (cerebral infarction) 01/22/2013  . Lack of coordination 01/22/2013  . OA (osteoarthritis) of knee 09/05/2011  . Knee pain, right 09/05/2011  . KNEE, ARTHRITIS, DEGEN./OSTEO 01/30/2009  . SHOULDER PAIN 01/16/2009  . KNEE PAIN 01/16/2009  . IMPINGEMENT SYNDROME 01/16/2009  . TENDINITIS, LEFT KNEE 01/16/2009         Geraldine Solar PT, DPT   [facility] [address] [place], [place], [address] Phone: [phone]   Fax:  [phone]  Name: Zachary Murillo. MRN: [id number] Date of Birth: 1950/03/21

## 2018-01-22 ENCOUNTER — Ambulatory Visit (HOSPITAL_COMMUNITY): Payer: Medicare Other

## 2018-01-22 ENCOUNTER — Encounter (HOSPITAL_COMMUNITY): Payer: Self-pay

## 2018-01-22 DIAGNOSIS — M545 Low back pain, unspecified: Secondary | ICD-10-CM

## 2018-01-22 DIAGNOSIS — R29898 Other symptoms and signs involving the musculoskeletal system: Secondary | ICD-10-CM

## 2018-01-22 DIAGNOSIS — R262 Difficulty in walking, not elsewhere classified: Secondary | ICD-10-CM

## 2018-01-22 DIAGNOSIS — G8929 Other chronic pain: Secondary | ICD-10-CM

## 2018-01-22 DIAGNOSIS — M6281 Muscle weakness (generalized): Secondary | ICD-10-CM

## 2018-01-22 NOTE — Patient Instructions (Signed)
Lower Trunk Rotation Stretch    Keeping back flat and feet together, rotate knees to left side. Hold _10___ seconds. Repeat _5___ times per set. Do _1___ sets per session. Do 1____ sessions per day.  http://orth.exer.us/122   Copyright  VHI. All rights reserved.    Hamstring Stretch: Active    Support behind right knee. Starting with knee bent, attempt to straighten knee until a comfortable stretch is felt in back of thigh. Hold 20____ seconds. Repeat ____ times per set. Do ____ sets per session. Do ____ sessions per day.  http://orth.exer.us/158   Copyright  VHI. All rights reserved.    Straight Leg Raise    Tighten stomach and slowly raise locked right leg 9-12__ inches from floor. Repeat 10-15_ times per set. Do _1___ sets per session. Do 1____ sessions per day.  http://orth.exer.us/1102   Copyright  VHI. All rights reserved.    HIP: Abduction / External Rotation (Band)    Place band around knees. Lie on side with hips and knees bent. Raise top knee up, squeezing glutes. Keep feet together. Hold 3-5___ seconds. Use red_ band. 10-15_ reps per set, 1___ sets per day.  Copyright  VHI. All rights reserved.

## 2018-01-22 NOTE — Therapy (Signed)
[place], [place], [address] Phone: [phone]   Fax:  [phone]  Physical Therapy Treatment  Patient Details  Name: Zachary Murillo. MRN: [id number] Date of Birth: 19-Jul-1950 Referring Provider (PT): Robyne Askew, [place]   Encounter Date: 01/22/2018  PT End of Session - 01/22/18 1258    Visit Number  2    Number of Visits  13    Date for PT Re-Evaluation  03/04/18   mini reassess 02/11/18   Authorization Type  [employer] Medicare    Authorization Time Period  01/21/18 to 03/04/18    Authorization - Visit Number  2    Authorization - Number of Visits  10    PT Start Time  1300    PT Stop Time  1345    PT Time Calculation (min)  45 min    Activity Tolerance  Patient tolerated treatment well    Behavior During Therapy  [facility] for tasks assessed/performed       Past Medical History:  Diagnosis Date  . Diabetes mellitus ([place])   . History of Roux-en-Y gastric bypass   . Hypertension   . Overweight(278.02)   . Sleep apnea   . Stroke [facility])     Past Surgical History:  Procedure Laterality Date  . BACK SURGERY    . CARDIAC CATHETERIZATION    . KNEE ARTHROSCOPY    . NASAL SEPTUM SURGERY      There were no vitals filed for this visit.  Subjective Assessment - 01/22/18 1254    Subjective  3/10 pain today in right low back. Did his HEP last night after evaluation.     Pertinent History  L4-S1 decompression and fusion in 2015    Limitations  Walking;Lifting    How long can you sit comfortably?  <1 hour; feels better to slump    How long can you stand comfortably?  unsure but it gets to him    How long can you walk comfortably?  1 mile or < (up and down inclines bother him)    Diagnostic tests  MRI    Patient Stated Goals  walk better and walk normal                       [facility] Adult PT Treatment/Exercise - 01/22/18 0001      Exercises   Exercises  Lumbar      Lumbar Exercises: Stretches    Active Hamstring Stretch  Right;Left;3 reps;10 seconds    Single Knee to Chest Stretch  Right;Left;3 reps;10 seconds    Lower Trunk Rotation  5 reps;10 seconds      Lumbar Exercises: Supine   Bridge  Non-compliant;5 reps;10 reps;5 seconds      Manual Therapy   Manual Therapy  Soft tissue mobilization    Manual therapy comments  completed separate from all other skilled interventions    Soft tissue mobilization  quadratus lumborum, lumbar paraspinals             PT Education - 01/22/18 1254    Education Details  Discussed purpose and technique of interventions throughout session. Reviewed goals, eval and HEP.    Person(s) Educated  Patient    Methods  Explanation    Comprehension  Verbalized understanding       PT Short Term Goals - 01/21/18 0922      PT SHORT TERM GOAL #1   Title  Pt will have improved proximal  hip strength to 4+/5 in order to maximize gait and reduce LBP.    Time  3    Period  Weeks    Status  New    Target Date  02/11/18      PT SHORT TERM GOAL #2   Title  Pt will be able to perform bil SLS for 10 sec in order to demo improved core and hip strength.     Time  3    Period  Weeks    Status  New      PT SHORT TERM GOAL #3   Title  Pt will have improved 5xSTS t0 12sec or < to demo improved functional and BLE strength.     Time  3    Period  Weeks    Status  New      PT SHORT TERM GOAL #4   Title  Pt will have improved bil HS flexibility to 145deg in 90/90 position to reduce his LBP.    Time  3    Period  Weeks    Status  New        PT Long Term Goals - 01/21/18 5784      PT LONG TERM GOAL #1   Title  Pt will have improved proximal hip strength to 5/5 in order to further improve gait and reduce LBP with walking.    Time  6    Period  Weeks    Status  New    Target Date  03/04/18      PT LONG TERM GOAL #2   Title  Pt will be able to perform bil SLS for 20 sec in order to further demo improved core and hip strength and maximize his  ability to walk on uneven ground.    Time  6    Period  Weeks    Status  New      PT LONG TERM GOAL #3   Title  Pt will have at least 126ft improvement in 3MWT with average gait speed of 1.46m/s or better in order to maximize community access and improved tolerance to WB.    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     Time  6    Period  Weeks    Status  New      PT LONG TERM GOAL #5   Title  Pt will report being able to walk for 2 miles or more without increases in LBP to demo improved core and functional strength and allow him to explore cities with greater ease when traveling.    Time  6    Period  Weeks    Status  New            Plan - 01/22/18 1258    Clinical Impression Statement  Reviewed evaluation, goals and initial HEP with patient. Continued with established plan of care. Added side-lying clams, straight leg raises in flexion, abduction and extension, hamstring stretch and lower trunk rotation during therapeutic exercise and added sidelying clams, hamstring stretch and lower trunk rotation to HEP. Soft tissue mobilizations performed to address soft tissue adhesions in right paraspinals and quadratus lumborum to decreased patient pain.  Pain report at end of session 0/10.    Rehab Potential  Fair    PT Frequency  2x / week    PT Duration  6 weeks    PT Treatment/Interventions  ADLs/Self Care Home Management;Aquatic Therapy;Cryotherapy;Electrical Stimulation;Moist Heat;DME Instruction;Gait training;Stair training;Functional mobility training;Therapeutic activities;Therapeutic exercise;Balance training;Neuromuscular re-education;Patient/family education;Manual techniques;Scar mobilization;Passive range of motion;Dry needling;Taping    PT Next Visit Plan  lats stretching, BLE, core,  functional strengthening (especially glute med), and manual techniques for lumbar mm restrictions, Rt QL and paraspinals    PT Home Exercise Plan  eval: SKTC, bridging; hamstring stretch, LTR, SLR flexion, sidelying clams with RTB    Consulted and Agree with Plan of Care  Patient       Patient will benefit from skilled therapeutic intervention in order to improve the following deficits and impairments:  Abnormal gait, Decreased activity tolerance, Decreased balance, Decreased endurance, Decreased range of motion, Decreased strength, Difficulty walking, Hypomobility, Increased fascial restricitons, Increased muscle spasms, Impaired flexibility, Improper body mechanics, Postural dysfunction, Obesity, Pain  Visit Diagnosis: Chronic right-sided low back pain without sciatica  Muscle weakness (generalized)  Difficulty in walking, not elsewhere classified  Other symptoms and signs involving the musculoskeletal system     Problem List Patient Active Problem List   Diagnosis Date Noted  . Type 2 diabetes mellitus with vascular disease ([place]) 01/30/2015  . Mixed hyperlipidemia 01/30/2015  . Essential hypertension, benign 01/30/2015  . Vitamin D deficiency 01/30/2015  . Class 1 obesity due to excess calories with serious comorbidity and body mass index (BMI) of 32.0 to 32.9 in adult 01/30/2015  . DDD (degenerative disc disease), 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/2013  . Difficulty in walking(719.7) 01/25/2013  . CVA (cerebral infarction) 01/22/2013  . Lack of coordination 01/22/2013  . OA (osteoarthritis) of knee 09/05/2011  . Knee pain, right 09/05/2011  . KNEE, ARTHRITIS, DEGEN./OSTEO 01/30/2009  . SHOULDER PAIN 01/16/2009  . KNEE PAIN 01/16/2009  . IMPINGEMENT SYNDROME 01/16/2009  . TENDINITIS, LEFT KNEE 01/16/2009    Floria Raveling. Hartnett-Rands, MS, PT Per [facility] #[id number] 01/22/2018, 2:37 PM  [place], [place], [address] Phone: [phone]   Fax:  [phone]  Name: Zachary Murillo. MRN: [id number] Date of Birth: Jul 13, 1950

## 2018-01-27 ENCOUNTER — Ambulatory Visit (HOSPITAL_COMMUNITY): Payer: Medicare Other

## 2018-01-27 ENCOUNTER — Encounter (HOSPITAL_COMMUNITY): Payer: Self-pay

## 2018-01-27 DIAGNOSIS — M545 Low back pain: Principal | ICD-10-CM

## 2018-01-27 DIAGNOSIS — M6281 Muscle weakness (generalized): Secondary | ICD-10-CM

## 2018-01-27 DIAGNOSIS — G8929 Other chronic pain: Secondary | ICD-10-CM

## 2018-01-27 DIAGNOSIS — R262 Difficulty in walking, not elsewhere classified: Secondary | ICD-10-CM

## 2018-01-27 DIAGNOSIS — R29898 Other symptoms and signs involving the musculoskeletal system: Secondary | ICD-10-CM

## 2018-01-27 NOTE — Therapy (Signed)
[place], [place], [address] Phone: [phone]   Fax:  [phone]  Physical Therapy Treatment  Patient Details  Name: Zachary Murillo. MRN: [id number] Date of Birth: 1951-02-14 Referring Provider (PT): Robyne Askew, [place]   Encounter Date: 01/27/2018  PT End of Session - 01/27/18 1123    Visit Number  3    Number of Visits  13    Date for PT Re-Evaluation  03/04/18   Minireassess 02/11/18   Authorization Type  [employer] Medicare    Authorization Time Period  01/21/18 to 03/04/18    Authorization - Visit Number  3    Authorization - Number of Visits  10    PT Start Time  1120    PT Stop Time  1203    PT Time Calculation (min)  43 min    Activity Tolerance  Patient tolerated treatment well    Behavior During Therapy  [facility] for tasks assessed/performed       Past Medical History:  Diagnosis Date  . Diabetes mellitus ([place])   . History of Roux-en-Y gastric bypass   . Hypertension   . Overweight(278.02)   . Sleep apnea   . Stroke [facility])     Past Surgical History:  Procedure Laterality Date  . BACK SURGERY    . CARDIAC CATHETERIZATION    . KNEE ARTHROSCOPY    . NASAL SEPTUM SURGERY      There were no vitals filed for this visit.  Subjective Assessment - 01/27/18 1123    Subjective  Pt stated he is feeling good today, has been compliant with HEP at least once a day.    Pertinent History  L4-S1 decompression and fusion in 2015    Patient Stated Goals  walk better and walk normal    Currently in Pain?  No/denies                       [facility] Adult PT Treatment/Exercise - 01/27/18 0001      Exercises   Exercises  Lumbar      Lumbar Exercises: Stretches   Active Hamstring Stretch  Right;Left;3 reps;30 seconds    Active Hamstring Stretch Limitations  rope for 2nd set    Single Knee to Chest Stretch  Right;Left;2 reps;30 seconds    Lower Trunk Rotation  5 reps;10 seconds    Other Lumbar  Stretch Exercise  Child's pose 3x 30"      Lumbar Exercises: Supine   Bridge  10 reps;5 seconds    Bridge Limitations  2 sets       Lumbar Exercises: Sidelying   Clam  Both;10 reps;5 seconds    Clam Limitations  RTB    Hip Abduction  10 reps;3 seconds;Both      Manual Therapy   Manual Therapy  Soft tissue mobilization    Manual therapy comments  completed separate from all other skilled interventions    Soft tissue mobilization  quadratus lumborum, lumbar paraspinals               PT Short Term Goals - 01/21/18 4128      PT SHORT TERM GOAL #1   Title  Pt will have improved proximal hip strength to 4+/5 in order to maximize gait and reduce LBP.    Time  3    Period  Weeks    Status  New    Target Date  02/11/18  PT SHORT TERM GOAL #2   Title  Pt will be able to perform bil SLS for 10 sec in order to demo improved core and hip strength.     Time  3    Period  Weeks    Status  New      PT SHORT TERM GOAL #3   Title  Pt will have improved 5xSTS t0 12sec or < to demo improved functional and BLE strength.     Time  3    Period  Weeks    Status  New      PT SHORT TERM GOAL #4   Title  Pt will have improved bil HS flexibility to 145deg in 90/90 position to reduce his LBP.    Time  3    Period  Weeks    Status  New        PT Long Term Goals - 01/21/18 3016      PT LONG TERM GOAL #1   Title  Pt will have improved proximal hip strength to 5/5 in order to further improve gait and reduce LBP with walking.    Time  6    Period  Weeks    Status  New    Target Date  03/04/18      PT LONG TERM GOAL #2   Title  Pt will be able to perform bil SLS for 20 sec in order to further demo improved core and hip strength and maximize his ability to walk on uneven ground.    Time  6    Period  Weeks    Status  New      PT LONG TERM GOAL #3   Title  Pt will have at least 116ft improvement in 3MWT with average gait speed of 1.75m/s or better in order to maximize community  access and improved tolerance to WB.    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     Time  6    Period  Weeks    Status  New      PT LONG TERM GOAL #5   Title  Pt will report being able to walk for 2 miles or more without increases in LBP to demo improved core and functional strength and allow him to explore cities with greater ease when traveling.    Time  6    Period  Weeks    Status  New            Plan - 01/27/18 1214    Clinical Impression Statement  Session focus on lumbar mobility and gluteal strengthening.  Added child's pose to address restricitons in lats with reports of relief of tightness in adductors as well.  Added sets with gluteal exercises as well for strengthening with min cueing for proper form with abduction exercises.  EOS with manual to address soft tissue adhesions in right paraspinals and one trigger point noted with palpation to Rt quadratus lumborum.  No reports of pain through session.      Rehab Potential  Fair    PT Frequency  2x / week    PT Duration  6 weeks    PT Treatment/Interventions  ADLs/Self Care Home Management;Aquatic Therapy;Cryotherapy;Electrical Stimulation;Moist Heat;DME Instruction;Gait training;Stair training;Functional mobility training;Therapeutic activities;Therapeutic exercise;Balance training;Neuromuscular re-education;Patient/family education;Manual techniques;Scar mobilization;Passive range of motion;Dry needling;Taping    PT Next Visit Plan  Next session progress to quadruped activities and begin SLS and sidestepping.  Continue lats stretching, BLE, core, functional strengthening (especially glute med), and manual techniques for lumbar mm restrictions, Rt QL and paraspinals    PT Home Exercise Plan  eval: SKTC, bridging; hamstring stretch,  LTR, SLR flexion, sidelying clams with RTB       Patient will benefit from skilled therapeutic intervention in order to improve the following deficits and impairments:  Abnormal gait, Decreased activity tolerance, Decreased balance, Decreased endurance, Decreased range of motion, Decreased strength, Difficulty walking, Hypomobility, Increased fascial restricitons, Increased muscle spasms, Impaired flexibility, Improper body mechanics, Postural dysfunction, Obesity, Pain  Visit Diagnosis: Chronic right-sided low back pain without sciatica  Muscle weakness (generalized)  Other symptoms and signs involving the musculoskeletal system  Difficulty in walking, not elsewhere classified     Problem List Patient Active Problem List   Diagnosis Date Noted  . Type 2 diabetes mellitus with vascular disease ([place]) 01/30/2015  . Mixed hyperlipidemia 01/30/2015  . Essential hypertension, benign 01/30/2015  . Vitamin D deficiency 01/30/2015  . Class 1 obesity due to excess calories with serious comorbidity and body mass index (BMI) of 32.0 to 32.9 in adult 01/30/2015  . DDD (degenerative disc disease), 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/2013  . Difficulty in walking(719.7) 01/25/2013  . CVA (cerebral infarction) 01/22/2013  . Lack of coordination 01/22/2013  . OA (osteoarthritis) of knee 09/05/2011  . Knee pain, right 09/05/2011  . KNEE, ARTHRITIS, DEGEN./OSTEO 01/30/2009  . SHOULDER PAIN 01/16/2009  . KNEE PAIN 01/16/2009  . IMPINGEMENT SYNDROME 01/16/2009  . TENDINITIS, LEFT KNEE 01/16/2009   Ihor Austin, LPTA; CBIS [phone]  Aldona Lento 01/27/2018, 12:20 PM  [place] [address] [place], [place], [address] Phone: [phone]   Fax:  [phone]  Name: Zachary Murillo. MRN: [id number] Date of Birth: 08/17/1950

## 2018-01-30 ENCOUNTER — Encounter (HOSPITAL_COMMUNITY): Payer: Self-pay

## 2018-01-30 ENCOUNTER — Ambulatory Visit (HOSPITAL_COMMUNITY): Payer: Medicare Other

## 2018-01-30 DIAGNOSIS — M545 Low back pain, unspecified: Secondary | ICD-10-CM

## 2018-01-30 DIAGNOSIS — G8929 Other chronic pain: Secondary | ICD-10-CM

## 2018-01-30 DIAGNOSIS — R29898 Other symptoms and signs involving the musculoskeletal system: Secondary | ICD-10-CM

## 2018-01-30 DIAGNOSIS — R262 Difficulty in walking, not elsewhere classified: Secondary | ICD-10-CM

## 2018-01-30 DIAGNOSIS — M6281 Muscle weakness (generalized): Secondary | ICD-10-CM

## 2018-01-30 NOTE — Patient Instructions (Signed)
SINGLE LIMB STANCE    Stance: single leg on floor. Raise leg. Hold 30 seconds. Repeat with other leg. 5 reps per set, 1-2 sets per day, at least 5 days per week  Copyright  VHI. All rights reserved.   Functional Quadriceps: Sit to Stand    Sit on edge of chair, feet flat on floor. Stand upright, extending knees fully. Repeat 10 times per set. Do 1-2 sets per day.  http://orth.exer.us/734   Copyright  VHI. All rights reserved.

## 2018-01-30 NOTE — Therapy (Signed)
[place], [place], [address] Phone: [phone]   Fax:  [phone]  Physical Therapy Treatment  Patient Details  Name: Zachary Murillo. MRN: [id number] Date of Birth: 1950/08/20 Referring Provider (PT): Robyne Askew, [place]   Encounter Date: 01/30/2018  PT End of Session - 01/30/18 1028    Visit Number  4    Number of Visits  13    Date for PT Re-Evaluation  03/04/18   Minireassess 02/11/18   Authorization Type  [employer]HC Medicare    Authorization Time Period  01/21/18 to 03/04/18    Authorization - Visit Number  4    Authorization - Number of Visits  10    PT Start Time  1025    PT Stop Time  1112    PT Time Calculation (min)  47 min    Activity Tolerance  Patient tolerated treatment well    Behavior During Therapy  [facility] for tasks assessed/performed       Past Medical History:  Diagnosis Date  . Diabetes mellitus ([place])   . History of Roux-en-Y gastric bypass   . Hypertension   . Overweight(278.02)   . Sleep apnea   . Stroke [facility])     Past Surgical History:  Procedure Laterality Date  . BACK SURGERY    . CARDIAC CATHETERIZATION    . KNEE ARTHROSCOPY    . NASAL SEPTUM SURGERY      There were no vitals filed for this visit.  Subjective Assessment - 01/30/18 1026    Subjective  Pt stated he is feeling good today.  Has been compliant with HEP and reports they are getting easier.  Has plans in the [place] for Thanksgiving.      Pertinent History  L4-S1 decompression and fusion in 2015    Patient Stated Goals  walk better and walk normal                       [facility] Adult PT Treatment/Exercise - 01/30/18 0001      Exercises   Exercises  Lumbar      Lumbar Exercises: Stretches   Other Lumbar Stretch Exercise  Child's pose 3x 30" (arms in ER)      Lumbar Exercises: Standing   Functional Squats  10 reps    Other Standing Lumbar Exercises  Sidestep 2RT with RTB; tandem stance on foam 2x  30"    Other Standing Lumbar Exercises  SLS Lt 17", Rt 8" max      Lumbar Exercises: Seated   Sit to Stand  10 reps    Sit to Stand Limitations  no HHA, eccentric control lowering      Lumbar Exercises: Supine   Bridge  15 reps;5 seconds    Bridge Limitations  2 sets       Lumbar Exercises: Sidelying   Clam  Both;15 reps    Clam Limitations  RTB    Hip Abduction  15 reps;3 seconds      Lumbar Exercises: Quadruped   Single Arm Raise  Right;Left;5 reps;5 seconds    Straight Leg Raise  5 reps;5 seconds      Manual Therapy   Manual Therapy  Soft tissue mobilization    Manual therapy comments  completed separate from all other skilled interventions    Soft tissue mobilization  quadratus lumborum, lumbar paraspinals               PT Short  Term Goals - 01/21/18 0922      PT SHORT TERM GOAL #1   Title  Pt will have improved proximal hip strength to 4+/5 in order to maximize gait and reduce LBP.    Time  3    Period  Weeks    Status  New    Target Date  02/11/18      PT SHORT TERM GOAL #2   Title  Pt will be able to perform bil SLS for 10 sec in order to demo improved core and hip strength.     Time  3    Period  Weeks    Status  New      PT SHORT TERM GOAL #3   Title  Pt will have improved 5xSTS t0 12sec or < to demo improved functional and BLE strength.     Time  3    Period  Weeks    Status  New      PT SHORT TERM GOAL #4   Title  Pt will have improved bil HS flexibility to 145deg in 90/90 position to reduce his LBP.    Time  3    Period  Weeks    Status  New        PT Long Term Goals - 01/21/18 0347      PT LONG TERM GOAL #1   Title  Pt will have improved proximal hip strength to 5/5 in order to further improve gait and reduce LBP with walking.    Time  6    Period  Weeks    Status  New    Target Date  03/04/18      PT LONG TERM GOAL #2   Title  Pt will be able to perform bil SLS for 20 sec in order to further demo improved core and hip strength  and maximize his ability to walk on uneven ground.    Time  6    Period  Weeks    Status  New      PT LONG TERM GOAL #3   Title  Pt will have at least 155ft improvement in 3MWT with average gait speed of 1.28m/s or better in order to maximize community access and improved tolerance to WB.    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     Time  6    Period  Weeks    Status  New      PT LONG TERM GOAL #5   Title  Pt will report being able to walk for 2 miles or more without increases in LBP to demo improved core and functional strength and allow him to explore cities with greater ease when traveling.    Time  6    Period  Weeks    Status  New            Plan - 01/30/18 1038    Clinical Impression Statement  Progressed lumbar stability and gluteal strengthening with additional exercises including quadruped UE and LE raises, sidestep, squats and SLS.  Pt able to complete wtih good form following initial instructions for mechanics.  No reports of pain through session.  EOS  with manual soft tissue mobilizaiotn to address restrctions in Rt quadratus lumborum.  Pt given advanced HEP included SLS and STS.      Rehab Potential  Fair    PT Frequency  2x / week    PT Duration  6 weeks    PT Treatment/Interventions  ADLs/Self Care Home Management;Aquatic Therapy;Cryotherapy;Electrical Stimulation;Moist Heat;DME Instruction;Gait training;Stair training;Functional mobility training;Therapeutic activities;Therapeutic exercise;Balance training;Neuromuscular re-education;Patient/family education;Manual techniques;Scar mobilization;Passive range of motion;Dry needling;Taping    PT Next Visit Plan  Continue quadruped activities and progress UE/LE (if comfortable for Rt shoulder).  Next session add vector stance  and lunges.  Continue lats stretching, BLE, core, functional strengthening (especially glute med), and manual techniques for lumbar mm restrictions, Rt QL and paraspinals    PT Home Exercise Plan  eval: SKTC, bridging; hamstring stretch, LTR, SLR flexion, sidelying clams with RTB; 01/30/18: STS and SLS       Patient will benefit from skilled therapeutic intervention in order to improve the following deficits and impairments:  Abnormal gait, Decreased activity tolerance, Decreased balance, Decreased endurance, Decreased range of motion, Decreased strength, Difficulty walking, Hypomobility, Increased fascial restricitons, Increased muscle spasms, Impaired flexibility, Improper body mechanics, Postural dysfunction, Obesity, Pain  Visit Diagnosis: Chronic right-sided low back pain without sciatica  Muscle weakness (generalized)  Other symptoms and signs involving the musculoskeletal system  Difficulty in walking, not elsewhere classified     Problem List Patient Active Problem List   Diagnosis Date Noted  . Type 2 diabetes mellitus with vascular disease ([place]) 01/30/2015  . Mixed hyperlipidemia 01/30/2015  . Essential hypertension, benign 01/30/2015  . Vitamin D deficiency 01/30/2015  . Class 1 obesity due to excess calories with serious comorbidity and body mass index (BMI) of 32.0 to 32.9 in adult 01/30/2015  . DDD (degenerative disc disease), 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/2013  . Difficulty in walking(719.7) 01/25/2013  . CVA (cerebral infarction) 01/22/2013  . Lack of coordination 01/22/2013  . OA (osteoarthritis) of knee 09/05/2011  . Knee pain, right 09/05/2011  . KNEE, ARTHRITIS, DEGEN./OSTEO 01/30/2009  . SHOULDER PAIN 01/16/2009  . KNEE PAIN 01/16/2009  . IMPINGEMENT SYNDROME 01/16/2009  . TENDINITIS, LEFT KNEE 01/16/2009   Ihor Austin, LPTA; CBIS [phone]  Aldona Lento 01/30/2018, 11:19 AM  [place], [place], [address] Phone: [phone]   Fax:  [phone]  Name: Zachary Murillo. MRN: [id number] Date of Birth: Oct 03, 1950

## 2018-02-10 ENCOUNTER — Other Ambulatory Visit: Payer: Self-pay

## 2018-02-10 ENCOUNTER — Ambulatory Visit (HOSPITAL_COMMUNITY): Payer: Medicare Other | Attending: Physician Assistant | Admitting: Physical Therapy

## 2018-02-10 DIAGNOSIS — R29898 Other symptoms and signs involving the musculoskeletal system: Secondary | ICD-10-CM | POA: Insufficient documentation

## 2018-02-10 DIAGNOSIS — M545 Low back pain: Secondary | ICD-10-CM | POA: Insufficient documentation

## 2018-02-10 DIAGNOSIS — M6281 Muscle weakness (generalized): Secondary | ICD-10-CM | POA: Diagnosis present

## 2018-02-10 DIAGNOSIS — R262 Difficulty in walking, not elsewhere classified: Secondary | ICD-10-CM | POA: Diagnosis present

## 2018-02-10 DIAGNOSIS — G8929 Other chronic pain: Secondary | ICD-10-CM | POA: Diagnosis present

## 2018-02-10 NOTE — Therapy (Signed)
[place], [place], [address] Phone: [phone]   Fax:  [phone]  Physical Therapy Treatment  Patient Details  Name: Zachary Murillo. MRN: [id number] Date of Birth: [birth year] Referring Provider (PT): Robyne Askew, [place]   Encounter Date: 02/10/2018  PT End of Session - 02/10/18 0931    Visit Number  5    Number of Visits  13    Date for PT Re-Evaluation  03/04/18   Minireassess 02/11/18   Authorization Type  [employer] Medicare    Authorization Time Period  01/21/18 to 03/04/18    Authorization - Visit Number  5    Authorization - Number of Visits  10    PT Start Time  0858    PT Stop Time  0940    PT Time Calculation (min)  42 min    Activity Tolerance  Patient tolerated treatment well    Behavior During Therapy  [facility] for tasks assessed/performed       Past Medical History:  Diagnosis Date  . Diabetes mellitus ([place])   . History of Roux-en-Y gastric bypass   . Hypertension   . Overweight(278.02)   . Sleep apnea   . Stroke [facility])     Past Surgical History:  Procedure Laterality Date  . BACK SURGERY    . CARDIAC CATHETERIZATION    . KNEE ARTHROSCOPY    . NASAL SEPTUM SURGERY      There were no vitals filed for this visit.  Subjective Assessment - 02/10/18 0900    Subjective  Pt states that his back is feeling much better today; no questions on his HEP     Pertinent History  L4-S1 decompression and fusion in 2015    Patient Stated Goals  walk better and walk normal    Currently in Pain?  No/denies                       [facility] Adult PT Treatment/Exercise - 02/10/18 0001      Exercises   Exercises  Lumbar      Lumbar Exercises: Stretches   Active Hamstring Stretch  Right;Left;3 reps;30 seconds    Single Knee to Chest Stretch  Right;Left;2 reps;30 seconds    Standing Side Bend  Right;Left;5 reps    Standing Extension  5 reps      Lumbar Exercises: Standing   Other Standing  Lumbar Exercises  vector stance 10" x 3 B       Lumbar Exercises: Seated   Sit to Stand  10 reps      Lumbar Exercises: Supine   Bridge  15 reps      Lumbar Exercises: Sidelying   Clam  Both;15 reps    Clam Limitations  RTB      Lumbar Exercises: Quadruped   [place]  5 reps    Opposite Arm/Leg Raise  Right arm/Left leg;Left arm/Right leg;5 reps    Other Quadruped Lumbar Exercises  child pose x 3 30"      Manual Therapy   Manual Therapy  Soft tissue mobilization    Manual therapy comments  completed separate from all other skilled interventions    Soft tissue mobilization  scar massage                PT Short Term Goals - 01/21/18 4431      PT SHORT TERM GOAL #1   Title  Pt will have improved proximal hip  strength to 4+/5 in order to maximize gait and reduce LBP.    Time  3    Period  Weeks    Status  New    Target Date  02/11/18      PT SHORT TERM GOAL #2   Title  Pt will be able to perform bil SLS for 10 sec in order to demo improved core and hip strength.     Time  3    Period  Weeks    Status  New      PT SHORT TERM GOAL #3   Title  Pt will have improved 5xSTS t0 12sec or < to demo improved functional and BLE strength.     Time  3    Period  Weeks    Status  New      PT SHORT TERM GOAL #4   Title  Pt will have improved bil HS flexibility to 145deg in 90/90 position to reduce his LBP.    Time  3    Period  Weeks    Status  New        PT Long Term Goals - 01/21/18 3662      PT LONG TERM GOAL #1   Title  Pt will have improved proximal hip strength to 5/5 in order to further improve gait and reduce LBP with walking.    Time  6    Period  Weeks    Status  New    Target Date  03/04/18      PT LONG TERM GOAL #2   Title  Pt will be able to perform bil SLS for 20 sec in order to further demo improved core and hip strength and maximize his ability to walk on uneven ground.    Time  6    Period  Weeks    Status  New      PT LONG TERM GOAL  #3   Title  Pt will have at least 159ft improvement in 3MWT with average gait speed of 1.78m/s or better in order to maximize community access and improved tolerance to WB.    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     Time  6    Period  Weeks    Status  New      PT LONG TERM GOAL #5   Title  Pt will report being able to walk for 2 miles or more without increases in LBP to demo improved core and functional strength and allow him to explore cities with greater ease when traveling.    Time  6    Period  Weeks    Status  New            Plan - 02/10/18 0931    Clinical Impression Statement  This treatment focused on improving flexibility, decreasing scar massage and strengthening.  New exercises added per flowsheet with good form after verbal and manual cuing     Rehab Potential  Fair    PT Frequency  2x / week    PT Duration  6 weeks    PT Treatment/Interventions  ADLs/Self Care Home Management;Aquatic Therapy;Cryotherapy;Electrical Stimulation;Moist Heat;DME Instruction;Gait training;Stair training;Functional  mobility training;Therapeutic activities;Therapeutic exercise;Balance training;Neuromuscular re-education;Patient/family education;Manual techniques;Scar mobilization;Passive range of motion;Dry needling;Taping    PT Next Visit Plan   Add lunging continue with side stepping with tband,  Continue lats stretching, BLE, core, functional strengthening (especially glute med), and manual techniques for lumbar mm restrictions, Rt QL and paraspinals    PT Home Exercise Plan  eval: SKTC, bridging; hamstring stretch, LTR, SLR flexion, sidelying clams with RTB; 01/30/18: STS and SLS       Patient will benefit from skilled therapeutic intervention in order to improve the following deficits and  impairments:  Abnormal gait, Decreased activity tolerance, Decreased balance, Decreased endurance, Decreased range of motion, Decreased strength, Difficulty walking, Hypomobility, Increased fascial restricitons, Increased muscle spasms, Impaired flexibility, Improper body mechanics, Postural dysfunction, Obesity, Pain  Visit Diagnosis: Chronic right-sided low back pain without sciatica  Muscle weakness (generalized)  Other symptoms and signs involving the musculoskeletal system  Difficulty in walking, not elsewhere classified     Problem List Patient Active Problem List   Diagnosis Date Noted  . Type 2 diabetes mellitus with vascular disease ([place]) 01/30/2015  . Mixed hyperlipidemia 01/30/2015  . Essential hypertension, benign 01/30/2015  . Vitamin D deficiency 01/30/2015  . Class 1 obesity due to excess calories with serious comorbidity and body mass index (BMI) of 32.0 to 32.9 in adult 01/30/2015  . DDD (degenerative disc disease), 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/2013  . Difficulty in walking(719.7) 01/25/2013  . CVA (cerebral infarction) 01/22/2013  . Lack of coordination 01/22/2013  . OA (osteoarthritis) of knee 09/05/2011  . Knee pain, right 09/05/2011  . KNEE, ARTHRITIS, DEGEN./OSTEO 01/30/2009  . SHOULDER PAIN 01/16/2009  . KNEE PAIN 01/16/2009  . IMPINGEMENT SYNDROME 01/16/2009  . TENDINITIS, LEFT KNEE 01/16/2009    Rayetta Humphrey, PT CLT [phone] 02/10/2018, 9:47 AM  [place] [address] [place], [place], [address] Phone: [phone]   Fax:  [phone]  Name: Zachary Murillo. MRN: [id number] Date of Birth: October 12, 1950

## 2018-02-13 ENCOUNTER — Ambulatory Visit (HOSPITAL_COMMUNITY): Payer: Medicare Other

## 2018-02-13 ENCOUNTER — Encounter (HOSPITAL_COMMUNITY): Payer: Self-pay

## 2018-02-13 DIAGNOSIS — R262 Difficulty in walking, not elsewhere classified: Secondary | ICD-10-CM

## 2018-02-13 DIAGNOSIS — M545 Low back pain, unspecified: Secondary | ICD-10-CM

## 2018-02-13 DIAGNOSIS — R29898 Other symptoms and signs involving the musculoskeletal system: Secondary | ICD-10-CM

## 2018-02-13 DIAGNOSIS — M6281 Muscle weakness (generalized): Secondary | ICD-10-CM

## 2018-02-13 DIAGNOSIS — G8929 Other chronic pain: Secondary | ICD-10-CM

## 2018-02-13 NOTE — Therapy (Addendum)
[place], [place], [address] Phone: [phone]   Fax:  [phone]   Progress Note Reporting Period 01/21/18 to 02/13/18  See note below for Objective Data and Assessment of Progress/Goals.   Geraldine Solar PT, DPT    Physical Therapy Treatment  Patient Details  Name: Zachary Murillo. MRN: [id number] Date of Birth: December 30, 1950 Referring Provider (PT): Robyne Askew, [place]   Encounter Date: 02/13/2018  PT End of Session - 02/13/18 0856    Visit Number  6    Number of Visits  13    Date for PT Re-Evaluation  03/04/18   Minireassess complete 02/13/2018   Authorization Type  [employer] Medicare    Authorization Time Period  01/21/18 to 03/04/18    Authorization - Visit Number  6    Authorization - Number of Visits  10    PT Start Time  0850    PT Stop Time  0938    PT Time Calculation (min)  48 min    Activity Tolerance  Patient tolerated treatment well    Behavior During Therapy  [facility] for tasks assessed/performed       Past Medical History:  Diagnosis Date  . Diabetes mellitus ([place])   . History of Roux-en-Y gastric bypass   . Hypertension   . Overweight(278.02)   . Sleep apnea   . Stroke [facility])     Past Surgical History:  Procedure Laterality Date  . BACK SURGERY    . CARDIAC CATHETERIZATION    . KNEE ARTHROSCOPY    . NASAL SEPTUM SURGERY      There were no vitals filed for this visit.  Subjective Assessment - 02/13/18 0853    Subjective  Pt stated he is feeling good today, no reports of pain.  Had a wonderful Thanksgiving in the [place] with family    Pertinent History  L4-S1 decompression and fusion in 2015    Patient Stated Goals  walk better and walk normal    Currently in Pain?  No/denies         [facility] PT Assessment - 02/13/18 0001      Assessment   Medical Diagnosis  spinal stenosis of lumbar region with radiculopathy    Referring Provider (PT)  Robyne Askew, PA-C    Onset Date/Surgical  Date  --   gradually worsened over last year; L4-S1 fusion 2015   Next MD Visit  unscheduled    Prior Therapy  yes years ago      Observation/Other Assessments   Focus on Therapeutic Outcomes (FOTO)   39% limited   was 45% limited      ROM / Strength   AROM / PROM / Strength  AROM;Strength      AROM   AROM Assessment Site  Lumbar    Lumbar Flexion  10% limited in lordosis   was 25% limited, in lordosis   Lumbar Extension  WFL    Lumbar - Right Side Bend  knee joint, dull pain R   was knee joint, sharp pain R   Lumbar - Left Side Bend  knee joint, no pain   was knee joint, no pain   Lumbar - Right Rotation  25% limited, no pain   was 50% limited, no pain   Lumbar - Left Rotation  25% limited, no pain   was 50% limited, no pain     Strength   Strength Assessment Site  Hip;Knee;Ankle    Right Hip  Flexion  4/5   was 4/   Right Hip Extension  4/5   was-/    Right Hip ABduction  4/5   was 4/5   Left Hip Flexion  4+/5   was 4/5   Left Hip Extension  4/5   was 4/5   Left Hip ABduction  4+/5   was 4/5   Right Knee Flexion  5/5    Right Knee Extension  5/5    Left Knee Flexion  5/5    Left Knee Extension  5/5    Right Ankle Dorsiflexion  --   was 4+/5   Left Ankle Dorsiflexion  --   was 4+/5     Flexibility   Hamstrings  90/90: R: 145deg; L: 143deg   90/90: R: 137deg; L: 135deg     Palpation   Spinal mobility  hypomobile    Palpation comment  hypomobile throuhgout bil lumbar paraspinals, QL, distal lats in lower back; palpation to ~L2-3 paraspinals/QL/lats recreated his same lower back pain      Ambulation/Gait   Ambulation Distance (Feet)  692 Feet   3MWT was 526   Gait velocity  1.17 m/x   was .88 m/s     Balance   Balance Assessed  Yes      Static Standing Balance   Static Standing - Balance Support  No upper extremity supported    Static Standing Balance -  Activities   Single Leg Stance - Right Leg;Single Leg Stance - Left Leg    Static Standing -  Comment/# of Minutes  SLS Rt : average 7, Lt average 21"      Standardized Balance Assessment   Standardized Balance Assessment  Five Times Sit to Stand    Five times sit to stand comments   13.36", no UE   was 18.7sec, chair, no UE                  [facility] Adult PT Treatment/Exercise - 02/13/18 0001      Ambulation/Gait   Assistive device  None    Gait Pattern  Step-through pattern;Decreased arm swing - right;Trendelenburg;Lateral trunk lean to right      Posture/Postural Control   Posture/Postural Control  Postural limitations    Postural Limitations  Rounded Shoulders;Forward head;Increased thoracic kyphosis;Increased lumbar lordosis      Exercises   Exercises  Lumbar      Lumbar Exercises: Standing   Functional Squats  10 reps    Forward Lunge  10 reps    Forward Lunge Limitations  4in step height    Other Standing Lumbar Exercises  Sidestep 2RT with RTB; tandem stance on foam 2x 30"    Other Standing Lumbar Exercises  vector stance 10" x 3 B       Lumbar Exercises: Quadruped   [place]  5 reps    Opposite Arm/Leg Raise  Right arm/Left leg;Left arm/Right leg;5 reps    Other Quadruped Lumbar Exercises  child pose x 3 30"               PT Short Term Goals - 01/21/18 5176      PT SHORT TERM GOAL #1   Title  Pt will have improved proximal hip strength to 4+/5 in order to maximize gait and reduce LBP.    Time  3    Period  Weeks    Status  New    Target Date  02/11/18      PT SHORT TERM  GOAL #2   Title  Pt will be able to perform bil SLS for 10 sec in order to demo improved core and hip strength.     Time  3    Period  Weeks    Status  New      PT SHORT TERM GOAL #3   Title  Pt will have improved 5xSTS t0 12sec or < to demo improved functional and BLE strength.     Time  3    Period  Weeks    Status  New      PT SHORT TERM GOAL #4   Title  Pt will have improved bil HS flexibility to 145deg in 90/90 position to reduce his LBP.    Time   3    Period  Weeks    Status  New        PT Long Term Goals - 01/21/18 2831      PT LONG TERM GOAL #1   Title  Pt will have improved proximal hip strength to 5/5 in order to further improve gait and reduce LBP with walking.    Time  6    Period  Weeks    Status  New    Target Date  03/04/18      PT LONG TERM GOAL #2   Title  Pt will be able to perform bil SLS for 20 sec in order to further demo improved core and hip strength and maximize his ability to walk on uneven ground.    Time  6    Period  Weeks    Status  New      PT LONG TERM GOAL #3   Title  Pt will have at least 178ft improvement in 3MWT with average gait speed of 1.51m/s or better in order to maximize community access and improved tolerance to WB.    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     Time  6    Period  Weeks    Status  New      PT LONG TERM GOAL #5   Title  Pt will report being able to walk for 2 miles or more without increases in LBP to demo improved core and functional strength and allow him to explore cities with greater ease when traveling.    Time  6    Period  Weeks    Status  New            Plan - 02/13/18 0951    Clinical Impression Statement  Session focus on lumbar stability, mobility and LE strengthening.  Added lunges for stability with min cueing required for form and noted instability with new task requiring some intermittent HHA.  Reviewed goals as well and pt progressing well.  Strength continues to be limited with improvements with Lt hip and abd, 4/5 for all other tested hip movements.  Improved lumbar mobility with no reports of sharp pain, does c/o achey pain wiht Rt sidebend and rotation stated he feels a pull.  Increased cadence with 3MWT to 1.17 m/s (was .88 m/s eval).  Pt continues  to also show balance deficits though is improving since initial eval.  Pt reports increased difficulty with incline/decline slope with gait and stairs.      Rehab Potential  Fair    PT Frequency  2x / week    PT Duration  6 weeks    PT Treatment/Interventions  ADLs/Self Care Home Management;Aquatic Therapy;Cryotherapy;Electrical Stimulation;Moist Heat;DME Instruction;Gait training;Stair training;Functional mobility training;Therapeutic activities;Therapeutic exercise;Balance training;Neuromuscular re-education;Patient/family education;Manual techniques;Scar mobilization;Passive range of motion;Dry needling;Taping    PT Next Visit Plan  Next session add slope gait and stair training.  Continue wiht lunges, sidestep and balance activities.  Lats stretches and functional strengthening (especially glut med),  Manual technqiues for lumbar mm restriciotns, Rt QL and paraspinals.    PT Home Exercise Plan  eval: SKTC, bridging; hamstring stretch, LTR, SLR flexion, sidelying clams with RTB; 01/30/18: STS and SLS       Patient will benefit from skilled therapeutic intervention in order to improve the following deficits and impairments:  Abnormal gait, Decreased activity tolerance, Decreased balance, Decreased endurance, Decreased range of motion, Decreased strength, Difficulty walking, Hypomobility, Increased fascial restricitons, Increased muscle spasms, Impaired flexibility, Improper body mechanics, Postural dysfunction, Obesity, Pain  Visit Diagnosis: Chronic right-sided low back pain without sciatica  Muscle weakness (generalized)  Other symptoms and signs involving the musculoskeletal system  Difficulty in walking, not elsewhere classified     Problem List Patient Active Problem List   Diagnosis Date Noted  . Type 2 diabetes mellitus with vascular disease ([place]) 01/30/2015  . Mixed hyperlipidemia 01/30/2015  . Essential hypertension, benign 01/30/2015  . Vitamin D deficiency 01/30/2015  .  Class 1 obesity due to excess calories with serious comorbidity and body mass index (BMI) of 32.0 to 32.9 in adult 01/30/2015  . DDD (degenerative disc disease), 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/2013  . Difficulty in walking(719.7) 01/25/2013  . CVA (cerebral infarction) 01/22/2013  . Lack of coordination 01/22/2013  . OA (osteoarthritis) of knee 09/05/2011  . Knee pain, right 09/05/2011  . KNEE, ARTHRITIS, DEGEN./OSTEO 01/30/2009  . SHOULDER PAIN 01/16/2009  . KNEE PAIN 01/16/2009  . IMPINGEMENT SYNDROME 01/16/2009  . TENDINITIS, LEFT KNEE 01/16/2009   Ihor Austin, LPTA; CBIS [phone]  Aldona Lento 02/13/2018, 1:12 PM  [place] [address] [place], [place], [address] Phone: [phone]   Fax:  [phone]  Name: Zachary Murillo. MRN: [id number] Date of Birth: [birth year]

## 2018-02-17 ENCOUNTER — Encounter (HOSPITAL_COMMUNITY): Payer: Self-pay

## 2018-02-17 ENCOUNTER — Ambulatory Visit (HOSPITAL_COMMUNITY): Payer: Medicare Other

## 2018-02-17 DIAGNOSIS — R29898 Other symptoms and signs involving the musculoskeletal system: Secondary | ICD-10-CM

## 2018-02-17 DIAGNOSIS — M6281 Muscle weakness (generalized): Secondary | ICD-10-CM

## 2018-02-17 DIAGNOSIS — M545 Low back pain, unspecified: Secondary | ICD-10-CM

## 2018-02-17 DIAGNOSIS — G8929 Other chronic pain: Secondary | ICD-10-CM

## 2018-02-17 DIAGNOSIS — R262 Difficulty in walking, not elsewhere classified: Secondary | ICD-10-CM

## 2018-02-17 NOTE — Therapy (Signed)
[place], [place], [address] Phone: [phone]   Fax:  [phone]  Physical Therapy Treatment  Patient Details  Name: Zachary Murillo. MRN: [id number] Date of Birth: April 20, 1950 Referring Provider (PT): Robyne Askew, [place]   Encounter Date: 02/17/2018  PT End of Session - 02/17/18 0907    Visit Number  7    Number of Visits  13    Date for PT Re-Evaluation  03/04/18   Minireassess 02/13/2018   Authorization Type  [employer] Medicare    Authorization Time Period  01/21/18 to 03/04/18    Authorization - Visit Number  7    Authorization - Number of Visits  10    PT Start Time  0902    PT Stop Time  0940    PT Time Calculation (min)  38 min    Equipment Utilized During Treatment  Gait belt    Activity Tolerance  Patient tolerated treatment well    Behavior During Therapy  WFL for tasks assessed/performed       Past Medical History:  Diagnosis Date  . Diabetes mellitus ([place])   . History of Roux-en-Y gastric bypass   . Hypertension   . Overweight(278.02)   . Sleep apnea   . Stroke [facility])     Past Surgical History:  Procedure Laterality Date  . BACK SURGERY    . CARDIAC CATHETERIZATION    . KNEE ARTHROSCOPY    . NASAL SEPTUM SURGERY      There were no vitals filed for this visit.  Subjective Assessment - 02/17/18 0906    Subjective  Pt stated he is doing good today, no reports of pain currently.  Has a good weekend spending wiht grandkids    Pertinent History  L4-S1 decompression and fusion in 2015    Patient Stated Goals  walk better and walk normal    Currently in Pain?  No/denies                       [facility] Adult PT Treatment/Exercise - 02/17/18 0001      Exercises   Exercises  Lumbar      Lumbar Exercises: Stretches   Other Lumbar Stretch Exercise  Child's pose 3x 30" (arms in ER)      Lumbar Exercises: Aerobic   Tread Mill  1.4 mph, elevation at 4%      Lumbar Exercises:  Standing   Functional Squats  15 reps    Functional Squats Limitations  front of chair    Forward Lunge  15 reps    Forward Lunge Limitations  4in step height    Other Standing Lumbar Exercises  Sidestep 2RT with RTB; tandem stance on foam 2x 30"; step up 6in step 1 HR 15x BLE; step down 6in 1 HR, 10x    Other Standing Lumbar Exercises  SLS Rt 18", Lt 46" max of 3; vector stance 3x 10"      Lumbar Exercises: Quadruped   Opposite Arm/Leg Raise  Right arm/Left leg;Left arm/Right leg;5 reps    Other Quadruped Lumbar Exercises  child pose x 3 30"               PT Short Term Goals - 02/13/18 1438      PT SHORT TERM GOAL #1   Title  Pt will have improved proximal hip strength to 4+/5 in order to maximize gait and reduce LBP.    Time  3  Period  Weeks    Status  On-going      PT SHORT TERM GOAL #2   Title  Pt will be able to perform bil SLS for 10 sec in order to demo improved core and hip strength.     Time  3    Period  Weeks    Status  On-going      PT SHORT TERM GOAL #3   Title  Pt will have improved 5xSTS t0 12sec or < to demo improved functional and BLE strength.     Time  3    Period  Weeks    Status  On-going      PT SHORT TERM GOAL #4   Title  Pt will have improved bil HS flexibility to 145deg in 90/90 position to reduce his LBP.    Time  3    Period  Weeks    Status  On-going        PT Long Term Goals - 02/13/18 1438      PT LONG TERM GOAL #1   Title  Pt will have improved proximal hip strength to 5/5 in order to further improve gait and reduce LBP with walking.    Time  6    Period  Weeks    Status  On-going      PT LONG TERM GOAL #2   Title  Pt will be able to perform bil SLS for 20 sec in order to further demo improved core and hip strength and maximize his ability to walk on uneven ground.    Time  6    Period  Weeks    Status  On-going      PT LONG TERM GOAL #3   Title  Pt will have at least 150ft improvement in 3MWT with average gait speed  of 1.24m/s or better in order to maximize community access and improved tolerance to WB.    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     Time  6    Period  Weeks    Status  On-going      PT LONG TERM GOAL #5   Title  Pt will report being able to walk for 2 miles or more without increases in LBP to demo improved core and functional strength and allow him to explore cities with greater ease when traveling.    Time  6    Period  Weeks    Status  On-going            Plan - 02/17/18 4540    Clinical Impression Statement  Added gait training with incline slope and step-up training for functional strengthening following reports of difficult tasks.  Cueing to improve equal stance and stride length with gait training.  Pt able to complete all exercises with no reports of pain, was limited by balance deficits during tandem and vector stance with LOB or HHA required though is improved average SLS    Rehab Potential  Fair    PT Frequency  2x / week    PT Duration  6 weeks    PT Treatment/Interventions  ADLs/Self Care Home Management;Aquatic Therapy;Cryotherapy;Electrical Stimulation;Moist Heat;DME Instruction;Gait training;Stair  training;Functional mobility training;Therapeutic activities;Therapeutic exercise;Balance training;Neuromuscular re-education;Patient/family education;Manual techniques;Scar mobilization;Passive range of motion;Dry needling;Taping    PT Next Visit Plan  Next session add decline slope gait and stair training.  Continue wiht lunges, sidestep and balance activities.  Lats stretches and functional strengthening (especially glut med),  Manual technqiues for lumbar mm restriciotns, Rt QL and paraspinals.    PT Home Exercise Plan  eval: SKTC, bridging; hamstring stretch, LTR, SLR  flexion, sidelying clams with RTB; 01/30/18: STS and SLS       Patient will benefit from skilled therapeutic intervention in order to improve the following deficits and impairments:  Abnormal gait, Decreased activity tolerance, Decreased balance, Decreased endurance, Decreased range of motion, Decreased strength, Difficulty walking, Hypomobility, Increased fascial restricitons, Increased muscle spasms, Impaired flexibility, Improper body mechanics, Postural dysfunction, Obesity, Pain  Visit Diagnosis: Chronic right-sided low back pain without sciatica  Muscle weakness (generalized)  Other symptoms and signs involving the musculoskeletal system  Difficulty in walking, not elsewhere classified     Problem List Patient Active Problem List   Diagnosis Date Noted  . Type 2 diabetes mellitus with vascular disease ([place]) 01/30/2015  . Mixed hyperlipidemia 01/30/2015  . Essential hypertension, benign 01/30/2015  . Vitamin D deficiency 01/30/2015  . Class 1 obesity due to excess calories with serious comorbidity and body mass index (BMI) of 32.0 to 32.9 in adult 01/30/2015  . DDD (degenerative disc disease), 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/2013  . Difficulty in walking(719.7) 01/25/2013  . CVA (cerebral infarction) 01/22/2013  . Lack of coordination 01/22/2013  . OA (osteoarthritis) of knee 09/05/2011  . Knee pain, right 09/05/2011  . KNEE, ARTHRITIS, DEGEN./OSTEO 01/30/2009  . SHOULDER PAIN 01/16/2009  . KNEE PAIN 01/16/2009  . IMPINGEMENT SYNDROME 01/16/2009  . TENDINITIS, LEFT KNEE 01/16/2009   Ihor Austin, LPTA; CBIS [phone]  Aldona Lento 02/17/2018, 9:44 AM  [place], [place], [address] Phone: [phone]   Fax:  [phone]  Name: Drevion Offord. MRN: [id number] Date of Birth: 1950/04/22

## 2018-02-20 ENCOUNTER — Ambulatory Visit (HOSPITAL_COMMUNITY): Payer: Medicare Other

## 2018-02-20 ENCOUNTER — Encounter (HOSPITAL_COMMUNITY): Payer: Self-pay

## 2018-02-20 DIAGNOSIS — M6281 Muscle weakness (generalized): Secondary | ICD-10-CM

## 2018-02-20 DIAGNOSIS — M545 Low back pain, unspecified: Secondary | ICD-10-CM

## 2018-02-20 DIAGNOSIS — R262 Difficulty in walking, not elsewhere classified: Secondary | ICD-10-CM

## 2018-02-20 DIAGNOSIS — R29898 Other symptoms and signs involving the musculoskeletal system: Secondary | ICD-10-CM

## 2018-02-20 DIAGNOSIS — G8929 Other chronic pain: Secondary | ICD-10-CM

## 2018-02-20 NOTE — Therapy (Signed)
[place], [place], [address] Phone: [phone]   Fax:  [phone]  Physical Therapy Treatment  Patient Details  Name: Zachary Murillo. MRN: [id number] Date of Birth: Nov 24, 1950 Referring Provider (PT): Robyne Askew, [place]   Encounter Date: 02/20/2018  PT End of Session - 02/20/18 0903    Visit Number  8    Number of Visits  13    Date for PT Re-Evaluation  03/04/18   MInireassess 02/13/2018   Authorization Type  [employer] Medicare    Authorization Time Period  01/21/18 to 03/04/18    Authorization - Visit Number  8    Authorization - Number of Visits  10    PT Start Time  0901    PT Stop Time  0947    PT Time Calculation (min)  46 min    Activity Tolerance  Patient tolerated treatment well    Behavior During Therapy  [facility] for tasks assessed/performed       Past Medical History:  Diagnosis Date  . Diabetes mellitus ([place])   . History of Roux-en-Y gastric bypass   . Hypertension   . Overweight(278.02)   . Sleep apnea   . Stroke [facility])     Past Surgical History:  Procedure Laterality Date  . BACK SURGERY    . CARDIAC CATHETERIZATION    . KNEE ARTHROSCOPY    . NASAL SEPTUM SURGERY      There were no vitals filed for this visit.  Subjective Assessment - 02/20/18 0851    Subjective  Pt stated he is doing good today, no reoprts of pain currently.      Pertinent History  L4-S1 decompression and fusion in 2015    Patient Stated Goals  walk better and walk normal    Currently in Pain?  No/denies                       [facility] Adult PT Treatment/Exercise - 02/20/18 0001      Lumbar Exercises: Standing   Heel Raises  20 reps    Heel Raises Limitations  slope    Functional Squats  20 reps    Functional Squats Limitations  front of chair    Forward Lunge  15 reps    Forward Lunge Limitations  4in step height minimal HHA    Other Standing Lumbar Exercises  Step up 8in step 15x BLE; step down  6in eccentric control 15x 1 HHA each      Manual Therapy   Manual Therapy  Soft tissue mobilization    Manual therapy comments  completed separate from all other skilled interventions    Soft tissue mobilization  scar massage, Rt QL and lumbar paraspinals          Balance Exercises - 02/20/18 0929      Balance Exercises: Standing   Tandem Stance  30 secs;4 reps   P[place]off with RTB 10x each direction (4 sets   SLS  5 reps   Rt 13", Lt 35" max   SLS with Vectors  3 reps;10 secs    Sidestepping  2 reps;Theraband   GTB         PT Short Term Goals - 02/13/18 1438      PT SHORT TERM GOAL #1   Title  Pt will have improved proximal hip strength to 4+/5 in order to maximize gait and reduce LBP.    Time  3  Period  Weeks    Status  On-going      PT SHORT TERM GOAL #2   Title  Pt will be able to perform bil SLS for 10 sec in order to demo improved core and hip strength.     Time  3    Period  Weeks    Status  On-going      PT SHORT TERM GOAL #3   Title  Pt will have improved 5xSTS t0 12sec or < to demo improved functional and BLE strength.     Time  3    Period  Weeks    Status  On-going      PT SHORT TERM GOAL #4   Title  Pt will have improved bil HS flexibility to 145deg in 90/90 position to reduce his LBP.    Time  3    Period  Weeks    Status  On-going        PT Long Term Goals - 02/13/18 1438      PT LONG TERM GOAL #1   Title  Pt will have improved proximal hip strength to 5/5 in order to further improve gait and reduce LBP with walking.    Time  6    Period  Weeks    Status  On-going      PT LONG TERM GOAL #2   Title  Pt will be able to perform bil SLS for 20 sec in order to further demo improved core and hip strength and maximize his ability to walk on uneven ground.    Time  6    Period  Weeks    Status  On-going      PT LONG TERM GOAL #3   Title  Pt will have at least 189ft improvement in 3MWT with average gait speed of 1.17m/s or better in order  to maximize community access and improved tolerance to WB.    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     Time  6    Period  Weeks    Status  On-going      PT LONG TERM GOAL #5   Title  Pt will report being able to walk for 2 miles or more without increases in LBP to demo improved core and functional strength and allow him to explore cities with greater ease when traveling.    Time  6    Period  Weeks    Status  On-going            Plan - 02/20/18 5035    Clinical Impression Statement  Began gait training on decline slope for eccentric control and progressed step-up training for functional strengthening.  Pt presents with improved mechanics with less cueing for increased stride length during gait training, able to increase cadence with retro gait.  Continued balalnce and hip stability exercises.  Added palvo during tandem stance for core strengthening with some difficulty wiht balance.  EOS with manual soft tissue mobilization to address restrictions Rt QL with noted reduction following manual.  No reports of pain, was limited by fatigue.      Rehab Potential  Fair    PT Frequency  2x / week    PT Duration  6 weeks    PT Treatment/Interventions  ADLs/Self Care Home Management;Aquatic Therapy;Cryotherapy;Electrical Stimulation;Moist Heat;DME Instruction;Gait training;Stair training;Functional mobility training;Therapeutic activities;Therapeutic exercise;Balance training;Neuromuscular re-education;Patient/family education;Manual techniques;Scar mobilization;Passive range of motion;Dry needling;Taping    PT Next Visit Plan  Continue with incline/decline slope gait and stair training.  Continue wiht lunges, sidestep and balance activities.  Lats stretches and functional strengthening  (especially glut med),  Manual technqiues for lumbar mm restriciotns, Rt QL and paraspinals.    PT Home Exercise Plan  eval: SKTC, bridging; hamstring stretch, LTR, SLR flexion, sidelying clams with RTB; 01/30/18: STS and SLS       Patient will benefit from skilled therapeutic intervention in order to improve the following deficits and impairments:  Abnormal gait, Decreased activity tolerance, Decreased balance, Decreased endurance, Decreased range of motion, Decreased strength, Difficulty walking, Hypomobility, Increased fascial restricitons, Increased muscle spasms, Impaired flexibility, Improper body mechanics, Postural dysfunction, Obesity, Pain  Visit Diagnosis: Chronic right-sided low back pain without sciatica  Muscle weakness (generalized)  Other symptoms and signs involving the musculoskeletal system  Difficulty in walking, not elsewhere classified     Problem List Patient Active Problem List   Diagnosis Date Noted  . Type 2 diabetes mellitus with vascular disease ([place]) 01/30/2015  . Mixed hyperlipidemia 01/30/2015  . Essential hypertension, benign 01/30/2015  . Vitamin D deficiency 01/30/2015  . Class 1 obesity due to excess calories with serious comorbidity and body mass index (BMI) of 32.0 to 32.9 in adult 01/30/2015  . DDD (degenerative disc disease), 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/2013  . Difficulty in walking(719.7) 01/25/2013  . CVA (cerebral infarction) 01/22/2013  . Lack of coordination 01/22/2013  . OA (osteoarthritis) of knee 09/05/2011  . Knee pain, right 09/05/2011  . KNEE, ARTHRITIS, DEGEN./OSTEO 01/30/2009  . SHOULDER PAIN 01/16/2009  . KNEE PAIN 01/16/2009  . IMPINGEMENT SYNDROME 01/16/2009  . TENDINITIS, LEFT KNEE 01/16/2009   Ihor Austin, LPTA; CBIS [phone]  Aldona Lento 02/20/2018, 12:23 PM  [place] [address] [place], [place], [address] Phone: [phone]   Fax:  [phone]  Name:  Zachary Murillo. MRN: [id number] Date of Birth: Feb 10, 1951

## 2018-02-24 ENCOUNTER — Encounter (HOSPITAL_COMMUNITY): Payer: Self-pay

## 2018-02-24 ENCOUNTER — Ambulatory Visit (HOSPITAL_COMMUNITY): Payer: Medicare Other

## 2018-02-24 DIAGNOSIS — G8929 Other chronic pain: Secondary | ICD-10-CM

## 2018-02-24 DIAGNOSIS — M545 Low back pain, unspecified: Secondary | ICD-10-CM

## 2018-02-24 DIAGNOSIS — M6281 Muscle weakness (generalized): Secondary | ICD-10-CM

## 2018-02-24 DIAGNOSIS — R262 Difficulty in walking, not elsewhere classified: Secondary | ICD-10-CM

## 2018-02-24 DIAGNOSIS — R29898 Other symptoms and signs involving the musculoskeletal system: Secondary | ICD-10-CM

## 2018-02-24 NOTE — Therapy (Signed)
[place], [place], [address] Phone: [phone]   Fax:  [phone]  Physical Therapy Treatment  Patient Details  Name: Zachary Murillo. MRN: [id number] Date of Birth: [birth year] Referring Provider (PT): Robyne Askew, [place]   Encounter Date: 02/24/2018  PT End of Session - 02/24/18 0908    Visit Number  9    Number of Visits  13    Date for PT Re-Evaluation  03/04/18   Minireassess 02/13/18   Authorization Type  [employer] Medicare    Authorization Time Period  01/21/18 to 03/04/18    Authorization - Visit Number  9    Authorization - Number of Visits  10    PT Start Time  0904    PT Stop Time  0946    PT Time Calculation (min)  42 min    Equipment Utilized During Treatment  Gait belt    Activity Tolerance  Patient tolerated treatment well    Behavior During Therapy  WFL for tasks assessed/performed       Past Medical History:  Diagnosis Date  . Diabetes mellitus ([place])   . History of Roux-en-Y gastric bypass   . Hypertension   . Overweight(278.02)   . Sleep apnea   . Stroke [facility])     Past Surgical History:  Procedure Laterality Date  . BACK SURGERY    . CARDIAC CATHETERIZATION    . KNEE ARTHROSCOPY    . NASAL SEPTUM SURGERY      There were no vitals filed for this visit.  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  No reports of pain today.      Pertinent History  L4-S1 decompression and fusion in 2015    Patient Stated Goals  walk better and walk normal    Currently in Pain?  No/denies         [facility] PT Assessment - 02/24/18 0001      Assessment   Medical Diagnosis  spinal stenosis of lumbar region with radiculopathy    Referring Provider (PT)  Robyne Askew, PA-C    Onset Date/Surgical Date  --   gradually worsened over last year; L4- S1 fusion 2015   Next MD Visit   unscheduled    Prior Therapy  yes years ago                   [facility] Adult PT Treatment/Exercise - 02/24/18 0001      Lumbar Exercises: Aerobic   Tread Mill  Down slope @ 1.4 mph, elevation at 6%, walking backwards at slope 5% 1.2 mph      Lumbar Exercises: Standing   Heel Raises  20 reps    Heel Raises Limitations  slope    Functional Squats  20 reps    Functional Squats Limitations  front of chair    Forward Lunge  15 reps    Forward Lunge Limitations  4in step height minimal HHA    Other Standing Lumbar Exercises  Step up 8in step 20x BLE (power up); step down 6in eccentric control 15x 1 HHA each          Balance Exercises - 02/24/18 0932      Balance Exercises: Standing   Tandem Stance  30 secs;4 reps   Palvo tandem stance on foam 10x each/ 4 sets  SLS  5 reps    SLS with Vectors  3 reps;10 secs    Sidestepping  2 reps;Theraband   GTB         PT Short Term Goals - 02/13/18 1438      PT SHORT TERM GOAL #1   Title  Pt will have improved proximal hip strength to 4+/5 in order to maximize gait and reduce LBP.    Time  3    Period  Weeks    Status  On-going      PT SHORT TERM GOAL #2   Title  Pt will be able to perform bil SLS for 10 sec in order to demo improved core and hip strength.     Time  3    Period  Weeks    Status  On-going      PT SHORT TERM GOAL #3   Title  Pt will have improved 5xSTS t0 12sec or < to demo improved functional and BLE strength.     Time  3    Period  Weeks    Status  On-going      PT SHORT TERM GOAL #4   Title  Pt will have improved bil HS flexibility to 145deg in 90/90 position to reduce his LBP.    Time  3    Period  Weeks    Status  On-going        PT Long Term Goals - 02/13/18 1438      PT LONG TERM GOAL #1   Title  Pt will have improved proximal hip strength to 5/5 in order to further improve gait and reduce LBP with walking.    Time  6    Period  Weeks    Status  On-going      PT LONG TERM GOAL #2    Title  Pt will be able to perform bil SLS for 20 sec in order to further demo improved core and hip strength and maximize his ability to walk on uneven ground.    Time  6    Period  Weeks    Status  On-going      PT LONG TERM GOAL #3   Title  Pt will have at least 161ft improvement in 3MWT with average gait speed of 1.64m/s or better in order to maximize community access and improved tolerance to WB.    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     Time  6    Period  Weeks    Status  On-going      PT LONG TERM GOAL #5   Title  Pt will report being able to walk for 2 miles or more without increases in LBP to demo improved core and functional strength and allow him to explore cities with greater ease when traveling.    Time  6    Period  Weeks    Status  On-going            Plan - 02/24/18 1506    Clinical Impression Statement  Continued session focus with functional strengthening.  Added reverse gait training for gluteal and hamstring strengthening as well as increased slope  as progressing following reports of difficult functional task, able to complete with improved gait mechanics since last sessoin.  Continued balance and hip stability exercises.  Pt continues to have difficulty with NBOS, SLS and dynamic surface activities.  No reports of pain through session, was limited by fatigue.      Rehab Potential  Fair    PT Frequency  2x / week    PT Duration  6 weeks    PT Treatment/Interventions  ADLs/Self Care Home Management;Aquatic Therapy;Cryotherapy;Electrical Stimulation;Moist Heat;DME Instruction;Gait training;Stair training;Functional mobility training;Therapeutic activities;Therapeutic exercise;Balance training;Neuromuscular re-education;Patient/family education;Manual  techniques;Scar mobilization;Passive range of motion;Dry needling;Taping    PT Next Visit Plan  Continue with incline/decline slope gait and stair training.  Continue wiht lunges, sidestep and balance activities.  Lats stretches and functional strengthening (especially glut med),  Manual technqiues for lumbar mm restriciotns, Rt QL and paraspinals.    PT Home Exercise Plan  eval: SKTC, bridging; hamstring stretch, LTR, SLR flexion, sidelying clams with RTB; 01/30/18: STS and SLS       Patient will benefit from skilled therapeutic intervention in order to improve the following deficits and impairments:  Abnormal gait, Decreased activity tolerance, Decreased balance, Decreased endurance, Decreased range of motion, Decreased strength, Difficulty walking, Hypomobility, Increased fascial restricitons, Increased muscle spasms, Impaired flexibility, Improper body mechanics, Postural dysfunction, Obesity, Pain  Visit Diagnosis: Chronic right-sided low back pain without sciatica  Muscle weakness (generalized)  Other symptoms and signs involving the musculoskeletal system  Difficulty in walking, not elsewhere classified     Problem List Patient Active Problem List   Diagnosis Date Noted  . Type 2 diabetes mellitus with vascular disease ([place]) 01/30/2015  . Mixed hyperlipidemia 01/30/2015  . Essential hypertension, benign 01/30/2015  . Vitamin D deficiency 01/30/2015  . Class 1 obesity due to excess calories with serious comorbidity and body mass index (BMI) of 32.0 to 32.9 in adult 01/30/2015  . DDD (degenerative disc disease), 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/2013  . Difficulty in walking(719.7) 01/25/2013  . CVA (cerebral infarction) 01/22/2013  . Lack of coordination 01/22/2013  . OA (osteoarthritis) of knee 09/05/2011  . Knee pain, right 09/05/2011  . KNEE, ARTHRITIS, DEGEN./OSTEO 01/30/2009  . SHOULDER PAIN 01/16/2009  . KNEE PAIN 01/16/2009  . IMPINGEMENT SYNDROME 01/16/2009  . TENDINITIS, LEFT  KNEE 01/16/2009   Ihor Austin, LPTA; CBIS [phone] Aldona Lento 02/24/2018, 3:10 PM  [place] [address] [place], [place], [address] Phone: [phone]   Fax:  [phone]  Name: Zachary Murillo. MRN: [id number] Date of Birth: 01/15/1951

## 2018-02-27 ENCOUNTER — Ambulatory Visit (HOSPITAL_COMMUNITY): Payer: Medicare Other

## 2018-02-27 ENCOUNTER — Encounter (HOSPITAL_COMMUNITY): Payer: Self-pay

## 2018-02-27 DIAGNOSIS — M545 Low back pain, unspecified: Secondary | ICD-10-CM

## 2018-02-27 DIAGNOSIS — M6281 Muscle weakness (generalized): Secondary | ICD-10-CM

## 2018-02-27 DIAGNOSIS — R29898 Other symptoms and signs involving the musculoskeletal system: Secondary | ICD-10-CM

## 2018-02-27 DIAGNOSIS — G8929 Other chronic pain: Secondary | ICD-10-CM

## 2018-02-27 DIAGNOSIS — R262 Difficulty in walking, not elsewhere classified: Secondary | ICD-10-CM

## 2018-02-27 NOTE — Therapy (Signed)
[place], [place], [address] Phone: [phone]   Fax:  [phone]  Physical Therapy Treatment  Patient Details  Name: Zachary Murillo. MRN: [id number] Date of Birth: 1950-06-10 Referring Provider (PT): Robyne Askew, [place]   Encounter Date: 02/27/2018  PT End of Session - 02/27/18 0917    Visit Number  10    Number of Visits  13    Date for PT Re-Evaluation  03/04/18   Minireassess 02/13/2018   Authorization Type  [employer]HC Medicare    Authorization Time Period  01/21/18 to 03/04/18    Authorization - Visit Number  10    Authorization - Number of Visits  20    PT Start Time  0903    PT Stop Time  0946    PT Time Calculation (min)  43 min    Equipment Utilized During Treatment  Gait belt    Activity Tolerance  Patient tolerated treatment well    Behavior During Therapy  WFL for tasks assessed/performed       Past Medical History:  Diagnosis Date  . Diabetes mellitus ([place])   . History of Roux-en-Y gastric bypass   . Hypertension   . Overweight(278.02)   . Sleep apnea   . Stroke [facility])     Past Surgical History:  Procedure Laterality Date  . BACK SURGERY    . CARDIAC CATHETERIZATION    . KNEE ARTHROSCOPY    . NASAL SEPTUM SURGERY      There were no vitals filed for this visit.  Subjective Assessment - 02/27/18 0909    Subjective  Pt stated he is feeling good today, no reports of pain.  Reports he is making improvements walking down slopes, continues to have difficulty with balance.      Pertinent History  L4-S1 decompression and fusion in 2015    Patient Stated Goals  walk better and walk normal    Currently in Pain?  No/denies         [facility] PT Assessment - 02/27/18 0001      Assessment   Medical Diagnosis  spinal stenosis of lumbar region with radiculopathy    Referring Provider (PT)  Robyne Askew, PA-C    Onset Date/Surgical Date  --   gradually worsened over last year; L4- S1 fusion 2015   Next MD Visit  unscheduled    Prior Therapy  yes years ago      Observation/Other Assessments   Focus on Therapeutic Outcomes (FOTO)   42% limited    was 39% limited 02/13/2018                  [place] Adult PT Treatment/Exercise - 02/27/18 0001      Lumbar Exercises: Aerobic   Tread Mill  elevated at 7% 4' forward as well as 4' walking down slope at 1.6 mph      Lumbar Exercises: Standing   Heel Raises  20 reps    Heel Raises Limitations  slope    Functional Squats  20 reps    Functional Squats Limitations  front of chair    Forward Lunge  15 reps    Forward Lunge Limitations  floor, cueing for mechanics    Other Standing Lumbar Exercises  7in step height reciprocal pattern 5RT 1 HR descending    Other Standing Lumbar Exercises  toe tapping x 5; vector stance BLE 3x 10"          Balance Exercises -  02/27/18 1100      Balance Exercises: Standing   SLS  5 reps       02/27/18 1114  Balance Exercises: Standing  Tandem Stance 4 reps;30 secs (Palvo RTB 15x each)  SLS 5 reps  SLS with Vectors 3 reps;10 secs  Sidestepping  (HEP)        PT Short Term Goals - 02/13/18 1438      PT SHORT TERM GOAL #1   Title  Pt will have improved proximal hip strength to 4+/5 in order to maximize gait and reduce LBP.    Time  3    Period  Weeks    Status  On-going      PT SHORT TERM GOAL #2   Title  Pt will be able to perform bil SLS for 10 sec in order to demo improved core and hip strength.     Time  3    Period  Weeks    Status  On-going      PT SHORT TERM GOAL #3   Title  Pt will have improved 5xSTS t0 12sec or < to demo improved functional and BLE strength.     Time  3    Period  Weeks    Status  On-going      PT SHORT TERM GOAL #4   Title  Pt will have improved bil HS flexibility to 145deg in 90/90 position to reduce his LBP.    Time  3    Period  Weeks    Status  On-going        PT Long Term Goals - 02/13/18 1438      PT LONG TERM GOAL #1   Title   Pt will have improved proximal hip strength to 5/5 in order to further improve gait and reduce LBP with walking.    Time  6    Period  Weeks    Status  On-going      PT LONG TERM GOAL #2   Title  Pt will be able to perform bil SLS for 20 sec in order to further demo improved core and hip strength and maximize his ability to walk on uneven ground.    Time  6    Period  Weeks    Status  On-going      PT LONG TERM GOAL #3   Title  Pt will have at least 112ft improvement in 3MWT with average gait speed of 1.90m/s or better in order to maximize community access and improved tolerance to WB.    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     Time  6    Period  Weeks    Status  On-going      PT LONG TERM GOAL #5   Title  Pt will report being able to walk for 2 miles or more without increases in LBP to demo improved core and functional strength and allow him to explore cities with greater ease when traveling.    Time  6    Period  Weeks    Status  On-going            Plan - 02/27/18 1103  Clinical Impression Statement  Increased elevation for challenge with walking up and down hills, noted improved gait mechanics as well as ease with task.  Continued therex focus on hip strengthening and balance training.  Reciprocal stairs complete wiht 1 HR 7 in step height, pt reports increased confidence wiht tasks as long as HR available.  Added cone taps for SLS with min difficulty.      Rehab Potential  Fair    PT Frequency  2x / week    PT Duration  6 weeks    PT Treatment/Interventions  ADLs/Self Care Home Management;Aquatic Therapy;Cryotherapy;Electrical Stimulation;Moist Heat;DME Instruction;Gait training;Stair training;Functional mobility training;Therapeutic activities;Therapeutic  exercise;Balance training;Neuromuscular re-education;Patient/family education;Manual techniques;Scar mobilization;Passive range of motion;Dry needling;Taping    PT Next Visit Plan  Review goals next sessoin.  Therex focus on hip strengthening and balalnce activities.  Manual techniques for lumbar restrictions PRN.      PT Home Exercise Plan  eval: SKTC, bridging; hamstring stretch, LTR, SLR flexion, sidelying clams with RTB; 01/30/18: STS and SLS       Patient will benefit from skilled therapeutic intervention in order to improve the following deficits and impairments:  Abnormal gait, Decreased activity tolerance, Decreased balance, Decreased endurance, Decreased range of motion, Decreased strength, Difficulty walking, Hypomobility, Increased fascial restricitons, Increased muscle spasms, Impaired flexibility, Improper body mechanics, Postural dysfunction, Obesity, Pain  Visit Diagnosis: Chronic right-sided low back pain without sciatica  Muscle weakness (generalized)  Other symptoms and signs involving the musculoskeletal system  Difficulty in walking, not elsewhere classified     Problem List Patient Active Problem List   Diagnosis Date Noted  . Type 2 diabetes mellitus with vascular disease ([place]) 01/30/2015  . Mixed hyperlipidemia 01/30/2015  . Essential hypertension, benign 01/30/2015  . Vitamin D deficiency 01/30/2015  . Class 1 obesity due to excess calories with serious comorbidity and body mass index (BMI) of 32.0 to 32.9 in adult 01/30/2015  . DDD (degenerative disc disease), 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/2013  . Difficulty in walking(719.7) 01/25/2013  . CVA (cerebral infarction) 01/22/2013  . Lack of coordination 01/22/2013  . OA (osteoarthritis) of knee 09/05/2011  . Knee pain, right 09/05/2011  . KNEE, ARTHRITIS, DEGEN./OSTEO 01/30/2009  . SHOULDER PAIN 01/16/2009  . KNEE PAIN 01/16/2009  . IMPINGEMENT SYNDROME 01/16/2009  . TENDINITIS, LEFT KNEE 01/16/2009   Ihor Austin,  LPTA; CBIS [phone]  Aldona Lento 02/27/2018, 11:15 AM  [place] [address] [place], [place], [address] Phone: [phone]   Fax:  [phone]  Name: Zachary Murillo. MRN: [id number] Date of Birth: 26-Nov-1950

## 2018-03-03 ENCOUNTER — Encounter (HOSPITAL_COMMUNITY): Payer: Self-pay

## 2018-03-03 ENCOUNTER — Ambulatory Visit (HOSPITAL_COMMUNITY): Payer: Medicare Other

## 2018-03-03 DIAGNOSIS — M545 Low back pain, unspecified: Secondary | ICD-10-CM

## 2018-03-03 DIAGNOSIS — R262 Difficulty in walking, not elsewhere classified: Secondary | ICD-10-CM

## 2018-03-03 DIAGNOSIS — M6281 Muscle weakness (generalized): Secondary | ICD-10-CM

## 2018-03-03 DIAGNOSIS — R29898 Other symptoms and signs involving the musculoskeletal system: Secondary | ICD-10-CM

## 2018-03-03 DIAGNOSIS — G8929 Other chronic pain: Secondary | ICD-10-CM

## 2018-03-03 NOTE — Patient Instructions (Signed)
Access Code: [email or web address]  URL: [email or web address]  Date: 03/03/2018  Prepared by: Geraldine Solar   Exercises Step Up - 10 reps - 3 sets - 1x daily - 7x weekly Lateral Step Ups - 10 reps - 3 sets - 1x daily - 7x weekly Mini Squat with Chair - 10 reps - 3 sets - 1x daily - 7x weekly Hip Abduction with Resistance Loop - 10 reps - 3 sets - 1x daily - 7x weekly Side Stepping with Resistance at Ankles - 10 reps - 3 sets - 1x daily - 7x weekly Single Leg Stance - 10 reps - 3 sets - 1x daily - 7x weekly Standing 3-Way Kick - 10 reps - 3 sets - 1x daily - 7x weekly Tandem Stance in Corner - 10 reps - 3 sets - as long as you can hold - 1x daily - 7x weekly Tandem Stance with Eyes Closed in Corner - 10 reps - 3 sets - 1x daily - 7x weekly

## 2018-03-03 NOTE — Therapy (Signed)
[facility] [address], [place], [address] Phone: [phone]   Fax:  [phone]   PHYSICAL THERAPY DISCHARGE SUMMARY  Visits from Start of Care: 11  Current functional level related to goals / functional outcomes: See below   Remaining deficits: See below   Education / Equipment: HEP  Plan: Patient agrees to discharge.  Patient goals were partially met. Patient is being discharged due to meeting the stated rehab goals.  ?????     Physical Therapy Treatment  Patient Details  Name: Zachary Murillo. MRN: [id number] Date of Birth: 16-Jan-1951 Referring Provider (PT): Robyne Askew, [place]   Encounter Date: 03/03/2018  PT End of Session - 03/03/18 0805    Visit Number  11    Number of Visits  13    Date for PT Re-Evaluation  03/04/18   Minireassess 02/13/2018   Authorization Type  [employer] Medicare    Authorization Time Period  01/21/18 to 03/04/18    Authorization - Visit Number  11    Authorization - Number of Visits  20    PT Start Time  0815    PT Stop Time  0786    PT Time Calculation (min)  29 min    Equipment Utilized During Treatment  Gait belt    Activity Tolerance  Patient tolerated treatment well    Behavior During Therapy  WFL for tasks assessed/performed       Past Medical History:  Diagnosis Date  . Diabetes mellitus ([place])   . History of Roux-en-Y gastric bypass   . Hypertension   . Overweight(278.02)   . Sleep apnea   . Stroke [facility])     Past Surgical History:  Procedure Laterality Date  . BACK SURGERY    . CARDIAC CATHETERIZATION    . KNEE ARTHROSCOPY    . NASAL SEPTUM SURGERY      There were no vitals filed for this visit.  Subjective Assessment - 03/03/18 0810    Subjective  Pt states that he's feeling pretty good. 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     Pertinent History  L4-S1 decompression and fusion in 2015    How long can you  sit comfortably?  4 hours (was <1 hour)    Patient Stated Goals  walk better and walk normal    Currently in Pain?  No/denies         [facility] PT Assessment - 03/03/18 0001      Assessment   Medical Diagnosis  spinal stenosis of lumbar region with radiculopathy    Referring Provider (PT)  Robyne Askew, PA-C    Onset Date/Surgical Date  --   gradually worsened over last year; L4- S1 fusion 2015   Next MD Visit  unscheduled    Prior Therapy  yes years ago      Strength   Right Hip Flexion  5/5   was 4   Right Hip Extension  4+/5   was 4   Right Hip ABduction  4+/5   was 4   Left Hip Flexion  5/5   was 4+   Left Hip Extension  5/5   was 4   Left Hip ABduction  4+/5   was 4+     Flexibility   Hamstrings  L: 145   L was 143deg     Ambulation/Gait   Ambulation Distance (Feet)  694 Feet  3MWT; was 548f   Assistive device  None    Gait Pattern  Within Functional Limits    Gait velocity  1.253m   was 1.1757m    Balance   Balance Assessed  Yes      Static Standing Balance   Static Standing - Balance Support  No upper extremity supported    Static Standing Balance -  Activities   Single Leg Stance - Right Leg;Single Leg Stance - Left Leg    Static Standing - Comment/# of Minutes  R: 16.6sec or < L: 19sec or <   was 5sec R, 6sec L     Standardized Balance Assessment   Standardized Balance Assessment  Five Times Sit to Stand    Five times sit to stand comments   11.2sec, chair, no UE   was 13.3sec, no UE        PT Education - 03/03/18 0809    Education Details  Reassessment findings, discharge HEP    Person(s) Educated  Patient    Methods  Explanation;Demonstration    Comprehension  Verbalized understanding;Returned demonstration       PT Short Term Goals - 03/03/18 0817      PT SHORT TERM GOAL #1   Title  Pt will have improved proximal hip strength to 4+/5 in order to maximize gait and reduce LBP.    Baseline  12/24: see MMT    Time  3    Period  Weeks     Status  Achieved      PT SHORT TERM GOAL #2   Title  Pt will be able to perform bil SLS for 10 sec in order to demo improved core and hip strength.     Baseline  12/24: R: 16sec, L: 19sec    Time  3    Period  Weeks    Status  Achieved      PT SHORT TERM GOAL #3   Title  Pt will have improved 5xSTS to 12sec or < to demo improved functional and BLE strength.     Baseline  12/24: 11.2sec    Time  3    Period  Weeks    Status  Achieved      PT SHORT TERM GOAL #4   Title  Pt will have improved bil HS flexibility to 145deg in 90/90 position to reduce his LBP.    Baseline  12/24: BLE 145deg    Time  3    Period  Weeks    Status  Achieved        PT Long Term Goals - 03/03/18 0818      PT LONG TERM GOAL #1   Title  Pt will have improved proximal hip strength to 5/5 in order to further improve gait and reduce LBP with walking.    Baseline  12/24: see MMT    Time  6    Period  Weeks    Status  Partially Met      PT LONG TERM GOAL #2   Title  Pt will be able to perform bil SLS for 20 sec in order to further demo improved core and hip strength and maximize his ability to walk on uneven ground.    Baseline  12/24: R: 16sec or <, L: 19sec or <    Time  6    Period  Weeks    Status  Partially Met      PT LONG TERM GOAL #3   Title  Pt will have at least 142f improvement in 3MWT with average gait speed of 1.049m or better in order to maximize community access and improved tolerance to WB.    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     Baseline  12/24: averages 4 hours of sitting before pain increases    Time  6    Period  Weeks    Status  Achieved      PT LONG TERM GOAL #5   Title  Pt will report being able to walk for 2 miles or more without increases in LBP to demo improved  core and functional strength and allow him to explore cities with greater ease when traveling.    Baseline  12/24: a good 1 mile, maybe a little over    Time  6    Period  Weeks    Status  Partially Met            Plan - 03/03/18 0845    Clinical Impression Statement  PT reassessed pt's goals and outcome measures this date. Pt has made tremendous progress towards goals as illustrated above. He has me or partially met all STG and LTG. His main limitation is sitting for long periods of time, however, his tolerance to sitting has improved to 4 hours whereas at his eval it was <1 hour. PT provided pt with extensive HEP update to continue his gains made in therapy. Pt discharged to his HEP at this time due to progress made.     Rehab Potential  Fair    PT Frequency  2x / week    PT Duration  6 weeks    PT Treatment/Interventions  ADLs/Self Care Home Management;Aquatic Therapy;Cryotherapy;Electrical Stimulation;Moist Heat;DME Instruction;Gait training;Stair training;Functional mobility training;Therapeutic activities;Therapeutic exercise;Balance training;Neuromuscular re-education;Patient/family education;Manual techniques;Scar mobilization;Passive range of motion;Dry needling;Taping    PT Next Visit Plan  d/c to HEP    PT Home Exercise Plan  eval: SKTC, bridging; hamstring stretch, LTR, SLR flexion, sidelying clams with RTB; 01/30/18: STS and SLS; 12/24: see below for extensive additions    Consulted and Agree with Plan of Care  Patient       Patient will benefit from skilled therapeutic intervention in order to improve the following deficits and impairments:  Abnormal gait, Decreased activity tolerance, Decreased balance, Decreased endurance, Decreased range of motion, Decreased strength, Difficulty walking, Hypomobility, Increased fascial restricitons, Increased muscle spasms, Impaired flexibility, Improper body mechanics, Postural dysfunction, Obesity, Pain  Visit Diagnosis: Chronic  right-sided low back pain without sciatica  Muscle weakness (generalized)  Other symptoms and signs involving the musculoskeletal system  Difficulty in walking, not elsewhere classified     Problem List Patient Active Problem List   Diagnosis Date Noted  . Type 2 diabetes mellitus with vascular disease ([facility]11/21/2016  . Mixed hyperlipidemia 01/30/2015  . Essential hypertension, benign 01/30/2015  . Vitamin D deficiency 01/30/2015  . Class 1 obesity due to excess calories with serious comorbidity and body mass index (BMI) of 32.0 to 32.9 in adult 01/30/2015  . DDD (degenerative disc disease), 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/2013  . Difficulty in walking(719.7) 01/25/2013  . CVA (cerebral infarction) 01/22/2013  . Lack of coordination 01/22/2013  .  OA (osteoarthritis) of knee 09/05/2011  . Knee pain, right 09/05/2011  . KNEE, ARTHRITIS, DEGEN./OSTEO 01/30/2009  . SHOULDER PAIN 01/16/2009  . KNEE PAIN 01/16/2009  . IMPINGEMENT SYNDROME 01/16/2009  . TENDINITIS, LEFT KNEE 01/16/2009       Geraldine Solar PT, DPT  [facility] [address] [place], [place], [address] Phone: [phone]   Fax:  [phone]  Name: Ikaika Showers. MRN: [id number] Date of Birth: 1950-07-07

## 2018-03-06 ENCOUNTER — Ambulatory Visit (HOSPITAL_COMMUNITY): Payer: Medicare Other

## 2018-03-21 ENCOUNTER — Other Ambulatory Visit: Payer: Self-pay | Admitting: "Endocrinology

## 2018-04-21 ENCOUNTER — Other Ambulatory Visit: Payer: Self-pay | Admitting: "Endocrinology

## 2018-04-22 LAB — TSH: TSH: 2.11 u[IU]/mL (ref 0.450–4.500)

## 2018-04-22 LAB — MICROALBUMIN / CREATININE URINE RATIO
Creatinine, Urine: 155.5 mg/dL
Microalb/Creat Ratio: 8 mg/g creat (ref 0–29)
Microalbumin, Urine: 11.9 ug/mL

## 2018-04-22 LAB — COMPREHENSIVE METABOLIC PANEL
ALK PHOS: 130 IU/L — AB (ref 39–117)
ALT: 21 IU/L (ref 0–44)
AST: 21 IU/L (ref 0–40)
Albumin/Globulin Ratio: 1.9 (ref 1.2–2.2)
Albumin: 4.5 g/dL (ref 3.8–4.8)
BILIRUBIN TOTAL: 0.7 mg/dL (ref 0.0–1.2)
BUN/Creatinine Ratio: 24 (ref 10–24)
BUN: 22 mg/dL (ref 8–27)
CHLORIDE: 99 mmol/L (ref 96–106)
CO2: 28 mmol/L (ref 20–29)
CREATININE: 0.93 mg/dL (ref 0.76–1.27)
Calcium: 9.6 mg/dL (ref 8.6–10.2)
GFR calc Af Amer: 97 mL/min/{1.73_m2} (ref >59)
GFR calc non Af Amer: 84 mL/min/{1.73_m2} (ref >59)
GLUCOSE: 165 mg/dL — AB (ref 65–99)
Globulin, Total: 2.4 g/dL (ref 1.5–4.5)
Potassium: 4 mmol/L (ref 3.5–5.2)
Sodium: 145 mmol/L — ABNORMAL HIGH (ref 134–144)
Total Protein: 6.9 g/dL (ref 6.0–8.5)

## 2018-04-22 LAB — LIPID PANEL W/O CHOL/HDL RATIO
CHOLESTEROL TOTAL: 111 mg/dL (ref 100–199)
HDL: 35 mg/dL — ABNORMAL LOW (ref >39)
LDL CALC: 58 mg/dL (ref 0–99)
TRIGLYCERIDES: 92 mg/dL (ref 0–149)
VLDL CHOLESTEROL CAL: 18 mg/dL (ref 5–40)

## 2018-04-22 LAB — VITAMIN B12: Vitamin B-12: >2000 pg/mL — ABNORMAL HIGH (ref 232–1245)

## 2018-04-22 LAB — SPECIMEN STATUS REPORT

## 2018-04-22 LAB — T4, FREE: Free T4: 1.34 ng/dL (ref 0.82–1.77)

## 2018-04-22 LAB — HGB A1C W/O EAG: Hgb A1c MFr Bld: 7.7 % — ABNORMAL HIGH (ref 4.8–5.6)

## 2018-04-22 LAB — VITAMIN D 25 HYDROXY (VIT D DEFICIENCY, FRACTURES): Vit D, 25-Hydroxy: 39.6 ng/mL (ref 30.0–100.0)

## 2018-04-30 ENCOUNTER — Ambulatory Visit: Payer: Medicare Other | Admitting: "Endocrinology

## 2018-04-30 ENCOUNTER — Encounter: Payer: Self-pay | Admitting: "Endocrinology

## 2018-04-30 VITALS — BP 127/81 | HR 67 | Ht 69.0 in | Wt 237.0 lb

## 2018-04-30 DIAGNOSIS — E1159 Type 2 diabetes mellitus with other circulatory complications: Secondary | ICD-10-CM | POA: Diagnosis not present

## 2018-04-30 DIAGNOSIS — E782 Mixed hyperlipidemia: Secondary | ICD-10-CM | POA: Diagnosis not present

## 2018-04-30 DIAGNOSIS — I10 Essential (primary) hypertension: Secondary | ICD-10-CM

## 2018-04-30 MED ORDER — DULAGLUTIDE 1.5 MG/0.5ML ~~LOC~~ SOAJ: 1.5000 mg | SUBCUTANEOUS | 3 refills | Status: DC

## 2018-04-30 NOTE — Progress Notes (Signed)
Endocrinology follow-up note   Subjective:    Patient ID: Zachary Murillo., male    DOB: 1951-02-17, PCP Sinda Du, MD   Past Medical History:  Diagnosis Date  . Diabetes mellitus ([place])   . History of Roux-en-Y gastric bypass   . Hypertension   . Overweight(278.02)   . Sleep apnea   . Stroke [facility])    Past Surgical History:  Procedure Laterality Date  . BACK SURGERY    . CARDIAC CATHETERIZATION    . KNEE ARTHROSCOPY    . NASAL SEPTUM SURGERY     Social History   Socioeconomic History  . Marital status: Married    Spouse name: Not on file  . Number of children: Not on file  . Years of education: Masters  . Highest education level: Not on file  Occupational History  . Not on file  Social Needs  . Financial resource strain: Not on file  . Food insecurity:    Worry: Not on file    Inability: Not on file  . Transportation needs:    Medical: Not on file    Non-medical: Not on file  Tobacco Use  . Smoking status: Never Smoker  . Smokeless tobacco: Never Used  Substance and Sexual Activity  . Alcohol use: Yes    Comment: occ  . Drug use: No  . Sexual activity: Not Currently  Lifestyle  . Physical activity:    Days per week: Not on file    Minutes per session: Not on file  . Stress: Not on file  Relationships  . Social connections:    Talks on phone: Not on file    Gets together: Not on file    Attends religious service: Not on file    Active member of club or organization: Not on file    Attends meetings of clubs or organizations: Not on file    Relationship status: Not on file  Other Topics Concern  . Not on file  Social History Narrative  . Not on file   Outpatient Encounter Medications as of 04/30/2018  Medication Sig  . amLODipine (NORVASC) 10 MG tablet Take 10 mg by mouth daily.  Marland Kitchen aspirin EC 81 MG tablet Take 81 mg by mouth once.   Marland Kitchen atorvastatin (LIPITOR) 10 MG tablet TAKE (1) TABLET BY MOUTH AT BEDTIME.  . Calcium Citrate-Vitamin D  (CALCIUM + D PO) Take by mouth 2 (two) times daily.  . carvedilol (COREG) 25 MG tablet Take 12.5 mg by mouth 2 (two) times daily with a meal.   . cetirizine (ZYRTEC) 10 MG tablet Take 10 mg by mouth daily as needed for allergies.   . chlorthalidone (HYGROTON) 25 MG tablet Take 12.5 mg by mouth daily.   . Cholecalciferol (VITAMIN D) 2000 units CAPS Take by mouth.  . docusate sodium (COLACE) 100 MG capsule Take 100 mg by mouth 2 (two) times daily.  . Dulaglutide (TRULICITY) 1.5 QI/2.9NL SOPN Inject 1.5 mg into the skin once a week.  Marland Kitchen lisinopril (PRINIVIL,ZESTRIL) 40 MG tablet Take 40 mg by mouth every morning.  . metFORMIN (GLUCOPHAGE) 1000 MG tablet TAKE 1 TABLET BY MOUTH TWICE DAILY WITH A MEAL.  . Multiple Vitamins-Minerals (CENTRUM PO) Take 1 tablet by mouth every morning.  . sertraline (ZOLOFT) 50 MG tablet Take 1 tablet (50 mg total) by mouth daily.  . [DISCONTINUED] Cyanocobalamin 500 MCG/0.1ML SOLN Place into the nose daily as needed.   . [DISCONTINUED] eplerenone (INSPRA) 50 MG tablet Take  150 mg by mouth 2 (two) times daily.   . [DISCONTINUED] vitamin B-12 (CYANOCOBALAMIN) 1000 MCG tablet Take 1,000 mcg by mouth every morning.   No facility-administered encounter medications on file as of 04/30/2018.    ALLERGIES: Allergies  Allergen Reactions  . Prednisone     Starts itching   VACCINATION STATUS:  There is no immunization history on file for this patient.  Diabetes  He presents for his follow-up diabetic visit. He has type 2 diabetes mellitus. Onset time: He was diagnosed at approximate age of 55 years. His disease course has been worsening. There are no hypoglycemic associated symptoms. Pertinent negatives for hypoglycemia include no confusion, headaches, pallor or seizures. Pertinent negatives for diabetes include no chest pain, no fatigue, no polydipsia, no polyphagia, no polyuria and no weakness. There are no hypoglycemic complications. Symptoms are worsening. Diabetic  complications include a CVA. Risk factors for coronary artery disease include diabetes mellitus, dyslipidemia, hypertension, male sex, obesity and sedentary lifestyle. Current diabetic treatment includes oral agent (dual therapy) and oral agent (monotherapy). He is compliant with treatment all of the time. His weight is increasing steadily (He is regaining after he lost 124 pounds status post gastric bypass). He is following a generally unhealthy diet. He has had a previous visit with a dietitian. He participates in exercise intermittently. An ACE inhibitor/angiotensin II receptor blocker is being taken. Eye exam is current.  Hyperlipidemia  This is a chronic problem. The current episode started more than 1 year ago. Pertinent negatives include no chest pain, myalgias or shortness of breath. Current antihyperlipidemic treatment includes statins. Risk factors for coronary artery disease include diabetes mellitus, dyslipidemia, hypertension, male sex, a sedentary lifestyle, obesity and family history.  Hypertension  This is a chronic problem. The current episode started more than 1 year ago. The problem is uncontrolled. Pertinent negatives include no chest pain, headaches, neck pain, palpitations or shortness of breath. Risk factors for coronary artery disease include diabetes mellitus, dyslipidemia, male gender and obesity. Hypertensive end-organ damage includes CVA. Identifiable causes of hypertension include hyperaldosteronism.    Review of Systems  Constitutional: Negative for fatigue and unexpected weight change.       He lost total of 124 pounds.   HENT: Negative for dental problem, mouth sores and trouble swallowing.   Eyes: Negative for visual disturbance.  Respiratory: Negative for cough, choking, chest tightness, shortness of breath and wheezing.   Cardiovascular: Negative for chest pain, palpitations and leg swelling.  Gastrointestinal: Negative for abdominal distention, abdominal pain,  constipation, diarrhea, nausea and vomiting.  Endocrine: Negative for polydipsia, polyphagia and polyuria.  Genitourinary: Negative for dysuria, flank pain, hematuria and urgency.  Musculoskeletal: Negative for back pain, gait problem, myalgias and neck pain.  Skin: Negative for pallor, rash and wound.  Neurological: Negative for seizures, syncope, weakness, numbness and headaches.  Psychiatric/Behavioral: Negative.  Negative for confusion and dysphoric mood.    Objective:    BP 127/81   Pulse 67   Ht 5\' 9"  (1.753 m)   Wt 237 lb (107.5 kg)   BMI 35.00 kg/m   Wt Readings from Last 3 Encounters:  04/30/18 237 lb (107.5 kg)  12/01/17 234 lb (106.1 kg)  10/28/17 228 lb (103.4 kg)    Physical Exam  Constitutional: He is oriented to person, place, and time. He appears well-developed and well-nourished. He is cooperative. No distress.  HENT:  Head: Normocephalic and atraumatic.  Eyes: EOM are normal.  Neck: Normal range of motion. Neck supple. No tracheal  deviation present. No thyromegaly present.  Cardiovascular: Normal rate, S1 normal, S2 normal and normal heart sounds. Exam reveals no gallop.  No murmur heard. Pulses:      Dorsalis pedis pulses are 1+ on the right side and 1+ on the left side.       Posterior tibial pulses are 1+ on the right side and 1+ on the left side.  Pulmonary/Chest: Breath sounds normal. No respiratory distress. He has no wheezes.  Abdominal: Soft. Bowel sounds are normal. He exhibits no distension. There is no abdominal tenderness. There is no guarding and no CVA tenderness.  Musculoskeletal:        General: No edema.     Right shoulder: He exhibits no swelling and no deformity.  Neurological: He is alert and oriented to person, place, and time. He has normal strength and normal reflexes. No cranial nerve deficit or sensory deficit. Gait normal.  Skin: Skin is warm and dry. No rash noted. No cyanosis. Nails show no clubbing.  Psychiatric: He has a normal  mood and affect. His speech is normal and behavior is normal. Judgment and thought content normal. Cognition and memory are normal.    Results for orders placed or performed in visit on 04/21/18  Comprehensive metabolic panel  Result Value Ref Range   Glucose 165 (H) 65 - 99 mg/dL   BUN 22 8 - 27 mg/dL   Creatinine, Ser 0.93 0.76 - 1.27 mg/dL   GFR calc non Af Amer 84 >59 mL/min/1.73   GFR calc Af Amer 97 >59 mL/min/1.73   BUN/Creatinine Ratio 24 10 - 24   Sodium 145 (H) 134 - 144 mmol/L   Potassium 4.0 3.5 - 5.2 mmol/L   Chloride 99 96 - 106 mmol/L   CO2 28 20 - 29 mmol/L   Calcium 9.6 8.6 - 10.2 mg/dL   Total Protein 6.9 6.0 - 8.5 g/dL   Albumin 4.5 3.8 - 4.8 g/dL   Globulin, Total 2.4 1.5 - 4.5 g/dL   Albumin/Globulin Ratio 1.9 1.2 - 2.2   Bilirubin Total 0.7 0.0 - 1.2 mg/dL   Alkaline Phosphatase 130 (H) 39 - 117 IU/L   AST 21 0 - 40 IU/L   ALT 21 0 - 44 IU/L  Lipid Panel w/o Chol/HDL Ratio  Result Value Ref Range   Cholesterol, Total 111 100 - 199 mg/dL   Triglycerides 92 0 - 149 mg/dL   HDL 35 (L) >39 mg/dL   VLDL Cholesterol Cal 18 5 - 40 mg/dL   LDL Calculated 58 0 - 99 mg/dL  Microalbumin / creatinine urine ratio  Result Value Ref Range   Creatinine, Urine 155.5 Not Estab. mg/dL   Microalbumin, Urine 11.9 Not Estab. ug/mL   Microalb/Creat Ratio 8 0 - 29 mg/g creat  Hgb A1c w/o eAG  Result Value Ref Range   Hgb A1c MFr Bld 7.7 (H) 4.8 - 5.6 %  T4, free  Result Value Ref Range   Free T4 1.34 0.82 - 1.77 ng/dL  TSH  Result Value Ref Range   TSH 2.110 0.450 - 4.500 uIU/mL  VITAMIN D 25 Hydroxy (Vit-D Deficiency, Fractures)  Result Value Ref Range   Vit D, 25-Hydroxy 39.6 30.0 - 100.0 ng/mL  Vitamin B12  Result Value Ref Range   Vitamin B-12 >2000 (H) 232 - 1245 pg/mL  Specimen status report  Result Value Ref Range   specimen status report Comment    Diabetic Labs (most recent): Lab Results  Component Value Date  HGBA1C 7.7 (H) 04/21/2018   HGBA1C 7.3  (H) 10/03/2017   HGBA1C 6.6 (H) 03/25/2017     Assessment & Plan:   1. Type 2 diabetes mellitus with vascular disease (HCC)  His diabetes is  complicated by CVA and patient remains at a high risk for more acute and chronic complications of diabetes which include CAD, CVA, CKD, retinopathy, and neuropathy. These are all discussed in detail with the patient. - His current labs showed A1c of 7.7%, slowly increasing from 6.6%.    -He is regaining some weight after he lost 124 pounds related to his Roux-en-Y gastric bypass.  Recent labs reviewed.  - I have re-counseled the patient on diet management and weight loss  by adopting a carbohydrate restricted / protein rich  Diet.  - Patient admits there is a room for improvement in his diet and drink choices. -  Suggestion is made for him to avoid simple carbohydrates  from his diet including Cakes, Sweet Desserts / Pastries, Ice Cream, Soda (diet and regular), Sweet Tea, Candies, Chips, Cookies, Store Bought Juices, Alcohol in Excess of  1-2 drinks a day, Artificial Sweeteners, and "Sugar-free" Products. This will help patient to have stable blood glucose profile and potentially avoid unintended weight gain.   - Patient is advised to stick to a routine mealtimes to eat 3 meals  a day and avoid unnecessary snacks ( to snack only to correct hypoglycemia).  - I have approached patient with the following individualized plan to manage diabetes and patient agrees. - After a successful Roux-en-Y bariatric surgery and lost 124 pounds, he was able to come off of multiple daily injections of insulin therapy. -He has gained 9 pounds since last visit, related to some dietary indiscretions. -He is advised to continue metformin  1000 mg by mouth twice a day. -He will benefit from early re- initiation of incretin therapy.  I discussed and initiated Trulicity 1.49 mg subcutaneously weekly with samples and prescribed Trulicity 1.5 mg subcutaneously weekly.  -  Patient specific target  for A1c; LDL, HDL, Triglycerides, and  Waist Circumference were discussed in detail.  2) BP/HTN: His blood pressure is controlled to target.  He is currently on amlodipine 10 mg p.o. daily, carvedilol 12.5 mg p.o. twice daily, chlorthalidone 1212.5 mg p.o. daily, as well as lisinopril 40 mg p.o. daily.    3) Lipids/HPL: His recent lipid panel showed controlled LDL at 58.  He is advised to continue Lipitor 10 mg p.o. nightly.   4)  Weight/Diet: He is status post Roux-en-Y bariatric surgery, he is regaining some pounds after he lost a total of 124 pounds overall.  -His vitamin B12 is excessive > 2000, advised to discontinue all vitamin B12 supplements for now and repeat before his next visit.  CDE consult in progress, exercise, and carbohydrates information provided.  5) Chronic Care/Health Maintenance:  -Patient is on ACEI/ARB and Statin medications and encouraged to continue to follow up with Ophthalmology, Podiatrist at least yearly or according to recommendations, and advised to  stay away from smoking. I have recommended yearly flu vaccine and pneumonia vaccination at least every 5 years; moderate intensity exercise for up to 150 minutes weekly; and  sleep for at least 7 hours a day.   6) Vitamin D deficiency -His vitamin D is replete at 84, currently on ongoing vitamin D3 supplement with 2000 units daily, advised to continue.      - I advised patient to maintain close follow up with his PCP and  he is bariatric team for ongoing care needs.  - Time spent with the patient: 25 min, of which >50% was spent in reviewing his  current and  previous labs/studies, previous treatments, and medications doses and developing a plan for long-term care based on the latest recommendations for standards of care. Zachary Murillo. participated in the discussions, expressed understanding, and voiced agreement with the above plans.  All questions were answered to his satisfaction.  he is encouraged to contact clinic should he have any questions or concerns prior to his return visit.   Follow up plan: -Return in about 4 months (around 08/29/2018) for Follow up with Pre-visit Labs, Meter, and Logs.  Glade Lloyd, MD Phone: [phone]  Fax: [phone]  -  This note was partially dictated with voice recognition software. Similar sounding words can be transcribed inadequately or may not  be corrected upon review.  04/30/2018, 8:57 AM

## 2018-04-30 NOTE — Patient Instructions (Signed)

## 2018-05-23 ENCOUNTER — Other Ambulatory Visit: Payer: Self-pay | Admitting: "Endocrinology

## 2018-06-07 ENCOUNTER — Encounter (HOSPITAL_COMMUNITY): Payer: Self-pay | Admitting: Emergency Medicine

## 2018-06-07 ENCOUNTER — Emergency Department (HOSPITAL_COMMUNITY)
Admission: EM | Admit: 2018-06-07 | Discharge: 2018-06-07 | Disposition: A | Discharge status: Home / Self Care | Payer: Medicare Other | Attending: Emergency Medicine | Admitting: Emergency Medicine

## 2018-06-07 ENCOUNTER — Emergency Department (HOSPITAL_COMMUNITY): Payer: Medicare Other

## 2018-06-07 ENCOUNTER — Other Ambulatory Visit: Payer: Self-pay

## 2018-06-07 DIAGNOSIS — Z7984 Long term (current) use of oral hypoglycemic drugs: Secondary | ICD-10-CM | POA: Diagnosis not present

## 2018-06-07 DIAGNOSIS — E119 Type 2 diabetes mellitus without complications: Secondary | ICD-10-CM | POA: Diagnosis not present

## 2018-06-07 DIAGNOSIS — Z79899 Other long term (current) drug therapy: Secondary | ICD-10-CM | POA: Insufficient documentation

## 2018-06-07 DIAGNOSIS — I10 Essential (primary) hypertension: Secondary | ICD-10-CM | POA: Insufficient documentation

## 2018-06-07 DIAGNOSIS — R4701 Aphasia: Secondary | ICD-10-CM | POA: Diagnosis present

## 2018-06-07 DIAGNOSIS — Z7982 Long term (current) use of aspirin: Secondary | ICD-10-CM | POA: Insufficient documentation

## 2018-06-07 DIAGNOSIS — G459 Transient cerebral ischemic attack, unspecified: Secondary | ICD-10-CM

## 2018-06-07 LAB — COMPREHENSIVE METABOLIC PANEL
ALK PHOS: 103 U/L (ref 38–126)
ALT: 22 U/L (ref 0–44)
AST: 23 U/L (ref 15–41)
Albumin: 4.5 g/dL (ref 3.5–5.0)
Anion gap: 11 (ref 5–15)
BILIRUBIN TOTAL: 1 mg/dL (ref 0.3–1.2)
BUN: 34 mg/dL — ABNORMAL HIGH (ref 8–23)
CALCIUM: 9.7 mg/dL (ref 8.9–10.3)
CO2: 27 mmol/L (ref 22–32)
Chloride: 100 mmol/L (ref 98–111)
Creatinine, Ser: 1.02 mg/dL (ref 0.61–1.24)
GFR calc Af Amer: >60 mL/min (ref >60)
Glucose, Bld: 103 mg/dL — ABNORMAL HIGH (ref 70–99)
Potassium: 3.8 mmol/L (ref 3.5–5.1)
Sodium: 138 mmol/L (ref 135–145)
TOTAL PROTEIN: 7.7 g/dL (ref 6.5–8.1)

## 2018-06-07 LAB — CBC
HEMATOCRIT: 46.2 % (ref 39.0–52.0)
Hemoglobin: 15.6 g/dL (ref 13.0–17.0)
MCH: 30.1 pg (ref 26.0–34.0)
MCHC: 33.8 g/dL (ref 30.0–36.0)
MCV: 89.2 fL (ref 80.0–100.0)
Platelets: 288 10*3/uL (ref 150–400)
RBC: 5.18 MIL/uL (ref 4.22–5.81)
RDW: 13.5 % (ref 11.5–15.5)
WBC: 10.5 10*3/uL (ref 4.0–10.5)
nRBC: 0 % (ref 0.0–0.2)

## 2018-06-07 LAB — DIFFERENTIAL
Abs Immature Granulocytes: 0.02 10*3/uL (ref 0.00–0.07)
Basophils Absolute: 0.1 10*3/uL (ref 0.0–0.1)
Basophils Relative: 1 %
Eosinophils Absolute: 0.3 10*3/uL (ref 0.0–0.5)
Eosinophils Relative: 3 %
Immature Granulocytes: 0 %
LYMPHS ABS: 2.2 10*3/uL (ref 0.7–4.0)
Lymphocytes Relative: 21 %
Monocytes Absolute: 0.9 10*3/uL (ref 0.1–1.0)
Monocytes Relative: 8 %
Neutro Abs: 7.1 10*3/uL (ref 1.7–7.7)
Neutrophils Relative %: 67 %

## 2018-06-07 LAB — PROTIME-INR
INR: 1 (ref 0.8–1.2)
Prothrombin Time: 12.8 seconds (ref 11.4–15.2)

## 2018-06-07 LAB — APTT: aPTT: 32 seconds (ref 24–36)

## 2018-06-07 LAB — CBG MONITORING, ED: Glucose-Capillary: 105 mg/dL — ABNORMAL HIGH (ref 70–99)

## 2018-06-07 MED ORDER — CLOPIDOGREL BISULFATE 75 MG PO TABS: 75.0000 mg | ORAL_TABLET | Freq: Every day | ORAL | 1 refills | Status: AC

## 2018-06-07 MED ORDER — ASPIRIN 325 MG PO TABS
325.0000 mg | ORAL_TABLET | Freq: Once | ORAL | Status: AC
  Administered 2018-06-07: 325 mg via ORAL
  Filled 2018-06-07: qty 1

## 2018-06-07 MED ORDER — CLOPIDOGREL BISULFATE 75 MG PO TABS
75.0000 mg | ORAL_TABLET | Freq: Once | ORAL | Status: AC
  Administered 2018-06-07: 75 mg via ORAL
  Filled 2018-06-07: qty 1

## 2018-06-07 NOTE — ED Triage Notes (Addendum)
Patient c/o slurred speech that started suddenly an hour and half ago. Denies any dizziness, headache, facial drooping, or weakness. Denies taking any type of anticoagulants. Per patient hx of stroke 3-4 years ago with no deficits. Patient states slurred speech has improved since arriving to ED.

## 2018-06-07 NOTE — ED Notes (Signed)
EDP Dr. Eulis Foster evaluated pt at this time.

## 2018-06-07 NOTE — Consult Note (Signed)
[facility] Tele[facility]urology Consult Services   Date of Service:   06/07/2018 13:09:16  Impression:     .  Transient Ischemic Attack  Comments/Sign-Out: Symptoms suggested an isolated small vessel TIA noting it is not clear if he was dysarthric or aphasic. He had no motor or sensory findings. He has multiple risk factors as described. He needs to be worked up for sources including and Echo if it's not been done recently, fasting lipid profile, A1c, and his blood pressure is already reasonably controlled for the acute process.  Mechanism of Stroke: Not Clear  Metrics: Last Known Well: 06/07/2018 11:00:00 [facility] Notification Time: 06/07/2018 13:09:16 Arrival Time: 06/07/2018 12:57:00 Stamp Time: 06/07/2018 13:09:16 Time First Login Attempt: 06/07/2018 13:13:51 Video Start Time: 06/07/2018 13:13:51  Symptoms: Slurred speech NIHSS Start Assessment Time: 06/07/2018 13:19:52 Patient is not a candidate for tPA. Patient was not deemed candidate for tPA thrombolytics because of Resolved symptoms (no residual disabling symptoms). Video End Time: 06/07/2018 13:27:21  CT head showed no acute hemorrhage or acute core infarct. CT head was not reviewed.  Clinical Presentation is not Suggestive of Large Vessel Occlusive Disease, Patient is not a Candidate for Thrombectomy  Radiologist was not called back for review of advanced imaging because Not indicated. Note: CT never made it to the medical record but the radiologist called and assured [place] there were no acute changes. ED Physician notified of diagnostic impression and management plan on 06/07/2018 13:26:00  Our recommendations are outlined below.  Recommendations:     .  Activate Stroke Protocol Admission/Order Set     .  Stroke/Telemetry Floor     .  Neuro Checks     .  Bedside Swallow Eval     .  DVT Prophylaxis     .  IV Fluids, Normal Saline     .  Head of Bed Below 30 Degrees     .  Euglycemia and  Avoid Hyperthermia (PRN Acetaminophen)     .  Antiplatelet Therapy Recommended  Routine Consultation with [facility] for Follow up Care  Sign Out:     .  Discussed with Emergency Department Provider    ------------------------------------------------------------------------------  History of Present Illness: Patient is a 68 year old Male.  Patient was brought by private transportation with symptoms of Slurred speech  This 68 year old man was normal at 1045 this morning and then over the next few minutes developed slurred speech which has resolved. He had no loss of vision or weakness or numbness of the extremities. He claimed to have had a small stroke several years ago. He has a history of hypertension. He is diabetic. He has a history of cardiomyopathy, unknown cause. He has had primary hyperaldosteronism. He has obstructive sleep apnea.  CT head showed no acute hemorrhage or acute core infarct. CT head was not reviewed.  Last seen normal was within 4.5 hours. There is no history of hemorrhagic complications or intracranial hemorrhage. There is no history of Recent Anticoagulants. There is no history of recent major surgery. There is no history of recent stroke.  Examination: BP(150/85), Pulse(71), Blood Glucose(105) 1A: Level of Consciousness - Alert; keenly responsive + 0 1B: Ask Month and Age - Both Questions Right + 0 1C: Blink Eyes & Squeeze Hands - Performs Both Tasks + 0 2: Test Horizontal Extraocular Movements - Normal + 0 3: Test Visual Fields - No Visual Loss + 0 4: Test Facial Palsy (Use Grimace if Obtunded) - Normal symmetry + 0 5A: Test Left  Arm Motor Drift - No Drift for 10 Seconds + 0 5B: Test Right Arm Motor Drift - No Drift for 10 Seconds + 0 6A: Test Left Leg Motor Drift - No Drift for 5 Seconds + 0 6B: Test Right Leg Motor Drift - No Drift for 5 Seconds + 0 7: Test Limb Ataxia (FNF/Heel-Shin) - No Ataxia + 0 8: Test Sensation - Normal; No sensory  loss + 0 9: Test Language/Aphasia - Normal; No aphasia + 0 10: Test Dysarthria - Normal + 0 11: Test Extinction/Inattention - No abnormality + 0  NIHSS Score: 0  Patient was informed the Neurology Consult would happen via TeleHealth consult by way of interactive audio and video telecommunications and consented to receiving care in this manner.  Due to the immediate potential for life-threatening deterioration due to underlying acute neurologic illness, I spent 35 minutes providing critical care. This time includes time for face to face visit via telemedicine, review of medical records, imaging studies and discussion of findings with providers, the patient and/or family.   Dr Cindie Laroche   [facility] [phone]  Case [id number]

## 2018-06-07 NOTE — ED Notes (Signed)
Code Stroke paged out 

## 2018-06-07 NOTE — ED Notes (Signed)
Blood work handed to Dispensing optician at this time.

## 2018-06-07 NOTE — ED Notes (Signed)
Decision to Not give TPA made at this time. PT's NIHHS is now a 0.

## 2018-06-07 NOTE — ED Notes (Signed)
Telestroke cart activated at this time.

## 2018-06-07 NOTE — Discharge Instructions (Addendum)
Dr. Luan Pulling will call you tomorrow, to help arrange further treatment including MRI and carotid Dopplers.  We sent a prescription for Plavix to your pharmacy.  Begin taking a full-strength aspirin tomorrow instead of your baby aspirin.  Take 1 full strength aspirin every day.  Return here, if needed, for problems.

## 2018-06-07 NOTE — ED Provider Notes (Signed)
[facility] EMERGENCY DEPARTMENT Provider Note   CSN: [id number] Arrival date & time: 06/07/18  1256    History   Chief Complaint Chief Complaint  Patient presents with   Aphasia    HPI Zachary Murillo. is a 68 y.o. male.     HPI   Patient noticed sudden onset of "slurred speech" about 90 minutes ago.  He called his PCP who advised that he come here for evaluation.  I discussed the situation with Dr. Luan Pulling. Patient was with his wife today doing his usual activities, when she noticed that he was speaking abnormally.  He previously had this happen when he had a "stroke."  No recent illnesses including fever, vomiting, cough, dizziness, chest pain, traumatic injuries or known sick contacts.  There are no other known modifying factors.  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   Mixed hyperlipidemia 01/30/2015   Essential hypertension, benign 01/30/2015   Vitamin D deficiency 01/30/2015   Class 1 obesity due to excess calories with serious comorbidity and body mass index (BMI) of 32.0 to 32.9 in adult 01/30/2015   DDD (degenerative disc disease), 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/2013   Difficulty in walking(719.7) 01/25/2013   CVA (cerebral infarction) 01/22/2013   Lack of coordination 01/22/2013   OA (osteoarthritis) of knee 09/05/2011   Knee pain, right 09/05/2011   KNEE, ARTHRITIS, DEGEN./OSTEO 01/30/2009   SHOULDER PAIN 01/16/2009   KNEE PAIN 01/16/2009   IMPINGEMENT SYNDROME 01/16/2009   TENDINITIS, LEFT KNEE 01/16/2009    Past Surgical History:  Procedure Laterality Date   BACK SURGERY     CARDIAC CATHETERIZATION     KNEE ARTHROSCOPY     NASAL SEPTUM SURGERY          Home Medications      Prior to Admission medications   Medication Sig Start Date End Date Taking? Authorizing Provider  amLODipine (NORVASC) 10 MG tablet Take 10 mg by mouth daily.    [provider]  aspirin EC 81 MG tablet Take 81 mg by mouth once.     [provider]  atorvastatin (LIPITOR) 10 MG tablet TAKE (1) TABLET BY MOUTH AT BEDTIME. 05/25/18   Nida, Marella Chimes, MD  Calcium Citrate-Vitamin D (CALCIUM + D PO) Take by mouth 2 (two) times daily.    [provider]  carvedilol (COREG) 25 MG tablet Take 12.5 mg by mouth 2 (two) times daily with a meal.     [provider]  cetirizine (ZYRTEC) 10 MG tablet Take 10 mg by mouth daily as needed for allergies.     [provider]  chlorthalidone (HYGROTON) 25 MG tablet Take 12.5 mg by mouth daily.  03/19/17 03/19/18  [provider]  Cholecalciferol (VITAMIN D) 2000 units CAPS Take by mouth.    [provider]  clopidogrel (PLAVIX) 75 MG tablet Take 1 tablet (75 mg total) by mouth daily. 06/07/18   Daleen Bo, MD  docusate sodium (COLACE) 100 MG capsule Take 100 mg by mouth 2 (two) times daily.    [provider]  Dulaglutide (TRULICITY) 1.5 ON/6.2XB SOPN Inject 1.5 mg into the skin once a week. 04/30/18   Cassandria Anger, MD  lisinopril (PRINIVIL,ZESTRIL) 40 MG tablet Take 40 mg by mouth every morning.    [provider]  metFORMIN (GLUCOPHAGE) 1000 MG tablet TAKE 1 TABLET BY MOUTH TWICE DAILY WITH A MEAL. 03/23/18   Cassandria Anger, MD  Multiple Vitamins-Minerals (CENTRUM PO) Take 1 tablet by mouth every morning.    [provider]  sertraline (ZOLOFT) 50 MG tablet Take 1 tablet (50 mg total) by mouth daily. 01/20/13   Sinda Du, MD    Family History Family History  Problem Relation Age of Onset   Heart disease Other    Arthritis Other    Cancer Other    Asthma Other    Diabetes Other     Social History Social History   Tobacco Use    Smoking status: Never Smoker   Smokeless tobacco: Never Used  Substance Use Topics   Alcohol use: Yes    Comment: occ   Drug use: No     Allergies   Prednisone   Review of Systems Review of Systems  All other systems reviewed and are negative.    Physical Exam Updated Vital Signs BP 129/67    Pulse 66    Temp 97.9 F (36.6 C) (Oral)    Resp 17    Ht 5' 10.5" (1.791 m)    Wt 102.1 kg    SpO2 96%    BMI 31.83 kg/m   Physical Exam Vitals signs and nursing note reviewed.  Constitutional:      Appearance: He is well-developed.  HENT:     Head: Normocephalic and atraumatic.     Right Ear: External ear normal.     Left Ear: External ear normal.  Eyes:     Conjunctiva/sclera: Conjunctivae normal.     Pupils: Pupils are equal, round, and reactive to light.  Neck:     Musculoskeletal: Normal range of motion and neck supple.     Trachea: Phonation normal.  Cardiovascular:     Rate and Rhythm: Normal rate and regular rhythm.     Heart sounds: Normal heart sounds.  Pulmonary:     Effort: Pulmonary effort is normal.     Breath sounds: Normal breath sounds.  Abdominal:     Palpations: Abdomen is soft.     Tenderness: There is no abdominal tenderness.  Musculoskeletal: Normal range of motion.        General: No swelling or tenderness.     Right lower leg: No edema.     Left lower leg: No edema.  Skin:    General: Skin is warm and dry.  Neurological:     Mental Status: He is alert and oriented to person, place, and time.     Cranial Nerves: No cranial nerve deficit.     Sensory: No sensory deficit.     Motor: No abnormal muscle tone.     Coordination: Coordination normal.     Comments: Examination at 1:50 PM-no dysarthria, aphasia or nystagmus.  Normal strength arms and legs bilaterally.  No pronator drift.  Psychiatric:        Mood and Affect: Mood normal.        Behavior: Behavior normal.        Thought Content: Thought content normal.        Judgment: Judgment  normal.      ED Treatments / Results  Labs (all labs ordered are listed, but only abnormal results are displayed) Labs Reviewed  COMPREHENSIVE METABOLIC PANEL - Abnormal; Notable for the following components:  Result Value   Glucose, Bld 103 (*)    BUN 34 (*)    All other components within normal limits  CBG MONITORING, ED - Abnormal; Notable for the following components:   Glucose-Capillary 105 (*)    All other components within normal limits  PROTIME-INR  APTT  CBC  DIFFERENTIAL    EKG EKG Interpretation  Date/Time:  Sunday June 07 2018 13:03:56 EDT Ventricular Rate:  74 PR Interval:    QRS Duration: 163 QT Interval:  465 QTC Calculation: 516 R Axis:   -76 Text Interpretation:  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 Aphasia and slurred speech. Last seen normal at 10:45 a.m. EXAM: CT HEAD WITHOUT CONTRAST TECHNIQUE: Contiguous axial images were obtained from the base of the skull through the vertex without intravenous contrast. COMPARISON:  MR head 01/19/2013. FINDINGS: Brain: No evidence for acute infarction, hemorrhage, mass lesion, hydrocephalus, or extra-axial fluid. Mild cerebral and cerebellar atrophy. Moderately advanced T2 and FLAIR hyperintensities throughout the periventricular and subcortical white matter, likely small vessel disease. Vascular: Calcification of the cavernous internal carotid arteries consistent with cerebrovascular atherosclerotic disease. No signs of intracranial large vessel occlusion. Skull: Calvarium intact. Sinuses/Orbits: Clear sinuses. Negative orbits. Other: None. ASPECTS ([place] Stroke Program Early CT Score) - Ganglionic level infarction (caudate, lentiform nuclei, internal capsule, insula, M1-M3 cortex): 7 -  Supraganglionic infarction (M4-M6 cortex): 3 Total score (0-10 with 10 being normal): 10 Compared with prior MR, the atrophy and small vessel disease has mildly progressed, when technique differences are considered. IMPRESSION: 1. Atrophy and small vessel disease. No acute intracranial findings. 2. ASPECTS is 10. These results were called by telephone at the time of interpretation on 06/07/2018 at 1:23 pm to Dr. Daleen Bo , who verbally acknowledged these results. * Electronically Signed   By: Staci Righter M.D.   On: 06/07/2018 13:25    Procedures .Critical Care Performed by: Daleen Bo, MD Authorized by: Daleen Bo, MD   Critical care provider statement:    Critical care time (minutes):  50   Critical care start time:  06/07/2018 1:20 PM   Critical care end time:  06/07/2018 3:25 PM   Critical care time was exclusive of:  Separately billable procedures and treating other patients   Critical care was necessary to treat or prevent imminent or life-threatening deterioration of the following conditions:  CNS failure or compromise   Critical care was time spent personally by me on the following activities:  Blood draw for specimens, development of treatment plan with patient or surrogate, discussions with consultants, evaluation of patient's response to treatment, examination of patient, obtaining history from patient or surrogate, ordering and performing treatments and interventions, ordering and review of laboratory studies, pulse oximetry, re-evaluation of patient's condition, review of old charts and ordering and review of radiographic studies   (including critical care time)  Medications Ordered in ED Medications  aspirin tablet 325 mg (325 mg Oral Given 06/07/18 1506)  clopidogrel (PLAVIX) tablet 75 mg (75 mg Oral Given 06/07/18 1506)     Initial Impression / Assessment and Plan / ED Course  I have reviewed the triage vital signs and the nursing notes.  Pertinent labs & imaging  results that were available during my care of the patient were reviewed by me and considered in  my medical decision making (see chart for details).  Clinical Course as of Jun 07 1526  Sun Jun 07, 2018  1305 Patient seen on way to room, in wheelchair, I talked to him and he had slurred speech.  Mild dysarthria is present.  No aphasia.  No pronator drift.  Fair strength arms and legs bilaterally.   [EW]  1325 Case discussed with stroke neurologist, Dr. Lucia Gaskins, who saw the patient by telemetry.  He states that the patient's dysarthria has resolved, and that his current diagnosis is TIA.   [EW]  5465 Patient states his speech is better, he had very minimal word finding difficulty at this time.  No clear aphasia at this time.  No change in strength, or mentation.   [EW]  1335 Case discussed with patient's PCP who is willing to evaluate and treat for TIA as an outpatient.  Would like the patient to be on Plavix and aspirin.   [EW]  1337 No CVA, mass or bleeding, images reviewed by me.  I discussed these findings with the radiologist who read the films.  CT HEAD CODE STROKE WO CONTRAST [EW]  1337 Mild elevation  CBG monitoring, ED(!) [EW]  1338 Normal  Protime-INR [EW]  1338 Normal  APTT [EW]  1338 Normal  Differential [EW]  1338 Normal  CBC [EW]    Clinical Course User Index [EW] Daleen Bo, MD        Patient Vitals for the past 24 hrs:  BP Temp Temp src Pulse Resp SpO2 Height Weight  06/07/18 1445 129/67 -- -- 66 17 96 % -- --  06/07/18 1430 112/67 -- -- 61 14 96 % -- --  06/07/18 1415 118/68 -- -- 63 16 94 % -- --  06/07/18 1400 120/78 -- -- 67 18 97 % -- --  06/07/18 1345 138/64 -- -- 65 15 98 % -- --  06/07/18 1330 (!) 141/88 -- -- 70 16 97 % -- --  06/07/18 1320 (!) 141/77 -- -- 70 13 96 % -- --  06/07/18 1305 (!) 150/85 -- -- 68 13 95 % 5' 10.5" (1.791 m) 102.1 kg  06/07/18 1304 (!) 150/85 97.9 F (36.6 C) Oral 71 15 97 % -- --    1:48 PM Reevaluation with update and  discussion. After initial assessment and treatment, an updated evaluation reveals he remains comfortable, no clear dysarthria at this time.  He is agreeable to planning discussed. Daleen Bo   Reevaluation- 2:37 PM-he is alert and comfortable, no clear evidence for dysarthria at this time.  2:45 PM-case discussed with patient's wife, in detail.  She will come and get him.  Medical Decision Making: Speech difficulty, acute onset, likely resolved while in the ED.  He was seen by tele-neurology, urgently, who diagnosed TIA.  Patient has previous stroke.  The case was discussed with the patient's PCP who will evaluate and treat as an outpatient, considering the Covid-19 pandemic.  This seems to be the most appropriate mode for evaluation and treatment of this process at this time.  Patient is in agreement with this plan.  No evidence for large stroke.  Doubt ACS, PE or pneumonia.  Doubt impending vascular collapse.  CRITICAL CARE-yes Performed by: Daleen Bo   Nursing Notes Reviewed/ Care Coordinated Applicable Imaging Reviewed Interpretation of Laboratory Data incorporated into ED treatment  The patient appears reasonably screened and/or stabilized for discharge and I doubt any other medical condition or other [facility] requiring further screening, evaluation, or treatment in the  ED at this time prior to discharge.  Plan: Home Medications-change baby aspirin to full strength daily, continue other medicines; Home Treatments-rest, fluids; return here if the recommended treatment, does not improve the symptoms; Recommended follow up-PCP to initiate evaluation as an outpatient to risk-stratify and control symptoms     Final Clinical Impressions(s) / ED Diagnoses   Final diagnoses:  TIA (transient ischemic attack)    ED Discharge Orders         Ordered    clopidogrel (PLAVIX) 75 MG tablet  Daily     06/07/18 1517           Daleen Bo, MD 06/07/18 1528

## 2018-06-07 NOTE — Progress Notes (Signed)
CODE STROKE 1301 call time 1306 exam started 0871 exam finished 1309 images to [facility] 1314 exam completed in Epic 1315 called [facility] Radiology

## 2018-06-08 ENCOUNTER — Other Ambulatory Visit: Payer: Self-pay | Admitting: Pulmonary Disease

## 2018-06-08 ENCOUNTER — Ambulatory Visit (HOSPITAL_COMMUNITY)
Admission: RE | Admit: 2018-06-08 | Discharge: 2018-06-08 | Disposition: A | Discharge status: Home / Self Care | Payer: Medicare Other | Source: Ambulatory Visit | Attending: Pulmonary Disease | Admitting: Pulmonary Disease

## 2018-06-08 ENCOUNTER — Other Ambulatory Visit (HOSPITAL_COMMUNITY): Payer: Self-pay | Admitting: Pulmonary Disease

## 2018-06-08 DIAGNOSIS — G459 Transient cerebral ischemic attack, unspecified: Secondary | ICD-10-CM | POA: Diagnosis not present

## 2018-06-15 ENCOUNTER — Other Ambulatory Visit: Payer: Self-pay | Admitting: "Endocrinology

## 2018-08-06 ENCOUNTER — Telehealth: Payer: Self-pay | Admitting: Pulmonary Disease

## 2018-08-06 ENCOUNTER — Other Ambulatory Visit: Payer: Self-pay | Admitting: "Endocrinology

## 2018-08-06 NOTE — Telephone Encounter (Signed)
° ° ° °  1. Which medications need to be refilled? (please list name of each medication and dose if known)  clopidogrel (PLAVIX) 75 MG tablet [[id number]]    2. Which pharmacy/location (including street and city if local pharmacy) is medication to be sent to?   [place]  3. Do they need a 30 day or 90 day supply?   90 day

## 2018-08-07 LAB — COMPREHENSIVE METABOLIC PANEL
ALT: 20 IU/L (ref 0–44)
AST: 22 IU/L (ref 0–40)
Albumin/Globulin Ratio: 2 (ref 1.2–2.2)
Albumin: 4.5 g/dL (ref 3.8–4.8)
Alkaline Phosphatase: 108 IU/L (ref 39–117)
BUN/Creatinine Ratio: 20 (ref 10–24)
BUN: 20 mg/dL (ref 8–27)
Bilirubin Total: 0.8 mg/dL (ref 0.0–1.2)
CO2: 28 mmol/L (ref 20–29)
Calcium: 9.7 mg/dL (ref 8.6–10.2)
Chloride: 98 mmol/L (ref 96–106)
Creatinine, Ser: 1 mg/dL (ref 0.76–1.27)
GFR calc Af Amer: 89 mL/min/{1.73_m2} (ref >59)
GFR calc non Af Amer: 77 mL/min/{1.73_m2} (ref >59)
Globulin, Total: 2.2 g/dL (ref 1.5–4.5)
Glucose: 95 mg/dL (ref 65–99)
Potassium: 4 mmol/L (ref 3.5–5.2)
Sodium: 142 mmol/L (ref 134–144)
Total Protein: 6.7 g/dL (ref 6.0–8.5)

## 2018-08-07 LAB — SPECIMEN STATUS REPORT

## 2018-08-07 LAB — VITAMIN B12: Vitamin B-12: 779 pg/mL (ref 232–1245)

## 2018-08-07 LAB — VITAMIN D 25 HYDROXY (VIT D DEFICIENCY, FRACTURES): Vit D, 25-Hydroxy: 51.1 ng/mL (ref 30.0–100.0)

## 2018-08-07 LAB — HGB A1C W/O EAG: Hgb A1c MFr Bld: 5.9 % — ABNORMAL HIGH (ref 4.8–5.6)

## 2018-08-07 NOTE — Telephone Encounter (Signed)
This pt has not ben seen by our practice. Will forward to Dr. Luan Pulling and see if he can get one of his nurses to fill.

## 2018-08-10 ENCOUNTER — Encounter: Payer: Self-pay | Admitting: "Endocrinology

## 2018-08-10 ENCOUNTER — Encounter (INDEPENDENT_AMBULATORY_CARE_PROVIDER_SITE_OTHER): Payer: Self-pay

## 2018-08-10 ENCOUNTER — Other Ambulatory Visit: Payer: Self-pay

## 2018-08-10 ENCOUNTER — Ambulatory Visit (INDEPENDENT_AMBULATORY_CARE_PROVIDER_SITE_OTHER): Payer: Medicare Other | Admitting: "Endocrinology

## 2018-08-10 VITALS — BP 107/72 | HR 69 | Ht 69.0 in | Wt 227.0 lb

## 2018-08-10 DIAGNOSIS — E782 Mixed hyperlipidemia: Secondary | ICD-10-CM

## 2018-08-10 DIAGNOSIS — E1159 Type 2 diabetes mellitus with other circulatory complications: Secondary | ICD-10-CM | POA: Diagnosis not present

## 2018-08-10 DIAGNOSIS — I10 Essential (primary) hypertension: Secondary | ICD-10-CM

## 2018-08-10 MED ORDER — METFORMIN HCL 500 MG PO TABS: 500.0000 mg | ORAL_TABLET | Freq: Two times a day (BID) | ORAL | 6 refills | Status: DC

## 2018-08-10 NOTE — Progress Notes (Signed)
Endocrinology follow-up note   Subjective:    Patient ID: Zachary Murillo., male    DOB: Aug 08, 1950, PCP Sinda Du, MD   Past Medical History:  Diagnosis Date  . Diabetes mellitus ([place])   . History of Roux-en-Y gastric bypass   . Hypertension   . Overweight(278.02)   . Sleep apnea   . Stroke [facility])    Past Surgical History:  Procedure Laterality Date  . BACK SURGERY    . CARDIAC CATHETERIZATION    . KNEE ARTHROSCOPY    . NASAL SEPTUM SURGERY     Social History   Socioeconomic History  . Marital status: Married    Spouse name: Not on file  . Number of children: Not on file  . Years of education: Masters  . Highest education level: Not on file  Occupational History  . Not on file  Social Needs  . Financial resource strain: Not on file  . Food insecurity:    Worry: Not on file    Inability: Not on file  . Transportation needs:    Medical: Not on file    Non-medical: Not on file  Tobacco Use  . Smoking status: Never Smoker  . Smokeless tobacco: Never Used  Substance and Sexual Activity  . Alcohol use: Yes    Comment: occ  . Drug use: No  . Sexual activity: Not Currently  Lifestyle  . Physical activity:    Days per week: Not on file    Minutes per session: Not on file  . Stress: Not on file  Relationships  . Social connections:    Talks on phone: Not on file    Gets together: Not on file    Attends religious service: Not on file    Active member of club or organization: Not on file    Attends meetings of clubs or organizations: Not on file    Relationship status: Not on file  Other Topics Concern  . Not on file  Social History Narrative  . Not on file   Outpatient Encounter Medications as of 08/10/2018  Medication Sig  . amLODipine (NORVASC) 10 MG tablet Take 10 mg by mouth daily.  Marland Kitchen aspirin EC 81 MG tablet Take 81 mg by mouth once.   Marland Kitchen atorvastatin (LIPITOR) 10 MG tablet TAKE (1) TABLET BY MOUTH AT BEDTIME.  . Calcium Citrate-Vitamin D  (CALCIUM + D PO) Take by mouth 2 (two) times daily.  . carvedilol (COREG) 25 MG tablet Take 12.5 mg by mouth 2 (two) times daily with a meal.   . cetirizine (ZYRTEC) 10 MG tablet Take 10 mg by mouth daily as needed for allergies.   . chlorthalidone (HYGROTON) 25 MG tablet Take 12.5 mg by mouth daily.   . Cholecalciferol (VITAMIN D) 2000 units CAPS Take by mouth.  . clopidogrel (PLAVIX) 75 MG tablet Take 1 tablet (75 mg total) by mouth daily.  . clopidogrel (PLAVIX) 75 MG tablet daily.  Marland Kitchen docusate sodium (COLACE) 100 MG capsule Take 100 mg by mouth 2 (two) times daily.  . Dulaglutide (TRULICITY) 1.5 JI/9.6VE SOPN Inject 1.5 mg into the skin once a week.  Marland Kitchen lisinopril (PRINIVIL,ZESTRIL) 40 MG tablet Take 40 mg by mouth every morning.  . metFORMIN (GLUCOPHAGE) 500 MG tablet Take 1 tablet (500 mg total) by mouth 2 (two) times daily with a meal.  . Multiple Vitamins-Minerals (CENTRUM PO) Take 1 tablet by mouth every morning.  . sertraline (ZOLOFT) 50 MG tablet Take 1 tablet (50  mg total) by mouth daily.  . [DISCONTINUED] metFORMIN (GLUCOPHAGE) 1000 MG tablet TAKE 1 TABLET BY MOUTH TWICE DAILY WITH A MEAL.   No facility-administered encounter medications on file as of 08/10/2018.    ALLERGIES: Allergies  Allergen Reactions  . Prednisone     Starts itching   VACCINATION STATUS:  There is no immunization history on file for this patient.  Diabetes  He presents for his follow-up diabetic visit. He has type 2 diabetes mellitus. Onset time: He was diagnosed at approximate age of 34 years. His disease course has been improving. There are no hypoglycemic associated symptoms. Pertinent negatives for hypoglycemia include no confusion, headaches, pallor or seizures. Pertinent negatives for diabetes include no chest pain, no fatigue, no polydipsia, no polyphagia, no polyuria and no weakness. There are no hypoglycemic complications. Symptoms are improving. Diabetic complications include a CVA. Risk factors  for coronary artery disease include diabetes mellitus, dyslipidemia, hypertension, male sex, obesity and sedentary lifestyle. Current diabetic treatment includes oral agent (dual therapy) and oral agent (monotherapy). He is compliant with treatment all of the time. His weight is decreasing steadily ( he lost 124 pounds status post gastric bypass). He is following a generally unhealthy diet. He has had a previous visit with a dietitian. He participates in exercise intermittently. An ACE inhibitor/angiotensin II receptor blocker is being taken. Eye exam is current.  Hyperlipidemia  This is a chronic problem. The current episode started more than 1 year ago. Pertinent negatives include no chest pain, myalgias or shortness of breath. Current antihyperlipidemic treatment includes statins. Risk factors for coronary artery disease include diabetes mellitus, dyslipidemia, hypertension, male sex, a sedentary lifestyle, obesity and family history.  Hypertension  This is a chronic problem. The current episode started more than 1 year ago. The problem is uncontrolled. Pertinent negatives include no chest pain, headaches, neck pain, palpitations or shortness of breath. Risk factors for coronary artery disease include diabetes mellitus, dyslipidemia, male gender and obesity. Hypertensive end-organ damage includes CVA. Identifiable causes of hypertension include hyperaldosteronism.    Review of Systems  Constitutional: Negative for fatigue and unexpected weight change.       He lost total of 124 pounds.   HENT: Negative for dental problem, mouth sores and trouble swallowing.   Eyes: Negative for visual disturbance.  Respiratory: Negative for cough, choking, chest tightness, shortness of breath and wheezing.   Cardiovascular: Negative for chest pain, palpitations and leg swelling.  Gastrointestinal: Negative for abdominal distention, abdominal pain, constipation, diarrhea, nausea and vomiting.  Endocrine: Negative  for polydipsia, polyphagia and polyuria.  Genitourinary: Negative for dysuria, flank pain, hematuria and urgency.  Musculoskeletal: Negative for back pain, gait problem, myalgias and neck pain.  Skin: Negative for pallor, rash and wound.  Neurological: Negative for seizures, syncope, weakness, numbness and headaches.  Psychiatric/Behavioral: Negative.  Negative for confusion and dysphoric mood.    Objective:    BP 107/72   Pulse 69   Ht 5\' 9"  (1.753 m)   Wt 227 lb (103 kg)   BMI 33.52 kg/m   Wt Readings from Last 3 Encounters:  08/10/18 227 lb (103 kg)  06/07/18 225 lb (102.1 kg)  04/30/18 237 lb (107.5 kg)    Physical Exam  Constitutional: He is oriented to person, place, and time. He appears well-developed and well-nourished. He is cooperative. No distress.  HENT:  Head: Normocephalic and atraumatic.  Eyes: EOM are normal.  Neck: Normal range of motion. Neck supple. No tracheal deviation present. No thyromegaly present.  Cardiovascular: Normal rate, S1 normal, S2 normal and normal heart sounds. Exam reveals no gallop.  No murmur heard. Pulses:      Dorsalis pedis pulses are 1+ on the right side and 1+ on the left side.       Posterior tibial pulses are 1+ on the right side and 1+ on the left side.  Pulmonary/Chest: Effort normal. No respiratory distress. He has no wheezes.  Abdominal: He exhibits no distension. There is no abdominal tenderness. There is no guarding and no CVA tenderness.  Musculoskeletal:        General: No edema.     Right shoulder: He exhibits no swelling and no deformity.  Neurological: He is alert and oriented to person, place, and time. He has normal strength and normal reflexes. No cranial nerve deficit or sensory deficit. Gait normal.  Skin: Skin is warm and dry. No rash noted. No cyanosis. Nails show no clubbing.  Psychiatric: He has a normal mood and affect. His speech is normal. Judgment normal. Cognition and memory are normal.    Results for  orders placed or performed in visit on 08/06/18  Comprehensive metabolic panel  Result Value Ref Range   Glucose 95 65 - 99 mg/dL   BUN 20 8 - 27 mg/dL   Creatinine, Ser 1.00 0.76 - 1.27 mg/dL   GFR calc non Af Amer 77 >59 mL/min/1.73   GFR calc Af Amer 89 >59 mL/min/1.73   BUN/Creatinine Ratio 20 10 - 24   Sodium 142 134 - 144 mmol/L   Potassium 4.0 3.5 - 5.2 mmol/L   Chloride 98 96 - 106 mmol/L   CO2 28 20 - 29 mmol/L   Calcium 9.7 8.6 - 10.2 mg/dL   Total Protein 6.7 6.0 - 8.5 g/dL   Albumin 4.5 3.8 - 4.8 g/dL   Globulin, Total 2.2 1.5 - 4.5 g/dL   Albumin/Globulin Ratio 2.0 1.2 - 2.2   Bilirubin Total 0.8 0.0 - 1.2 mg/dL   Alkaline Phosphatase 108 39 - 117 IU/L   AST 22 0 - 40 IU/L   ALT 20 0 - 44 IU/L  Hgb A1c w/o eAG  Result Value Ref Range   Hgb A1c MFr Bld 5.9 (H) 4.8 - 5.6 %  VITAMIN D 25 Hydroxy (Vit-D Deficiency, Fractures)  Result Value Ref Range   Vit D, 25-Hydroxy 51.1 30.0 - 100.0 ng/mL  Vitamin B12  Result Value Ref Range   Vitamin B-12 779 232 - 1,245 pg/mL  Specimen status report  Result Value Ref Range   specimen status report Comment    Diabetic Labs (most recent): Lab Results  Component Value Date   HGBA1C 5.9 (H) 08/06/2018   HGBA1C 7.7 (H) 04/21/2018   HGBA1C 7.3 (H) 10/03/2017     Assessment & Plan:   1. Type 2 diabetes mellitus with vascular disease (HCC)  His diabetes is  complicated by CVA and patient remains at a high risk for more acute and chronic complications of diabetes which include CAD, CVA, CKD, retinopathy, and neuropathy. These are all discussed in detail with the patient. - His current labs showed A1c of 5.9% improving from 7.7%.    -He has lost 10 pounds since last visit, lost approximately 130 pounds related to his Roux-en-Y bypass surgery.    Recent labs reviewed.  - I have re-counseled the patient on diet management and weight loss  by adopting a carbohydrate restricted / protein rich  Diet.  - Patient admits there  is a room  for improvement in his diet and drink choices. -  Suggestion is made for him to avoid simple carbohydrates  from his diet including Cakes, Sweet Desserts / Pastries, Ice Cream, Soda (diet and regular), Sweet Tea, Candies, Chips, Cookies, Store Bought Juices, Alcohol in Excess of  1-2 drinks a day, Artificial Sweeteners, and "Sugar-free" Products. This will help patient to have stable blood glucose profile and potentially avoid unintended weight gain.   - Patient is advised to stick to a routine mealtimes to eat 3 meals  a day and avoid unnecessary snacks ( to snack only to correct hypoglycemia).  - I have approached patient with the following individualized plan to manage diabetes and patient agrees. - After a successful Roux-en-Y bariatric surgery and lost approximately 130 pounds, he was able to come off of multiple daily injections of insulin therapy. -He is advised to lower her metformin to 500 mg p.o. twice daily.   -He he is advised to continue Trulicity 1.5 mg subcutaneously weekly.     - Patient specific target  for A1c; LDL, HDL, Triglycerides, and  Waist Circumference were discussed in detail.  2) BP/HTN: His blood pressure is controlled to target.   He is currently on amlodipine 10 mg p.o. daily, carvedilol 12.5 mg p.o. twice daily, chlorthalidone 1212.5 mg p.o. daily, as well as lisinopril 40 mg p.o. daily.    3) Lipids/HPL: His recent lipid panel showed controlled LDL at 58.  He is advised to continue Lipitor 10 mg p.o. nightly.   4)  Weight/Diet: He is status post Roux-en-Y bariatric surgery. -His vitamin B12 is excessive > 2000, advised to discontinue all vitamin B12 supplements for now and repeat before his next visit.  CDE consult in progress, exercise, and carbohydrates information provided.  5) Chronic Care/Health Maintenance:  -Patient is on ACEI/ARB and Statin medications and encouraged to continue to follow up with Ophthalmology, Podiatrist at least yearly  or according to recommendations, and advised to  stay away from smoking. I have recommended yearly flu vaccine and pneumonia vaccination at least every 5 years; moderate intensity exercise for up to 150 minutes weekly; and  sleep for at least 7 hours a day.   6) Vitamin D deficiency -His vitamin D is replete at 34, currently on ongoing vitamin D3 supplement with 2000 units daily, advised to continue.      - I advised patient to maintain close follow up with his PCP and he is bariatric team for ongoing care needs.  - Time spent with the patient: 25 min, of which >50% was spent in reviewing his  current and  previous labs/studies, previous treatments, and medications doses and developing a plan for long-term care based on the latest recommendations for standards of care. Please refer to " Patient Self Inventory" in the Media  tab for reviewed elements of pertinent patient history. Zachary Murillo. participated in the discussions, expressed understanding, and voiced agreement with the above plans.  All questions were answered to his satisfaction. he is encouraged to contact clinic should he have any questions or concerns prior to his return visit.   Follow up plan: -Return in about 6 months (around 02/09/2019) for Follow up with Pre-visit Labs.  Glade Lloyd, MD Phone: [phone]  Fax: [phone]  -  This note was partially dictated with voice recognition software. Similar sounding words can be transcribed inadequately or may not  be corrected upon review.  08/10/2018, 12:13 PM

## 2018-08-10 NOTE — Patient Instructions (Signed)

## 2018-08-27 ENCOUNTER — Other Ambulatory Visit: Payer: Self-pay | Admitting: "Endocrinology

## 2018-08-27 ENCOUNTER — Telehealth: Payer: Self-pay | Admitting: "Endocrinology

## 2018-08-27 ENCOUNTER — Telehealth: Payer: Self-pay

## 2018-08-27 DIAGNOSIS — E1159 Type 2 diabetes mellitus with other circulatory complications: Secondary | ICD-10-CM

## 2018-08-27 MED ORDER — TRULICITY 1.5 MG/0.5ML ~~LOC~~ SOAJ: SUBCUTANEOUS | 0 refills | Status: DC

## 2018-08-27 NOTE — Telephone Encounter (Signed)
Zachary Murillo, CMA  

## 2018-08-27 NOTE — Telephone Encounter (Signed)
Patient leaving to go out of town.  Can you call in Dulaglutide (TRULICITY) 1.5 JG/9.4JS SOPN [[phone]]  asap

## 2018-08-27 NOTE — Telephone Encounter (Signed)
done

## 2018-09-19 ENCOUNTER — Other Ambulatory Visit: Payer: Self-pay | Admitting: "Endocrinology

## 2018-10-26 ENCOUNTER — Other Ambulatory Visit: Payer: Self-pay | Admitting: "Endocrinology

## 2018-10-26 DIAGNOSIS — E1159 Type 2 diabetes mellitus with other circulatory complications: Secondary | ICD-10-CM

## 2018-11-30 ENCOUNTER — Ambulatory Visit: Payer: Self-pay | Admitting: Orthopedic Surgery

## 2018-12-01 HISTORY — PX: OTHER SURGICAL HISTORY: SHX169

## 2018-12-25 ENCOUNTER — Other Ambulatory Visit: Payer: Self-pay | Admitting: "Endocrinology

## 2018-12-25 DIAGNOSIS — E1159 Type 2 diabetes mellitus with other circulatory complications: Secondary | ICD-10-CM

## 2019-01-19 ENCOUNTER — Other Ambulatory Visit: Payer: Self-pay | Admitting: "Endocrinology

## 2019-02-02 ENCOUNTER — Other Ambulatory Visit: Payer: Self-pay | Admitting: "Endocrinology

## 2019-02-03 LAB — SPECIMEN STATUS REPORT

## 2019-02-03 LAB — COMPREHENSIVE METABOLIC PANEL
ALT: 23 IU/L (ref 0–44)
AST: 24 IU/L (ref 0–40)
Albumin/Globulin Ratio: 1.8 (ref 1.2–2.2)
Albumin: 4.4 g/dL (ref 3.8–4.8)
Alkaline Phosphatase: 151 IU/L — ABNORMAL HIGH (ref 39–117)
BUN/Creatinine Ratio: 22 (ref 10–24)
BUN: 19 mg/dL (ref 8–27)
Bilirubin Total: 0.7 mg/dL (ref 0.0–1.2)
CO2: 27 mmol/L (ref 20–29)
Calcium: 9.5 mg/dL (ref 8.6–10.2)
Chloride: 100 mmol/L (ref 96–106)
Creatinine, Ser: 0.85 mg/dL (ref 0.76–1.27)
GFR calc Af Amer: 103 mL/min/{1.73_m2} (ref >59)
GFR calc non Af Amer: 89 mL/min/{1.73_m2} (ref >59)
Globulin, Total: 2.5 g/dL (ref 1.5–4.5)
Glucose: 125 mg/dL — ABNORMAL HIGH (ref 65–99)
Potassium: 4 mmol/L (ref 3.5–5.2)
Sodium: 142 mmol/L (ref 134–144)
Total Protein: 6.9 g/dL (ref 6.0–8.5)

## 2019-02-03 LAB — HGB A1C W/O EAG: Hgb A1c MFr Bld: 6.4 % — ABNORMAL HIGH (ref 4.8–5.6)

## 2019-02-09 ENCOUNTER — Encounter: Payer: Self-pay | Admitting: "Endocrinology

## 2019-02-09 ENCOUNTER — Other Ambulatory Visit: Payer: Self-pay

## 2019-02-09 ENCOUNTER — Ambulatory Visit (INDEPENDENT_AMBULATORY_CARE_PROVIDER_SITE_OTHER): Payer: Medicare Other | Admitting: "Endocrinology

## 2019-02-09 DIAGNOSIS — E1159 Type 2 diabetes mellitus with other circulatory complications: Secondary | ICD-10-CM | POA: Diagnosis not present

## 2019-02-09 DIAGNOSIS — E782 Mixed hyperlipidemia: Secondary | ICD-10-CM

## 2019-02-09 DIAGNOSIS — I10 Essential (primary) hypertension: Secondary | ICD-10-CM | POA: Diagnosis not present

## 2019-02-09 MED ORDER — METFORMIN HCL 500 MG PO TABS: ORAL_TABLET | ORAL | 1 refills | Status: DC

## 2019-02-09 MED ORDER — TRULICITY 1.5 MG/0.5ML ~~LOC~~ SOAJ: 1.5000 mg | SUBCUTANEOUS | 6 refills | Status: DC

## 2019-02-09 NOTE — Progress Notes (Signed)
02/09/2019                                                    Endocrinology Telehealth Visit Follow up Note -During COVID -19 Pandemic  This visit type was conducted due to national recommendations for restrictions regarding the COVID-19 Pandemic  in an effort to limit this patient's exposure and mitigate transmission of the corona virus.  Due to his co-morbid illnesses, Zachary Murillo. is at  moderate to high risk for complications without adequate follow up.  This format is felt to be most appropriate for him at this time.  I connected with this patient on 02/09/2019   by telephone and verified that I am speaking with the correct person using two identifiers. Zachary Murillo., September 29, 1950. he has verbally consented to this visit. All issues noted in this document were discussed and addressed. The format was not optimal for physical exam.   Subjective:    Patient ID: Zachary Murillo., male    DOB: Dec 18, 1950, PCP Sinda Du, MD   Past Medical History:  Diagnosis Date  . Diabetes mellitus ([place])   . History of Roux-en-Y gastric bypass   . Hypertension   . Overweight(278.02)   . Sleep apnea   . Stroke [facility])    Past Surgical History:  Procedure Laterality Date  . BACK SURGERY    . CARDIAC CATHETERIZATION    . KNEE ARTHROSCOPY    . NASAL SEPTUM SURGERY     Social History   Socioeconomic History  . Marital status: Married    Spouse name: Not on file  . Number of children: Not on file  . Years of education: Masters  . Highest education level: Not on file  Occupational History  . Not on file  Social Needs  . Financial resource strain: Not on file  . Food insecurity    Worry: Not on file    Inability: Not on file  . Transportation needs    Medical: Not on file    Non-medical: Not on file  Tobacco Use  . Smoking status: Never Smoker  . Smokeless tobacco: Never Used  Substance and Sexual Activity  . Alcohol use: Yes    Comment: occ  . Drug use: No  . Sexual  activity: Not Currently  Lifestyle  . Physical activity    Days per week: Not on file    Minutes per session: Not on file  . Stress: Not on file  Relationships  . Social Herbalist on phone: Not on file    Gets together: Not on file    Attends religious service: Not on file    Active member of club or organization: Not on file    Attends meetings of clubs or organizations: Not on file    Relationship status: Not on file  Other Topics Concern  . Not on file  Social History Narrative  . Not on file   Outpatient Encounter Medications as of 02/09/2019  Medication Sig  . amLODipine (NORVASC) 10 MG tablet Take 10 mg by mouth daily.  Marland Kitchen atorvastatin (LIPITOR) 10 MG tablet TAKE (1) TABLET BY MOUTH AT BEDTIME.  . Calcium Citrate-Vitamin D (CALCIUM + D PO) Take by mouth 2 (two) times daily.  . carvedilol (COREG) 25 MG tablet Take 12.5 mg by  mouth 2 (two) times daily with a meal.   . cetirizine (ZYRTEC) 10 MG tablet Take 10 mg by mouth daily as needed for allergies.   . chlorthalidone (HYGROTON) 25 MG tablet Take 12.5 mg by mouth daily.   . Cholecalciferol (VITAMIN D) 2000 units CAPS Take by mouth.  . clopidogrel (PLAVIX) 75 MG tablet Take 1 tablet (75 mg total) by mouth daily.  Marland Kitchen docusate sodium (COLACE) 100 MG capsule Take 100 mg by mouth 2 (two) times daily.  . Dulaglutide (TRULICITY) 1.5 0000000 SOPN Inject 1.5 mg into the skin once a week.  Marland Kitchen lisinopril (PRINIVIL,ZESTRIL) 40 MG tablet Take 40 mg by mouth every morning.  . metFORMIN (GLUCOPHAGE) 500 MG tablet TAKE 1 TABLET BY MOUTH TWICE DAILY WITH A MEAL.  . Multiple Vitamins-Minerals (CENTRUM PO) Take 1 tablet by mouth every morning.  . sertraline (ZOLOFT) 50 MG tablet Take 1 tablet (50 mg total) by mouth daily.  . [DISCONTINUED] aspirin EC 81 MG tablet Take 81 mg by mouth once.   . [DISCONTINUED] clopidogrel (PLAVIX) 75 MG tablet daily.  . [DISCONTINUED] metFORMIN (GLUCOPHAGE) 500 MG tablet TAKE 1 TABLET BY MOUTH TWICE DAILY  WITH A MEAL.  . [DISCONTINUED] TRULICITY 1.5 0000000 SOPN INJECT 1 PEN INTO THE SKIN ONCE PER WEEK.   No facility-administered encounter medications on file as of 02/09/2019.    ALLERGIES: Allergies  Allergen Reactions  . Prednisone     Starts itching   VACCINATION STATUS: Immunization History  Administered Date(s) Administered  . Influenza, High Dose Seasonal PF 11/29/2016  . Influenza, Seasonal, Injecte, Preservative Fre 12/27/2012  . Influenza,inj,Quad PF,6+ Mos 01/06/2015  . Influenza-Unspecified 12/23/2013, 12/25/2015  . Zoster 04/25/2014    Diabetes He presents for his follow-up diabetic visit. He has type 2 diabetes mellitus. Onset time: He was diagnosed at approximate age of 33 years. His disease course has been stable. There are no hypoglycemic associated symptoms. Pertinent negatives for hypoglycemia include no confusion, headaches, pallor or seizures. Pertinent negatives for diabetes include no chest pain, no fatigue, no polydipsia, no polyphagia, no polyuria and no weakness. There are no hypoglycemic complications. Symptoms are improving. Diabetic complications include a CVA. Risk factors for coronary artery disease include diabetes mellitus, dyslipidemia, hypertension, male sex, obesity and sedentary lifestyle. Current diabetic treatment includes oral agent (dual therapy) and oral agent (monotherapy). He is compliant with treatment all of the time. Weight trend:  he maintains 130 pounds of weight loss status post gastric bypass. He is following a generally unhealthy diet. He has had a previous visit with a dietitian. He participates in exercise intermittently. An ACE inhibitor/angiotensin II receptor blocker is being taken. Eye exam is current.  Hyperlipidemia This is a chronic problem. The current episode started more than 1 year ago. Pertinent negatives include no chest pain, myalgias or shortness of breath. Current antihyperlipidemic treatment includes statins. Risk factors  for coronary artery disease include diabetes mellitus, dyslipidemia, hypertension, male sex, a sedentary lifestyle, obesity and family history.  Hypertension This is a chronic problem. The current episode started more than 1 year ago. The problem is uncontrolled. Pertinent negatives include no chest pain, headaches, neck pain, palpitations or shortness of breath. Risk factors for coronary artery disease include diabetes mellitus, dyslipidemia, male gender and obesity. Hypertensive end-organ damage includes CVA. Identifiable causes of hypertension include hyperaldosteronism.   Review of systems: Limited as above.  Objective:    There were no vitals taken for this visit.  Wt Readings from Last 3 Encounters:  08/10/18  227 lb (103 kg)  06/07/18 225 lb (102.1 kg)  04/30/18 237 lb (107.5 kg)      Results for orders placed or performed in visit on 02/02/19  Comprehensive metabolic panel  Result Value Ref Range   Glucose 125 (H) 65 - 99 mg/dL   BUN 19 8 - 27 mg/dL   Creatinine, Ser 0.85 0.76 - 1.27 mg/dL   GFR calc non Af Amer 89 >59 mL/min/1.73   GFR calc Af Amer 103 >59 mL/min/1.73   BUN/Creatinine Ratio 22 10 - 24   Sodium 142 134 - 144 mmol/L   Potassium 4.0 3.5 - 5.2 mmol/L   Chloride 100 96 - 106 mmol/L   CO2 27 20 - 29 mmol/L   Calcium 9.5 8.6 - 10.2 mg/dL   Total Protein 6.9 6.0 - 8.5 g/dL   Albumin 4.4 3.8 - 4.8 g/dL   Globulin, Total 2.5 1.5 - 4.5 g/dL   Albumin/Globulin Ratio 1.8 1.2 - 2.2   Bilirubin Total 0.7 0.0 - 1.2 mg/dL   Alkaline Phosphatase 151 (H) 39 - 117 IU/L   AST 24 0 - 40 IU/L   ALT 23 0 - 44 IU/L  Hgb A1c w/o eAG  Result Value Ref Range   Hgb A1c MFr Bld 6.4 (H) 4.8 - 5.6 %  Specimen status report  Result Value Ref Range   specimen status report Comment    Diabetic Labs (most recent): Lab Results  Component Value Date   HGBA1C 6.4 (H) 02/02/2019   HGBA1C 5.9 (H) 08/06/2018   HGBA1C 7.7 (H) 04/21/2018     Assessment & Plan:   1. Type 2  diabetes mellitus with vascular disease (HCC)  His diabetes is  complicated by CVA and patient remains at a high risk for more acute and chronic complications of diabetes which include CAD, CVA, CKD, retinopathy, and neuropathy. These are all discussed in detail with the patient. - His previsit labs show A1c of 6.4%, slightly up from 5.9%.  No reported hypoglycemia. -He maintains 130 pounds of weight loss status post   his Roux-en-Y bypass surgery.    Recent labs reviewed.  - I have re-counseled the patient on diet management and weight loss  by adopting a carbohydrate restricted / protein rich  Diet.  - he  admits there is a room for improvement in his diet and drink choices. -  Suggestion is made for him to avoid simple carbohydrates  from his diet including Cakes, Sweet Desserts / Pastries, Ice Cream, Soda (diet and regular), Sweet Tea, Candies, Chips, Cookies, Sweet Pastries,  Store Bought Juices, Alcohol in Excess of  1-2 drinks a day, Artificial Sweeteners, Coffee Creamer, and "Sugar-free" Products. This will help patient to have stable blood glucose profile and potentially avoid unintended weight gain.  - Patient is advised to stick to a routine mealtimes to eat 3 meals  a day and avoid unnecessary snacks ( to snack only to correct hypoglycemia).  - I have approached patient with the following individualized plan to manage diabetes and patient agrees. - After a successful Roux-en-Y bariatric surgery and lost approximately 130 pounds, he was able to come off of multiple daily injections of insulin therapy. -He is advised to continue Metformin 500 mg p.o. twice a day-daily after breakfast and after supper.   -He will also continue to benefit from Trulicity treatment.  He is advised to continue Trulicity 1.5 mg subcutaneously weekly.     - Patient specific target  for A1c; LDL, HDL,  Triglycerides, and  Waist Circumference were discussed in detail.  2) BP/HTN: he is advised to home monitor  blood pressure and report if > 140/90 on 2 separate readings.  He is currently on amlodipine 10 mg p.o. daily, carvedilol 12.5 mg p.o. twice daily, chlorthalidone 1212.5 mg p.o. daily, as well as lisinopril 40 mg p.o. daily.    3) Lipids/HPL: His recent lipid panel showed controlled LDL at 58.  He is advised to continue Lipitor 10 mg p.o. nightly.     4)  Weight/Diet: He is status post Roux-en-Y bariatric surgery. -His vitamin B12 is excessive > 2000, advised to discontinue all vitamin B12 supplements for now and repeat before his next visit.  CDE consult in progress, exercise, and carbohydrates information provided.  5) Chronic Care/Health Maintenance:  -Patient is on ACEI/ARB and Statin medications and encouraged to continue to follow up with Ophthalmology, Podiatrist at least yearly or according to recommendations, and advised to  stay away from smoking. I have recommended yearly flu vaccine and pneumonia vaccination at least every 5 years; moderate intensity exercise for up to 150 minutes weekly; and  sleep for at least 7 hours a day.   6) Vitamin D deficiency -His vitamin D is replete at 73, currently on ongoing vitamin D3 supplement with 2000 units daily, advised to continue.      - I advised patient to maintain close follow up with his PCP and he is bariatric team for ongoing care needs.  - Patient Care Time Today:  25 min, of which >50% was spent in  counseling and the rest reviewing his  current and  previous labs/studies, previous treatments, his blood glucose readings, and medications' doses and developing a plan for long-term care based on the latest recommendations for standards of care.   Zachary Murillo. participated in the discussions, expressed understanding, and voiced agreement with the above plans.  All questions were answered to his satisfaction. he is encouraged to contact clinic should he have any questions or concerns prior to his return visit.   Follow up  plan: -Return in about 6 months (around 08/10/2019) for Next Visit A1c in Office.  Glade Lloyd, MD Phone: [phone]  Fax: [phone]  -  This note was partially dictated with voice recognition software. Similar sounding words can be transcribed inadequately or may not  be corrected upon review.  02/09/2019, 10:17 AM

## 2019-03-03 ENCOUNTER — Ambulatory Visit: Payer: Medicare Other | Admitting: Orthopedic Surgery

## 2019-03-03 ENCOUNTER — Other Ambulatory Visit: Payer: Self-pay

## 2019-03-03 ENCOUNTER — Ambulatory Visit: Payer: Medicare Other

## 2019-03-03 VITALS — BP 134/81 | HR 70 | Temp 97.0°F | Ht 70.0 in | Wt 224.0 lb

## 2019-03-03 DIAGNOSIS — M1711 Unilateral primary osteoarthritis, right knee: Secondary | ICD-10-CM

## 2019-03-03 DIAGNOSIS — M1710 Unilateral primary osteoarthritis, unspecified knee: Secondary | ICD-10-CM

## 2019-03-03 NOTE — Progress Notes (Signed)
Routine follow-up right knee pain  68 year old male with diabetes status post gastric bypass hypertension sleep apnea heart disease presents for reevaluation of right knee after 1year having been seen.  He has osteoarthritis medial compartment grade 2/3 disease on x-ray  Reports that he is walking 2-1/2 miles a day he is in a unloader brace but he was wearing it upside down  He says he has mild discomfort in the knee but is otherwise functioning very well  Review of systems he still has pain in his right shoulder that was previously evaluated and thought to need replacement although he is not ready to do that at this time  Physical Exam Vitals and nursing note reviewed.  Constitutional:      Appearance: Normal appearance.  Musculoskeletal:     Right knee: Crepitus present. No swelling, deformity, effusion, erythema, ecchymosis, lacerations or bony tenderness. Normal range of motion. No tenderness. No ACL laxity or PCL laxity. Normal alignment, normal meniscus and normal patellar mobility.  Neurological:     Mental Status: He is alert and oriented to person, place, and time.  Psychiatric:        Mood and Affect: Mood normal.     X-ray was taken today and it shows that compared to his last x-ray he has no progression of medial compartment arthrosis  Recommend continue bracing   Encounter Diagnosis  Name Primary?  Marland Kitchen Arthritis of knee Yes     X-ray again in a year follow-up sooner if any problems occur

## 2019-04-17 ENCOUNTER — Other Ambulatory Visit: Payer: Self-pay | Admitting: "Endocrinology

## 2019-04-28 ENCOUNTER — Other Ambulatory Visit: Payer: Self-pay | Admitting: "Endocrinology

## 2019-05-06 ENCOUNTER — Encounter: Payer: Self-pay | Admitting: Family Medicine

## 2019-06-07 ENCOUNTER — Encounter: Payer: Self-pay | Admitting: Orthopedic Surgery

## 2019-06-07 ENCOUNTER — Ambulatory Visit: Payer: Medicare PPO

## 2019-06-07 ENCOUNTER — Ambulatory Visit: Payer: Medicare PPO | Admitting: Orthopedic Surgery

## 2019-06-07 ENCOUNTER — Other Ambulatory Visit: Payer: Self-pay

## 2019-06-07 VITALS — BP 126/77 | HR 67 | Ht 70.0 in | Wt 220.0 lb

## 2019-06-07 DIAGNOSIS — M25551 Pain in right hip: Secondary | ICD-10-CM

## 2019-06-07 DIAGNOSIS — M5410 Radiculopathy, site unspecified: Secondary | ICD-10-CM

## 2019-06-07 NOTE — Progress Notes (Signed)
Zachary Murillo.  06/07/2019  Body mass index is 31.57 kg/m.   New problem  HISTORY SECTION :  Chief Complaint  Patient presents with  . Leg Pain    pain from lateral right hip down entire right leg    Zachary Murillo 69 years old had lumbar fusion lower back presents with right buttock pain radiating down his right leg to just below the knee patient denies groin or anterior thigh pain.  Pain did limit him significantly in terms of his ability to walk but is subsequently slightly improved    Review of Systems  Constitutional: Negative for chills and fever.  Respiratory: Negative for shortness of breath.   Cardiovascular: Negative for chest pain.  Neurological: Negative for tingling, sensory change and focal weakness.       Right leg will occasionally give out     has a past medical history of Diabetes mellitus ([place]), History of Roux-en-Y gastric bypass, Hypertension, Overweight(278.02), Sleep apnea, and Stroke ([place]).   Past Surgical History:  Procedure Laterality Date  . BACK SURGERY    . CARDIAC CATHETERIZATION    . KNEE ARTHROSCOPY    . NASAL SEPTUM SURGERY      Body mass index is 31.57 kg/m.   Allergies  Allergen Reactions  . Prednisone     Starts itching     Current Outpatient Medications:  .  amLODipine (NORVASC) 10 MG tablet, Take 10 mg by mouth daily., Disp: , Rfl:  .  atorvastatin (LIPITOR) 10 MG tablet, TAKE (1) TABLET BY MOUTH AT BEDTIME., Disp: 90 tablet, Rfl: 0 .  Calcium Citrate-Vitamin D (CALCIUM + D PO), Take by mouth 2 (two) times daily., Disp: , Rfl:  .  carvedilol (COREG) 25 MG tablet, Take 12.5 mg by mouth 2 (two) times daily with a meal. , Disp: , Rfl:  .  cetirizine (ZYRTEC) 10 MG tablet, Take 10 mg by mouth daily as needed for allergies. , Disp: , Rfl:  .  Cholecalciferol (VITAMIN D) 2000 units CAPS, Take by mouth., Disp: , Rfl:  .  clopidogrel (PLAVIX) 75 MG tablet, Take 1 tablet (75 mg total) by mouth daily., Disp: 30 tablet, Rfl: 1 .   docusate sodium (COLACE) 100 MG capsule, Take 100 mg by mouth 2 (two) times daily., Disp: , Rfl:  .  Dulaglutide (TRULICITY) 1.5 0000000 SOPN, Inject 1.5 mg into the skin once a week., Disp: 4 pen, Rfl: 6 .  lisinopril (PRINIVIL,ZESTRIL) 40 MG tablet, Take 40 mg by mouth every morning., Disp: , Rfl:  .  metFORMIN (GLUCOPHAGE) 500 MG tablet, TAKE 1 TABLET BY MOUTH TWICE DAILY WITH A MEAL., Disp: 180 tablet, Rfl: 1 .  Multiple Vitamins-Minerals (CENTRUM PO), Take 1 tablet by mouth every morning., Disp: , Rfl:  .  sertraline (ZOLOFT) 50 MG tablet, Take 1 tablet (50 mg total) by mouth daily., Disp: 30 tablet, Rfl: 12 .  chlorthalidone (HYGROTON) 25 MG tablet, Take 12.5 mg by mouth daily. , Disp: , Rfl:    PHYSICAL EXAM SECTION: 1) BP 126/77   Pulse 67   Ht 5\' 10"  (1.778 m)   Wt 220 lb (99.8 kg)   BMI 31.57 kg/m   Body mass index is 31.57 kg/m. General appearance: Well-developed well-nourished no gross deformities  2) Cardiovascular normal pulse and perfusion , normal color   3) Neurologically deep tendon reflexes are equal and normal, no sensation loss or deficits no pathologic reflexes  4) Psychological: Awake alert and oriented x3 mood and affect  normal  5) Skin no lacerations or ulcerations no nodularity no palpable masses, no erythema or nodularity  6) Musculoskeletal:   Tenderness is in the right buttock his back is nontender he has no pain with range of motion of his left or right hip although he has decreased internal rotation of both hips.  Straight leg raise against resistance was normal   MEDICAL DECISION MAKING  A.  Encounter Diagnoses  Name Primary?  . Pain in right hip Yes  . Radicular pain of right lower extremity     B. DATA ANALYSED:  IMAGING: Independent interpretation of images: Office films show previous spine fusion with some degeneration of level above the fusion  Mild joint space narrowing both hip joints Orders:   Outside records reviewed:   C.  MANAGEMENT   Despite having some mild arthritis in both hips his physical examination reveals pain is coming from his Lumbar spine as is the weakness  Recommend spine referral and follow-up No orders of the defined types were placed in this encounter.     Arther Abbott, MD  06/07/2019 9:50 AM

## 2019-06-29 ENCOUNTER — Ambulatory Visit (INDEPENDENT_AMBULATORY_CARE_PROVIDER_SITE_OTHER): Payer: Medicare PPO | Admitting: Family Medicine

## 2019-06-29 ENCOUNTER — Other Ambulatory Visit: Payer: Self-pay

## 2019-06-29 ENCOUNTER — Encounter: Payer: Self-pay | Admitting: Family Medicine

## 2019-06-29 VITALS — BP 132/82 | Temp 96.9°F | Resp 15 | Ht 70.0 in | Wt 225.0 lb

## 2019-06-29 DIAGNOSIS — I5042 Chronic combined systolic (congestive) and diastolic (congestive) heart failure: Secondary | ICD-10-CM

## 2019-06-29 DIAGNOSIS — E6609 Other obesity due to excess calories: Secondary | ICD-10-CM | POA: Diagnosis not present

## 2019-06-29 DIAGNOSIS — E1159 Type 2 diabetes mellitus with other circulatory complications: Secondary | ICD-10-CM | POA: Diagnosis not present

## 2019-06-29 DIAGNOSIS — Z6832 Body mass index (BMI) 32.0-32.9, adult: Secondary | ICD-10-CM

## 2019-06-29 DIAGNOSIS — E782 Mixed hyperlipidemia: Secondary | ICD-10-CM

## 2019-06-29 DIAGNOSIS — I10 Essential (primary) hypertension: Secondary | ICD-10-CM | POA: Diagnosis not present

## 2019-06-29 NOTE — Progress Notes (Signed)
Subjective:  Patient ID: Zachary Shadow., male    DOB: 1951-03-04  Age: 69 y.o. MRN: [id number]  CC:  Chief Complaint  Patient presents with  . Establish Care      HPI  HPI   [name] is a 69 year old male patient who presents today to establish care. Has several providers in the [facility] system.  Predominantly related to eye care and heart care. Has a significant history that includes but is not limited to type 2 diabetes with vascular disease, hypertension, CVA, osteoarthritis of the knee, DDD of the lumbosacral, shoulder pain impingement syndrome, difficulty with walking, vitamin D deficiency, hyperlipidemia, obesity, systolic and diastolic heart failure, sleep apnea uses CPAP malignant hypertension, among others.  Zachary Murillo is here today and is very active in his care.  Is been married for 42 years.  Have 1 daughter who lives in [place] and 2 grandchildren with one on the way.  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  Very pleasant and jovial in conversation.  Has completed the [place] Covid vaccine.   Reports that his next colonoscopy is doing things in a year.  Has been extensively followed for eye care. Last eye exam-[facility] in [place]; transferred to a doctor in [place]. Has had some spots in vision/tumor. There were no changes after away. Has not seen his primary dr due to specialist. 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 Denies having any chest pain shortness of breath or trouble with the loop recorder.  He sees Dr. Dorris Fetch for type 2 diabetes.  He has managed very well with that.  Outside of establishing care he reports no issues or concerns today.  Today patient denies signs and symptoms of COVID 19 infection including fever, chills, cough, shortness of breath, and headache. Past Medical, Surgical, Social  History, Allergies, and Medications have been Reviewed.   Past Medical History:  Diagnosis Date  . Acute ischemic stroke ([place]) 02/22/2013  . CHF (congestive heart failure) ([place])   . CVA (cerebral infarction) 01/22/2013  . Diabetes mellitus ([place])   . Difficulty in walking(719.7) 01/25/2013  . History of Roux-en-Y gastric bypass   . Hypertension   . IMPINGEMENT SYNDROME 01/16/2009   Qualifier: Diagnosis of  By: Aline Brochure MD, Dorothyann Peng    . KNEE PAIN 01/16/2009   Qualifier: Diagnosis of  By: Aline Brochure MD, Dorothyann Peng    . Knee pain, right 09/05/2011  . KNEE, ARTHRITIS, DEGEN./OSTEO 01/30/2009   Qualifier: Diagnosis of  By: Aline Brochure MD, Dorothyann Peng    . Lack of coordination 01/22/2013  . Non-ischemic cardiomyopathy ([place])   . Osteoarthritis of knee   . Other obesity due to excess calories 01/30/2015  . Overweight(278.02)   . Pulmonary hypertension ([place])   . 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 12/2014, Dr. Flavia Shipper  . S/P lumbar fusion 07/02/2013  . SHOULDER PAIN 01/16/2009   Qualifier: Diagnosis of  By: Aline Brochure MD, Dorothyann Peng    . Shoulder pain, right   . Sleep apnea   . Sleep apnea   . Spinal stenosis   . Stroke ([place])   . TENDINITIS, LEFT KNEE 01/16/2009   Qualifier: Diagnosis of  By: Aline Brochure MD, Dorothyann Peng    . Vitamin D deficiency     Current Meds  Medication Sig  . amLODipine (NORVASC) 10 MG  tablet Take 10 mg by mouth daily.  . Ascorbic Acid (VITAMIN C) 1000 MG tablet Take 1,000 mg by mouth daily.  Marland Kitchen atorvastatin (LIPITOR) 10 MG tablet TAKE (1) TABLET BY MOUTH AT BEDTIME.  . Calcium Citrate-Vitamin D (CALCIUM + D PO) Take by mouth 2 (two) times daily.  . cetirizine (ZYRTEC) 10 MG tablet Take 10 mg by mouth daily as needed for allergies.   . chlorthalidone (HYGROTON) 25 MG tablet Take 12.5 mg by mouth daily.   . Cholecalciferol (VITAMIN D) 2000 units CAPS Take by mouth.  . clopidogrel (PLAVIX) 75 MG tablet Take 1 tablet (75 mg total)  by mouth daily.  Marland Kitchen docusate sodium (COLACE) 100 MG capsule Take 100 mg by mouth 2 (two) times daily.  . Dulaglutide (TRULICITY) 1.5 0000000 SOPN Inject 1.5 mg into the skin once a week.  Marland Kitchen lisinopril (PRINIVIL,ZESTRIL) 40 MG tablet Take 40 mg by mouth every morning.  . metFORMIN (GLUCOPHAGE) 500 MG tablet TAKE 1 TABLET BY MOUTH TWICE DAILY WITH A MEAL.  . Multiple Vitamins-Minerals (CENTRUM PO) Take 1 tablet by mouth every morning.  . sertraline (ZOLOFT) 50 MG tablet Take 1 tablet (50 mg total) by mouth daily.  Marland Kitchen triamcinolone (NASACORT) 55 MCG/ACT AERO nasal inhaler Place 2 sprays into the nose daily.    ROS:  Review of Systems  Constitutional: Negative.   HENT: Negative.   Eyes: Negative.   Respiratory: Negative.   Cardiovascular: Negative.   Gastrointestinal: Negative.   Genitourinary: Negative.   Musculoskeletal: Negative.   Skin: Negative.   Neurological: Negative.   Endo/Heme/Allergies: Negative.   Psychiatric/Behavioral: Negative.   All other systems reviewed and are negative.    Objective:   Today's Vitals: BP 132/82   Temp (!) 96.9 F (36.1 C) (Temporal)   Resp 15   Ht 5\' 10"  (1.778 m)   Wt 225 lb (102.1 kg)   SpO2 96%   BMI 32.28 kg/m  Vitals with BMI 06/29/2019 06/07/2019 03/03/2019  Height 5\' 10"  5\' 10"  5\' 10"   Weight 225 lbs 220 lbs 224 lbs  BMI 32.28 123XX123 Q000111Q  Systolic Q000111Q 123XX123 Q000111Q  Diastolic 82 77 81  Pulse - 67 70     Physical Exam Vitals and nursing note reviewed.  Constitutional:      Appearance: Normal appearance. He is well-developed and well-groomed. He is obese.  HENT:     Head: Normocephalic and atraumatic.     Right Ear: External ear normal.     Left Ear: External ear normal.     Mouth/Throat:     Comments: Mask in place Eyes:     General:        Right eye: No discharge.        Left eye: No discharge.     Conjunctiva/sclera: Conjunctivae normal.  Cardiovascular:     Rate and Rhythm: Normal rate and regular rhythm.     Pulses:  Normal pulses.     Heart sounds: Normal heart sounds.  Pulmonary:     Effort: Pulmonary effort is normal.     Breath sounds: Normal breath sounds.  Musculoskeletal:        General: Normal range of motion.     Cervical back: Normal range of motion and neck supple.  Skin:    General: Skin is warm.  Neurological:     General: No focal deficit present.     Mental Status: He is alert and oriented to person, place, and time.  Psychiatric:  Attention and Perception: Attention normal.        Mood and Affect: Mood normal.        Speech: Speech normal.        Behavior: Behavior normal. Behavior is cooperative.        Thought Content: Thought content normal.        Cognition and Memory: Cognition normal.        Judgment: Judgment normal.     Assessment   1. Type 2 diabetes mellitus with vascular disease ([place])   2. Essential hypertension, benign   3. Class 1 obesity due to excess calories with serious comorbidity and body mass index (BMI) of 32.0 to 32.9 in adult   4. Mixed hyperlipidemia   5. Heart failure, systolic and diastolic, chronic ([place])     Tests ordered No orders of the defined types were placed in this encounter.    Plan: Please see assessment and plan per problem list above.   No orders of the defined types were placed in this encounter.   Patient to follow-up in 3 months  Perlie Mayo, NP

## 2019-06-29 NOTE — Patient Instructions (Addendum)
I appreciate the opportunity to provide you with care for your health and wellness. Today we discussed: established care  Follow up: 3 months   No labs or referrals today  Happy Spring :) And congratulations on the 3rd expecting bundle of joy for your family tree.  Please continue to practice social distancing to keep you, your family, and our community safe.  If you must go out, please wear a mask and practice good handwashing.  It was a pleasure to see you and I look forward to continuing to work together on your health and well-being. Please do not hesitate to call the office if you need care or have questions about your care.  Have a wonderful day and week. With Gratitude, Cherly Beach, DNP, AGNP-BC

## 2019-06-30 ENCOUNTER — Encounter: Payer: Self-pay | Admitting: Family Medicine

## 2019-06-30 DIAGNOSIS — H903 Sensorineural hearing loss, bilateral: Secondary | ICD-10-CM | POA: Insufficient documentation

## 2019-06-30 NOTE — Assessment & Plan Note (Signed)
Is followed by Dr. Dorris Fetch continue current treatment plan.  Encouraged to make sure that he maintains a diabetic friendly diet.  And continue all medications that is prescribed.

## 2019-06-30 NOTE — Assessment & Plan Note (Signed)
Zachary Murillo. is educated about the importance of exercise daily to help with weight management. A minumum of 30 minutes daily is recommended. Additionally, importance of healthy food choices  with portion control discussed.  Wt Readings from Last 3 Encounters:  06/29/19 225 lb (102.1 kg)  06/07/19 220 lb (99.8 kg)  03/03/19 224 lb (101.6 kg)

## 2019-06-30 NOTE — Assessment & Plan Note (Signed)
Followed at [facility] 

## 2019-06-30 NOTE — Assessment & Plan Note (Signed)
Zachary Murillo. is encouraged to maintain a well balanced diet that is low in salt. Controlled, continue current medication regimen.   Additionally, he is also reminded that exercise is beneficial for heart health and control of  Blood pressure. 30-60 minutes daily is recommended-walking was suggested.

## 2019-06-30 NOTE — Assessment & Plan Note (Signed)
Encourage low-fat diet, will get labs in future.

## 2019-08-10 ENCOUNTER — Ambulatory Visit: Payer: Medicare Other | Admitting: Family Medicine

## 2019-08-12 ENCOUNTER — Other Ambulatory Visit: Payer: Self-pay

## 2019-08-12 ENCOUNTER — Encounter: Payer: Self-pay | Admitting: "Endocrinology

## 2019-08-12 ENCOUNTER — Ambulatory Visit (INDEPENDENT_AMBULATORY_CARE_PROVIDER_SITE_OTHER): Payer: Medicare PPO | Admitting: "Endocrinology

## 2019-08-12 VITALS — BP 118/79 | HR 81 | Ht 70.0 in | Wt 224.8 lb

## 2019-08-12 DIAGNOSIS — I10 Essential (primary) hypertension: Secondary | ICD-10-CM

## 2019-08-12 DIAGNOSIS — E782 Mixed hyperlipidemia: Secondary | ICD-10-CM

## 2019-08-12 DIAGNOSIS — E1159 Type 2 diabetes mellitus with other circulatory complications: Secondary | ICD-10-CM

## 2019-08-12 LAB — POCT GLYCOSYLATED HEMOGLOBIN (HGB A1C): Hemoglobin A1C: 6.9 % — AB (ref 4.0–5.6)

## 2019-08-12 MED ORDER — METFORMIN HCL 500 MG PO TABS: ORAL_TABLET | ORAL | 1 refills | Status: DC

## 2019-08-12 NOTE — Progress Notes (Signed)
08/12/2019  Endocrinology follow-up note   Subjective:    Patient ID: Zachary Murillo., male    DOB: 22-Dec-1950, PCP Johna Roles, PA   Past Medical History:  Diagnosis Date  . Acute ischemic stroke ([place]) 02/22/2013  . CHF (congestive heart failure) ([place])   . CVA (cerebral infarction) 01/22/2013  . Diabetes mellitus ([place])   . Difficulty in walking(719.7) 01/25/2013  . History of Roux-en-Y gastric bypass   . Hypertension   . IMPINGEMENT SYNDROME 01/16/2009   Qualifier: Diagnosis of  By: Aline Brochure MD, Dorothyann Peng    . KNEE PAIN 01/16/2009   Qualifier: Diagnosis of  By: Aline Brochure MD, Dorothyann Peng    . Knee pain, right 09/05/2011  . KNEE, ARTHRITIS, DEGEN./OSTEO 01/30/2009   Qualifier: Diagnosis of  By: Aline Brochure MD, Dorothyann Peng    . Lack of coordination 01/22/2013  . Non-ischemic cardiomyopathy ([place])   . Osteoarthritis of knee   . Other obesity due to excess calories 01/30/2015  . Overweight(278.02)   . Pulmonary hypertension ([place])   . 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 12/2014, Dr. Flavia Shipper  . S/P lumbar fusion 07/02/2013  . SHOULDER PAIN 01/16/2009   Qualifier: Diagnosis of  By: Aline Brochure MD, Dorothyann Peng    . Shoulder pain, right   . Sleep apnea   . Sleep apnea   . Spinal stenosis   . Stroke ([place])   . TENDINITIS, LEFT KNEE 01/16/2009   Qualifier: Diagnosis of  By: Aline Brochure MD, Dorothyann Peng    . Vitamin D deficiency    Past Surgical History:  Procedure Laterality Date  . BACK SURGERY    . CARDIAC CATHETERIZATION    . KNEE ARTHROSCOPY    . NASAL SEPTUM SURGERY     Social History   Socioeconomic History  . Marital status: Married    Spouse name: Otila Kluver   . Number of children: 1  . Years of education: Masters  . Highest education level: Not on file  Occupational History  . Occupation: Retired-Drivers Ed   Tobacco Use  . Smoking status: Never Smoker  . Smokeless tobacco: Never Used  Substance and Sexual Activity  . Alcohol  use: Yes    Comment: occ  . Drug use: No  . Sexual activity: Yes    Birth control/protection: Surgical  Other Topics Concern  . 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 Difficulty of Paying Living Expenses:   Food Insecurity:   . Worried About Charity fundraiser in the Last Year:   . Arboriculturist in the Last Year:   Transportation Needs:   . Film/video editor (Medical):   Marland Kitchen Lack of Transportation (Non-Medical):   Physical Activity:   . Days of Exercise per Week:   . Minutes of Exercise per Session:   Stress:   . Feeling of  Stress :   Social Connections:   . Frequency of Communication with Friends and Family:   . Frequency of Social Gatherings with Friends and Family:   . Attends Religious Services:   . Active Member of Clubs or Organizations:   . Attends Archivist Meetings:   Marland Kitchen Marital Status:    Outpatient Encounter Medications as of 08/12/2019  Medication Sig  . amLODipine (NORVASC) 10 MG tablet Take 10 mg by mouth daily.  . Ascorbic Acid (VITAMIN C) 1000 MG tablet Take 1,000 mg by mouth daily.  Marland Kitchen atorvastatin (LIPITOR) 10 MG tablet TAKE (1) TABLET BY MOUTH AT BEDTIME.  . Calcium Citrate-Vitamin D (CALCIUM + D PO) Take by mouth 2 (two) times daily.  . cetirizine (ZYRTEC) 10 MG tablet Take 10 mg by mouth daily as needed for allergies.   . chlorthalidone (HYGROTON) 25 MG tablet Take 12.5 mg by mouth daily.   . Cholecalciferol (VITAMIN D) 2000 units CAPS Take by mouth.  . clopidogrel (PLAVIX) 75 MG  tablet Take 1 tablet (75 mg total) by mouth daily.  Marland Kitchen docusate sodium (COLACE) 100 MG capsule Take 100 mg by mouth 2 (two) times daily.  . Dulaglutide (TRULICITY) 1.5 0000000 SOPN Inject 1.5 mg into the skin once a week.  Marland Kitchen lisinopril (PRINIVIL,ZESTRIL) 40 MG tablet Take 40 mg by mouth every morning.  . metFORMIN (GLUCOPHAGE) 500 MG tablet TAKE 1 TABLET BY MOUTH ONCE AFTER BREAKFAST  . Multiple Vitamins-Minerals (CENTRUM PO) Take 1 tablet by mouth every morning.  . sertraline (ZOLOFT) 50 MG tablet Take 1 tablet (50 mg total) by mouth daily.  Marland Kitchen triamcinolone (NASACORT) 55 MCG/ACT AERO nasal inhaler Place 2 sprays into the nose daily.  . [DISCONTINUED] metFORMIN (GLUCOPHAGE) 500 MG tablet TAKE 1 TABLET BY MOUTH TWICE DAILY WITH A MEAL.   No facility-administered encounter medications on file as of 08/12/2019.   ALLERGIES: Allergies  Allergen Reactions  . Prednisone     Starts itching   VACCINATION STATUS: Immunization History  Administered Date(s) Administered  . Influenza, High Dose Seasonal PF 11/29/2016, 01/20/2018  . Influenza, Seasonal, Injecte, Preservative Fre 12/27/2012  . Influenza,inj,Quad PF,6+ Mos 01/06/2015  . Influenza-Unspecified 12/23/2013, 12/25/2015, 11/19/2018  . PFIZER SARS-COV-2 Vaccination 04/03/2019, 04/24/2019  . Zoster 04/25/2014    Diabetes He presents for his follow-up diabetic visit. He has type 2 diabetes mellitus. Onset time: He was diagnosed at approximate age of 76 years. His disease course has been improving. There are no hypoglycemic associated symptoms. Pertinent negatives for hypoglycemia include no confusion, headaches, pallor or seizures. Pertinent negatives for diabetes include no chest pain, no fatigue, no polydipsia, no polyphagia, no polyuria and no weakness. There are no hypoglycemic complications. Symptoms are improving. Diabetic complications include a CVA. Risk factors for coronary artery disease include diabetes mellitus, dyslipidemia,  hypertension, male sex, obesity and sedentary lifestyle. Current diabetic treatment includes oral agent (dual therapy) and oral agent (monotherapy). He is compliant with treatment all of the time. His weight is decreasing steadily ( he maintains 135 pounds of weight loss status post gastric bypass). He is following a generally unhealthy diet. He has had a previous visit with a dietitian. He participates in exercise intermittently. (He does not monitor blood glucose regularly.  He denies hypoglycemia.) An ACE inhibitor/angiotensin II receptor blocker is being taken. Eye exam is current.  Hyperlipidemia This is a chronic problem. The current episode started more than 1 year ago. Pertinent negatives include no chest pain, myalgias or shortness of breath. Current antihyperlipidemic  treatment includes statins. Risk factors for coronary artery disease include diabetes mellitus, dyslipidemia, hypertension, male sex, a sedentary lifestyle, obesity and family history.  Hypertension This is a chronic problem. The current episode started more than 1 year ago. The problem is uncontrolled. Pertinent negatives include no chest pain, headaches, neck pain, palpitations or shortness of breath. Risk factors for coronary artery disease include diabetes mellitus, dyslipidemia, male gender and obesity. Hypertensive end-organ damage includes CVA. Identifiable causes of hypertension include hyperaldosteronism.    Review of systems  Constitutional: + Minimally fluctuating body weight,  current  Body mass index is 32.26 kg/m. , no fatigue, no subjective hyperthermia, no subjective hypothermia Eyes: no blurry vision, no xerophthalmia ENT: no sore throat, no nodules palpated in throat, no dysphagia/odynophagia, no hoarseness Cardiovascular: no Chest Pain, no Shortness of Breath, no palpitations, no leg swelling Respiratory: no cough, no shortness of breath Gastrointestinal: no Nausea/Vomiting/Diarhhea Musculoskeletal: no  muscle/joint aches Skin: no rashes, no hyperemia Neurological: no tremors, no numbness, no tingling, no dizziness Psychiatric: no depression, no anxiety   Objective:    BP 118/79   Pulse 81   Ht 5\' 10"  (1.778 m)   Wt 224 lb 12.8 oz (102 kg)   BMI 32.26 kg/m   Wt Readings from Last 3 Encounters:  08/12/19 224 lb 12.8 oz (102 kg)  06/29/19 225 lb (102.1 kg)  06/07/19 220 lb (99.8 kg)     Physical Exam- Limited  Constitutional:  Body mass index is 32.26 kg/m. , not in acute distress, normal state of mind Eyes:  EOMI, no exophthalmos Neck: Supple Thyroid: No gross goiter Respiratory: Adequate breathing efforts Musculoskeletal: no gross deformities, strength intact in all four extremities, no gross restriction of joint movements Skin:  no rashes, no hyperemia Neurological: no tremor with outstretched hands,     Results for orders placed or performed in visit on 08/12/19  HgB A1c  Result Value Ref Range   Hemoglobin A1C 6.9 (A) 4.0 - 5.6 %   HbA1c POC (<> result, manual entry)     HbA1c, POC (prediabetic range)     HbA1c, POC (controlled diabetic range)     Diabetic Labs (most recent): Lab Results  Component Value Date   HGBA1C 6.9 (A) 08/12/2019   HGBA1C 6.4 (H) 02/02/2019   HGBA1C 5.9 (H) 08/06/2018   CMP Latest Ref Rng & Units 02/02/2019 08/06/2018 06/07/2018  Glucose 65 - 99 mg/dL 125(H) 95 103(H)  BUN 8 - 27 mg/dL 19 20 34(H)  Creatinine 0.76 - 1.27 mg/dL 0.85 1.00 1.02  Sodium 134 - 144 mmol/L 142 142 138  Potassium 3.5 - 5.2 mmol/L 4.0 4.0 3.8  Chloride 96 - 106 mmol/L 100 98 100  CO2 20 - 29 mmol/L 27 28 27   Calcium 8.6 - 10.2 mg/dL 9.5 9.7 9.7  Total Protein 6.0 - 8.5 g/dL 6.9 6.7 7.7  Total Bilirubin 0.0 - 1.2 mg/dL 0.7 0.8 1.0  Alkaline Phos 39 - 117 IU/L 151(H) 108 103  AST 0 - 40 IU/L 24 22 23   ALT 0 - 44 IU/L 23 20 22    Lipid Panel     Component Value Date/Time   CHOL 111 04/21/2018 0834   TRIG 92 04/21/2018 0834   HDL 35 (L) 04/21/2018 0834    CHOLHDL 2.2 01/04/2016 0745   VLDL 13 01/04/2016 0745   LDLCALC 58 04/21/2018 0834   LABVLDL 18 04/21/2018 0834   Results for NOAN, WITKO (MRN [id number]) as of 08/12/2019 16:33  Ref. Range  04/21/2018 08:34  TSH Latest Ref Range: 0.450 - 4.500 uIU/mL 2.110  T4,Free(Direct) Latest Ref Range: 0.82 - 1.77 ng/dL 1.34    Assessment & Plan:   1. Type 2 diabetes mellitus with vascular disease (HCC)  His diabetes is  complicated by CVA and patient remains at a high risk for more acute and chronic complications of diabetes which include CAD, CVA, CKD, retinopathy, and neuropathy.  He denies hypoglycemia,  point-of-care A1c 6.9%ed . -He maintains 135 pounds of weight loss status post   his Roux-en-Y bypass surgery.    Recent labs reviewed.  - I have re-counseled the patient on diet management and weight loss  by adopting a carbohydrate restricted / protein rich  Diet.  - he  admits there is a room for improvement in his diet and drink choices. -  Suggestion is made for him to avoid simple carbohydrates  from his diet including Cakes, Sweet Desserts / Pastries, Ice Cream, Soda (diet and regular), Sweet Tea, Candies, Chips, Cookies, Sweet Pastries,  Store Bought Juices, Alcohol in Excess of  1-2 drinks a day, Artificial Sweeteners, Coffee Creamer, and "Sugar-free" Products. This will help patient to have stable blood glucose profile and potentially avoid unintended weight gain.   - Patient is advised to stick to a routine mealtimes to eat 3 meals  a day and avoid unnecessary snacks ( to snack only to correct hypoglycemia).  - I have approached patient with the following individualized plan to manage diabetes and patient agrees. - After a successful Roux-en-Y bariatric surgery and lost approximately 135 pounds, he was able to come off of multiple daily injections of insulin therapy. -He is advised to continue Metformin 500 mg p.o. twice daily-daily after breakfast and after supper.   -He  will also continue to benefit from Trulicity treatment.  He is advised to continue Trulicity 1.5 mg subcutaneously weekly.   - Patient specific target  for A1c; LDL, HDL, Triglycerides, and  Waist Circumference were discussed in detail.  2) BP/HTN:  His blood pressure is controlled to target.  He is currently on amlodipine 10 mg p.o. daily, carvedilol 12.5 mg p.o. twice daily, chlorthalidone 12.5 mg p.o. daily, as well as lisinopril 40 mg p.o. daily.    3) Lipids/HPL: His recent lipid panel showed controlled LDL at 58.  He is advised to continue Lipitor 10 mg p.o. nightly.  Marland Kitchen     4)  Weight/Diet: He is status post Roux-en-Y bariatric surgery.  His previsit labs show improved vitamin B12 779, improving from supraphysiologic level of > 2000 prior to last visit.  He remains off of his B12 supplement.  CDE consult in progress, exercise, and carbohydrates information provided.  5) Chronic Care/Health Maintenance:  -Patient is on ACEI/ARB and Statin medications and encouraged to continue to follow up with Ophthalmology, Podiatrist at least yearly or according to recommendations, and advised to  stay away from smoking. I have recommended yearly flu vaccine and pneumonia vaccination at least every 5 years; moderate intensity exercise for up to 150 minutes weekly; and  sleep for at least 7 hours a day.   6) Vitamin D deficiency -His vitamin D is replete at 83, currently on ongoing vitamin D3 supplement with 2000 units daily, advised to continue.      - I advised patient to maintain close follow up with his PCP and he is bariatric team for ongoing care needs.  - Time spent on this patient care encounter:  35 min, of which > 50%  was spent in  counseling and the rest reviewing his blood glucose logs , discussing his hypoglycemia and hyperglycemia episodes, reviewing his current and  previous labs / studies  ( including abstraction from other facilities) and medications  doses and developing a  long  term treatment plan and documenting his care.   Please refer to Patient Instructions for Blood Glucose Monitoring and Insulin/Medications Dosing Guide"  in media tab for additional information. Please  also refer to " Patient Self Inventory" in the Media  tab for reviewed elements of pertinent patient history.  Zachary Murillo. participated in the discussions, expressed understanding, and voiced agreement with the above plans.  All questions were answered to his satisfaction. he is encouraged to contact clinic should he have any questions or concerns prior to his return visit.    Follow up plan: -Return in about 4 months (around 12/12/2019) for Bring Meter and Logs- A1c in Office, [place] Office Urine MA.  Glade Lloyd, MD Phone: [phone]  Fax: [phone]  -  This note was partially dictated with voice recognition software. Similar sounding words can be transcribed inadequately or may not  be corrected upon review.  08/12/2019, 4:30 PM

## 2019-08-12 NOTE — Patient Instructions (Signed)

## 2019-08-26 ENCOUNTER — Telehealth: Payer: Self-pay | Admitting: Orthopedic Surgery

## 2019-08-26 NOTE — Telephone Encounter (Signed)
Call from patient 08/25/19 / call returned, left message.

## 2019-08-26 NOTE — Telephone Encounter (Signed)
Ok for him to sch. With specialist, we use Dr Marlou Sa [phone] is the phone number   I called patient to advise. Left message

## 2019-08-26 NOTE — Telephone Encounter (Signed)
Patient states his right shoulder is hurting again, as it was during the time he was seeing Dr Aline Brochure for this problem. States he was told after Dr Aline Brochure reviewed the MRI (in 2015) that when he was ready, he would need to see shoulder specialist in [place]. Now that his shoulder is bothering him again, asking if he can schedule directly with shoulder specialist, or is he to return here for re-evaluation?

## 2019-09-28 ENCOUNTER — Other Ambulatory Visit: Payer: Self-pay | Admitting: "Endocrinology

## 2019-09-28 DIAGNOSIS — E1159 Type 2 diabetes mellitus with other circulatory complications: Secondary | ICD-10-CM

## 2019-10-07 ENCOUNTER — Ambulatory Visit: Payer: Medicare PPO | Admitting: Family Medicine

## 2019-10-29 DIAGNOSIS — I639 Cerebral infarction, unspecified: Secondary | ICD-10-CM | POA: Diagnosis not present

## 2019-10-29 DIAGNOSIS — Z95818 Presence of other cardiac implants and grafts: Secondary | ICD-10-CM | POA: Diagnosis not present

## 2019-11-10 DIAGNOSIS — J019 Acute sinusitis, unspecified: Secondary | ICD-10-CM | POA: Diagnosis not present

## 2019-11-25 DIAGNOSIS — J302 Other seasonal allergic rhinitis: Secondary | ICD-10-CM | POA: Diagnosis not present

## 2019-11-25 DIAGNOSIS — Z1283 Encounter for screening for malignant neoplasm of skin: Secondary | ICD-10-CM | POA: Diagnosis not present

## 2019-11-25 DIAGNOSIS — D225 Melanocytic nevi of trunk: Secondary | ICD-10-CM | POA: Diagnosis not present

## 2019-11-25 DIAGNOSIS — Z08 Encounter for follow-up examination after completed treatment for malignant neoplasm: Secondary | ICD-10-CM | POA: Diagnosis not present

## 2019-11-25 DIAGNOSIS — Z85820 Personal history of malignant melanoma of skin: Secondary | ICD-10-CM | POA: Diagnosis not present

## 2019-11-26 ENCOUNTER — Other Ambulatory Visit: Payer: Self-pay | Admitting: "Endocrinology

## 2019-11-26 DIAGNOSIS — E1159 Type 2 diabetes mellitus with other circulatory complications: Secondary | ICD-10-CM

## 2019-12-02 DIAGNOSIS — I639 Cerebral infarction, unspecified: Secondary | ICD-10-CM | POA: Diagnosis not present

## 2019-12-02 DIAGNOSIS — Z95818 Presence of other cardiac implants and grafts: Secondary | ICD-10-CM | POA: Diagnosis not present

## 2019-12-08 DIAGNOSIS — H33312 Horseshoe tear of retina without detachment, left eye: Secondary | ICD-10-CM | POA: Diagnosis not present

## 2019-12-08 DIAGNOSIS — D487 Neoplasm of uncertain behavior of other specified sites: Secondary | ICD-10-CM | POA: Diagnosis not present

## 2019-12-09 ENCOUNTER — Other Ambulatory Visit: Payer: Self-pay | Admitting: "Endocrinology

## 2019-12-09 DIAGNOSIS — E1159 Type 2 diabetes mellitus with other circulatory complications: Secondary | ICD-10-CM | POA: Diagnosis not present

## 2019-12-10 LAB — SPECIMEN STATUS REPORT

## 2019-12-10 LAB — COMPREHENSIVE METABOLIC PANEL
ALT: 27 IU/L (ref 0–44)
AST: 24 IU/L (ref 0–40)
Albumin/Globulin Ratio: 1.8 (ref 1.2–2.2)
Albumin: 4.4 g/dL (ref 3.8–4.8)
Alkaline Phosphatase: 129 IU/L — ABNORMAL HIGH (ref 44–121)
BUN/Creatinine Ratio: 25 — ABNORMAL HIGH (ref 10–24)
BUN: 23 mg/dL (ref 8–27)
Bilirubin Total: 1.2 mg/dL (ref 0.0–1.2)
CO2: 26 mmol/L (ref 20–29)
Calcium: 9.4 mg/dL (ref 8.6–10.2)
Chloride: 99 mmol/L (ref 96–106)
Creatinine, Ser: 0.91 mg/dL (ref 0.76–1.27)
GFR calc Af Amer: 99 mL/min/{1.73_m2} (ref >59)
GFR calc non Af Amer: 86 mL/min/{1.73_m2} (ref >59)
Globulin, Total: 2.5 g/dL (ref 1.5–4.5)
Glucose: 119 mg/dL — ABNORMAL HIGH (ref 65–99)
Potassium: 3.7 mmol/L (ref 3.5–5.2)
Sodium: 141 mmol/L (ref 134–144)
Total Protein: 6.9 g/dL (ref 6.0–8.5)

## 2019-12-10 LAB — TSH: TSH: 2.1 u[IU]/mL (ref 0.450–4.500)

## 2019-12-10 LAB — LIPID PANEL W/O CHOL/HDL RATIO
Cholesterol, Total: 122 mg/dL (ref 100–199)
HDL: 47 mg/dL (ref >39)
LDL Chol Calc (NIH): 61 mg/dL (ref 0–99)
Triglycerides: 69 mg/dL (ref 0–149)
VLDL Cholesterol Cal: 14 mg/dL (ref 5–40)

## 2019-12-10 LAB — T4, FREE: Free T4: 1.29 ng/dL (ref 0.82–1.77)

## 2019-12-15 ENCOUNTER — Encounter: Payer: Self-pay | Admitting: Nurse Practitioner

## 2019-12-15 ENCOUNTER — Other Ambulatory Visit: Payer: Self-pay

## 2019-12-15 ENCOUNTER — Ambulatory Visit (INDEPENDENT_AMBULATORY_CARE_PROVIDER_SITE_OTHER): Payer: Medicare PPO | Admitting: Nurse Practitioner

## 2019-12-15 VITALS — BP 143/86 | HR 66 | Ht 70.0 in

## 2019-12-15 DIAGNOSIS — E1159 Type 2 diabetes mellitus with other circulatory complications: Secondary | ICD-10-CM | POA: Diagnosis not present

## 2019-12-15 DIAGNOSIS — I10 Essential (primary) hypertension: Secondary | ICD-10-CM

## 2019-12-15 DIAGNOSIS — E782 Mixed hyperlipidemia: Secondary | ICD-10-CM

## 2019-12-15 LAB — POCT GLYCOSYLATED HEMOGLOBIN (HGB A1C): Hemoglobin A1C: 6.8 % — AB (ref 4.0–5.6)

## 2019-12-15 NOTE — Progress Notes (Signed)
12/15/2019  Endocrinology follow-up note   Subjective:    Patient ID: Zachary Shadow., male    DOB: 1950-09-20, PCP Johna Roles, PA   Past Medical History:  Diagnosis Date  . Acute ischemic stroke ([place]) 02/22/2013  . CHF (congestive heart failure) ([place])   . CVA (cerebral infarction) 01/22/2013  . Diabetes mellitus ([place])   . Difficulty in walking(719.7) 01/25/2013  . History of Roux-en-Y gastric bypass   . Hypertension   . IMPINGEMENT SYNDROME 01/16/2009   Qualifier: Diagnosis of  By: Aline Brochure MD, Dorothyann Peng    . KNEE PAIN 01/16/2009   Qualifier: Diagnosis of  By: Aline Brochure MD, Dorothyann Peng    . Knee pain, right 09/05/2011  . KNEE, ARTHRITIS, DEGEN./OSTEO 01/30/2009   Qualifier: Diagnosis of  By: Aline Brochure MD, Dorothyann Peng    . Lack of coordination 01/22/2013  . Non-ischemic cardiomyopathy ([place])   . Osteoarthritis of knee   . Other obesity due to excess calories 01/30/2015  . Overweight(278.02)   . Pulmonary hypertension ([place])   . 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 12/2014, Dr. Flavia Shipper  . S/P lumbar fusion 07/02/2013  . SHOULDER PAIN 01/16/2009   Qualifier: Diagnosis of  By: Aline Brochure MD, Dorothyann Peng    . Shoulder pain, right   . Sleep apnea   . Sleep apnea   . Spinal stenosis   . Stroke ([place])   . TENDINITIS, LEFT KNEE 01/16/2009   Qualifier: Diagnosis of  By: Aline Brochure MD, Dorothyann Peng    . Vitamin D deficiency    Past Surgical History:  Procedure Laterality Date  . BACK SURGERY    . CARDIAC CATHETERIZATION    . KNEE ARTHROSCOPY    . NASAL SEPTUM SURGERY     Social History   Socioeconomic History  . Marital status: Married    Spouse name: Otila Kluver   . Number of children: 1  . Years of education: Masters  . Highest education level: Not on file  Occupational History  . Occupation: Retired-Drivers Ed   Tobacco Use  . Smoking status: Never Smoker  . Smokeless tobacco: Never Used  Vaping Use  . Vaping Use: Never used   Substance and Sexual Activity  . Alcohol use: Yes    Comment: occ  . Drug use: No  . Sexual activity: Yes    Birth control/protection: Surgical  Other Topics Concern  . 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 Difficulty of Paying Living Expenses: Not on file  Food Insecurity:   . Worried About Charity fundraiser in the Last Year: Not on file  . Ran Out of Food in the Last Year: Not on file  Transportation Needs:   . Lack of Transportation (Medical): Not on file  . Lack of Transportation (Non-Medical): Not on file  Physical Activity:   . Days of  Exercise per Week: Not on file  . Minutes of Exercise per Session: Not on file  Stress:   . Feeling of Stress : Not on file  Social Connections:   . Frequency of Communication with Friends and Family: Not on file  . Frequency of Social Gatherings with Friends and Family: Not on file  . Attends Religious Services: Not on file  . Active Member of Clubs or Organizations: Not on file  . Attends Archivist Meetings: Not on file  . Marital Status: Not on file   Outpatient Encounter Medications as of 12/15/2019  Medication Sig  . amLODipine (NORVASC) 10 MG tablet Take 10 mg by mouth daily.  . Ascorbic Acid (VITAMIN C) 1000 MG tablet Take 1,000 mg by mouth daily.  Marland Kitchen atorvastatin (LIPITOR) 10 MG tablet TAKE (1) TABLET BY MOUTH AT BEDTIME.  . Calcium Citrate-Vitamin D (CALCIUM + D PO) Take by mouth 2 (two) times daily.  . cetirizine (ZYRTEC) 10 MG tablet Take 10 mg by mouth  daily as needed for allergies.   . chlorthalidone (HYGROTON) 25 MG tablet Take 12.5 mg by mouth daily.   . Cholecalciferol (VITAMIN D) 2000 units CAPS Take by mouth.  . clopidogrel (PLAVIX) 75 MG tablet Take 1 tablet (75 mg total) by mouth daily.  Marland Kitchen docusate sodium (COLACE) 100 MG capsule Take 100 mg by mouth 2 (two) times daily.  Marland Kitchen lisinopril (PRINIVIL,ZESTRIL) 40 MG tablet Take 40 mg by mouth every morning.  . metFORMIN (GLUCOPHAGE) 500 MG tablet TAKE 1 TABLET BY MOUTH ONCE AFTER BREAKFAST  . Multiple Vitamins-Minerals (CENTRUM PO) Take 1 tablet by mouth every morning.  . sertraline (ZOLOFT) 50 MG tablet Take 1 tablet (50 mg total) by mouth daily.  Marland Kitchen triamcinolone (NASACORT) 55 MCG/ACT AERO nasal inhaler Place 2 sprays into the nose daily.  . TRULICITY 1.5 ZD/6.6YQ SOPN INJECT 1.5MG  INTO THE SKIN ONCE WEEKLY.   No facility-administered encounter medications on file as of 12/15/2019.   ALLERGIES: Allergies  Allergen Reactions  . Prednisone     Starts itching   VACCINATION STATUS: Immunization History  Administered Date(s) Administered  . Influenza, High Dose Seasonal PF 11/29/2016, 01/20/2018  . Influenza, Seasonal, Injecte, Preservative Fre 12/27/2012  . Influenza,inj,Quad PF,6+ Mos 01/06/2015  . Influenza-Unspecified 12/23/2013, 12/25/2015, 11/19/2018  . PFIZER SARS-COV-2 Vaccination 04/03/2019, 04/24/2019  . Zoster 04/25/2014    Diabetes He presents for his follow-up diabetic visit. He has type 2 diabetes mellitus. Onset time: He was diagnosed at approximate age of 2 years. His disease course has been improving. There are no hypoglycemic associated symptoms. Pertinent negatives for hypoglycemia include no confusion, headaches, pallor or seizures. Pertinent negatives for diabetes include no chest pain, no fatigue, no polydipsia, no polyphagia, no polyuria and no weakness. There are no hypoglycemic complications. Symptoms are improving. Diabetic complications include a CVA and heart  disease. Risk factors for coronary artery disease include diabetes mellitus, dyslipidemia, hypertension, male sex, obesity and sedentary lifestyle. Current diabetic treatment includes oral agent (monotherapy) (Trulicity 1.5 weekly). He is compliant with treatment all of the time. His weight is fluctuating minimally. He is following a generally unhealthy diet. When asked about meal planning, he reported none. He has had a previous visit with a dietitian. He participates in exercise intermittently. (He presents today without his meter or logs.  He does not monitor blood glucose regularly at the advice from MD given his current DM medication regimen.  His POCT A1C today is 6.8%, slightly improving from last visit of  6.9%.) An ACE inhibitor/angiotensin II receptor blocker is being taken. He sees a podiatrist.Eye exam is current.  Hyperlipidemia This is a chronic problem. The current episode started more than 1 year ago. The problem is controlled. Recent lipid tests were reviewed and are normal. Exacerbating diseases include chronic renal disease, diabetes and obesity. Pertinent negatives include no chest pain, myalgias or shortness of breath. Current antihyperlipidemic treatment includes statins. The current treatment provides moderate improvement of lipids. Compliance problems include adherence to exercise.  Risk factors for coronary artery disease include diabetes mellitus, dyslipidemia, hypertension, male sex, a sedentary lifestyle, obesity and family history.  Hypertension This is a chronic problem. The current episode started more than 1 year ago. The problem has been gradually improving since onset. The problem is controlled. Pertinent negatives include no chest pain, headaches, neck pain, palpitations or shortness of breath. There are no associated agents to hypertension. Risk factors for coronary artery disease include diabetes mellitus, dyslipidemia, male gender, obesity and sedentary lifestyle. Past  treatments include ACE inhibitors, diuretics and calcium channel blockers. The current treatment provides moderate improvement. Compliance problems include exercise and diet.  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 Identifiable causes of hypertension include chronic renal disease and hyperaldosteronism.    Review of systems  Constitutional: + Minimally fluctuating body weight (slightly up from last visit),  current Body mass index is 32.26 kg/m. , no fatigue, no subjective hyperthermia, no subjective hypothermia Eyes: no blurry vision, no xerophthalmia ENT: no sore throat, no nodules palpated in throat, no dysphagia/odynophagia, no hoarseness Cardiovascular: no chest pain, no shortness of breath, no palpitations, no leg swelling Respiratory: no cough, no shortness of breath Gastrointestinal: no nausea/vomiting/diarrhea Musculoskeletal: no muscle/joint aches Skin: no rashes, no hyperemia Neurological: no tremors, no numbness, no tingling, no dizziness Psychiatric: no depression, no anxiety   Objective:    BP (!) 143/86 (BP Location: Right Arm, Patient Position: Sitting)   Pulse 66   Ht 5\' 10"  (1.778 m)   BMI 32.26 kg/m   Wt Readings from Last 3 Encounters:  08/12/19 224 lb 12.8 oz (102 kg)  06/29/19 225 lb (102.1 kg)  06/07/19 220 lb (99.8 kg)    BP Readings from Last 3 Encounters:  12/15/19 (!) 143/86  08/12/19 118/79  06/29/19 132/82    Physical Exam- Limited  Constitutional:  Body mass index is 32.26 kg/m. , not in acute distress, normal state of mind Eyes:  EOMI, no exophthalmos Neck: Supple Thyroid: No gross goiter Cardiovascular: RRR, no murmers, rubs, or gallops, no edema Respiratory: Adequate breathing efforts, no crackles, rales, rhonchi, or wheezing Musculoskeletal: no gross deformities, strength intact in all four extremities, no gross restriction of joint movements Skin:  no rashes, no hyperemia, scattered scabs to BLE Neurological: no tremor  with outstretched hands  POCT ABI Results 12/15/19   Right ABI:  1.18      Left ABI:  1.20  Right leg systolic / diastolic: 528/413 mmHg Left leg systolic / diastolic: 244/010 mmHg  Arm systolic / diastolic: 272/53 mmHG  Detailed report will be scanned into patient chart.   Results for orders placed or performed in visit on 12/15/19  HgB A1c  Result Value Ref Range   Hemoglobin A1C 6.8 (A) 4.0 - 5.6 %   HbA1c POC (<> result, manual entry)     HbA1c, POC (prediabetic range)     HbA1c, POC (controlled diabetic range)     Diabetic Labs (most recent): Lab Results  Component Value Date   HGBA1C  6.8 (A) 12/15/2019   HGBA1C 6.9 (A) 08/12/2019   HGBA1C 6.4 (H) 02/02/2019   CMP Latest Ref Rng & Units 12/09/2019 02/02/2019 08/06/2018  Glucose 65 - 99 mg/dL 119(H) 125(H) 95  BUN 8 - 27 mg/dL 23 19 20   Creatinine 0.76 - 1.27 mg/dL 0.91 0.85 1.00  Sodium 134 - 144 mmol/L 141 142 142  Potassium 3.5 - 5.2 mmol/L 3.7 4.0 4.0  Chloride 96 - 106 mmol/L 99 100 98  CO2 20 - 29 mmol/L 26 27 28   Calcium 8.6 - 10.2 mg/dL 9.4 9.5 9.7  Total Protein 6.0 - 8.5 g/dL 6.9 6.9 6.7  Total Bilirubin 0.0 - 1.2 mg/dL 1.2 0.7 0.8  Alkaline Phos 44 - 121 IU/L 129(H) 151(H) 108  AST 0 - 40 IU/L 24 24 22   ALT 0 - 44 IU/L 27 23 20    Lipid Panel     Component Value Date/Time   CHOL 122 12/09/2019 0908   TRIG 69 12/09/2019 0908   HDL 47 12/09/2019 0908   CHOLHDL 2.2 01/04/2016 0745   VLDL 13 01/04/2016 0745   LDLCALC 61 12/09/2019 0908   LABVLDL 14 12/09/2019 0908   Results for NICCOLO, BURGGRAF (MRN [id number]) as of 08/12/2019 16:33  Ref. Range 04/21/2018 08:34  TSH Latest Ref Range: 0.450 - 4.500 uIU/mL 2.110  T4,Free(Direct) Latest Ref Range: 0.82 - 1.77 ng/dL 1.34    Assessment & Plan:   1. Type 2 diabetes mellitus with vascular disease (HCC)  His diabetes is  complicated by CVA and patient remains at a high risk for more acute and chronic complications of diabetes which include CAD, CVA,  CKD, retinopathy, and neuropathy.  He presents today without his meter or logs.  He does not monitor blood glucose regularly at the advice from MD given his current DM medication regimen.  His POCT A1C today is 6.8%, slightly improving from last visit of 6.9%.  -He maintains 135 pounds of weight loss status post his Roux-en-Y bypass surgery, though has recently gained approximately 4 lbs.   Recent labs reviewed.  - Nutritional counseling repeated at each appointment due to patients tendency to fall back in to old habits.  - The patient admits there is a room for improvement in their diet and drink choices. -  Suggestion is made for the patient to avoid simple carbohydrates from their diet including Cakes, Sweet Desserts / Pastries, Ice Cream, Soda (diet and regular), Sweet Tea, Candies, Chips, Cookies, Sweet Pastries,  Store Bought Juices, Alcohol in Excess of  1-2 drinks a day, Artificial Sweeteners, Coffee Creamer, and "Sugar-free" Products. This will help patient to have stable blood glucose profile and potentially avoid unintended weight gain.   - I encouraged the patient to switch to  unprocessed or minimally processed complex starch and increased protein intake (animal or plant source), fruits, and vegetables.   - Patient is advised to stick to a routine mealtimes to eat 3 meals  a day and avoid unnecessary snacks ( to snack only to correct hypoglycemia).  - I have approached patient with the following individualized plan to manage diabetes and patient agrees.  - After a successful Roux-en-Y bariatric surgery and lost approximately 135 pounds, he was able to come off of multiple daily injections of insulin therapy.  -His dose of Metformin was reduced at last visit.  He is advised to continue Metformin 500 mg po daily with breakfast, an appropriate dose for him.  He is also advised to continue Trulicity 1.5 mg  SQ weekly.  - Patient specific target  for A1c; LDL, HDL, Triglycerides, and   Waist Circumference were discussed in detail.  2) BP/HTN:  His blood pressure is not controlled to target based on visit reading today.  He had not long taken his medication prior to his appointment today.  He is advised to continue Norvasc 10 mg po daily, Chlorthalidone 12.5 mg po daily, and Lisinopril 40 mg po daily.  Advised him to monitor BP at home and notify clinic if BP remains elevated over 140/90.  3) Lipids/HPL:  His most recent lipid panel from 12/09/19 shows controlled LDL of 61.  He is advised to continue Lipitor 10 mg po daily.  Side effects and precautions discussed with him.  4)  Weight/Diet:  His Body mass index is 32.26 kg/m. He is status post Roux-en-Y bariatric surgery.  CDE consult in progress, exercise, and carbohydrates information provided.  5) Chronic Care/Health Maintenance: -Patient is on ACEI/ARB and Statin medications and encouraged to continue to follow up with Ophthalmology, Podiatrist at least yearly or according to recommendations, and advised to  stay away from smoking. I have recommended yearly flu vaccine and pneumonia vaccination at least every 5 years; moderate intensity exercise for up to 150 minutes weekly; and  sleep for at least 7 hours a day.  6) Vitamin D deficiency -His most recent vitamin D level on 08/06/2018 was 51.  He is advised to continue daily maintenance dose of vitamin D3 until next measurement.   - I advised patient to maintain close follow up with his PCP and he is bariatric team for ongoing care needs.  - Time spent on this patient care encounter:  35 min, of which > 50% was spent in  counseling and the rest reviewing his blood glucose logs , discussing his hypoglycemia and hyperglycemia episodes, reviewing his current and  previous labs / studies  ( including abstraction from other facilities) and medications  doses and developing a  long term treatment plan and documenting his care.   Please refer to Patient Instructions for Blood  Glucose Monitoring and Insulin/Medications Dosing Guide"  in media tab for additional information. Please  also refer to " Patient Self Inventory" in the Media  tab for reviewed elements of pertinent patient history.  Zachary Shadow. participated in the discussions, expressed understanding, and voiced agreement with the above plans.  All questions were answered to his satisfaction. he is encouraged to contact clinic should he have any questions or concerns prior to his return visit.    Follow up plan: -Return in about 4 months (around 04/16/2020) for Diabetes follow up, Previsit labs, Virtual visit ok.    Rayetta Pigg, [facility] [address] [place], [place] [address] Phone: [phone] Fax: [phone]  12/15/2019, 9:19 AM

## 2019-12-15 NOTE — Patient Instructions (Signed)

## 2019-12-16 DIAGNOSIS — E559 Vitamin D deficiency, unspecified: Secondary | ICD-10-CM | POA: Diagnosis not present

## 2019-12-16 DIAGNOSIS — Z8673 Personal history of transient ischemic attack (TIA), and cerebral infarction without residual deficits: Secondary | ICD-10-CM | POA: Diagnosis not present

## 2019-12-16 DIAGNOSIS — E1169 Type 2 diabetes mellitus with other specified complication: Secondary | ICD-10-CM | POA: Diagnosis not present

## 2019-12-16 DIAGNOSIS — Z9884 Bariatric surgery status: Secondary | ICD-10-CM | POA: Diagnosis not present

## 2019-12-16 DIAGNOSIS — Z0000 Encounter for general adult medical examination without abnormal findings: Secondary | ICD-10-CM | POA: Diagnosis not present

## 2019-12-16 DIAGNOSIS — Z794 Long term (current) use of insulin: Secondary | ICD-10-CM | POA: Diagnosis not present

## 2019-12-16 DIAGNOSIS — G4733 Obstructive sleep apnea (adult) (pediatric): Secondary | ICD-10-CM | POA: Diagnosis not present

## 2019-12-16 DIAGNOSIS — Z125 Encounter for screening for malignant neoplasm of prostate: Secondary | ICD-10-CM | POA: Diagnosis not present

## 2019-12-16 DIAGNOSIS — I158 Other secondary hypertension: Secondary | ICD-10-CM | POA: Diagnosis not present

## 2019-12-21 ENCOUNTER — Other Ambulatory Visit: Payer: Self-pay | Admitting: "Endocrinology

## 2019-12-21 DIAGNOSIS — E1159 Type 2 diabetes mellitus with other circulatory complications: Secondary | ICD-10-CM

## 2019-12-22 ENCOUNTER — Telehealth: Payer: Self-pay | Admitting: Orthopedic Surgery

## 2019-12-22 NOTE — Telephone Encounter (Signed)
Patient has question about brace; please call, ph# [phone].

## 2019-12-22 NOTE — Telephone Encounter (Signed)
He can either wear the brace without the sock  Or he can get another brace, cannot replace just the sock His choice  He can come in but Not this afternoon I am at primary care office

## 2019-12-22 NOTE — Telephone Encounter (Signed)
Called back to patient regarding brace. He states he has lost the "sock" or "sleeve" that goes with the brace he was given through our office. Asking where he can get a replacement sleeve?

## 2019-12-22 NOTE — Telephone Encounter (Signed)
Can you see what the question is please ???

## 2019-12-29 DIAGNOSIS — I453 Trifascicular block: Secondary | ICD-10-CM | POA: Diagnosis not present

## 2019-12-29 DIAGNOSIS — I272 Pulmonary hypertension, unspecified: Secondary | ICD-10-CM | POA: Diagnosis not present

## 2019-12-29 DIAGNOSIS — I152 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: Secondary | ICD-10-CM | POA: Diagnosis not present

## 2019-12-29 DIAGNOSIS — I428 Other cardiomyopathies: Secondary | ICD-10-CM | POA: Diagnosis not present

## 2019-12-29 DIAGNOSIS — I639 Cerebral infarction, unspecified: Secondary | ICD-10-CM | POA: Diagnosis not present

## 2019-12-29 DIAGNOSIS — I5042 Chronic combined systolic (congestive) and diastolic (congestive) heart failure: Secondary | ICD-10-CM | POA: Diagnosis not present

## 2019-12-30 ENCOUNTER — Ambulatory Visit: Payer: Medicare PPO | Attending: Internal Medicine

## 2019-12-30 DIAGNOSIS — Z23 Encounter for immunization: Secondary | ICD-10-CM

## 2019-12-30 NOTE — Progress Notes (Signed)
   [id number]  Name:  Kegan Mckeithan.    MRN: [id number] DOB: [birth year]  12/30/2019  Mr. Vandevoorde was observed post Covid-19 immunization for 15 minutes without incident. He was provided with Vaccine Information Sheet and instruction to access the V-Safe system.   Mr. Etienne was instructed to call 911 with any severe reactions post vaccine: Marland Kitchen Difficulty breathing  . Swelling of face and throat  . A fast heartbeat  . A bad rash all over body  . Dizziness and weakness

## 2020-01-12 DIAGNOSIS — I152 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: Secondary | ICD-10-CM | POA: Diagnosis not present

## 2020-01-12 DIAGNOSIS — E2609 Other primary hyperaldosteronism: Secondary | ICD-10-CM | POA: Diagnosis not present

## 2020-01-12 DIAGNOSIS — E669 Obesity, unspecified: Secondary | ICD-10-CM | POA: Diagnosis not present

## 2020-01-12 DIAGNOSIS — E1159 Type 2 diabetes mellitus with other circulatory complications: Secondary | ICD-10-CM | POA: Diagnosis not present

## 2020-01-12 DIAGNOSIS — G4733 Obstructive sleep apnea (adult) (pediatric): Secondary | ICD-10-CM | POA: Diagnosis not present

## 2020-01-13 DIAGNOSIS — Z23 Encounter for immunization: Secondary | ICD-10-CM | POA: Diagnosis not present

## 2020-02-07 DIAGNOSIS — Z95818 Presence of other cardiac implants and grafts: Secondary | ICD-10-CM | POA: Diagnosis not present

## 2020-02-07 DIAGNOSIS — I639 Cerebral infarction, unspecified: Secondary | ICD-10-CM | POA: Diagnosis not present

## 2020-02-21 ENCOUNTER — Other Ambulatory Visit: Payer: Self-pay

## 2020-02-21 ENCOUNTER — Ambulatory Visit (HOSPITAL_COMMUNITY): Payer: Medicare PPO | Attending: Physician Assistant | Admitting: Physical Therapy

## 2020-02-21 ENCOUNTER — Encounter (HOSPITAL_COMMUNITY): Payer: Self-pay | Admitting: Physical Therapy

## 2020-02-21 DIAGNOSIS — M545 Low back pain, unspecified: Secondary | ICD-10-CM | POA: Insufficient documentation

## 2020-02-21 DIAGNOSIS — M6281 Muscle weakness (generalized): Secondary | ICD-10-CM | POA: Insufficient documentation

## 2020-02-21 DIAGNOSIS — R262 Difficulty in walking, not elsewhere classified: Secondary | ICD-10-CM | POA: Diagnosis not present

## 2020-02-21 DIAGNOSIS — G8929 Other chronic pain: Secondary | ICD-10-CM | POA: Diagnosis not present

## 2020-02-21 NOTE — Therapy (Signed)
[place], [place], [address] Phone: [phone]   Fax:  [phone]  Physical Therapy Evaluation  Patient Details  Name: Zachary Murillo. MRN: [id number] Date of Birth: 1950/08/04 Referring Provider (PT): Robyne Askew, [place]   Encounter Date: 02/21/2020   PT End of Session - 02/21/20 1604    Visit Number 1    Number of Visits 12    Date for PT Re-Evaluation 04/03/20    Authorization Type [employer]- auth required, no visit limit    Progress Note Due on Visit 10    PT Start Time 1531    PT Stop Time 1603    PT Time Calculation (min) 32 min    Activity Tolerance Patient tolerated treatment well;No increased pain    Behavior During Therapy WFL for tasks assessed/performed           Past Medical History:  Diagnosis Date  . Acute ischemic stroke ([place]) 02/22/2013  . CHF (congestive heart failure) ([place])   . CVA (cerebral infarction) 01/22/2013  . Diabetes mellitus ([place])   . Difficulty in walking(719.7) 01/25/2013  . History of Roux-en-Y gastric bypass   . Hypertension   . IMPINGEMENT SYNDROME 01/16/2009   Qualifier: Diagnosis of  By: Aline Brochure MD, Dorothyann Peng    . KNEE PAIN 01/16/2009   Qualifier: Diagnosis of  By: Aline Brochure MD, Dorothyann Peng    . Knee pain, right 09/05/2011  . KNEE, ARTHRITIS, DEGEN./OSTEO 01/30/2009   Qualifier: Diagnosis of  By: Aline Brochure MD, Dorothyann Peng    . Lack of coordination 01/22/2013  . Non-ischemic cardiomyopathy ([place])   . Osteoarthritis of knee   . Other obesity due to excess calories 01/30/2015  . Overweight(278.02)   . Pulmonary hypertension ([place])   . 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 12/2014, Dr. Flavia Shipper  . S/P lumbar fusion 07/02/2013  . SHOULDER PAIN 01/16/2009   Qualifier: Diagnosis of  By: Aline Brochure MD, Dorothyann Peng    . Shoulder pain, right   . Sleep apnea   . Sleep apnea   . Spinal stenosis   . Stroke ([place])   . TENDINITIS,  LEFT KNEE 01/16/2009   Qualifier: Diagnosis of  By: Aline Brochure MD, Dorothyann Peng    . Vitamin D deficiency     Past Surgical History:  Procedure Laterality Date  . BACK SURGERY    . CARDIAC CATHETERIZATION    . KNEE ARTHROSCOPY    . NASAL SEPTUM SURGERY      There were no vitals filed for this visit.    Subjective Assessment - 02/21/20 1557    Subjective Patient reports 10 year history of lumbar fusion.  Reports increased pain in back for the past week after using leaf blower. Sleeping flat on back with legs elevated to relieve pain. Currently c/o 2/10 back pain increasing to 4/10 with walking through clinic    Limitations Lifting;Standing;Walking    How long can you sit comfortably? 30    How long can you stand comfortably? 15    How long can you walk comfortably? 15    Patient Stated Goals Have my back feel better and get back to [facility]    Currently in Pain? Yes    Pain Score 4     Pain Location Back    Pain Orientation Right    Pain Descriptors / Indicators Aching;Penetrating    Pain Type Acute pain    Pain Onset 1 to 4 weeks  ago              [facility] PT Assessment - 02/21/20 0001      Assessment   Medical Diagnosis LBP    Referring Provider (PT) Robyne Askew, PA      Balance Screen   Has the patient fallen in the past 6 months Yes    How many times? 1    Has the patient had a decrease in activity level because of a fear of falling?  No    Is the patient reluctant to leave their home because of a fear of falling?  No      Home Ecologist residence      Prior Function   Level of Independence Independent    Vocation Retired    Leisure walking x 2 hours      Observation/Other Assessments   Focus on Therapeutic Outcomes (FOTO)  44% function      ROM / Strength   AROM / PROM / Strength Strength;AROM   functional strength observed. Patient requires definite use of UE to perform transfers. Difficulty with supine to sit requiring min A     AROM    Overall AROM  Deficits    Overall AROM Comments pt requiring UE support to retrieve item from ground due to limited lumbar flexion/guarding      Palpation   Palpation comment R QL and paraspinals TTP      Bed Mobility   Bed Mobility Supine to Sit    Supine to Sit Minimal Assistance - Patient > 75%      Transfers   Transfers Sit to Stand    Sit to Stand 6: Modified independent (Device/Increase time);With upper extremity assist      Ambulation/Gait   Ambulation/Gait Yes    Ambulation/Gait Assistance 6: Modified independent (Device/Increase time)    Ambulation Distance (Feet) 265 Feet    Assistive device None    Gait Pattern Shuffle;Lateral trunk lean to left    Ambulation Surface Level    Gait velocity decreased    Gait Comments 2MWT                      Objective measurements completed on examination: See above findings.       [place] Adult PT Treatment/Exercise - 02/21/20 0001      Manual Therapy   Manual Therapy Soft tissue mobilization    Manual therapy comments completed separate from all other skilled interventions    Soft tissue mobilization performed mobilization to R QL to allieviate spasm and reduce paraspinal guarding performed with patient in supine and then placed to L sidelying                  PT Education - 02/21/20 1603    Education Details pt educated on self-massage with tennis ball to R QL for spasm relief    Person(s) Educated Patient    Methods Explanation;Demonstration    Comprehension Verbalized understanding            PT Short Term Goals - 02/21/20 1618      PT SHORT TERM GOAL #1   Title Patient will demo decreased pain and improved functional strength as evidenced by modified Indepedent supine to sit transfer    Baseline Min A supine to sit    Time 3    Period Weeks    Status New    Target Date 03/13/20      PT  SHORT TERM GOAL #2   Title Patient will ambulate 400 ft during 2MWT and pain not exceeding 2/10 in  lumbar spine    Baseline 265 ft during 2MWT and 4/10 lumbar pain    Time 3    Period Weeks    Status New    Target Date 03/13/20      PT SHORT TERM GOAL #3   Title Patient will demo ability to bend/stoop/or squat to retrieve item from ground without UE support    Baseline external support/UE support required to pick up item from floor    Time 3    Period Weeks    Target Date 03/13/20             PT Long Term Goals - 02/21/20 1622      PT LONG TERM GOAL #1   Title Patient will be independent in HEP for lumbar/abdominal strengthening to reduce risk for re-injury    Baseline in development    Time 6    Period Weeks    Status New    Target Date 04/03/20      PT LONG TERM GOAL #2   Title Patient will demo decreased risk for falls per time 18 sec 5 Times Sit to Stand Test    Baseline required UE support to transfer sit to stand    Time 6    Period Weeks    Status New    Target Date 04/03/20      PT LONG TERM GOAL #3   Title Patient will improve on FOTO score to meet predicted outcomes to improve functional indepdence    Baseline 44% function    Time 6    Period Weeks    Status New    Target Date 04/03/20                  Plan - 02/21/20 1611    Clinical Impression Statement Patient exhibits acute exacerbation of LBP and new onset of spasm to R QL and paraspinals and demonstrates generalized weakness and reduced functional activity tolerance as a result requiring physical assistance to complete supine to sit and requires BUE support to complete functional transfers.  Exhibits reduced ambulation velocity and tolerance with gait deviations also present due to current condition.  Patient would benefit from skilled PT services to decrease soft tissue dysfunction of lumbar spine and to increase ROM, strength, and activity tolerance to facilitate return to PLOF and leisure activities    Personal Factors and Comorbidities Comorbidity 1;Age;Fitness;Comorbidity 2     Comorbidities obesity, hx of lumbar fusion x 10 years ago    Examination-Activity Limitations Bed Mobility;Bend;Carry;Lift;Stand;Squat    Examination-Participation Restrictions Cleaning;Community Activity;Laundry;Yard Work    Stability/Clinical Decision Making Stable/Uncomplicated    Designer, jewellery Low    Rehab Potential Good    PT Frequency 2x / week    PT Duration 6 weeks    PT Treatment/Interventions ADLs/Self Care Home Management;Aquatic Therapy;Biofeedback;Electrical Stimulation;DME Instruction;Ultrasound;Moist Heat;Gait training;Stair training;Functional mobility training;Therapeutic activities;Therapeutic exercise;Balance training;Patient/family education;Neuromuscular re-education;Manual techniques;Passive range of motion;Taping;Energy conservation;Spinal Manipulations;Joint Manipulations    PT Next Visit Plan continue with tx for spasm control paraspinals, RQL, initiate gentle ROM and LE stretching    PT Home Exercise Plan positioning with knees/hips flexed 90/90    Consulted and Agree with Plan of Care Patient           Patient will benefit from skilled therapeutic intervention in order to improve the following deficits and impairments:  Abnormal gait,Decreased activity  tolerance,Decreased mobility,Decreased endurance,Decreased range of motion,Decreased strength,Difficulty walking,Increased muscle spasms,Impaired flexibility,Improper body mechanics,Postural dysfunction,Pain  Visit Diagnosis: Chronic right-sided low back pain without sciatica - Plan: PT plan of care cert/re-cert  Muscle weakness (generalized) - Plan: PT plan of care cert/re-cert  Difficulty in walking, not elsewhere classified - Plan: PT plan of care cert/re-cert     Problem List Patient Active Problem List   Diagnosis Date Noted  . Bilateral sensorineural hearing loss 06/30/2019  . Primary hyperaldosteronism ([place]) 03/22/2015  . Type 2 diabetes mellitus with vascular disease ([place]) 01/30/2015  .  Mixed hyperlipidemia 01/30/2015  . Essential hypertension, benign 01/30/2015  . Vitamin D deficiency 01/30/2015  . Obesity (BMI 30.0-34.9) 01/30/2015  . Malignant hypertension 08/03/2014  . 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  . Lumbar spinal stenosis 07/02/2013  . Nonischemic cardiomyopathy ([place]) 06/21/2013  . DDD (degenerative disc disease), 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/2013  . Pulmonary hypertension ([place]) 03/23/2013  . High aldosterone level 02/19/2013  . Difficulty in walking, not elsewhere classified 01/25/2013  . Cerebral infarction ([place]) 01/22/2013  . Heart failure, systolic and diastolic, chronic ([place]) [id number]  . Abnormal echocardiogram 12/24/2012  . Sleep apnea 12/22/2012  . 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 OA (osteoarthritis) of knee 09/05/2011    4:34 PM, 02/21/20 M. Sherlyn Lees, PT, DPT Physical Therapist- [place] Office Number: [phone]  [place] [address] [place], [place], [address] Phone: [phone]   Fax:  [phone]  Name: Zachary Murillo. MRN: [id number] Date of Birth: 16-Oct-1950

## 2020-03-01 ENCOUNTER — Encounter: Payer: Self-pay | Admitting: Orthopedic Surgery

## 2020-03-01 ENCOUNTER — Ambulatory Visit (INDEPENDENT_AMBULATORY_CARE_PROVIDER_SITE_OTHER): Payer: Medicare PPO | Admitting: Orthopedic Surgery

## 2020-03-01 ENCOUNTER — Other Ambulatory Visit: Payer: Self-pay

## 2020-03-01 ENCOUNTER — Ambulatory Visit: Payer: Medicare PPO

## 2020-03-01 VITALS — BP 123/78 | HR 78 | Ht 70.0 in | Wt 230.0 lb

## 2020-03-01 DIAGNOSIS — M25511 Pain in right shoulder: Secondary | ICD-10-CM | POA: Diagnosis not present

## 2020-03-01 DIAGNOSIS — M1711 Unilateral primary osteoarthritis, right knee: Secondary | ICD-10-CM

## 2020-03-01 DIAGNOSIS — G8929 Other chronic pain: Secondary | ICD-10-CM | POA: Diagnosis not present

## 2020-03-01 NOTE — Progress Notes (Signed)
FOLLOW-UP OFFICE VISIT   Encounter Diagnoses  Name Primary?   Unilateral primary osteoarthritis, right knee Yes   Chronic right shoulder pain     69 year old male with chronic right knee pain from osteoarthritis comes in for a checkup complaining of intermittent sharp pain medial side of his right knee that comes and goes last felt around Thanksgiving.  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   Unilateral primary osteoarthritis, right knee Yes   Chronic right shoulder pain      As far as the knee goes I think he may have a meniscal tear he has had arthroscopic surgery there before but seems to have intermittent symptoms consistent with displaced fragment  He is not ready to have MRI yet he would like to wait until he is more symptomatic  As far as his right shoulder he is agreeable to injection Procedure note the subacromial injection shoulder RIGHT    Verbal consent was obtained to inject the  RIGHT   Shoulder  Timeout was completed to confirm the injection site is a subacromial space of the  RIGHT  shoulder   Medication used Depo-Medrol 40 mg and lidocaine 1% 3 cc  Anesthesia was provided by ethyl chloride  The injection was performed in the RIGHT  posterior subacromial space. After pinning the skin with alcohol and  anesthetized the skin with ethyl chloride the subacromial space was injected using a 20-gauge needle. There were no complications  Sterile dressing was applied.  Observation of the knee poss mri if remains symptomatic

## 2020-03-01 NOTE — Patient Instructions (Signed)
You have received an injection of steroids into the joint. 15% of patients will have increased pain within the 24 hours postinjection.   This is transient and will go away.   We recommend that you use ice packs on the injection site for 20 minutes every 2 hours and extra strength Tylenol 2 tablets every 8 as needed until the pain resolves.  If you continue to have pain after taking the Tylenol and using the ice please call the office for further instructions.  Call us for mri if knee gets worse

## 2020-03-13 DIAGNOSIS — Z95818 Presence of other cardiac implants and grafts: Secondary | ICD-10-CM | POA: Diagnosis not present

## 2020-03-13 DIAGNOSIS — I639 Cerebral infarction, unspecified: Secondary | ICD-10-CM | POA: Diagnosis not present

## 2020-03-14 ENCOUNTER — Encounter (HOSPITAL_COMMUNITY): Payer: Self-pay

## 2020-03-14 ENCOUNTER — Ambulatory Visit (HOSPITAL_COMMUNITY): Payer: Medicare PPO | Attending: Physician Assistant

## 2020-03-14 ENCOUNTER — Other Ambulatory Visit: Payer: Self-pay

## 2020-03-14 DIAGNOSIS — R262 Difficulty in walking, not elsewhere classified: Secondary | ICD-10-CM

## 2020-03-14 DIAGNOSIS — G8929 Other chronic pain: Secondary | ICD-10-CM | POA: Diagnosis not present

## 2020-03-14 DIAGNOSIS — M545 Low back pain, unspecified: Secondary | ICD-10-CM

## 2020-03-14 DIAGNOSIS — M6281 Muscle weakness (generalized): Secondary | ICD-10-CM

## 2020-03-14 NOTE — Therapy (Signed)
[facility] [address] [place], [place], [address] Phone: [phone]   Fax:  [phone]  Physical Therapy Treatment  Patient Details  Name: Zachary Murillo. MRN: [id number] Date of Birth: [birth year] Referring Provider (PT): Johny Sax, [place]   Encounter Date: 03/14/2020   PT End of Session - 03/14/20 1021    Visit Number 2    Number of Visits 12    Date for PT Re-Evaluation 04/03/20    Authorization Type [employer] Medicare- no visit limit    Authorization Time Period 12 visits approved 12/31-->04/03/20    Progress Note Due on Visit 10    PT Start Time 1000    PT Stop Time 1042    PT Time Calculation (min) 42 min    Activity Tolerance Patient tolerated treatment well;No increased pain    Behavior During Therapy WFL for tasks assessed/performed           Past Medical History:  Diagnosis Date  . Acute ischemic stroke ([facility]) 02/22/2013  . CHF (congestive heart failure) ([facility])   . CVA (cerebral infarction) 01/22/2013  . Diabetes mellitus ([facility])   . Difficulty in walking(719.7) 01/25/2013  . History of Roux-en-Y gastric bypass   . Hypertension   . IMPINGEMENT SYNDROME 01/16/2009   Qualifier: Diagnosis of  By: Romeo Apple MD, Duffy Rhody    . KNEE PAIN 01/16/2009   Qualifier: Diagnosis of  By: Romeo Apple MD, Duffy Rhody    . Knee pain, right 09/05/2011  . KNEE, ARTHRITIS, DEGEN./OSTEO 01/30/2009   Qualifier: Diagnosis of  By: Romeo Apple MD, Duffy Rhody    . Lack of coordination 01/22/2013  . Non-ischemic cardiomyopathy ([facility])   . Osteoarthritis of knee   . Other obesity due to excess calories 01/30/2015  . Overweight(278.02)   . Pulmonary hypertension ([facility])   . 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 12/2014, Dr. Bufford Lope  . S/P lumbar fusion 07/02/2013  . SHOULDER PAIN 01/16/2009   Qualifier: Diagnosis of  By: Romeo Apple MD, Duffy Rhody    . Shoulder pain, right   . Sleep apnea   . Sleep apnea   .  Spinal stenosis   . Stroke ([facility])   . TENDINITIS, LEFT KNEE 01/16/2009   Qualifier: Diagnosis of  By: Romeo Apple MD, Duffy Rhody    . Vitamin D deficiency     Past Surgical History:  Procedure Laterality Date  . BACK SURGERY    . CARDIAC CATHETERIZATION    . KNEE ARTHROSCOPY    . NASAL SEPTUM SURGERY      There were no vitals filed for this visit.   Subjective Assessment - 03/14/20 1004    Subjective Pt stated he is feeling good today.  No real pain just tightness on Rt lower back.  Has began HEP stretches, no questions wiht new exercises.    Patient Stated Goals Have my back feel better and get back to [facility]    Currently in Pain? Yes    Pain Score 3     Pain Location Back    Pain Orientation Right    Pain Descriptors / Indicators Tightness    Pain Type Acute pain    Pain Onset 1 to 4 weeks ago    Pain Frequency Intermittent    Aggravating Factors  movement, walking incline slope and steps    Pain Relieving Factors Tylonel  [place] Adult PT Treatment/Exercise - 03/14/20 0001      Bed Mobility   Bed Mobility Sit to Sidelying Right;Sit to Sidelying Left    Sit to Sidelying Right Supervision/Verbal cueing   Educated log rolling   Sit to Sidelying Left Supervision/Verbal cueing   Log rolling instructed     Exercises   Exercises Lumbar      Lumbar Exercises: Stretches   Lower Trunk Rotation 5 reps;10 seconds    Other Lumbar Stretch Exercise seated QL with Rt UE reach over head      Lumbar Exercises: Seated   Sit to Stand 5 reps    Sit to Stand Limitations eccentric control, no plopping    Other Seated Lumbar Exercises self care with tennis ball Rt QL x 3 minutes      Lumbar Exercises: Supine   Bent Knee Raise 10 reps;3 seconds    Bent Knee Raise Limitations controlled lowering with ab set      Lumbar Exercises: Prone   Other Prone Lumbar Exercises windshild wipes 10      Manual Therapy   Manual Therapy Soft tissue mobilization     Manual therapy comments Manual complete separate than rest of tx    Soft tissue mobilization Prone:  STM to lumbar paraspinals and Rt QL                  PT Education - 03/14/20 1015    Education Details Reviewed goals, educated importance of HEP compliance, reviewed form and mechanics wiht current HEP wiht some cuieng required    Person(s) Educated Patient    Methods Explanation;Demonstration    Comprehension Verbalized understanding;Returned demonstration            PT Short Term Goals - 02/21/20 1618      PT SHORT TERM GOAL #1   Title Patient will demo decreased pain and improved functional strength as evidenced by modified Indepedent supine to sit transfer    Baseline Min A supine to sit    Time 3    Period Weeks    Status New    Target Date 03/13/20      PT SHORT TERM GOAL #2   Title Patient will ambulate 400 ft during 2MWT and pain not exceeding 2/10 in lumbar spine    Baseline 265 ft during 2MWT and 4/10 lumbar pain    Time 3    Period Weeks    Status New    Target Date 03/13/20      PT SHORT TERM GOAL #3   Title Patient will demo ability to bend/stoop/or squat to retrieve item from ground without UE support    Baseline external support/UE support required to pick up item from floor    Time 3    Period Weeks    Target Date 03/13/20             PT Long Term Goals - 02/21/20 1622      PT LONG TERM GOAL #1   Title Patient will be independent in HEP for lumbar/abdominal strengthening to reduce risk for re-injury    Baseline in development    Time 6    Period Weeks    Status New    Target Date 04/03/20      PT LONG TERM GOAL #2   Title Patient will demo decreased risk for falls per time 18 sec 5 Times Sit to Stand Test    Baseline required UE support to transfer sit to stand  Time 6    Period Weeks    Status New    Target Date 04/03/20      PT LONG TERM GOAL #3   Title Patient will improve on FOTO score to meet predicted outcomes to  improve functional indepdence    Baseline 44% function    Time 6    Period Weeks    Status New    Target Date 04/03/20                 Plan - 03/14/20 1050    Clinical Impression Statement Reviewed goals, educated importance of HEP compliance, pt required cueing for proper form and mechanics with current exercise program.  Session focus on stretches for lumbar mobility, core and proximal strengthening.  Educated on self massage for pain control wiht tennis ball.  EOS with STM to address QL and paraspinal spasms wiht reports of relief followoing.  Pt reports intrest in aquatic therapy as c/o increased pain following walking, evaluation therapist messaged concerning.    Personal Factors and Comorbidities Comorbidity 1;Age;Fitness;Comorbidity 2    Comorbidities obesity, hx of lumbar fusion x 10 years ago    Examination-Activity Limitations Bed Mobility;Bend;Carry;Lift;Stand;Squat    Examination-Participation Restrictions Cleaning;Community Activity;Laundry;Yard Work    Stability/Clinical Decision Making Stable/Uncomplicated    Designer, jewellery Low    Rehab Potential Good    PT Frequency 2x / week    PT Duration 6 weeks    PT Treatment/Interventions ADLs/Self Care Home Management;Aquatic Therapy;Biofeedback;Electrical Stimulation;DME Instruction;Ultrasound;Moist Heat;Gait training;Stair training;Functional mobility training;Therapeutic activities;Therapeutic exercise;Balance training;Patient/family education;Neuromuscular re-education;Manual techniques;Passive range of motion;Taping;Energy conservation;Spinal Manipulations;Joint Manipulations    PT Next Visit Plan continue with tx for spasm control paraspinals, RQL, initiate gentle ROM and LE stretching    PT Home Exercise Plan positioning with knees/hips flexed 90/90; 03/14/20: seated QL stretch           Patient will benefit from skilled therapeutic intervention in order to improve the following deficits and impairments:   Abnormal gait,Decreased activity tolerance,Decreased mobility,Decreased endurance,Decreased range of motion,Decreased strength,Difficulty walking,Increased muscle spasms,Impaired flexibility,Improper body mechanics,Postural dysfunction,Pain  Visit Diagnosis: Chronic right-sided low back pain without sciatica  Difficulty in walking, not elsewhere classified  Muscle weakness (generalized)     Problem List Patient Active Problem List   Diagnosis Date Noted  . Bilateral sensorineural hearing loss 06/30/2019  . Primary hyperaldosteronism ([place]) 03/22/2015  . Type 2 diabetes mellitus with vascular disease ([place]) 01/30/2015  . Mixed hyperlipidemia 01/30/2015  . Essential hypertension, benign 01/30/2015  . Vitamin D deficiency 01/30/2015  . Obesity (BMI 30.0-34.9) 01/30/2015  . Malignant hypertension 08/03/2014  . 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  . Lumbar spinal stenosis 07/02/2013  . Nonischemic cardiomyopathy ([place]) 06/21/2013  . DDD (degenerative disc disease), 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/2013  . Pulmonary hypertension ([place]) 03/23/2013  . High aldosterone level 02/19/2013  . Difficulty in walking, not elsewhere classified 01/25/2013  . Cerebral infarction ([place]) 01/22/2013  . Heart failure, systolic and diastolic, chronic ([place]) [id number]  . Abnormal echocardiogram 12/24/2012  . Sleep apnea 12/22/2012  . 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 OA (osteoarthritis) of knee 09/05/2011   Ihor Austin, LPTA/CLT; CBIS [phone]  Aldona Lento 03/14/2020, 11:03 AM  [place] [address] [place], [place], [address] Phone: [phone]   Fax:  [phone]  Name: Zachary Murillo. MRN: [id number] Date of Birth: 19-Jan-1951

## 2020-03-16 ENCOUNTER — Ambulatory Visit (HOSPITAL_COMMUNITY): Payer: Medicare PPO

## 2020-03-16 ENCOUNTER — Other Ambulatory Visit: Payer: Self-pay

## 2020-03-16 ENCOUNTER — Encounter (HOSPITAL_COMMUNITY): Payer: Self-pay

## 2020-03-16 DIAGNOSIS — M6281 Muscle weakness (generalized): Secondary | ICD-10-CM | POA: Diagnosis not present

## 2020-03-16 DIAGNOSIS — G8929 Other chronic pain: Secondary | ICD-10-CM | POA: Diagnosis not present

## 2020-03-16 DIAGNOSIS — R262 Difficulty in walking, not elsewhere classified: Secondary | ICD-10-CM

## 2020-03-16 DIAGNOSIS — M545 Low back pain, unspecified: Secondary | ICD-10-CM | POA: Diagnosis not present

## 2020-03-16 NOTE — Patient Instructions (Addendum)
Functional Quadriceps: Sit to Stand    Sit on edge of chair, feet flat on floor. Stand upright, extending knees fully. Repeat 10 times per set. Do 1 sets per session.  http://orth.exer.us/734   Copyright  VHI. All rights reserved.   Bridging    Slowly raise buttocks from floor, keeping stomach tight. Repeat 10 times per set. Do 1 sets per session.   http://orth.exer.us/1096   Copyright  VHI. All rights reserved.

## 2020-03-16 NOTE — Therapy (Signed)
[facility] [address] [place], [place], [address] Phone: [phone]   Fax:  [phone]  Physical Therapy Treatment  Patient Details  Name: Zachary Murillo. MRN: [id number] Date of Birth: 02/16/1951 Referring Provider (PT): Johny Sax, [place]   Encounter Date: 03/16/2020   PT End of Session - 03/16/20 0923    Visit Number 3    Number of Visits 12    Date for PT Re-Evaluation 04/03/20    Authorization Type [employer]- no visit limit    Authorization Time Period 12 visits approved 12/31-->04/03/20    Progress Note Due on Visit 10    PT Start Time 0915    PT Stop Time 0958    PT Time Calculation (min) 43 min    Activity Tolerance Patient tolerated treatment well;No increased pain    Behavior During Therapy WFL for tasks assessed/performed           Past Medical History:  Diagnosis Date  . Acute ischemic stroke (HCC) 02/22/2013  . CHF (congestive heart failure) (HCC)   . CVA (cerebral infarction) 01/22/2013  . Diabetes mellitus (HCC)   . Difficulty in walking(719.7) 01/25/2013  . History of Roux-en-Y gastric bypass   . Hypertension   . IMPINGEMENT SYNDROME 01/16/2009   Qualifier: Diagnosis of  By: Romeo Apple MD, Duffy Rhody    . KNEE PAIN 01/16/2009   Qualifier: Diagnosis of  By: Romeo Apple MD, Duffy Rhody    . Knee pain, right 09/05/2011  . KNEE, ARTHRITIS, DEGEN./OSTEO 01/30/2009   Qualifier: Diagnosis of  By: Romeo Apple MD, Duffy Rhody    . Lack of coordination 01/22/2013  . Non-ischemic cardiomyopathy (HCC)   . Osteoarthritis of knee   . Other obesity due to excess calories 01/30/2015  . Overweight(278.02)   . Pulmonary hypertension (HCC)   . 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 12/2014, Dr. Bufford Lope  . S/P lumbar fusion 07/02/2013  . SHOULDER PAIN 01/16/2009   Qualifier: Diagnosis of  By: Romeo Apple MD, Duffy Rhody    . Shoulder pain, right   . Sleep apnea   . Sleep apnea   .  Spinal stenosis   . Stroke (HCC)   . TENDINITIS, LEFT KNEE 01/16/2009   Qualifier: Diagnosis of  By: Romeo Apple MD, Duffy Rhody    . Vitamin D deficiency     Past Surgical History:  Procedure Laterality Date  . BACK SURGERY    . CARDIAC CATHETERIZATION    . KNEE ARTHROSCOPY    . NASAL SEPTUM SURGERY      There were no vitals filed for this visit.   Subjective Assessment - 03/16/20 0918    Subjective Pt stated he is feeling good today, has began new stretch at home without questions.  Has began self massage wiht tennis ball at home.    Patient Stated Goals Have my back feel better and get back to [facility]    Currently in Pain? No/denies            [facility] Adult PT Treatment/Exercise - 03/16/20 0001      Bed Mobility   Bed Mobility Sit to Sidelying Right;Sit to Sidelying Left    Sit to Sidelying Right Independent   able to recall and demonstrate appropriate   Sit to Sidelying Left Independent   able to recall and demonstrate appropriate     Exercises   Exercises Lumbar      Lumbar Exercises: Stretches   Single Knee to Chest  Stretch 2 reps;30 seconds    Lower Trunk Rotation 5 reps;10 seconds    Other Lumbar Stretch Exercise seated QL with Rt UE reach over head      Lumbar Exercises: Seated   Sit to Stand 10 reps    Sit to Stand Limitations 2 sets; NBOS 2nd set required elevated height      Lumbar Exercises: Supine   Bent Knee Raise 10 reps;3 seconds    Bent Knee Raise Limitations controlled lowering with ab set    Bridge 10 reps;5 seconds      Lumbar Exercises: Prone   Other Prone Lumbar Exercises prone heel squeeze 10x 5"                    PT Short Term Goals - 02/21/20 1618      PT SHORT TERM GOAL #1   Title Patient will demo decreased pain and improved functional strength as evidenced by modified Indepedent supine to sit transfer    Baseline Min A supine to sit    Time 3    Period Weeks    Status New    Target Date 03/13/20      PT SHORT TERM GOAL #2    Title Patient will ambulate 400 ft during 2MWT and pain not exceeding 2/10 in lumbar spine    Baseline 265 ft during 2MWT and 4/10 lumbar pain    Time 3    Period Weeks    Status New    Target Date 03/13/20      PT SHORT TERM GOAL #3   Title Patient will demo ability to bend/stoop/or squat to retrieve item from ground without UE support    Baseline external support/UE support required to pick up item from floor    Time 3    Period Weeks    Target Date 03/13/20             PT Long Term Goals - 02/21/20 1622      PT LONG TERM GOAL #1   Title Patient will be independent in HEP for lumbar/abdominal strengthening to reduce risk for re-injury    Baseline in development    Time 6    Period Weeks    Status New    Target Date 04/03/20      PT LONG TERM GOAL #2   Title Patient will demo decreased risk for falls per time 18 sec 5 Times Sit to Stand Test    Baseline required UE support to transfer sit to stand    Time 6    Period Weeks    Status New    Target Date 04/03/20      PT LONG TERM GOAL #3   Title Patient will improve on FOTO score to meet predicted outcomes to improve functional indepdence    Baseline 44% function    Time 6    Period Weeks    Status New    Target Date 04/03/20                 Plan - 03/16/20 0932    Clinical Impression Statement Continued session focus with stretches to address tightness lower back and added gluteal strengthening to improve functional strengthening and gait mechanics.  Pt demonstrates weakness with inability to complete STS from standard height, elevated height with ability to complete without HHA, increased difficulty with NBOS.  EOS with STM to address restrictions for mobility and pain control, able to reduce tightness Rt QL, paraspinals, Rt  PIIS wiht reports of relief following.    Personal Factors and Comorbidities Comorbidity 1;Age;Fitness;Comorbidity 2    Comorbidities obesity, hx of lumbar fusion x 10 years ago     Examination-Activity Limitations Bed Mobility;Bend;Carry;Lift;Stand;Squat    Examination-Participation Restrictions Cleaning;Community Activity;Laundry;Yard Work    Stability/Clinical Decision Making Stable/Uncomplicated    Designer, jewellery Low    Rehab Potential Good    PT Frequency 2x / week    PT Duration 6 weeks    PT Treatment/Interventions ADLs/Self Care Home Management;Aquatic Therapy;Biofeedback;Electrical Stimulation;DME Instruction;Ultrasound;Moist Heat;Gait training;Stair training;Functional mobility training;Therapeutic activities;Therapeutic exercise;Balance training;Patient/family education;Neuromuscular re-education;Manual techniques;Passive range of motion;Taping;Energy conservation;Spinal Manipulations;Joint Manipulations    PT Next Visit Plan Add 3D hip excursion next session.  continue with tx for spasm control paraspinals, RQL, initiate gentle ROM and LE stretching    PT Home Exercise Plan positioning with knees/hips flexed 90/90; 03/14/20: seated QL stretch; 03/16/20: STS and bridge           Patient will benefit from skilled therapeutic intervention in order to improve the following deficits and impairments:  Abnormal gait,Decreased activity tolerance,Decreased mobility,Decreased endurance,Decreased range of motion,Decreased strength,Difficulty walking,Increased muscle spasms,Impaired flexibility,Improper body mechanics,Postural dysfunction,Pain  Visit Diagnosis: Chronic right-sided low back pain without sciatica  Difficulty in walking, not elsewhere classified  Muscle weakness (generalized)     Problem List Patient Active Problem List   Diagnosis Date Noted  . Bilateral sensorineural hearing loss 06/30/2019  . Primary hyperaldosteronism ([place]) 03/22/2015  . Type 2 diabetes mellitus with vascular disease ([place]) 01/30/2015  . Mixed hyperlipidemia 01/30/2015  . Essential hypertension, benign 01/30/2015  . Vitamin D deficiency 01/30/2015  . Obesity (BMI  30.0-34.9) 01/30/2015  . Malignant hypertension 08/03/2014  . 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  . Lumbar spinal stenosis 07/02/2013  . Nonischemic cardiomyopathy ([place]) 06/21/2013  . DDD (degenerative disc disease), 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/2013  . Pulmonary hypertension ([place]) 03/23/2013  . High aldosterone level 02/19/2013  . Difficulty in walking, not elsewhere classified 01/25/2013  . Cerebral infarction ([place]) 01/22/2013  . Heart failure, systolic and diastolic, chronic ([place]) 99991111  . Abnormal echocardiogram 12/24/2012  . Sleep apnea 12/22/2012  . 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 OA (osteoarthritis) of knee 09/05/2011   Ihor Austin, LPTA/CLT; CBIS [phone]  Aldona Lento 03/16/2020, [facility] [address] [place], [place], [address] Phone: [phone]   Fax:  [phone]  Name: Zachary Murillo. MRN: [id number] Date of Birth: 1950/12/11

## 2020-03-20 ENCOUNTER — Ambulatory Visit (HOSPITAL_COMMUNITY): Payer: Medicare PPO | Admitting: Physical Therapy

## 2020-03-21 ENCOUNTER — Encounter (HOSPITAL_COMMUNITY): Payer: Medicare PPO

## 2020-03-21 ENCOUNTER — Ambulatory Visit (HOSPITAL_COMMUNITY): Payer: Medicare PPO | Admitting: Physical Therapy

## 2020-03-21 ENCOUNTER — Ambulatory Visit (HOSPITAL_COMMUNITY): Payer: Medicare PPO

## 2020-03-21 ENCOUNTER — Encounter (HOSPITAL_COMMUNITY): Payer: Self-pay

## 2020-03-21 ENCOUNTER — Other Ambulatory Visit: Payer: Self-pay

## 2020-03-21 DIAGNOSIS — M6281 Muscle weakness (generalized): Secondary | ICD-10-CM

## 2020-03-21 DIAGNOSIS — R262 Difficulty in walking, not elsewhere classified: Secondary | ICD-10-CM | POA: Diagnosis not present

## 2020-03-21 DIAGNOSIS — G8929 Other chronic pain: Secondary | ICD-10-CM | POA: Diagnosis not present

## 2020-03-21 DIAGNOSIS — M545 Low back pain, unspecified: Secondary | ICD-10-CM | POA: Diagnosis not present

## 2020-03-21 NOTE — Therapy (Signed)
[facility] [place], [place], [address] Phone: [phone]   Fax:  [phone]  Physical Therapy Treatment  Patient Details  Name: Zachary Murillo. MRN: [id number] Date of Birth: 1950/04/23 Referring Provider (PT): Robyne Askew, [place]   Encounter Date: 03/21/2020   PT End of Session - 03/21/20 1003    Visit Number 4    Number of Visits 12    Date for PT Re-Evaluation 04/03/20    Authorization Type [employer]- no visit limit    Authorization Time Period 12 visits approved 12/31-->04/03/20    Progress Note Due on Visit 10    PT Start Time 1002    PT Stop Time 1042    PT Time Calculation (min) 40 min    Activity Tolerance Patient tolerated treatment well;No increased pain    Behavior During Therapy WFL for tasks assessed/performed           Past Medical History:  Diagnosis Date  . Acute ischemic stroke ([place]) 02/22/2013  . CHF (congestive heart failure) ([place])   . CVA (cerebral infarction) 01/22/2013  . Diabetes mellitus ([place])   . Difficulty in walking(719.7) 01/25/2013  . History of Roux-en-Y gastric bypass   . Hypertension   . IMPINGEMENT SYNDROME 01/16/2009   Qualifier: Diagnosis of  By: Aline Brochure MD, Dorothyann Peng    . KNEE PAIN 01/16/2009   Qualifier: Diagnosis of  By: Aline Brochure MD, Dorothyann Peng    . Knee pain, right 09/05/2011  . KNEE, ARTHRITIS, DEGEN./OSTEO 01/30/2009   Qualifier: Diagnosis of  By: Aline Brochure MD, Dorothyann Peng    . Lack of coordination 01/22/2013  . Non-ischemic cardiomyopathy ([place])   . Osteoarthritis of knee   . Other obesity due to excess calories 01/30/2015  . Overweight(278.02)   . Pulmonary hypertension ([place])   . 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 12/2014, Dr. Flavia Shipper  . S/P lumbar fusion 07/02/2013  . SHOULDER PAIN 01/16/2009   Qualifier: Diagnosis of  By: Aline Brochure MD, Dorothyann Peng    . Shoulder pain, right   . Sleep apnea   . Sleep apnea   .  Spinal stenosis   . Stroke ([place])   . TENDINITIS, LEFT KNEE 01/16/2009   Qualifier: Diagnosis of  By: Aline Brochure MD, Dorothyann Peng    . Vitamin D deficiency     Past Surgical History:  Procedure Laterality Date  . BACK SURGERY    . CARDIAC CATHETERIZATION    . KNEE ARTHROSCOPY    . NASAL SEPTUM SURGERY      There were no vitals filed for this visit.   Subjective Assessment - 03/21/20 1001    Subjective Pt reports he did a lot of yardwork Saturday, was tired.  Reports he twisted getting out of shower with some pain following.  Reports back feels good today, has been completing self messages with tennis ball at home with relief.    Patient Stated Goals Have my back feel better and get back to [facility]    Currently in Pain? No/denies                             [facility] Adult PT Treatment/Exercise - 03/21/20 0001      Ambulation/Gait   Ambulation Distance (Feet) 254 Feet    Ambulation Surface Level;Indoor    Gait Comments cueing for ankle mechanics and arm swing      Exercises  Exercises Lumbar      Lumbar Exercises: Stretches   Other Lumbar Stretch Exercise seated QL wihtout UE overhead (Rt shoulder impaired)      Lumbar Exercises: Standing   Heel Raises 10 reps;3 seconds    Functional Squats Limitations squat lifting cone from floor 10    Other Standing Lumbar Exercises 3D hip excursion 10x (lateral shift, rotate, squat with UE support, extension)    Other Standing Lumbar Exercises marching alternating 10x 3"; hip abd 10x3" with ab set      Lumbar Exercises: Seated   Sit to Stand 10 reps    Sit to Stand Limitations NBOS                    PT Short Term Goals - 02/21/20 1618      PT SHORT TERM GOAL #1   Title Patient will demo decreased pain and improved functional strength as evidenced by modified Indepedent supine to sit transfer    Baseline Min A supine to sit    Time 3    Period Weeks    Status New    Target Date 03/13/20      PT SHORT TERM  GOAL #2   Title Patient will ambulate 400 ft during 2MWT and pain not exceeding 2/10 in lumbar spine    Baseline 265 ft during 2MWT and 4/10 lumbar pain    Time 3    Period Weeks    Status New    Target Date 03/13/20      PT SHORT TERM GOAL #3   Title Patient will demo ability to bend/stoop/or squat to retrieve item from ground without UE support    Baseline external support/UE support required to pick up item from floor    Time 3    Period Weeks    Target Date 03/13/20             PT Long Term Goals - 02/21/20 1622      PT LONG TERM GOAL #1   Title Patient will be independent in HEP for lumbar/abdominal strengthening to reduce risk for re-injury    Baseline in development    Time 6    Period Weeks    Status New    Target Date 04/03/20      PT LONG TERM GOAL #2   Title Patient will demo decreased risk for falls per time 18 sec 5 Times Sit to Stand Test    Baseline required UE support to transfer sit to stand    Time 6    Period Weeks    Status New    Target Date 04/03/20      PT LONG TERM GOAL #3   Title Patient will improve on FOTO score to meet predicted outcomes to improve functional indepdence    Baseline 44% function    Time 6    Period Weeks    Status New    Target Date 04/03/20                 Plan - 03/21/20 1037    Clinical Impression Statement Added 3D hip excursion for gluteal strengthening and lumbar mobility, reports of lumbar stretch with lateral weight shifting no reports of pain.  Reviewed mechanics with squats for functional gluteal strengthening, pt able to pick up cone from floor was guarded and slow with mechanics wiht reports of feelings off balance.  Progressed to standing core and hip strengthening exercises with UE support required for proper form.  2MWT complete this session with cueing for heel to toe mechanics and appropriate arm swing.  Pt demonstrate weak ankle stability during gait, added marching and heel/toe raises to HEP.  No  reports of pain through session;    Personal Factors and Comorbidities Comorbidity 1;Age;Fitness;Comorbidity 2    Comorbidities obesity, hx of lumbar fusion x 10 years ago    Examination-Activity Limitations Bed Mobility;Bend;Carry;Lift;Stand;Squat    Examination-Participation Restrictions Cleaning;Community Activity;Laundry;Yard Work    Stability/Clinical Decision Making Stable/Uncomplicated    Designer, jewellery Low    Rehab Potential Good    PT Frequency 2x / week    PT Duration 6 weeks    PT Treatment/Interventions ADLs/Self Care Home Management;Aquatic Therapy;Biofeedback;Electrical Stimulation;DME Instruction;Ultrasound;Moist Heat;Gait training;Stair training;Functional mobility training;Therapeutic activities;Therapeutic exercise;Balance training;Patient/family education;Neuromuscular re-education;Manual techniques;Passive range of motion;Taping;Energy conservation;Spinal Manipulations;Joint Manipulations    PT Next Visit Plan Progress core and hip strengtheing.  Add sidestepping.  Continue wiht tx for spasm control paraspinalsm RQL, gentle ROM and LE strengthening.    PT Home Exercise Plan positioning with knees/hips flexed 90/90; 03/14/20: seated QL stretch; 03/16/20: STS and bridge; 1/11: march and heel/toe raises           Patient will benefit from skilled therapeutic intervention in order to improve the following deficits and impairments:  Abnormal gait,Decreased activity tolerance,Decreased mobility,Decreased endurance,Decreased range of motion,Decreased strength,Difficulty walking,Increased muscle spasms,Impaired flexibility,Improper body mechanics,Postural dysfunction,Pain  Visit Diagnosis: Chronic right-sided low back pain without sciatica  Difficulty in walking, not elsewhere classified  Muscle weakness (generalized)     Problem List Patient Active Problem List   Diagnosis Date Noted  . Bilateral sensorineural hearing loss 06/30/2019  . Primary hyperaldosteronism  ([place]) 03/22/2015  . Type 2 diabetes mellitus with vascular disease ([place]) 01/30/2015  . Mixed hyperlipidemia 01/30/2015  . Essential hypertension, benign 01/30/2015  . Vitamin D deficiency 01/30/2015  . Obesity (BMI 30.0-34.9) 01/30/2015  . Malignant hypertension 08/03/2014  . 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  . Lumbar spinal stenosis 07/02/2013  . Nonischemic cardiomyopathy ([place]) 06/21/2013  . DDD (degenerative disc disease), 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/2013  . Pulmonary hypertension ([place]) 03/23/2013  . High aldosterone level 02/19/2013  . Difficulty in walking, not elsewhere classified 01/25/2013  . Cerebral infarction ([place]) 01/22/2013  . Heart failure, systolic and diastolic, chronic ([place]) 99991111  . Abnormal echocardiogram 12/24/2012  . Sleep apnea 12/22/2012  . 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 OA (osteoarthritis) of knee 09/05/2011   Ihor Austin, LPTA/CLT; CBIS [phone]  Aldona Lento 03/21/2020, 1:10 PM  [facility] [address] [place], [place], [address] Phone: [phone]   Fax:  [phone]  Name: Arael Kolterman. MRN: [id number] Date of Birth: 20-Aug-1950

## 2020-03-21 NOTE — Patient Instructions (Signed)
Marching In-Place    Standing straight, alternate bringing knees toward trunk. Bent arms swing alternately. Do 2 sets. Do 10 times per day.  Copyright  VHI. All rights reserved.   Toe / Heel Raise (Standing)    Standing with support, raise heels, then rock back on heels and raise toes.   Repeat 10 times.  Copyright  VHI. All rights reserved.

## 2020-03-23 ENCOUNTER — Ambulatory Visit (HOSPITAL_COMMUNITY): Payer: Medicare PPO

## 2020-03-23 ENCOUNTER — Encounter (HOSPITAL_COMMUNITY): Payer: Self-pay

## 2020-03-23 ENCOUNTER — Other Ambulatory Visit: Payer: Self-pay

## 2020-03-23 DIAGNOSIS — G8929 Other chronic pain: Secondary | ICD-10-CM | POA: Diagnosis not present

## 2020-03-23 DIAGNOSIS — M6281 Muscle weakness (generalized): Secondary | ICD-10-CM | POA: Diagnosis not present

## 2020-03-23 DIAGNOSIS — R262 Difficulty in walking, not elsewhere classified: Secondary | ICD-10-CM

## 2020-03-23 DIAGNOSIS — M545 Low back pain, unspecified: Secondary | ICD-10-CM

## 2020-03-23 NOTE — Therapy (Signed)
[place], [place], [address] Phone: [phone]   Fax:  [phone]  Physical Therapy Treatment  Patient Details  Name: Zachary Murillo. MRN: [id number] Date of Birth: [birth year] Referring Provider (PT): Robyne Askew, [place]   Encounter Date: 03/23/2020   PT End of Session - 03/23/20 1012    Visit Number 5    Number of Visits 12    Date for PT Re-Evaluation 04/03/20    Authorization Type [employer]- no visit limit    Authorization Time Period 12 visits approved 12/31-->04/03/20    Progress Note Due on Visit 10    PT Start Time 1002    PT Stop Time 1041    PT Time Calculation (min) 39 min    Equipment Utilized During Treatment --   SBA during sidestep; tandem stance complete inside // bars   Activity Tolerance Patient tolerated treatment well;No increased pain    Behavior During Therapy WFL for tasks assessed/performed           Past Medical History:  Diagnosis Date  . Acute ischemic stroke ([place]) 02/22/2013  . CHF (congestive heart failure) ([place])   . CVA (cerebral infarction) 01/22/2013  . Diabetes mellitus ([place])   . Difficulty in walking(719.7) 01/25/2013  . History of Roux-en-Y gastric bypass   . Hypertension   . IMPINGEMENT SYNDROME 01/16/2009   Qualifier: Diagnosis of  By: Aline Brochure MD, Dorothyann Peng    . KNEE PAIN 01/16/2009   Qualifier: Diagnosis of  By: Aline Brochure MD, Dorothyann Peng    . Knee pain, right 09/05/2011  . KNEE, ARTHRITIS, DEGEN./OSTEO 01/30/2009   Qualifier: Diagnosis of  By: Aline Brochure MD, Dorothyann Peng    . Lack of coordination 01/22/2013  . Non-ischemic cardiomyopathy ([place])   . Osteoarthritis of knee   . Other obesity due to excess calories 01/30/2015  . Overweight(278.02)   . Pulmonary hypertension ([place])   . 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 12/2014, Dr. Flavia Shipper  . S/P lumbar fusion 07/02/2013  . SHOULDER PAIN 01/16/2009   Qualifier:  Diagnosis of  By: Aline Brochure MD, Dorothyann Peng    . Shoulder pain, right   . Sleep apnea   . Sleep apnea   . Spinal stenosis   . Stroke ([place])   . TENDINITIS, LEFT KNEE 01/16/2009   Qualifier: Diagnosis of  By: Aline Brochure MD, Dorothyann Peng    . Vitamin D deficiency     Past Surgical History:  Procedure Laterality Date  . BACK SURGERY    . CARDIAC CATHETERIZATION    . KNEE ARTHROSCOPY    . NASAL SEPTUM SURGERY      There were no vitals filed for this visit.   Subjective Assessment - 03/23/20 1011    Subjective Pt stated he is feeling good today.  Reports main difficulty with walking up incline slope in his neighborhood.    Patient Stated Goals Have my back feel better and get back to [facility]    Currently in Pain? No/denies                             [facility] Adult PT Treatment/Exercise - 03/23/20 0001      Exercises   Exercises Lumbar      Lumbar Exercises: Aerobic   Tread Mill 1.2 mph with incline slope starting at 2% and increased to 5% x 5 min      Lumbar  Exercises: Standing   Heel Raises 15 reps    Heel Raises Limitations slope    Functional Squats Limitations 3D hip excursion with Lt UE overhead during lateral shifting for Rt QL stretch; rotation; squat lifting cone front of mat, lumbar extension    Lifting From floor;10 reps    Lifting Limitations cone from floor    Other Standing Lumbar Exercises --    Other Standing Lumbar Exercises marching alternating 10x 3"; sidestep 2RT GTB; tandem stance 3x 30" last set with paloff 15x 4 sets GTB                    PT Short Term Goals - 02/21/20 1618      PT SHORT TERM GOAL #1   Title Patient will demo decreased pain and improved functional strength as evidenced by modified Indepedent supine to sit transfer    Baseline Min A supine to sit    Time 3    Period Weeks    Status New    Target Date 03/13/20      PT SHORT TERM GOAL #2   Title Patient will ambulate 400 ft during and pain not exceeding 2/10 in  lumbar spine    Baseline 265 ft during and 4/10 lumbar pain    Time 3    Period Weeks    Status New    Target Date 03/13/20      PT SHORT TERM GOAL #3   Title Patient will demo ability to bend/stoop/or squat to retrieve item from ground without UE support    Baseline external support/UE support required to pick up item from floor    Time 3    Period Weeks    Target Date 03/13/20             PT Long Term Goals - 02/21/20 1622      PT LONG TERM GOAL #1   Title Patient will be independent in HEP for lumbar/abdominal strengthening to reduce risk for re-injury    Baseline in development    Time 6    Period Weeks    Status New    Target Date 04/03/20      PT LONG TERM GOAL #2   Title Patient will demo decreased risk for falls per time 18 sec 5 Times Sit to Stand Test    Baseline required UE support to transfer sit to stand    Time 6    Period Weeks    Status New    Target Date 04/03/20      PT LONG TERM GOAL #3   Title Patient will improve on FOTO score to meet predicted outcomes to improve functional indepdence    Baseline 44% function    Time 6    Period Weeks    Status New    Target Date 04/03/20                 Plan - 03/23/20 1017    Clinical Impression Statement Began gait training on treadmill with incline slope for core and proximal strengthening, pt tolerated well with no c/o pain.  Verbal cueing and demonstration to improve gait mechanics.  Continued functional strengthening exercises for proximal strengthening and stretches for lower back.  Added sidestep for GTB and paloff in tandem stance for gluteal and core strengthening.  No reports of pain through session, pt tolerated challenging exercises well.    Personal Factors and Comorbidities Comorbidity 1;Age;Fitness;Comorbidity 2    Comorbidities obesity,  hx of lumbar fusion x 10 years ago    Examination-Activity Limitations Bed Mobility;Bend;Carry;Lift;Stand;Squat    Examination-Participation  Restrictions Cleaning;Community Activity;Laundry;Yard Work    Stability/Clinical Decision Making Stable/Uncomplicated    Designer, jewellery Low    Rehab Potential Good    PT Frequency 2x / week    PT Duration 6 weeks    PT Treatment/Interventions ADLs/Self Care Home Management;Aquatic Therapy;Biofeedback;Electrical Stimulation;DME Instruction;Ultrasound;Moist Heat;Gait training;Stair training;Functional mobility training;Therapeutic activities;Therapeutic exercise;Balance training;Patient/family education;Neuromuscular re-education;Manual techniques;Passive range of motion;Taping;Energy conservation;Spinal Manipulations;Joint Manipulations    PT Next Visit Plan Progress core and hip strengtheing.  Add sidestepping.  Continue wiht tx for spasm control paraspinalsm RQL, gentle ROM and LE strengthening.    PT Home Exercise Plan positioning with knees/hips flexed 90/90; 03/14/20: seated QL stretch; 03/16/20: STS and bridge; 1/11: march and heel/toe raises           Patient will benefit from skilled therapeutic intervention in order to improve the following deficits and impairments:  Abnormal gait,Decreased activity tolerance,Decreased mobility,Decreased endurance,Decreased range of motion,Decreased strength,Difficulty walking,Increased muscle spasms,Impaired flexibility,Improper body mechanics,Postural dysfunction,Pain  Visit Diagnosis: Chronic right-sided low back pain without sciatica  Difficulty in walking, not elsewhere classified  Muscle weakness (generalized)     Problem List Patient Active Problem List   Diagnosis Date Noted  . Bilateral sensorineural hearing loss 06/30/2019  . Primary hyperaldosteronism ([place]) 03/22/2015  . Type 2 diabetes mellitus with vascular disease ([place]) 01/30/2015  . Mixed hyperlipidemia 01/30/2015  . Essential hypertension, benign 01/30/2015  . Vitamin D deficiency 01/30/2015  . Obesity (BMI 30.0-34.9) 01/30/2015  . Malignant hypertension 08/03/2014   . 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  . Lumbar spinal stenosis 07/02/2013  . Nonischemic cardiomyopathy ([place]) 06/21/2013  . DDD (degenerative disc disease), 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/2013  . Pulmonary hypertension ([place]) 03/23/2013  . High aldosterone level 02/19/2013  . Difficulty in walking, not elsewhere classified 01/25/2013  . Cerebral infarction ([place]) 01/22/2013  . Heart failure, systolic and diastolic, chronic ([place]) 73/53/2992  . Abnormal echocardiogram 12/24/2012  . Sleep apnea 12/22/2012  . 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 OA (osteoarthritis) of knee 09/05/2011   Ihor Austin, LPTA/CLT; CBIS [phone]  Aldona Lento 03/23/2020, 10:48 AM  [facility] [address] [place], [place], [address] Phone: [phone]   Fax:  [phone]  Name: Ashrith Sagan. MRN: [id number] Date of Birth: [birth year]

## 2020-03-27 ENCOUNTER — Ambulatory Visit (HOSPITAL_COMMUNITY): Payer: Medicare PPO | Admitting: Physical Therapy

## 2020-03-28 ENCOUNTER — Encounter (HOSPITAL_COMMUNITY): Payer: Medicare PPO

## 2020-03-30 ENCOUNTER — Ambulatory Visit (HOSPITAL_COMMUNITY): Payer: Medicare PPO

## 2020-03-30 ENCOUNTER — Encounter (HOSPITAL_COMMUNITY): Payer: Self-pay

## 2020-03-30 ENCOUNTER — Other Ambulatory Visit: Payer: Self-pay

## 2020-03-30 DIAGNOSIS — M545 Low back pain, unspecified: Secondary | ICD-10-CM | POA: Diagnosis not present

## 2020-03-30 DIAGNOSIS — M6281 Muscle weakness (generalized): Secondary | ICD-10-CM

## 2020-03-30 DIAGNOSIS — G8929 Other chronic pain: Secondary | ICD-10-CM | POA: Diagnosis not present

## 2020-03-30 DIAGNOSIS — R262 Difficulty in walking, not elsewhere classified: Secondary | ICD-10-CM | POA: Diagnosis not present

## 2020-03-30 NOTE — Therapy (Addendum)
[facility] [address] [place], [place], [address] Phone: [phone]   Fax:  [phone]  Physical Therapy Treatment and Discharge Note  Patient Details  Name: Zachary Murillo. MRN: [id number] Date of Birth: January 04, 1951 Referring Provider (PT): Robyne Askew, [place]   PHYSICAL THERAPY DISCHARGE SUMMARY  Visits from Start of Care: 6  Current functional level related to goals / functional outcomes: All goals met   Remaining deficits: none   Education / Equipment: On HEP   Plan: Patient agrees to discharge.  Patient goals were met.                                                  meeting the stated rehab goals.  ?????    1:18 PM, 03/30/20 Jerene Pitch, DPT Physical Therapy with [facility]  [phone] office    Encounter Date: 03/30/2020   PT End of Session - 03/30/20 1006    Visit Number 6    Number of Visits 12    Date for PT Re-Evaluation 04/03/20    Authorization Type [employer]- no visit limit    Authorization Time Period 12 visits approved 12/31-->04/03/20    Progress Note Due on Visit 10    PT Start Time 1002    PT Stop Time 1041    PT Time Calculation (min) 39 min    Activity Tolerance Patient tolerated treatment well;No increased pain    Behavior During Therapy WFL for tasks assessed/performed           Past Medical History:  Diagnosis Date  . Acute ischemic stroke ([place]) 02/22/2013  . CHF (congestive heart failure) ([place])   . CVA (cerebral infarction) 01/22/2013  . Diabetes mellitus ([place])   . Difficulty in walking(719.7) 01/25/2013  . History of Roux-en-Y gastric bypass   . Hypertension   . IMPINGEMENT SYNDROME 01/16/2009   Qualifier: Diagnosis of  By: Aline Brochure MD, Dorothyann Peng    . KNEE PAIN 01/16/2009   Qualifier: Diagnosis of  By: Aline Brochure MD, Dorothyann Peng    . Knee pain, right 09/05/2011  . KNEE, ARTHRITIS, DEGEN./OSTEO 01/30/2009   Qualifier: Diagnosis of  By: Aline Brochure MD, Dorothyann Peng     . Lack of coordination 01/22/2013  . Non-ischemic cardiomyopathy ([place])   . Osteoarthritis of knee   . Other obesity due to excess calories 01/30/2015  . Overweight(278.02)   . Pulmonary hypertension ([place])   . 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 12/2014, Dr. Flavia Shipper  . S/P lumbar fusion 07/02/2013  . SHOULDER PAIN 01/16/2009   Qualifier: Diagnosis of  By: Aline Brochure MD, Dorothyann Peng    . Shoulder pain, right   . Sleep apnea   . Sleep apnea   . Spinal stenosis   . Stroke ([place])   . TENDINITIS, LEFT KNEE 01/16/2009   Qualifier: Diagnosis of  By: Aline Brochure MD, Dorothyann Peng    . Vitamin D deficiency     Past Surgical History:  Procedure Laterality Date  . BACK SURGERY    . CARDIAC CATHETERIZATION    . KNEE ARTHROSCOPY    . NASAL SEPTUM SURGERY      There were no vitals filed for this visit.   Subjective Assessment - 03/30/20 1005    Subjective Pt stated he is feeling good today, no real  pain does feel some tightness in lower back Rt side.  Reports improved confidence walking in community following last session.    Patient Stated Goals Have my back feel better and get back to [facility]    Currently in Pain? No/denies              [facility] PT Assessment - 03/30/20 0001      Assessment   Medical Diagnosis LBP    Referring Provider (PT) Robyne Askew, PA      Observation/Other Assessments   Focus on Therapeutic Outcomes (FOTO)  63% functional   was 44% functional     Transfers   Transfers Sit to Stand    Five time sit to stand comments  no UE support    Comments 5STS in 14.55" no HHA, standard height      Ambulation/Gait   Ambulation/Gait Yes    Ambulation/Gait Assistance 6: Modified independent (Device/Increase time)    Ambulation Distance (Feet) 408 Feet   was 254   Assistive device None    Gait Pattern Shuffle;Step-through pattern    Ambulation Surface Level;Indoor    Gait Comments 2MWT                          [facility] Adult PT Treatment/Exercise - 03/30/20 0001      Lumbar Exercises: Aerobic   Tread Mill 1.5 incline slope 6% x 5 min      Lumbar Exercises: Standing   Heel Raises 10 reps    Heel Raises Limitations squat then heel raise 5" holds    Functional Squats Limitations 3D hip excursion with Lt UE overhead during lateral shifting for Rt QL stretch; rotation; squat lifting cone front of mat, lumbar extension    Lifting From floor;10 reps    Lifting Limitations yellow weighted ball 2Klg from floor    Other Standing Lumbar Exercises sidestep with GTB                    PT Short Term Goals - 03/30/20 1017      PT SHORT TERM GOAL #1   Title Patient will demo decreased pain and improved functional strength as evidenced by modified Indepedent supine to sit transfer    Baseline 03/30/20:  Reports ability to independelty get in and out of bed with no difficulty and no pain.    Status Achieved      PT SHORT TERM GOAL #2   Title Patient will ambulate 400 ft during 2MWT and pain not exceeding 2/10 in lumbar spine    Baseline 03/30/20: 48f in 2MWT iwth no reports of increased pain; was 265 ft during 2MWT and 4/10 lumbar pain    Status Achieved      PT SHORT TERM GOAL #3   Title Patient will demo ability to bend/stoop/or squat to retrieve item from ground without UE support    Baseline 03/30/20:  Reports ability to lift light stuff off floor without UE support required; Was: external support/UE support required to pick up item from floor    Status Achieved             PT Long Term Goals - 03/30/20 1019      PT LONG TERM GOAL #1   Title Patient will be independent in HEP for lumbar/abdominal strengthening to reduce risk for re-injury    Baseline 03/30/20: Reports compliance with HEP daily    Status Achieved      PT LONG TERM  GOAL #2   Title Patient will demo decreased risk for falls per time 18 sec 5 Times Sit to Stand Test    Baseline 04/01/18:  5STS in 14.55" standard height no UE support required; required UE support to transfer sit to stand    Status Achieved      PT LONG TERM GOAL #3   Title Patient will improve on FOTO score to meet predicted outcomes to improve functional indepdence    Baseline 03/30/20: 63% functional; (was 44% function)    Status Achieved                 Plan - 03/30/20 1043    Clinical Impression Statement Pt reports he is doing good and feels ready for DC, reviewed goals with the following findings:  Pt has met 3/3 STG and 3/3 LTGs.  FOTO score improved from 44% to 63% indicating improved self perceived functional abilities.  Reports daily compliance with advanced HEP.  Pt able to demonstrate appropriate lifting mechanics with no reports of pain and no UE support required.  Improved gluteal strengthening with functional activities including 5 STS in 14.55" and ability to ambulate 438f during 2MWT, no pain. Pt does continue to demonstrate ankle instability during gait.  Reviewed current exercises to continue at home including heel raises, 3D hip excursion, sidestep and balance exercises, pt verbalized understanding and reports he has printout of all the exercises.  DC to HEP per all goal met.    Personal Factors and Comorbidities Comorbidity 1;Age;Fitness;Comorbidity 2    Comorbidities obesity, hx of lumbar fusion x 10 years ago    Examination-Activity Limitations Bed Mobility;Bend;Carry;Lift;Stand;Squat    Examination-Participation Restrictions Cleaning;Community Activity;Laundry;Yard Work    Stability/Clinical Decision Making Stable/Uncomplicated    CDesigner, jewelleryLow    Rehab Potential Good    PT Frequency 2x / week    PT Duration 6 weeks    PT Treatment/Interventions ADLs/Self Care Home Management;Aquatic Therapy;Biofeedback;Electrical Stimulation;DME Instruction;Ultrasound;Moist Heat;Gait training;Stair training;Functional mobility training;Therapeutic activities;Therapeutic  exercise;Balance training;Patient/family education;Neuromuscular re-education;Manual techniques;Passive range of motion;Taping;Energy conservation;Spinal Manipulations;Joint Manipulations    PT Next Visit Plan DC to HEP per goals met.    PT Home Exercise Plan positioning with knees/hips flexed 90/90; 03/14/20: seated QL stretch; 03/16/20: STS and bridge; 1/11: march and heel/toe raises; 03/30/20: sidestep wtih GTB down hallway           Patient will benefit from skilled therapeutic intervention in order to improve the following deficits and impairments:  Abnormal gait,Decreased activity tolerance,Decreased mobility,Decreased endurance,Decreased range of motion,Decreased strength,Difficulty walking,Increased muscle spasms,Impaired flexibility,Improper body mechanics,Postural dysfunction,Pain  Visit Diagnosis: Chronic right-sided low back pain without sciatica  Difficulty in walking, not elsewhere classified  Muscle weakness (generalized)     Problem List Patient Active Problem List   Diagnosis Date Noted  . Bilateral sensorineural hearing loss 06/30/2019  . Primary hyperaldosteronism ([place] 03/22/2015  . Type 2 diabetes mellitus with vascular disease ([place] 01/30/2015  . Mixed hyperlipidemia 01/30/2015  . Essential hypertension, benign 01/30/2015  . Vitamin D deficiency 01/30/2015  . Obesity (BMI 30.0-34.9) 01/30/2015  . Malignant hypertension 08/03/2014  . 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  . Lumbar spinal stenosis 07/02/2013  . Nonischemic cardiomyopathy ([place] 06/21/2013  . DDD (degenerative disc disease), 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/2013  . Pulmonary hypertension ([place] 03/23/2013  . High aldosterone level 02/19/2013  . Difficulty in walking, not elsewhere classified 01/25/2013  . Cerebral infarction ([place] 01/22/2013  . Heart failure, systolic and diastolic, chronic ([place] 183/15/1761 . Abnormal echocardiogram 12/24/2012  . Sleep  apnea 12/22/2012  . 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 OA (osteoarthritis) of knee 09/05/2011   Ihor Austin, LPTA/CLT; CBIS [phone]  Aldona Lento 03/30/2020, 11:19 AM  [facility] [address] [place], [place], [address] Phone: [phone]   Fax:  [phone]  Name: Drake Wuertz. MRN: [id number] Date of Birth: 1951-02-02

## 2020-04-10 ENCOUNTER — Other Ambulatory Visit: Payer: Self-pay | Admitting: Nurse Practitioner

## 2020-04-10 DIAGNOSIS — E1159 Type 2 diabetes mellitus with other circulatory complications: Secondary | ICD-10-CM

## 2020-04-13 DIAGNOSIS — I639 Cerebral infarction, unspecified: Secondary | ICD-10-CM | POA: Diagnosis not present

## 2020-04-13 DIAGNOSIS — Z95818 Presence of other cardiac implants and grafts: Secondary | ICD-10-CM | POA: Diagnosis not present

## 2020-04-24 DIAGNOSIS — E1169 Type 2 diabetes mellitus with other specified complication: Secondary | ICD-10-CM | POA: Diagnosis not present

## 2020-04-24 DIAGNOSIS — E1159 Type 2 diabetes mellitus with other circulatory complications: Secondary | ICD-10-CM | POA: Diagnosis not present

## 2020-04-24 DIAGNOSIS — Z8673 Personal history of transient ischemic attack (TIA), and cerebral infarction without residual deficits: Secondary | ICD-10-CM | POA: Diagnosis not present

## 2020-04-24 DIAGNOSIS — Z1211 Encounter for screening for malignant neoplasm of colon: Secondary | ICD-10-CM | POA: Diagnosis not present

## 2020-04-25 ENCOUNTER — Other Ambulatory Visit: Payer: Self-pay

## 2020-04-25 ENCOUNTER — Ambulatory Visit: Payer: Medicare PPO | Admitting: Nurse Practitioner

## 2020-04-25 ENCOUNTER — Encounter (INDEPENDENT_AMBULATORY_CARE_PROVIDER_SITE_OTHER): Payer: Self-pay | Admitting: Ophthalmology

## 2020-04-25 ENCOUNTER — Ambulatory Visit (INDEPENDENT_AMBULATORY_CARE_PROVIDER_SITE_OTHER): Payer: Medicare PPO | Admitting: Ophthalmology

## 2020-04-25 DIAGNOSIS — D3132 Benign neoplasm of left choroid: Secondary | ICD-10-CM | POA: Insufficient documentation

## 2020-04-25 DIAGNOSIS — Z961 Presence of intraocular lens: Secondary | ICD-10-CM

## 2020-04-25 NOTE — Assessment & Plan Note (Signed)
The nature of choroidal nevus was discussed with the patient and an informational form was offered.  Photo documentation was discussed with the patient.  Periodic follow-up may be needed for a lifetime. The patient's questions were answered. At minimum, annual exams will be needed if no signs of early growth.  OS, stable choroidal nevus, with no high risk features, no change in size, no lipofuscin, no atrophy, no vascularity, no fluid

## 2020-04-25 NOTE — Progress Notes (Signed)
04/25/2020     CHIEF COMPLAINT Patient presents for Retina Follow Up (1 Year F/U OU//Pt denies noticeable changes to [place] OU since last visit. Pt denies ocular pain, flashes of light, or floaters OU. /A1c: 5.9, 6 months ago/LBS: does not check anymore//)   HISTORY OF PRESENT ILLNESS: Zachary Murillo. is a 71 y.o. male who presents to the clinic today for:   HPI    Retina Follow Up    Patient presents with  Other.  In both eyes.  This started 1 year ago.  Severity is mild.  Duration of 1 year.  Since onset it is stable. Additional comments: 1 Year F/U OU  Pt denies noticeable changes to [place] OU since last visit. Pt denies ocular pain, flashes of light, or floaters OU.  A1c: 5.9, 6 months ago LBS: does not check anymore         Last edited by Rockie Neighbours, Lena on 04/25/2020  9:50 AM. (History)      Referring physician: No referring provider defined for this encounter.  HISTORICAL INFORMATION:   Selected notes from the MEDICAL RECORD NUMBER    Lab Results  Component Value Date   HGBA1C 7.5 (H) 04/24/2020     CURRENT MEDICATIONS: No current outpatient medications on file. (Ophthalmic Drugs)   No current facility-administered medications for this visit. (Ophthalmic Drugs)   Current Outpatient Medications (Other)  Medication Sig  . amLODipine (NORVASC) 10 MG tablet Take 10 mg by mouth daily.  . Ascorbic Acid (VITAMIN C) 1000 MG tablet Take 1,000 mg by mouth daily.  Marland Kitchen atorvastatin (LIPITOR) 10 MG tablet TAKE (1) TABLET BY MOUTH AT BEDTIME.  . Calcium Citrate-Vitamin D (CALCIUM + D PO) Take by mouth 2 (two) times daily.  . cetirizine (ZYRTEC) 10 MG tablet Take 10 mg by mouth daily as needed for allergies.   . chlorthalidone (HYGROTON) 25 MG tablet Take 12.5 mg by mouth daily.   . Cholecalciferol (VITAMIN D) 2000 units CAPS Take by mouth.  . clopidogrel (PLAVIX) 75 MG tablet Take 1 tablet (75 mg total) by mouth daily.  Marland Kitchen docusate sodium (COLACE) 100 MG capsule Take 100 mg  by mouth 2 (two) times daily.  Marland Kitchen lisinopril (PRINIVIL,ZESTRIL) 40 MG tablet Take 40 mg by mouth every morning.  . metFORMIN (GLUCOPHAGE) 500 MG tablet TAKE 1 TABLET BY MOUTH ONCE AFTER BREAKFAST  . Multiple Vitamins-Minerals (CENTRUM PO) Take 1 tablet by mouth every morning.  . sertraline (ZOLOFT) 50 MG tablet Take 1 tablet (50 mg total) by mouth daily.  Marland Kitchen triamcinolone (NASACORT) 55 MCG/ACT AERO nasal inhaler Place 2 sprays into the nose daily.  . TRULICITY 1.5 DX/4.1OI SOPN INJECT 1.5MG  INTO THE SKIN ONCE WEEKLY.   No current facility-administered medications for this visit. (Other)      REVIEW OF SYSTEMS:    ALLERGIES Allergies  Allergen Reactions  . Prednisone     Starts itching    PAST MEDICAL HISTORY Past Medical History:  Diagnosis Date  . Acute ischemic stroke ([place]) 02/22/2013  . CHF (congestive heart failure) ([place])   . CVA (cerebral infarction) 01/22/2013  . Diabetes mellitus ([place])   . Difficulty in walking(719.7) 01/25/2013  . History of Roux-en-Y gastric bypass   . Hypertension   . IMPINGEMENT SYNDROME 01/16/2009   Qualifier: Diagnosis of  By: Aline Brochure MD, Dorothyann Peng    . KNEE PAIN 01/16/2009   Qualifier: Diagnosis of  By: Aline Brochure MD, Dorothyann Peng    . Knee pain, right 09/05/2011  .  KNEE, ARTHRITIS, DEGEN./OSTEO 01/30/2009   Qualifier: Diagnosis of  By: Aline Brochure MD, Dorothyann Peng    . Lack of coordination 01/22/2013  . Non-ischemic cardiomyopathy ([place])   . Osteoarthritis of knee   . Other obesity due to excess calories 01/30/2015  . Overweight(278.02)   . Pulmonary hypertension ([place])   . 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 12/2014, Dr. Flavia Shipper  . S/P lumbar fusion 07/02/2013  . SHOULDER PAIN 01/16/2009   Qualifier: Diagnosis of  By: Aline Brochure MD, Dorothyann Peng    . Shoulder pain, right   . Sleep apnea   . Sleep apnea   . Spinal stenosis   . Stroke ([place])   . TENDINITIS, LEFT KNEE 01/16/2009   Qualifier: Diagnosis of   By: Aline Brochure MD, Dorothyann Peng    . Vitamin D deficiency    Past Surgical History:  Procedure Laterality Date  . BACK SURGERY    . CARDIAC CATHETERIZATION    . KNEE ARTHROSCOPY    . NASAL SEPTUM SURGERY      FAMILY HISTORY Family History  Problem Relation Age of Onset  . Heart disease Other   . Arthritis Other   . Cancer Other   . Asthma Other   . Diabetes Other   . Breast cancer Mother   . Heart disease Mother   . Heart disease Father   . Stroke Father   . Cancer Sister   . Heart disease Brother   . COPD Sister     SOCIAL HISTORY Social History   Tobacco Use  . Smoking status: Never Smoker  . Smokeless tobacco: Never Used  Vaping Use  . Vaping Use: Never used  Substance Use Topics  . Alcohol use: Yes    Comment: occ  . Drug use: No         OPHTHALMIC EXAM: Base Eye Exam    Visual Acuity (ETDRS)      Right Left   Dist [place] 20/25 +2 20/20 -1       Tonometry (Tonopen, 9:45 AM)      Right Left   Pressure 5 6       Pupils      Dark Light Shape React APD   Right 4 3 Round Brisk None   Left 3 2 Round Brisk None       Visual Fields (Counting fingers)      Left Right    Full Full       Extraocular Movement      Right Left    Full Full       Neuro/Psych    Oriented x3: Yes   Mood/Affect: Normal       Dilation    Both eyes: 1.0% Mydriacyl, 2.5% Phenylephrine @ 9:49 AM        Slit Lamp and Fundus Exam    External Exam      Right Left   External Normal Normal       Slit Lamp Exam      Right Left   Lids/Lashes Normal Normal   Conjunctiva/Sclera White and quiet White and quiet   Cornea Clear Clear   Anterior Chamber Deep and quiet Deep and quiet   Iris Round and reactive Round and reactive   Lens Centered posterior chamber intraocular lens Centered posterior chamber intraocular lens   Anterior Vitreous Normal Normal       Fundus Exam      Right Left   Posterior Vitreous ,,  Posterior vitreous detachment Posterior vitreous detachment    Disc Normal Normal   C/D Ratio 0.3 0.3   Macula Normal    Vessels Normal    Periphery Normal Choroidal nevus, nasal, at anterior equator, 9 meridian, 5 x 5 disc diameter, nearly flat lesion, no fluid, no atrophy, no lipofuscin, no vascularity, 28 diopter and 20 diopter lens exam, no contact ora serrata          IMAGING AND PROCEDURES  Imaging and Procedures for 04/25/20  Color Fundus Photography Optos - OU - Both Eyes       Right Eye Progression has been stable. Disc findings include normal observations. Macula : normal observations. Vessels : normal observations. Periphery : normal observations.   Left Eye Progression has been stable. Disc findings include normal observations. Macula : normal observations. Vessels : normal observations.   Notes Choroidal nevus nasally, no subretinal fluid, no interval change over time                ASSESSMENT/PLAN:  Choroidal nevus of left eye The nature of choroidal nevus was discussed with the patient and an informational form was offered.  Photo documentation was discussed with the patient.  Periodic follow-up may be needed for a lifetime. The patient's questions were answered. At minimum, annual exams will be needed if no signs of early growth.  OS, stable choroidal nevus, with no high risk features, no change in size, no lipofuscin, no atrophy, no vascularity, no fluid      ICD-10-CM   1. Choroidal nevus of left eye  D31.32 Color Fundus Photography Optos - OU - Both Eyes  2. Pseudophakia  Z96.1     1. No change in size or appearance left eye over the last documented 2 years. We will continue to monitor  2. No high risk features  3.  Ophthalmic Meds Ordered this visit:  No orders of the defined types were placed in this encounter.      Return in about 1 year (around 04/25/2021) for DILATE OU, COLOR FP.  There are no Patient Instructions on file for this visit.   Explained the diagnoses, plan, and follow up with the  patient and they expressed understanding.  Patient expressed understanding of the importance of proper follow up care.   Clent Demark Rankin M.D. Diseases & Surgery of the Retina and Vitreous [facility] [place] [facility] 04/25/20     Abbreviations: M myopia (nearsighted); A astigmatism; H hyperopia (farsighted); P presbyopia; Mrx spectacle prescription;  CTL contact lenses; OD right eye; OS left eye; OU both eyes  XT exotropia; ET esotropia; PEK punctate epithelial keratitis; PEE punctate epithelial erosions; DES dry eye syndrome; MGD meibomian gland dysfunction; ATs artificial tears; PFAT's preservative free artificial tears; [place] nuclear sclerotic cataract; PSC posterior subcapsular cataract; ERM epi-retinal membrane; PVD posterior vitreous detachment; RD retinal detachment; DM diabetes mellitus; DR diabetic retinopathy; NPDR non-proliferative diabetic retinopathy; PDR proliferative diabetic retinopathy; CSME clinically significant macular edema; DME diabetic macular edema; dbh dot blot hemorrhages; CWS cotton wool spot; POAG primary open angle glaucoma; C/D cup-to-disc ratio; HVF humphrey visual field; GVF goldmann visual field; OCT optical coherence tomography; IOP intraocular pressure; BRVO Branch retinal vein occlusion; CRVO central retinal vein occlusion; CRAO central retinal artery occlusion; BRAO branch retinal artery occlusion; RT retinal tear; SB scleral buckle; PPV pars plana vitrectomy; VH Vitreous hemorrhage; PRP panretinal laser photocoagulation; IVK intravitreal kenalog; VMT vitreomacular traction; MH Macular hole;  NVD neovascularization of the disc; NVE neovascularization elsewhere; AREDS age  related eye disease study; ARMD age related macular degeneration; POAG primary open angle glaucoma; EBMD epithelial/anterior basement membrane dystrophy; ACIOL anterior chamber intraocular lens; IOL intraocular lens; PCIOL posterior chamber intraocular lens; Phaco/IOL phacoemulsification with intraocular  lens placement; [place] photorefractive keratectomy; LASIK laser assisted in situ keratomileusis; HTN hypertension; DM diabetes mellitus; COPD chronic obstructive pulmonary disease

## 2020-04-27 ENCOUNTER — Other Ambulatory Visit: Payer: Self-pay

## 2020-04-27 ENCOUNTER — Ambulatory Visit: Payer: Medicare PPO | Admitting: Nurse Practitioner

## 2020-04-27 ENCOUNTER — Encounter: Payer: Self-pay | Admitting: Nurse Practitioner

## 2020-04-27 VITALS — BP 106/72 | HR 93 | Ht 70.0 in | Wt 232.0 lb

## 2020-04-27 DIAGNOSIS — E1159 Type 2 diabetes mellitus with other circulatory complications: Secondary | ICD-10-CM

## 2020-04-27 DIAGNOSIS — E559 Vitamin D deficiency, unspecified: Secondary | ICD-10-CM

## 2020-04-27 DIAGNOSIS — I10 Essential (primary) hypertension: Secondary | ICD-10-CM

## 2020-04-27 DIAGNOSIS — E782 Mixed hyperlipidemia: Secondary | ICD-10-CM | POA: Diagnosis not present

## 2020-04-27 MED ORDER — METFORMIN HCL 500 MG PO TABS: 500.0000 mg | ORAL_TABLET | Freq: Two times a day (BID) | ORAL | 3 refills | Status: DC

## 2020-04-27 NOTE — Progress Notes (Signed)
04/27/2020  Endocrinology follow-up note   Subjective:    Patient ID: Zachary Shadow., male    DOB: March 17, 1950, PCP Zachary Roles, PA   Past Medical History:  Diagnosis Date  . Acute ischemic stroke ([place]) 02/22/2013  . CHF (congestive heart failure) ([place])   . CVA (cerebral infarction) 01/22/2013  . Diabetes mellitus ([place])   . Difficulty in walking(719.7) 01/25/2013  . History of Roux-en-Y gastric bypass   . Hypertension   . IMPINGEMENT SYNDROME 01/16/2009   Qualifier: Diagnosis of  By: Aline Brochure MD, Dorothyann Peng    . KNEE PAIN 01/16/2009   Qualifier: Diagnosis of  By: Aline Brochure MD, Dorothyann Peng    . Knee pain, right 09/05/2011  . KNEE, ARTHRITIS, DEGEN./OSTEO 01/30/2009   Qualifier: Diagnosis of  By: Aline Brochure MD, Dorothyann Peng    . Lack of coordination 01/22/2013  . Non-ischemic cardiomyopathy ([place])   . Osteoarthritis of knee   . Other obesity due to excess calories 01/30/2015  . Overweight(278.02)   . Pulmonary hypertension ([place])   . 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 12/2014, Dr. Flavia Shipper  . S/P lumbar fusion 07/02/2013  . SHOULDER PAIN 01/16/2009   Qualifier: Diagnosis of  By: Aline Brochure MD, Dorothyann Peng    . Shoulder pain, right   . Sleep apnea   . Sleep apnea   . Spinal stenosis   . Stroke ([place])   . TENDINITIS, LEFT KNEE 01/16/2009   Qualifier: Diagnosis of  By: Aline Brochure MD, Dorothyann Peng    . Vitamin D deficiency    Past Surgical History:  Procedure Laterality Date  . BACK SURGERY    . CARDIAC CATHETERIZATION    . KNEE ARTHROSCOPY    . NASAL SEPTUM SURGERY     Social History   Socioeconomic History  . Marital status: Married    Spouse name: Otila Kluver   . Number of children: 1  . Years of education: Masters  . Highest education level: Not on file  Occupational History  . Occupation: Retired-Drivers Ed   Tobacco Use  . Smoking status: Never Smoker  . Smokeless tobacco: Never Used  Vaping Use  . Vaping Use: Never used   Substance and Sexual Activity  . Alcohol use: Yes    Comment: occ  . Drug use: No  . Sexual activity: Yes    Birth control/protection: Surgical  Other Topics Concern  . 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 amLODipine (NORVASC) 10 MG tablet Take 10 mg by mouth daily.  . Ascorbic Acid (VITAMIN C) 1000 MG tablet Take 1,000 mg by mouth daily.  Marland Kitchen  atorvastatin (LIPITOR) 10 MG tablet TAKE (1) TABLET BY MOUTH AT BEDTIME.  . Calcium Citrate-Vitamin D (CALCIUM + D PO) Take by mouth 2 (two) times daily.  . cetirizine (ZYRTEC) 10 MG tablet Take 10 mg by mouth daily as needed for allergies.   . chlorthalidone (HYGROTON) 25 MG tablet Take 12.5 mg by mouth daily.   . Cholecalciferol (VITAMIN D) 2000 units CAPS Take by mouth.  . clopidogrel (PLAVIX) 75 MG tablet Take 1 tablet (75 mg total) by mouth daily.  Marland Kitchen docusate sodium (COLACE) 100 MG capsule Take 100 mg by mouth 2 (two) times daily.  Marland Kitchen lisinopril (PRINIVIL,ZESTRIL) 40 MG tablet Take 40 mg by mouth every morning.  . Multiple Vitamins-Minerals (CENTRUM PO) Take 1 tablet by mouth every morning.  . sertraline (ZOLOFT) 50 MG tablet Take 1 tablet (50 mg total) by mouth daily.  Marland Kitchen triamcinolone (NASACORT) 55 MCG/ACT AERO nasal inhaler Place 2 sprays into the nose  daily.  . TRULICITY 1.5 DJ/2.4QA SOPN INJECT 1.5MG  INTO THE SKIN ONCE WEEKLY.  . [DISCONTINUED] metFORMIN (GLUCOPHAGE) 500 MG tablet TAKE 1 TABLET BY MOUTH ONCE AFTER BREAKFAST  . metFORMIN (GLUCOPHAGE) 500 MG tablet Take 1 tablet (500 mg total) by mouth 2 (two) times daily with a meal. TAKE 1 TABLET BY MOUTH ONCE AFTER BREAKFAST   No facility-administered encounter medications on file as of 04/27/2020.   ALLERGIES: Allergies  Allergen Reactions  . Prednisone     Starts itching   VACCINATION STATUS: Immunization History  Administered Date(s) Administered  . Influenza, High Dose Seasonal PF 11/29/2016, 01/20/2018  . Influenza, Seasonal, Injecte, Preservative Fre 12/27/2012  . Influenza,inj,Quad PF,6+ Mos 01/06/2015  . Influenza-Unspecified 12/23/2013, 12/25/2015, 11/19/2018  . PFIZER(Purple Top)SARS-COV-2 Vaccination 04/03/2019, 04/24/2019, 12/30/2019  . Zoster 04/25/2014    Diabetes He presents for his follow-up diabetic visit. He has type 2 diabetes mellitus. Onset time: He was diagnosed at approximate age of 61 years. His disease course has been worsening. There are no hypoglycemic associated symptoms. Pertinent negatives for hypoglycemia include no confusion, headaches, pallor or seizures. Pertinent negatives for diabetes include no chest pain, no fatigue, no polydipsia, no polyphagia, no polyuria and no weakness. There are no hypoglycemic complications. Symptoms are stable. Diabetic complications include a CVA and heart disease. Risk factors for coronary artery disease include diabetes mellitus, dyslipidemia, hypertension, male sex, obesity and sedentary lifestyle. Current diabetic treatment includes oral agent (monotherapy) (Trulicity 1.5 weekly). He is compliant with treatment all of the time. His weight is increasing steadily. He is following a generally unhealthy diet. When asked about meal planning, he reported none. He has had a previous visit with a dietitian. He participates in  exercise intermittently. (He presents today with no meter or logs to review (does not keep them due to his simplified medication regimen).  His previsit A1c was 7.5%, increasing from last visit of 6.8%.  His Metformin was decreased at last visit due to concern for hypoglycemia given his tight control.  He denies any s/s of hypoglycemia.  He reports he has not been exercising optimally right now due to the colder weather and has plans to restart soon.) An ACE inhibitor/angiotensin II receptor blocker is being taken. He sees a podiatrist.Eye exam is current.  Hyperlipidemia This is a chronic problem. The current episode started more than 1 year ago. The problem is controlled. Recent lipid tests were reviewed and are normal. Exacerbating diseases include chronic renal disease, diabetes and obesity. Pertinent negatives include no chest pain, myalgias or shortness of breath.  Current antihyperlipidemic treatment includes statins. The current treatment provides moderate improvement of lipids. Compliance problems include adherence to exercise.  Risk factors for coronary artery disease include diabetes mellitus, dyslipidemia, hypertension, male sex, a sedentary lifestyle, obesity and family history.  Hypertension This is a chronic problem. The current episode started more than 1 year ago. The problem has been gradually improving since onset. The problem is controlled. Pertinent negatives include no chest pain, headaches, neck pain, palpitations or shortness of breath. There are no associated agents to hypertension. Risk factors for coronary artery disease include diabetes mellitus, dyslipidemia, male gender, obesity and sedentary lifestyle. Past treatments include ACE inhibitors, diuretics and calcium channel blockers. The current treatment provides moderate improvement. Compliance problems include exercise and diet.  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 Identifiable causes of hypertension include  chronic renal disease and hyperaldosteronism.    Review of systems  Constitutional: + steadily increasing body weight,  current Body mass index is 33.29 kg/m. , no fatigue, no subjective hyperthermia, no subjective hypothermia Eyes: no blurry vision, no xerophthalmia ENT: no sore throat, no nodules palpated in throat, no dysphagia/odynophagia, no hoarseness Cardiovascular: no chest pain, no shortness of breath, no palpitations, no leg swelling Respiratory: no cough, no shortness of breath Gastrointestinal: no nausea/vomiting/diarrhea Musculoskeletal: no muscle/joint aches Skin: no rashes, no hyperemia Neurological: no tremors, no numbness, no tingling, no dizziness Psychiatric: no depression, no anxiety   Objective:    BP 106/72 (BP Location: Right Arm, Patient Position: Sitting)   Pulse 93   Ht 5\' 10"  (1.778 m)   Wt 232 lb (105.2 kg)   BMI 33.29 kg/m   Wt Readings from Last 3 Encounters:  04/27/20 232 lb (105.2 kg)  03/01/20 230 lb (104.3 kg)  08/12/19 224 lb 12.8 oz (102 kg)    BP Readings from Last 3 Encounters:  04/27/20 106/72  03/01/20 123/78  12/15/19 (!) 143/86     Physical Exam- Limited  Constitutional:  Body mass index is 33.29 kg/m. , not in acute distress, normal state of mind Eyes:  EOMI, no exophthalmos Neck: Supple Cardiovascular: RRR, no murmers, rubs, or gallops, no edema Respiratory: Adequate breathing efforts, no crackles, rales, rhonchi, or wheezing Musculoskeletal: no gross deformities, strength intact in all four extremities, no gross restriction of joint movements Skin:  no rashes, no hyperemia Neurological: no tremor with outstretched hands    Results for orders placed or performed in visit on 12/15/19  Hemoglobin A1c  Result Value Ref Range   Hgb A1c MFr Bld 7.5 (H) 4.8 - 5.6 %   Est. average glucose Bld gHb Est-mCnc 169 mg/dL  Microalbumin / creatinine urine ratio  Result Value Ref Range   Creatinine, Urine 86.6 Not Estab. mg/dL    Microalbumin, Urine 4.7 Not Estab. ug/mL   Microalb/Creat Ratio 5 0 - 300 mg/g creat  Comprehensive metabolic panel  Result Value Ref Range   Glucose 123 (H) 65 - 99 mg/dL   BUN 21 8 - 27 mg/dL   Creatinine, Ser 0.88 0.76 - 1.27 mg/dL   GFR calc non Af Amer 87 >59 mL/min/1.73   GFR calc Af Amer 101 >59 mL/min/1.73   BUN/Creatinine Ratio 24 10 - 24   Sodium 144 134 - 144 mmol/L   Potassium 4.4 3.5 - 5.2 mmol/L   Chloride 101 96 - 106 mmol/L   CO2 28 20 - 29 mmol/L   Calcium 10.0 8.6 - 10.2 mg/dL   Total Protein 7.1 6.0 - 8.5 g/dL   Albumin  4.4 3.8 - 4.8 g/dL   Globulin, Total 2.7 1.5 - 4.5 g/dL   Albumin/Globulin Ratio 1.6 1.2 - 2.2   Bilirubin Total 0.9 0.0 - 1.2 mg/dL   Alkaline Phosphatase 154 (H) 44 - 121 IU/L   AST 27 0 - 40 IU/L   ALT 26 0 - 44 IU/L  HgB A1c  Result Value Ref Range   Hemoglobin A1C 6.8 (A) 4.0 - 5.6 %   HbA1c POC (<> result, manual entry)     HbA1c, POC (prediabetic range)     HbA1c, POC (controlled diabetic range)     Diabetic Labs (most recent): Lab Results  Component Value Date   HGBA1C 7.5 (H) 04/24/2020   HGBA1C 6.8 (A) 12/15/2019   HGBA1C 6.9 (A) 08/12/2019   CMP Latest Ref Rng & Units 04/24/2020 12/09/2019 02/02/2019  Glucose 65 - 99 mg/dL 123(H) 119(H) 125(H)  BUN 8 - 27 mg/dL 21 23 19   Creatinine 0.76 - 1.27 mg/dL 0.88 0.91 0.85  Sodium 134 - 144 mmol/L 144 141 142  Potassium 3.5 - 5.2 mmol/L 4.4 3.7 4.0  Chloride 96 - 106 mmol/L 101 99 100  CO2 20 - 29 mmol/L 28 26 27   Calcium 8.6 - 10.2 mg/dL 10.0 9.4 9.5  Total Protein 6.0 - 8.5 g/dL 7.1 6.9 6.9  Total Bilirubin 0.0 - 1.2 mg/dL 0.9 1.2 0.7  Alkaline Phos 44 - 121 IU/L 154(H) 129(H) 151(H)  AST 0 - 40 IU/L 27 24 24   ALT 0 - 44 IU/L 26 27 23    Lipid Panel     Component Value Date/Time   CHOL 122 12/09/2019 0908   TRIG 69 12/09/2019 0908   HDL 47 12/09/2019 0908   CHOLHDL 2.2 01/04/2016 0745   VLDL 13 01/04/2016 0745   LDLCALC 61 12/09/2019 0908   LABVLDL 14 12/09/2019 0908    Results for ADAMA, FERBER (MRN [id number]) as of 08/12/2019 16:33  Ref. Range 04/21/2018 08:34  TSH Latest Ref Range: 0.450 - 4.500 uIU/mL 2.110  T4,Free(Direct) Latest Ref Range: 0.82 - 1.77 ng/dL 1.34    Assessment & Plan:   1) Type 2 diabetes mellitus with vascular disease ([place])  His diabetes is complicated by CVA and patient remains at a high risk for more acute and chronic complications of diabetes which include CAD, CVA, CKD, retinopathy, and neuropathy.  He presents today with no meter or logs to review (does not keep them due to his simplified medication regimen).  His previsit A1c was 7.5%, increasing from last visit of 6.8%.  His Metformin was decreased at last visit due to concern for hypoglycemia given his tight control.  He denies any s/s of hypoglycemia.  He reports he has not been exercising optimally right now due to the colder weather and has plans to restart soon.  -He maintains 135 pounds of weight loss status post his Roux-en-Y bypass surgery, though has recently gained approximately 7-8 lbs.   Recent labs reviewed.  - Nutritional counseling repeated at each appointment due to patients tendency to fall back in to old habits.  - The patient admits there is a room for improvement in their diet and drink choices. -  Suggestion is made for the patient to avoid simple carbohydrates from their diet including Cakes, Sweet Desserts / Pastries, Ice Cream, Soda (diet and regular), Sweet Tea, Candies, Chips, Cookies, Sweet Pastries,  Store Bought Juices, Alcohol in Excess of  1-2 drinks a day, Artificial Sweeteners, Coffee Creamer, and "Sugar-free" Products. This will  help patient to have stable blood glucose profile and potentially avoid unintended weight gain.   - I encouraged the patient to switch to  unprocessed or minimally processed complex starch and increased protein intake (animal or plant source), fruits, and vegetables.   - Patient is advised to stick to a routine  mealtimes to eat 3 meals  a day and avoid unnecessary snacks ( to snack only to correct hypoglycemia).  - I have approached patient with the following individualized plan to manage diabetes and patient agrees.  - After a successful Roux-en-Y bariatric surgery and lost approximately 135 pounds, he was able to come off of multiple daily injections of insulin therapy.  -Given his rebound in his A1c after lowering dose of Metformin, will advise he restart Metformin 500 mg po twice daily with meals.  He is also advised to continue Trulicity 1.5 mg SQ weekly.   - Patient specific target  for A1c; LDL, HDL, Triglycerides, and  Waist Circumference were discussed in detail.  2) BP/HTN:  His blood pressure is controlled to target.  He is advised to continue Norvasc 10 mg po daily, Chlorthalidone 12.5 mg po daily, and Lisinopril 40 mg po daily.    3) Lipids/HPL:  His most recent lipid panel from 12/09/19 shows controlled LDL of 61.  He is advised to continue Lipitor 10 mg po daily.  Side effects and precautions discussed with him.  Will recheck lipid panel prior to next visit.  4)  Weight/Diet:  His Body mass index is 33.29 kg/m. He is status post Roux-en-Y bariatric surgery.  CDE consult in progress, exercise, and carbohydrates information provided.  He is approached to reengage in routine physical activity to prevent further weight gain.  5) Chronic Care/Health Maintenance: -Patient is on ACEI/ARB and Statin medications and encouraged to continue to follow up with Ophthalmology, Podiatrist at least yearly or according to recommendations, and advised to  stay away from smoking. I have recommended yearly flu vaccine and pneumonia vaccination at least every 5 years; moderate intensity exercise for up to 150 minutes weekly; and  sleep for at least 7 hours a day.  6) Vitamin D deficiency -His most recent vitamin D level on 08/06/2018 was 51.  He is advised to continue daily maintenance dose of vitamin D3 for  now.  Will recheck vitamin D level prior to next visit.   - I advised patient to maintain close follow up with his PCP and he is bariatric team for ongoing care needs.  - Time spent on this patient care encounter:  40 min, of which > 50% was spent in  counseling and the rest reviewing his blood glucose logs , discussing his hypoglycemia and hyperglycemia episodes, reviewing his current and  previous labs / studies  ( including abstraction from other facilities) and medications  doses and developing a  long term treatment plan and documenting his care.   Please refer to Patient Instructions for Blood Glucose Monitoring and Insulin/Medications Dosing Guide"  in media tab for additional information. Please  also refer to " Patient Self Inventory" in the Media  tab for reviewed elements of pertinent patient history.  Zachary Shadow. participated in the discussions, expressed understanding, and voiced agreement with the above plans.  All questions were answered to his satisfaction. he is encouraged to contact clinic should he have any questions or concerns prior to his return visit.    Follow up plan: -Return in about 6 months (around 10/25/2020) for Diabetes follow up  with A1c in office, Bring glucometer and logs, Previsit labs.    Rayetta Pigg, [facility] [address] [place], [place] [address] Phone: [phone] Fax: [phone]  04/27/2020, 10:32 AM

## 2020-04-27 NOTE — Patient Instructions (Signed)

## 2020-04-28 ENCOUNTER — Telehealth: Payer: Self-pay | Admitting: Nurse Practitioner

## 2020-04-28 NOTE — Telephone Encounter (Signed)
I spoke with the couple. Switchhis next appointment to 3 months , no labs , just a1c here.

## 2020-04-28 NOTE — Telephone Encounter (Signed)
Pt spouse Wells Guiles called and said she is requesting a call back from Dr. Dorris Fetch because he last seen Whitney and what Loree Fee is asking him to do the patient is not wanting to comply. She said he seems to listen to Dr. Dorris Fetch better and do what he ask but for some reason is not wanting to do what Loree Fee is saying. Pt was seen 2/17  [phone]

## 2020-05-01 LAB — COMPREHENSIVE METABOLIC PANEL
ALT: 26 IU/L (ref 0–44)
AST: 27 IU/L (ref 0–40)
Albumin/Globulin Ratio: 1.6 (ref 1.2–2.2)
Albumin: 4.4 g/dL (ref 3.8–4.8)
Alkaline Phosphatase: 154 IU/L — ABNORMAL HIGH (ref 44–121)
BUN/Creatinine Ratio: 24 (ref 10–24)
BUN: 21 mg/dL (ref 8–27)
Bilirubin Total: 0.9 mg/dL (ref 0.0–1.2)
CO2: 28 mmol/L (ref 20–29)
Calcium: 10 mg/dL (ref 8.6–10.2)
Chloride: 101 mmol/L (ref 96–106)
Creatinine, Ser: 0.88 mg/dL (ref 0.76–1.27)
GFR calc Af Amer: 101 mL/min/{1.73_m2} (ref >59)
GFR calc non Af Amer: 87 mL/min/{1.73_m2} (ref >59)
Globulin, Total: 2.7 g/dL (ref 1.5–4.5)
Glucose: 123 mg/dL — ABNORMAL HIGH (ref 65–99)
Potassium: 4.4 mmol/L (ref 3.5–5.2)
Sodium: 144 mmol/L (ref 134–144)
Total Protein: 7.1 g/dL (ref 6.0–8.5)

## 2020-05-01 LAB — HEMOGLOBIN A1C
Est. average glucose Bld gHb Est-mCnc: 169 mg/dL
Hgb A1c MFr Bld: 7.5 % — ABNORMAL HIGH (ref 4.8–5.6)

## 2020-05-01 LAB — MICROALBUMIN / CREATININE URINE RATIO
Creatinine, Urine: 86.6 mg/dL
Microalb/Creat Ratio: 5 mg/g creat (ref 0–29)
Microalbumin, Urine: 4.7 ug/mL

## 2020-05-08 ENCOUNTER — Other Ambulatory Visit: Payer: Self-pay | Admitting: Nurse Practitioner

## 2020-05-08 DIAGNOSIS — E1159 Type 2 diabetes mellitus with other circulatory complications: Secondary | ICD-10-CM

## 2020-05-15 DIAGNOSIS — I639 Cerebral infarction, unspecified: Secondary | ICD-10-CM | POA: Diagnosis not present

## 2020-05-15 DIAGNOSIS — Z95818 Presence of other cardiac implants and grafts: Secondary | ICD-10-CM | POA: Diagnosis not present

## 2020-05-22 DIAGNOSIS — Z01812 Encounter for preprocedural laboratory examination: Secondary | ICD-10-CM | POA: Diagnosis not present

## 2020-05-25 DIAGNOSIS — Z1211 Encounter for screening for malignant neoplasm of colon: Secondary | ICD-10-CM | POA: Diagnosis not present

## 2020-06-07 ENCOUNTER — Other Ambulatory Visit: Payer: Self-pay | Admitting: Nurse Practitioner

## 2020-06-07 DIAGNOSIS — E1159 Type 2 diabetes mellitus with other circulatory complications: Secondary | ICD-10-CM

## 2020-07-06 ENCOUNTER — Other Ambulatory Visit: Payer: Self-pay | Admitting: Nurse Practitioner

## 2020-07-06 DIAGNOSIS — E1159 Type 2 diabetes mellitus with other circulatory complications: Secondary | ICD-10-CM

## 2020-07-07 DIAGNOSIS — Z23 Encounter for immunization: Secondary | ICD-10-CM | POA: Diagnosis not present

## 2020-07-12 DIAGNOSIS — I639 Cerebral infarction, unspecified: Secondary | ICD-10-CM | POA: Diagnosis not present

## 2020-07-12 DIAGNOSIS — I10 Essential (primary) hypertension: Secondary | ICD-10-CM | POA: Diagnosis not present

## 2020-07-12 DIAGNOSIS — I272 Pulmonary hypertension, unspecified: Secondary | ICD-10-CM | POA: Diagnosis not present

## 2020-07-12 DIAGNOSIS — I428 Other cardiomyopathies: Secondary | ICD-10-CM | POA: Diagnosis not present

## 2020-07-12 DIAGNOSIS — Z95818 Presence of other cardiac implants and grafts: Secondary | ICD-10-CM | POA: Diagnosis not present

## 2020-07-12 DIAGNOSIS — I453 Trifascicular block: Secondary | ICD-10-CM | POA: Diagnosis not present

## 2020-07-12 DIAGNOSIS — I5042 Chronic combined systolic (congestive) and diastolic (congestive) heart failure: Secondary | ICD-10-CM | POA: Diagnosis not present

## 2020-08-02 ENCOUNTER — Other Ambulatory Visit: Payer: Self-pay | Admitting: Nurse Practitioner

## 2020-08-02 DIAGNOSIS — E1159 Type 2 diabetes mellitus with other circulatory complications: Secondary | ICD-10-CM

## 2020-08-04 ENCOUNTER — Ambulatory Visit: Payer: Medicare PPO | Admitting: "Endocrinology

## 2020-08-14 ENCOUNTER — Ambulatory Visit: Payer: Medicare PPO | Admitting: "Endocrinology

## 2020-08-15 DIAGNOSIS — Z961 Presence of intraocular lens: Secondary | ICD-10-CM | POA: Diagnosis not present

## 2020-08-15 DIAGNOSIS — H31102 Choroidal degeneration, unspecified, left eye: Secondary | ICD-10-CM | POA: Diagnosis not present

## 2020-08-15 DIAGNOSIS — E119 Type 2 diabetes mellitus without complications: Secondary | ICD-10-CM | POA: Diagnosis not present

## 2020-08-15 DIAGNOSIS — D3132 Benign neoplasm of left choroid: Secondary | ICD-10-CM | POA: Diagnosis not present

## 2020-08-16 ENCOUNTER — Ambulatory Visit (INDEPENDENT_AMBULATORY_CARE_PROVIDER_SITE_OTHER): Payer: Medicare PPO | Admitting: "Endocrinology

## 2020-08-16 ENCOUNTER — Encounter: Payer: Self-pay | Admitting: "Endocrinology

## 2020-08-16 ENCOUNTER — Other Ambulatory Visit: Payer: Self-pay

## 2020-08-16 VITALS — BP 112/76 | HR 72 | Ht 70.0 in | Wt 228.2 lb

## 2020-08-16 DIAGNOSIS — E559 Vitamin D deficiency, unspecified: Secondary | ICD-10-CM

## 2020-08-16 DIAGNOSIS — E1159 Type 2 diabetes mellitus with other circulatory complications: Secondary | ICD-10-CM | POA: Diagnosis not present

## 2020-08-16 DIAGNOSIS — E782 Mixed hyperlipidemia: Secondary | ICD-10-CM | POA: Diagnosis not present

## 2020-08-16 DIAGNOSIS — I10 Essential (primary) hypertension: Secondary | ICD-10-CM | POA: Diagnosis not present

## 2020-08-16 LAB — POCT GLYCOSYLATED HEMOGLOBIN (HGB A1C): HbA1c, POC (controlled diabetic range): 7.7 % — AB (ref 0.0–7.0)

## 2020-08-16 MED ORDER — ACCU-CHEK GUIDE VI STRP: ORAL_STRIP | 2 refills | Status: DC

## 2020-08-16 MED ORDER — TRULICITY 3 MG/0.5ML ~~LOC~~ SOAJ: 3.0000 mg | SUBCUTANEOUS | 2 refills | Status: DC

## 2020-08-16 MED ORDER — ACCU-CHEK GUIDE ME W/DEVICE KIT: 1.0000 | PACK | 0 refills | Status: AC

## 2020-08-16 NOTE — Progress Notes (Signed)
08/16/2020  Endocrinology follow-up note   Subjective:    Patient ID: Zachary Murillo., male    DOB: 02-21-1951, PCP Johna Roles, PA   Past Medical History:  Diagnosis Date  . Acute ischemic stroke ([place]) 02/22/2013  . CHF (congestive heart failure) ([place])   . CVA (cerebral infarction) 01/22/2013  . Diabetes mellitus ([place])   . Difficulty in walking(719.7) 01/25/2013  . History of Roux-en-Y gastric bypass   . Hypertension   . IMPINGEMENT SYNDROME 01/16/2009   Qualifier: Diagnosis of  By: Aline Brochure MD, Dorothyann Peng    . KNEE PAIN 01/16/2009   Qualifier: Diagnosis of  By: Aline Brochure MD, Dorothyann Peng    . Knee pain, right 09/05/2011  . KNEE, ARTHRITIS, DEGEN./OSTEO 01/30/2009   Qualifier: Diagnosis of  By: Aline Brochure MD, Dorothyann Peng    . Lack of coordination 01/22/2013  . Non-ischemic cardiomyopathy ([place])   . Osteoarthritis of knee   . Other obesity due to excess calories 01/30/2015  . Overweight(278.02)   . Pulmonary hypertension ([place])   . 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 12/2014, Dr. Flavia Shipper  . S/P lumbar fusion 07/02/2013  . SHOULDER PAIN 01/16/2009   Qualifier: Diagnosis of  By: Aline Brochure MD, Dorothyann Peng    . Shoulder pain, right   . Sleep apnea   . Sleep apnea   . Spinal stenosis   . Stroke ([place])   . TENDINITIS, LEFT KNEE 01/16/2009   Qualifier: Diagnosis of  By: Aline Brochure MD, Dorothyann Peng    . Vitamin D deficiency    Past Surgical History:  Procedure Laterality Date  . BACK SURGERY    . CARDIAC CATHETERIZATION    . KNEE ARTHROSCOPY    . NASAL SEPTUM SURGERY     Social History   Socioeconomic History  . Marital status: Married    Spouse name: Otila Kluver   . Number of children: 1  . Years of education: Masters  . Highest education level: Not on file  Occupational History  . Occupation: Retired-Drivers Ed   Tobacco Use  . Smoking status: Never Smoker  . Smokeless tobacco: Never Used  Vaping Use  . Vaping Use: Never used   Substance and Sexual Activity  . Alcohol use: Yes    Comment: occ  . Drug use: No  . Sexual activity: Yes    Birth control/protection: Surgical  Other Topics Concern  . 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 Blood Glucose Monitoring Suppl (ACCU-CHEK GUIDE ME) w/Device KIT 1 Piece by Does not apply route as directed.  . Dulaglutide (TRULICITY) 3 ZC/5.8IF SOPN Inject 3 mg  as directed once a week.  Marland Kitchen glucose blood (ACCU-CHEK GUIDE) test strip Test glucose 1 time a day  . amLODipine (NORVASC) 5 MG tablet Take 1 tablet by mouth daily.  . Ascorbic Acid (VITAMIN C) 1000 MG tablet Take 1,000 mg by mouth daily.  Marland Kitchen atorvastatin (LIPITOR) 10 MG tablet TAKE (1) TABLET BY MOUTH AT BEDTIME.  . Calcium Citrate-Vitamin D (CALCIUM + D PO) Take by mouth 2 (two) times daily.  . cetirizine (ZYRTEC) 10 MG tablet Take 10 mg by mouth daily as needed for allergies.   . chlorthalidone (HYGROTON) 25 MG tablet Take 12.5 mg by mouth daily.   . Cholecalciferol (VITAMIN D) 2000 units CAPS Take by mouth.  . clopidogrel (PLAVIX) 75 MG tablet Take 1 tablet (75 mg total) by mouth daily.  Marland Kitchen docusate sodium (COLACE) 100 MG capsule Take 100 mg by mouth 2 (two) times daily.  Marland Kitchen lisinopril (PRINIVIL,ZESTRIL) 40 MG tablet Take 40 mg by  mouth every morning.  . metFORMIN (GLUCOPHAGE) 500 MG tablet Take 1 tablet (500 mg total) by mouth 2 (two) times daily with a meal. TAKE 1 TABLET BY MOUTH ONCE AFTER BREAKFAST  . Multiple Vitamins-Minerals (CENTRUM PO) Take 1 tablet by mouth every morning.  . sertraline (ZOLOFT) 50 MG tablet Take 1 tablet (50 mg total) by mouth daily. (Patient taking differently: Take 75 mg by mouth daily.)  . triamcinolone (NASACORT) 55 MCG/ACT AERO nasal inhaler Place 2 sprays into the nose daily.  . [DISCONTINUED] amLODipine (NORVASC) 10 MG tablet Take 10 mg by mouth daily.  . [DISCONTINUED] TRULICITY 1.5 JK/9.3OI SOPN INJECT 1.5MG INTO THE SKIN ONCE WEEKLY.   No facility-administered encounter medications on file as of 08/16/2020.   ALLERGIES: Allergies  Allergen Reactions  . Prednisone     Starts itching   VACCINATION STATUS: Immunization History  Administered Date(s) Administered  . Influenza, High Dose Seasonal PF 11/29/2016, 01/20/2018  . Influenza, Seasonal, Injecte, Preservative Fre 12/27/2012  . Influenza,inj,Quad PF,6+ Mos 01/06/2015  . Influenza-Unspecified 12/23/2013, 12/25/2015, 11/19/2018  . PFIZER(Purple Top)SARS-COV-2 Vaccination 04/03/2019, 04/24/2019, 12/30/2019  . Zoster, Live 04/25/2014    Diabetes He presents for his follow-up diabetic visit. He has type 2 diabetes mellitus. Onset time: He was diagnosed at approximate age of 46 years. His disease course has been worsening. There are no hypoglycemic associated symptoms. Pertinent negatives for hypoglycemia include no confusion, headaches, pallor or seizures. Pertinent negatives for diabetes include no chest pain, no fatigue, no polydipsia, no polyphagia, no polyuria and no weakness. There are no hypoglycemic complications. Symptoms are worsening. Diabetic complications include a CVA and heart disease. Risk factors for coronary artery disease include diabetes mellitus, dyslipidemia, hypertension, male sex, obesity and sedentary  lifestyle. Current diabetic treatment includes oral agent (monotherapy) (Trulicity 1.5 weekly). He is compliant with treatment all of the time. His weight is decreasing steadily. He is following a generally unhealthy diet. When asked about meal planning, he reported none. He has had a previous visit with a dietitian. He participates in exercise intermittently. (He was not asked to monitor blood glucose.  He presents with no meter nor logs.  His point-of-care A1c is 7.7%, progressively  increasing from 6.8% 2 visits ago.   He denies any s/s of hypoglycemia.  ) An ACE inhibitor/angiotensin II receptor blocker is being taken. He sees a podiatrist.Eye exam is current.  Hyperlipidemia This is a chronic problem. The current episode started more than 1 year ago. The problem is controlled. Recent lipid tests were reviewed and are normal.  Exacerbating diseases include chronic renal disease, diabetes and obesity. Pertinent negatives include no chest pain, myalgias or shortness of breath. Current antihyperlipidemic treatment includes statins. The current treatment provides moderate improvement of lipids. Compliance problems include adherence to exercise.  Risk factors for coronary artery disease include diabetes mellitus, dyslipidemia, hypertension, male sex, a sedentary lifestyle, obesity and family history.  Hypertension This is a chronic problem. The current episode started more than 1 year ago. The problem has been gradually improving since onset. The problem is controlled. Pertinent negatives include no chest pain, headaches, neck pain, palpitations or shortness of breath. There are no associated agents to hypertension. Risk factors for coronary artery disease include diabetes mellitus, dyslipidemia, male gender, obesity and sedentary lifestyle. Past treatments include ACE inhibitors, diuretics and calcium channel blockers. The current treatment provides moderate improvement. Compliance problems include exercise and  diet.  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 Identifiable causes of hypertension include chronic renal disease and hyperaldosteronism.    Review of systems  Constitutional: + Lost 4 pounds since last visit, current Body mass index is 32.74 kg/m. , no fatigue, no subjective hyperthermia, no subjective hypothermia    Objective:    BP 112/76   Pulse 72   Ht 5' 10"  (1.778 m)   Wt 228 lb 3.2 oz (103.5 kg)   BMI 32.74 kg/m   Wt Readings from Last 3 Encounters:  08/16/20 228 lb 3.2 oz (103.5 kg)  04/27/20 232 lb (105.2 kg)  03/01/20 230 lb (104.3 kg)    BP Readings from Last 3 Encounters:  08/16/20 112/76  04/27/20 106/72  03/01/20 123/78     Physical Exam- Limited  Constitutional:  Body mass index is 32.74 kg/m. , not in acute distress, normal state of mind   Results for orders placed or performed in visit on 08/16/20  HgB A1c  Result Value Ref Range   Hemoglobin A1C     HbA1c POC (<> result, manual entry)     HbA1c, POC (prediabetic range)     HbA1c, POC (controlled diabetic range) 7.7 (A) 0.0 - 7.0 %   Diabetic Labs (most recent): Lab Results  Component Value Date   HGBA1C 7.7 (A) 08/16/2020   HGBA1C 7.5 (H) 04/24/2020   HGBA1C 6.8 (A) 12/15/2019   CMP Latest Ref Rng & Units 04/24/2020 12/09/2019 02/02/2019  Glucose 65 - 99 mg/dL 123(H) 119(H) 125(H)  BUN 8 - 27 mg/dL 21 23 19   Creatinine 0.76 - 1.27 mg/dL 0.88 0.91 0.85  Sodium 134 - 144 mmol/L 144 141 142  Potassium 3.5 - 5.2 mmol/L 4.4 3.7 4.0  Chloride 96 - 106 mmol/L 101 99 100  CO2 20 - 29 mmol/L 28 26 27   Calcium 8.6 - 10.2 mg/dL 10.0 9.4 9.5  Total Protein 6.0 - 8.5 g/dL 7.1 6.9 6.9  Total Bilirubin 0.0 - 1.2 mg/dL 0.9 1.2 0.7  Alkaline Phos 44 - 121 IU/L 154(H) 129(H) 151(H)  AST 0 - 40 IU/L 27 24 24   ALT 0 - 44 IU/L 26 27 23    Lipid Panel     Component Value Date/Time   CHOL 122 12/09/2019 0908   TRIG 69 12/09/2019 0908   HDL 47 12/09/2019 0908   CHOLHDL 2.2 01/04/2016 0745    VLDL 13 01/04/2016 0745   LDLCALC 61 12/09/2019 0908   LABVLDL 14 12/09/2019 0908     Assessment & Plan:   1) Type 2 diabetes mellitus with vascular disease ([place])  His diabetes is complicated by CVA and  patient remains at a high risk for more acute and chronic complications of diabetes which include CAD, CVA, CKD, retinopathy, and neuropathy.   -He maintains 140 pounds of weight loss status post his Roux-en-Y bypass surgery.   Recent labs reviewed.  - Nutritional counseling repeated at each appointment due to patients tendency to fall back in to old habits.  - he acknowledges that there is a room for improvement in his food and drink choices. - Suggestion is made for him to avoid simple carbohydrates  from his diet including Cakes, Sweet Desserts, Ice Cream, Soda (diet and regular), Sweet Tea, Candies, Chips, Cookies, Store Bought Juices, Alcohol in Excess of  1-2 drinks a day, Artificial Sweeteners,  Coffee Creamer, and "Sugar-free" Products, Lemonade. This will help patient to have more stable blood glucose profile and potentially avoid unintended weight gain.     - I encouraged the patient to switch to  unprocessed or minimally processed complex starch and increased protein intake (animal or plant source), fruits, and vegetables.   - Patient is advised to stick to a routine mealtimes to eat 3 meals  a day and avoid unnecessary snacks ( to snack only to correct hypoglycemia).  - I have approached patient with the following individualized plan to manage diabetes and patient agrees.  - After a successful Roux-en-Y bariatric surgery and lost approximately 140 pounds, he was able to come off of multiple daily injections of insulin therapy.  -Given his rebound in his A1c , he is advised to increase Trulicity to 3 mg subcutaneously weekly.  He is advised to continue metformin 500 mg p.o. twice a day.   -We will monitor blood glucose at least once a day before breakfast.  He is  advised to call clinic for blood glucose readings above 150 mg/dl 3 in a week at fasting.  - Patient specific target  for A1c; LDL, HDL, Triglycerides,  were discussed in detail.  2) BP/HTN:  His blood pressure is tightly controlled to target.  He has a chance to come off of at least one of his blood pressure medications.  He will benefit from chlorthalidone and lisinopril, and can come off of amlodipine.  He would like to discuss this with his hypertension specialist.   3) Lipids/HPL:  His most recent lipid panel from 12/09/19 shows controlled LDL of 61.  He is advised to continue Lipitor 10 mg p.o. nightly.  Side effects and precautions discussed with him.  Will recheck lipid panel prior to next visit.  4)  Weight/Diet:  His Body mass index is 32.74 kg/m. He is status post Roux-en-Y bariatric surgery.  CDE consult in progress, exercise, and carbohydrates information provided.  He is approached to reengage in routine physical activity to prevent further weight gain.  5) Chronic Care/Health Maintenance: -Patient is on ACEI/ARB and Statin medications and encouraged to continue to follow up with Ophthalmology, Podiatrist at least yearly or according to recommendations, and advised to  stay away from smoking. I have recommended yearly flu vaccine and pneumonia vaccination at least every 5 years; moderate intensity exercise for up to 150 minutes weekly; and  sleep for at least 7 hours a day.  6) Vitamin D deficiency -His most recent vitamin D level on 08/06/2018 was 51.  He is advised to continue daily maintenance dose of vitamin D3 for now.  Will recheck vitamin D level prior to next visit.   - I advised patient to maintain close follow up with his PCP and he is  bariatric team for ongoing care needs.   I spent 32 minutes in the care of the patient today including review of labs from [facility], Lipids, Thyroid Function, Hematology (current and previous including abstractions from other facilities);  face-to-face time discussing  his blood glucose readings/logs, discussing hypoglycemia and hyperglycemia episodes and symptoms, medications doses, his options of short and long term treatment based on the latest standards of care / guidelines;  discussion about incorporating lifestyle medicine;  and documenting the encounter.    Please refer to Patient Instructions for Blood Glucose Monitoring and Insulin/Medications Dosing Guide"  in media tab for additional information. Please  also refer to " Patient Self Inventory" in the Media  tab for reviewed elements of pertinent patient history.  Zachary Murillo. participated in the discussions, expressed understanding, and voiced agreement with the above plans.  All questions were answered to his satisfaction. he is encouraged to contact clinic should he have any questions or concerns prior to his return visit.   Follow up plan: -Return in about 3 months (around 11/16/2020) for F/U with Pre-visit Labs, Meter, Logs, A1c here.Rayetta Pigg, FNP-BC [facility] [address] [place], [place] [address] Phone: [phone] Fax: [phone]  08/16/2020, 10:35 AM

## 2020-08-16 NOTE — Patient Instructions (Signed)

## 2020-08-17 ENCOUNTER — Ambulatory Visit: Payer: Medicare PPO | Admitting: "Endocrinology

## 2020-08-24 ENCOUNTER — Ambulatory Visit: Payer: Medicare PPO | Admitting: "Endocrinology

## 2020-09-19 DIAGNOSIS — U071 COVID-19: Secondary | ICD-10-CM | POA: Diagnosis not present

## 2020-10-02 DIAGNOSIS — Z95818 Presence of other cardiac implants and grafts: Secondary | ICD-10-CM | POA: Diagnosis not present

## 2020-10-02 DIAGNOSIS — I639 Cerebral infarction, unspecified: Secondary | ICD-10-CM | POA: Diagnosis not present

## 2020-10-25 ENCOUNTER — Ambulatory Visit: Payer: Medicare PPO | Admitting: "Endocrinology

## 2020-11-02 ENCOUNTER — Other Ambulatory Visit: Payer: Self-pay | Admitting: "Endocrinology

## 2020-11-03 DIAGNOSIS — Z95818 Presence of other cardiac implants and grafts: Secondary | ICD-10-CM | POA: Diagnosis not present

## 2020-11-03 DIAGNOSIS — I639 Cerebral infarction, unspecified: Secondary | ICD-10-CM | POA: Diagnosis not present

## 2020-11-14 DIAGNOSIS — E1159 Type 2 diabetes mellitus with other circulatory complications: Secondary | ICD-10-CM | POA: Diagnosis not present

## 2020-11-14 DIAGNOSIS — E782 Mixed hyperlipidemia: Secondary | ICD-10-CM | POA: Diagnosis not present

## 2020-11-15 ENCOUNTER — Other Ambulatory Visit: Payer: Self-pay | Admitting: Nurse Practitioner

## 2020-11-15 LAB — COMPREHENSIVE METABOLIC PANEL
ALT: 20 IU/L (ref 0–44)
AST: 23 IU/L (ref 0–40)
Albumin/Globulin Ratio: 1.7 (ref 1.2–2.2)
Albumin: 4.1 g/dL (ref 3.8–4.8)
Alkaline Phosphatase: 125 IU/L — ABNORMAL HIGH (ref 44–121)
BUN/Creatinine Ratio: 19 (ref 10–24)
BUN: 15 mg/dL (ref 8–27)
Bilirubin Total: 0.7 mg/dL (ref 0.0–1.2)
CO2: 28 mmol/L (ref 20–29)
Calcium: 9.3 mg/dL (ref 8.6–10.2)
Chloride: 100 mmol/L (ref 96–106)
Creatinine, Ser: 0.81 mg/dL (ref 0.76–1.27)
Globulin, Total: 2.4 g/dL (ref 1.5–4.5)
Glucose: 109 mg/dL — ABNORMAL HIGH (ref 65–99)
Potassium: 3.5 mmol/L (ref 3.5–5.2)
Sodium: 140 mmol/L (ref 134–144)
Total Protein: 6.5 g/dL (ref 6.0–8.5)
eGFR: 95 mL/min/{1.73_m2} (ref >59)

## 2020-11-15 LAB — LIPID PANEL
Chol/HDL Ratio: 2.7 ratio (ref 0.0–5.0)
Cholesterol, Total: 118 mg/dL (ref 100–199)
HDL: 44 mg/dL (ref >39)
LDL Chol Calc (NIH): 58 mg/dL (ref 0–99)
Triglycerides: 83 mg/dL (ref 0–149)
VLDL Cholesterol Cal: 16 mg/dL (ref 5–40)

## 2020-11-15 LAB — T4, FREE: Free T4: 1.18 ng/dL (ref 0.82–1.77)

## 2020-11-15 LAB — TSH: TSH: 5.29 u[IU]/mL — ABNORMAL HIGH (ref 0.450–4.500)

## 2020-11-15 LAB — VITAMIN B12: Vitamin B-12: 716 pg/mL (ref 232–1245)

## 2020-11-15 LAB — VITAMIN D 25 HYDROXY (VIT D DEFICIENCY, FRACTURES): Vit D, 25-Hydroxy: 54.5 ng/mL (ref 30.0–100.0)

## 2020-11-16 ENCOUNTER — Encounter: Payer: Self-pay | Admitting: "Endocrinology

## 2020-11-16 ENCOUNTER — Ambulatory Visit: Payer: Medicare PPO | Admitting: "Endocrinology

## 2020-11-16 ENCOUNTER — Other Ambulatory Visit: Payer: Self-pay

## 2020-11-16 VITALS — BP 120/78 | HR 68 | Ht 70.0 in | Wt 226.8 lb

## 2020-11-16 DIAGNOSIS — E559 Vitamin D deficiency, unspecified: Secondary | ICD-10-CM | POA: Diagnosis not present

## 2020-11-16 DIAGNOSIS — E782 Mixed hyperlipidemia: Secondary | ICD-10-CM | POA: Diagnosis not present

## 2020-11-16 DIAGNOSIS — E1159 Type 2 diabetes mellitus with other circulatory complications: Secondary | ICD-10-CM

## 2020-11-16 DIAGNOSIS — I10 Essential (primary) hypertension: Secondary | ICD-10-CM

## 2020-11-16 LAB — POCT GLYCOSYLATED HEMOGLOBIN (HGB A1C): HbA1c, POC (controlled diabetic range): 6.5 % (ref 0.0–7.0)

## 2020-11-16 MED ORDER — TRULICITY 3 MG/0.5ML ~~LOC~~ SOAJ: SUBCUTANEOUS | 4 refills | Status: DC

## 2020-11-16 NOTE — Progress Notes (Signed)
11/16/2020  Endocrinology follow-up note   Subjective:    Patient ID: Carlyon Shadow., male    DOB: 1950/09/12, PCP Johna Roles, PA   Past Medical History:  Diagnosis Date   Acute ischemic stroke ([place]) 02/22/2013   CHF (congestive heart failure) ([place])    CVA (cerebral infarction) 01/22/2013   Diabetes mellitus ([place])    Difficulty in walking(719.7) 01/25/2013   History of Roux-en-Y gastric bypass    Hypertension    IMPINGEMENT SYNDROME 01/16/2009   Qualifier: Diagnosis of  By: Aline Brochure MD, [place] 01/16/2009   Qualifier: Diagnosis of  By: Aline Brochure MD, [place]     Knee pain, right 09/05/2011   KNEE, ARTHRITIS, DEGEN./OSTEO 01/30/2009   Qualifier: Diagnosis of  By: Aline Brochure MD, [place]     Lack of coordination 01/22/2013   Non-ischemic cardiomyopathy ([place])    Osteoarthritis of knee    Other obesity due to excess calories 01/30/2015   Overweight(278.02)    Pulmonary hypertension ([place])    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 12/2014, Dr. Flavia Shipper   S/P lumbar fusion 07/02/2013   SHOULDER PAIN 01/16/2009   Qualifier: Diagnosis of  By: Aline Brochure MD, [place]     Shoulder pain, right    Sleep apnea    Sleep apnea    Spinal stenosis    Stroke [facility])    TENDINITIS, LEFT KNEE 01/16/2009   Qualifier: Diagnosis of  By: Aline Brochure MD, Arlana Hove D deficiency    Past Surgical History:  Procedure Laterality Date   BACK SURGERY     CARDIAC CATHETERIZATION     KNEE ARTHROSCOPY     NASAL SEPTUM SURGERY     Social History   Socioeconomic History   Marital status: Married    Spouse name: Otila Kluver    Number of children: 1   Years of education: Masters   Highest education level: Not on file  Occupational History   Occupation: Retired-Drivers Ed   Tobacco Use   Smoking status: Never   Smokeless tobacco: Never  Vaping Use   Vaping Use: Never used  Substance and Sexual Activity   Alcohol use: Yes     Comment: occ   Drug use: No   Sexual activity: Yes    Birth control/protection: Surgical  Other Topics Concern   Not on file  Social History Narrative   Lives with wife-42 years   One daughter who lives in [place] grandchildren, one expecting      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, Warehouse manager racing, fishing-Cubs and Curator      Diet: eats all food groups   Caffeine: 2 cups of coffee daily, tea every other day   Water: 2-3 cups daily      Wears seat belt   Does not use phone while driving    Oceanographer             Social Determinants of Health   Financial Resource Strain: Not on file  Food Insecurity: Not on file  Transportation Needs: Not on file  Physical Activity: Not on file  Stress: Not on file  Social Connections: Not on file   Outpatient Encounter Medications as of 11/16/2020  Medication Sig   amLODipine (NORVASC) 5 MG tablet Take 1 tablet by mouth daily. (Patient not taking: Reported on 11/16/2020)   Ascorbic Acid (VITAMIN C) 1000 MG tablet Take 1,000 mg by  mouth daily.   atorvastatin (LIPITOR) 10 MG tablet TAKE (1) TABLET BY MOUTH AT BEDTIME.   Blood Glucose Monitoring Suppl (ACCU-CHEK GUIDE ME) w/Device KIT 1 Piece by Does not apply route as directed.   Calcium Citrate-Vitamin D (CALCIUM + D PO) Take by mouth 2 (two) times daily.   cetirizine (ZYRTEC) 10 MG tablet Take 10 mg by mouth daily as needed for allergies.    chlorthalidone (HYGROTON) 25 MG tablet Take 12.5 mg by mouth daily.    Cholecalciferol (VITAMIN D) 2000 units CAPS Take by mouth.   clopidogrel (PLAVIX) 75 MG tablet Take 1 tablet (75 mg total) by mouth daily.   docusate sodium (COLACE) 100 MG capsule Take 100 mg by mouth 2 (two) times daily.   Dulaglutide (TRULICITY) 3 CX/4.4YJ SOPN INJECT 3MG AS DIRECTED ONCE A WEEK.   glucose blood (ACCU-CHEK GUIDE) test strip Test glucose 1 time a day   lisinopril (PRINIVIL,ZESTRIL) 40 MG tablet Take 40 mg by mouth every morning.   Multiple  Vitamins-Minerals (CENTRUM PO) Take 1 tablet by mouth every morning.   sertraline (ZOLOFT) 50 MG tablet Take 1 tablet (50 mg total) by mouth daily. (Patient taking differently: Take 75 mg by mouth daily.)   triamcinolone (NASACORT) 55 MCG/ACT AERO nasal inhaler Place 2 sprays into the nose daily.   [DISCONTINUED] metFORMIN (GLUCOPHAGE) 500 MG tablet Take 1 tablet (500 mg total) by mouth 2 (two) times daily with a meal. TAKE 1 TABLET BY MOUTH ONCE AFTER BREAKFAST   [DISCONTINUED] TRULICITY 3 EH/6.3JS SOPN INJECT 3MG AS DIRECTED ONCE A WEEK.   No facility-administered encounter medications on file as of 11/16/2020.   ALLERGIES: Allergies  Allergen Reactions   Prednisone     Starts itching   VACCINATION STATUS: Immunization History  Administered Date(s) Administered   Influenza, High Dose Seasonal PF 11/29/2016, 01/20/2018   Influenza, Seasonal, Injecte, Preservative Fre 12/27/2012   Influenza,inj,Quad PF,6+ Mos 01/06/2015   Influenza-Unspecified 12/23/2013, 12/25/2015, 11/19/2018   PFIZER(Purple Top)SARS-COV-2 Vaccination 04/03/2019, 04/24/2019, 12/30/2019   Zoster, Live 04/25/2014    Diabetes He presents for his follow-up diabetic visit. He has type 2 diabetes mellitus. Onset time: He was diagnosed at approximate age of 47 years. His disease course has been improving. There are no hypoglycemic associated symptoms. Pertinent negatives for hypoglycemia include no confusion, headaches, pallor or seizures. Pertinent negatives for diabetes include no chest pain, no fatigue, no polydipsia, no polyphagia, no polyuria and no weakness. There are no hypoglycemic complications. Symptoms are improving. Diabetic complications include a CVA and heart disease. Risk factors for coronary artery disease include diabetes mellitus, dyslipidemia, hypertension, male sex, obesity and sedentary lifestyle. Current diabetic treatment includes oral agent (monotherapy) (Trulicity 1.5 weekly). He is compliant with  treatment all of the time. His weight is decreasing steadily. He is following a generally unhealthy diet. When asked about meal planning, he reported none. He has had a previous visit with a dietitian. He participates in exercise intermittently. His home blood glucose trend is decreasing steadily. (He presents with improved glycemic profile and point-of-care A1c of 6.5% improving from 7.7% during his last visit.  His average blood glucose is 125 to 150 mg per DL.  He denies any hypoglycemia.   ) An ACE inhibitor/angiotensin II receptor blocker is being taken. He sees a podiatrist.Eye exam is current.  Hyperlipidemia This is a chronic problem. The current episode started more than 1 year ago. The problem is controlled. Recent lipid tests were reviewed and are normal. Exacerbating diseases include chronic  renal disease, diabetes and obesity. Pertinent negatives include no chest pain, myalgias or shortness of breath. Current antihyperlipidemic treatment includes statins. The current treatment provides moderate improvement of lipids. Compliance problems include adherence to exercise.  Risk factors for coronary artery disease include diabetes mellitus, dyslipidemia, hypertension, male sex, a sedentary lifestyle, obesity and family history.  Hypertension This is a chronic problem. The current episode started more than 1 year ago. The problem has been gradually improving since onset. The problem is controlled. Pertinent negatives include no chest pain, headaches, neck pain, palpitations or shortness of breath. There are no associated agents to hypertension. Risk factors for coronary artery disease include diabetes mellitus, dyslipidemia, male gender, obesity and sedentary lifestyle. Past treatments include ACE inhibitors, diuretics and calcium channel blockers. The current treatment provides moderate improvement. Compliance problems include exercise and diet.  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  Identifiable causes of hypertension include chronic renal disease and hyperaldosteronism.   Review of systems  Constitutional: + Lost 4 pounds since last visit, current Body mass index is 32.54 kg/m. , no fatigue, no subjective hyperthermia, no subjective hypothermia    Objective:    BP 120/78   Pulse 68   Ht 5' 10"  (1.778 m)   Wt 226 lb 12.8 oz (102.9 kg)   BMI 32.54 kg/m   Wt Readings from Last 3 Encounters:  11/16/20 226 lb 12.8 oz (102.9 kg)  08/16/20 228 lb 3.2 oz (103.5 kg)  04/27/20 232 lb (105.2 kg)    BP Readings from Last 3 Encounters:  11/16/20 120/78  08/16/20 112/76  04/27/20 106/72     Physical Exam- Limited  Constitutional:  Body mass index is 32.54 kg/m. , not in acute distress, normal state of mind   Results for orders placed or performed in visit on 11/16/20  HgB A1c  Result Value Ref Range   Hemoglobin A1C     HbA1c POC (<> result, manual entry)     HbA1c, POC (prediabetic range)     HbA1c, POC (controlled diabetic range) 6.5 0.0 - 7.0 %   Diabetic Labs (most recent): Lab Results  Component Value Date   HGBA1C 6.5 11/16/2020   HGBA1C 7.7 (A) 08/16/2020   HGBA1C 7.5 (H) 04/24/2020   CMP Latest Ref Rng & Units 11/14/2020 04/24/2020 12/09/2019  Glucose 65 - 99 mg/dL 109(H) 123(H) 119(H)  BUN 8 - 27 mg/dL 15 21 23   Creatinine 0.76 - 1.27 mg/dL 0.81 0.88 0.91  Sodium 134 - 144 mmol/L 140 144 141  Potassium 3.5 - 5.2 mmol/L 3.5 4.4 3.7  Chloride 96 - 106 mmol/L 100 101 99  CO2 20 - 29 mmol/L 28 28 26   Calcium 8.6 - 10.2 mg/dL 9.3 10.0 9.4  Total Protein 6.0 - 8.5 g/dL 6.5 7.1 6.9  Total Bilirubin 0.0 - 1.2 mg/dL 0.7 0.9 1.2  Alkaline Phos 44 - 121 IU/L 125(H) 154(H) 129(H)  AST 0 - 40 IU/L 23 27 24   ALT 0 - 44 IU/L 20 26 27    Lipid Panel     Component Value Date/Time   CHOL 118 11/14/2020 0837   TRIG 83 11/14/2020 0837   HDL 44 11/14/2020 0837   CHOLHDL 2.7 11/14/2020 0837   CHOLHDL 2.2 01/04/2016 0745   VLDL 13 01/04/2016 0745    LDLCALC 58 11/14/2020 0837   LABVLDL 16 11/14/2020 0837     Assessment & Plan:   1) Type 2 diabetes mellitus with vascular disease ([place])  His diabetes is complicated by CVA  and patient remains at a high risk for more acute and chronic complications of diabetes which include CAD, CVA, CKD, retinopathy, and neuropathy.   -He maintains 140 pounds of weight loss status post his Roux-en-Y bypass surgery.  He presents with improved glycemic profile and point-of-care A1c of 6.5% improving from 7.7% during his last visit.  His average blood glucose is 125 to 150 mg per DL.  He denies any hypoglycemia.    Recent labs reviewed.  - Nutritional counseling repeated at each appointment due to patients tendency to fall back in to old habits.  - he acknowledges that there is a room for improvement in his food and drink choices. - Suggestion is made for him to avoid simple carbohydrates  from his diet including Cakes, Sweet Desserts, Ice Cream, Soda (diet and regular), Sweet Tea, Candies, Chips, Cookies, Store Bought Juices, Alcohol in Excess of  1-2 drinks a day, Artificial Sweeteners,  Coffee Creamer, and "Sugar-free" Products, Lemonade. This will help patient to have more stable blood glucose profile and potentially avoid unintended weight gain.    - I encouraged the patient to switch to  unprocessed or minimally processed complex starch and increased protein intake (animal or plant source), fruits, and vegetables.   - Patient is advised to stick to a routine mealtimes to eat 3 meals  a day and avoid unnecessary snacks ( to snack only to correct hypoglycemia).  - I have approached patient with the following individualized plan to manage diabetes and patient agrees.  - After a successful Roux-en-Y bariatric surgery and lost approximately 140 pounds, he was able to come off of multiple daily injections of insulin therapy.  -Given his target A1c of 6.5%, he is advised to discontinue metformin for now.   He will continue only on Trulicity 3 mg subcutaneously weekly.   - He is advised to call clinic for blood glucose readings above 150 mg/dl 3 in a week at fasting.  - Patient specific target  for A1c; LDL, HDL, Triglycerides,  were discussed in detail.  2) BP/HTN:  -His blood pressure is controlled to target.  In the interval, his amlodipine was discontinued.  Currently on chlorthalidone and lisinopril.    3) Lipids/HPL:  His most recent lipid panel showed improved lipid profile with LDL at 58.  He is advised to continue Lipitor 10 mg p.o. nightly.  Side effects and precautions discussed with him.  Will recheck lipid panel prior to next visit.  4)  Weight/Diet:  His Body mass index is 32.54 kg/m. He is status post Roux-en-Y bariatric surgery.  CDE consult in progress, exercise, and carbohydrates information provided.  He is approached to reengage in routine physical activity to prevent further weight gain.  5) Chronic Care/Health Maintenance: -Patient is on ACEI/ARB and Statin medications and encouraged to continue to follow up with Ophthalmology, Podiatrist at least yearly or according to recommendations, and advised to  stay away from smoking. I have recommended yearly flu vaccine and pneumonia vaccination at least every 5 years; moderate intensity exercise for up to 150 minutes weekly; and  sleep for at least 7 hours a day.  6) Vitamin D deficiency -His most recent vitamin D level on 08/06/2018 was 51.  He is advised to continue daily maintenance dose of vitamin D3 for now.  Will recheck vitamin D level prior to next visit.   - I advised patient to maintain close follow up with his PCP and he is bariatric team for ongoing care needs.  I spent 31 minutes in the care of the patient today including review of labs from [place], Lipids, Thyroid Function, Hematology (current and previous including abstractions from other facilities); face-to-face time discussing  his blood glucose readings/logs,  discussing hypoglycemia and hyperglycemia episodes and symptoms, medications doses, his options of short and long term treatment based on the latest standards of care / guidelines;  discussion about incorporating lifestyle medicine;  and documenting the encounter.    Please refer to Patient Instructions for Blood Glucose Monitoring and Insulin/Medications Dosing Guide"  in media tab for additional information. Please  also refer to " Patient Self Inventory" in the Media  tab for reviewed elements of pertinent patient history.  Carlyon Shadow. participated in the discussions, expressed understanding, and voiced agreement with the above plans.  All questions were answered to his satisfaction. he is encouraged to contact clinic should he have any questions or concerns prior to his return visit.   Follow up plan: -Return in about 6 months (around 05/16/2021) for F/U with Pre-visit Labs, A1c -NV.    Rayetta Pigg, [facility] [address] [place], [place] [address] Phone: [phone] Fax: [phone]  11/16/2020, 9:38 AM

## 2020-11-16 NOTE — Patient Instructions (Signed)

## 2020-11-22 DIAGNOSIS — H318 Other specified disorders of choroid: Secondary | ICD-10-CM | POA: Diagnosis not present

## 2020-11-22 DIAGNOSIS — H35362 Drusen (degenerative) of macula, left eye: Secondary | ICD-10-CM | POA: Diagnosis not present

## 2020-11-22 DIAGNOSIS — H43811 Vitreous degeneration, right eye: Secondary | ICD-10-CM | POA: Diagnosis not present

## 2020-11-22 DIAGNOSIS — Z961 Presence of intraocular lens: Secondary | ICD-10-CM | POA: Diagnosis not present

## 2020-11-23 ENCOUNTER — Encounter (INDEPENDENT_AMBULATORY_CARE_PROVIDER_SITE_OTHER): Payer: Self-pay

## 2020-12-04 DIAGNOSIS — I639 Cerebral infarction, unspecified: Secondary | ICD-10-CM | POA: Diagnosis not present

## 2020-12-04 DIAGNOSIS — Z95818 Presence of other cardiac implants and grafts: Secondary | ICD-10-CM | POA: Diagnosis not present

## 2020-12-07 ENCOUNTER — Telehealth: Payer: Self-pay | Admitting: Orthopedic Surgery

## 2020-12-07 NOTE — Telephone Encounter (Signed)
Patient and wife called to inquire about which orthopaedic specialist Dr Aline Brochure discussed with him 'several years ago' as said that Dr Aline Brochure mentioned for shoulder replacement surgery. States they may now be ready to look into getting scheduled for appointment. I mentioned our provider here, Dr Amedeo Kinsman. Patient has been hearing name Dr Veverly Fells at [facility]. Aware to call if records needed.

## 2020-12-21 DIAGNOSIS — Z23 Encounter for immunization: Secondary | ICD-10-CM | POA: Diagnosis not present

## 2020-12-21 DIAGNOSIS — R972 Elevated prostate specific antigen [PSA]: Secondary | ICD-10-CM | POA: Diagnosis not present

## 2020-12-21 DIAGNOSIS — E1169 Type 2 diabetes mellitus with other specified complication: Secondary | ICD-10-CM | POA: Diagnosis not present

## 2020-12-21 DIAGNOSIS — G4733 Obstructive sleep apnea (adult) (pediatric): Secondary | ICD-10-CM | POA: Diagnosis not present

## 2020-12-21 DIAGNOSIS — I151 Hypertension secondary to other renal disorders: Secondary | ICD-10-CM | POA: Diagnosis not present

## 2020-12-21 DIAGNOSIS — Z125 Encounter for screening for malignant neoplasm of prostate: Secondary | ICD-10-CM | POA: Diagnosis not present

## 2020-12-21 DIAGNOSIS — Z0000 Encounter for general adult medical examination without abnormal findings: Secondary | ICD-10-CM | POA: Diagnosis not present

## 2020-12-21 DIAGNOSIS — Z9884 Bariatric surgery status: Secondary | ICD-10-CM | POA: Diagnosis not present

## 2020-12-21 DIAGNOSIS — Z8673 Personal history of transient ischemic attack (TIA), and cerebral infarction without residual deficits: Secondary | ICD-10-CM | POA: Diagnosis not present

## 2020-12-21 DIAGNOSIS — F419 Anxiety disorder, unspecified: Secondary | ICD-10-CM | POA: Diagnosis not present

## 2021-01-05 DIAGNOSIS — Z95818 Presence of other cardiac implants and grafts: Secondary | ICD-10-CM | POA: Diagnosis not present

## 2021-01-05 DIAGNOSIS — I639 Cerebral infarction, unspecified: Secondary | ICD-10-CM | POA: Diagnosis not present

## 2021-01-26 DIAGNOSIS — Z23 Encounter for immunization: Secondary | ICD-10-CM | POA: Diagnosis not present

## 2021-02-08 DIAGNOSIS — I639 Cerebral infarction, unspecified: Secondary | ICD-10-CM | POA: Diagnosis not present

## 2021-02-08 DIAGNOSIS — Z95818 Presence of other cardiac implants and grafts: Secondary | ICD-10-CM | POA: Diagnosis not present

## 2021-02-26 ENCOUNTER — Other Ambulatory Visit: Payer: Self-pay | Admitting: "Endocrinology

## 2021-02-28 DIAGNOSIS — M25511 Pain in right shoulder: Secondary | ICD-10-CM | POA: Diagnosis not present

## 2021-02-28 DIAGNOSIS — M19011 Primary osteoarthritis, right shoulder: Secondary | ICD-10-CM | POA: Diagnosis not present

## 2021-03-26 DIAGNOSIS — M25511 Pain in right shoulder: Secondary | ICD-10-CM | POA: Diagnosis not present

## 2021-04-02 DIAGNOSIS — M25511 Pain in right shoulder: Secondary | ICD-10-CM | POA: Diagnosis not present

## 2021-04-16 ENCOUNTER — Ambulatory Visit: Payer: Medicare PPO | Admitting: Orthopedic Surgery

## 2021-04-16 ENCOUNTER — Other Ambulatory Visit: Payer: Self-pay

## 2021-04-16 DIAGNOSIS — M1711 Unilateral primary osteoarthritis, right knee: Secondary | ICD-10-CM

## 2021-04-16 NOTE — Progress Notes (Addendum)
Back in 2021 around December patient presented with right knee issue  Right knee tenderness over the medial joint line he does come to full extension flexes the knee up to 125 degrees with no instability no effusion detected  71 year old male with chronic right knee pain from osteoarthritis comes in for a checkup complaining of intermittent sharp pain medial side of his right knee that comes and goes last felt around Thanksgiving.  His brace has worn out   He s ambulatory w/o assist device  He s in a right medial OA unloader brace   Encounter Diagnosis  Name Primary?   Unilateral primary osteoarthritis, right knee Yes

## 2021-04-23 ENCOUNTER — Other Ambulatory Visit: Payer: Self-pay | Admitting: "Endocrinology

## 2021-04-26 DIAGNOSIS — E1159 Type 2 diabetes mellitus with other circulatory complications: Secondary | ICD-10-CM | POA: Diagnosis not present

## 2021-04-27 LAB — COMPREHENSIVE METABOLIC PANEL
ALT: 29 IU/L (ref 0–44)
AST: 23 IU/L (ref 0–40)
Albumin/Globulin Ratio: 1.9 (ref 1.2–2.2)
Albumin: 4.2 g/dL (ref 3.7–4.7)
Alkaline Phosphatase: 155 IU/L — ABNORMAL HIGH (ref 44–121)
BUN/Creatinine Ratio: 21 (ref 10–24)
BUN: 18 mg/dL (ref 8–27)
Bilirubin Total: 0.8 mg/dL (ref 0.0–1.2)
CO2: 27 mmol/L (ref 20–29)
Calcium: 9.4 mg/dL (ref 8.6–10.2)
Chloride: 102 mmol/L (ref 96–106)
Creatinine, Ser: 0.85 mg/dL (ref 0.76–1.27)
Globulin, Total: 2.2 g/dL (ref 1.5–4.5)
Glucose: 148 mg/dL — ABNORMAL HIGH (ref 70–99)
Potassium: 3.8 mmol/L (ref 3.5–5.2)
Sodium: 142 mmol/L (ref 134–144)
Total Protein: 6.4 g/dL (ref 6.0–8.5)
eGFR: 93 mL/min/{1.73_m2} (ref >59)

## 2021-05-01 ENCOUNTER — Encounter (INDEPENDENT_AMBULATORY_CARE_PROVIDER_SITE_OTHER): Payer: Medicare PPO | Admitting: Ophthalmology

## 2021-05-07 ENCOUNTER — Encounter (INDEPENDENT_AMBULATORY_CARE_PROVIDER_SITE_OTHER): Payer: Medicare PPO | Admitting: Ophthalmology

## 2021-05-07 DIAGNOSIS — J302 Other seasonal allergic rhinitis: Secondary | ICD-10-CM | POA: Diagnosis not present

## 2021-05-07 DIAGNOSIS — H6123 Impacted cerumen, bilateral: Secondary | ICD-10-CM | POA: Diagnosis not present

## 2021-05-08 ENCOUNTER — Telehealth: Payer: Self-pay | Admitting: "Endocrinology

## 2021-05-08 ENCOUNTER — Ambulatory Visit: Payer: Medicare PPO | Admitting: "Endocrinology

## 2021-05-08 ENCOUNTER — Encounter: Payer: Self-pay | Admitting: "Endocrinology

## 2021-05-08 VITALS — BP 113/74 | HR 67 | Ht 70.0 in | Wt 222.8 lb

## 2021-05-08 DIAGNOSIS — E1159 Type 2 diabetes mellitus with other circulatory complications: Secondary | ICD-10-CM | POA: Diagnosis not present

## 2021-05-08 DIAGNOSIS — E782 Mixed hyperlipidemia: Secondary | ICD-10-CM | POA: Diagnosis not present

## 2021-05-08 DIAGNOSIS — I10 Essential (primary) hypertension: Secondary | ICD-10-CM

## 2021-05-08 LAB — POCT GLYCOSYLATED HEMOGLOBIN (HGB A1C): HbA1c, POC (controlled diabetic range): 8 % — AB (ref 0.0–7.0)

## 2021-05-08 NOTE — Progress Notes (Signed)
05/08/2021  Endocrinology follow-up note   Subjective:    Patient ID: Zachary Murillo., male    DOB: Nov 24, 1950, PCP Johna Roles, PA   Past Medical History:  Diagnosis Date   Acute ischemic stroke ([place]) 02/22/2013   CHF (congestive heart failure) ([place])    CVA (cerebral infarction) 01/22/2013   Diabetes mellitus ([place])    Difficulty in walking(719.7) 01/25/2013   History of Roux-en-Y gastric bypass    Hypertension    IMPINGEMENT SYNDROME 01/16/2009   Qualifier: Diagnosis of  By: Aline Brochure MD, [place] 01/16/2009   Qualifier: Diagnosis of  By: Aline Brochure MD, [place]     Knee pain, right 09/05/2011   KNEE, ARTHRITIS, DEGEN./OSTEO 01/30/2009   Qualifier: Diagnosis of  By: Aline Brochure MD, [place]     Lack of coordination 01/22/2013   Non-ischemic cardiomyopathy ([place])    Osteoarthritis of knee    Other obesity due to excess calories 01/30/2015   Overweight(278.02)    Pulmonary hypertension ([place])    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 12/2014, Dr. Flavia Shipper   S/P lumbar fusion 07/02/2013   SHOULDER PAIN 01/16/2009   Qualifier: Diagnosis of  By: Aline Brochure MD, [place]     Shoulder pain, right    Sleep apnea    Sleep apnea    Spinal stenosis    Stroke [facility])    TENDINITIS, LEFT KNEE 01/16/2009   Qualifier: Diagnosis of  By: Aline Brochure MD, Arlana Hove D deficiency    Past Surgical History:  Procedure Laterality Date   BACK SURGERY     CARDIAC CATHETERIZATION     KNEE ARTHROSCOPY     NASAL SEPTUM SURGERY     Social History   Socioeconomic History   Marital status: Married    Spouse name: Otila Kluver    Number of children: 1   Years of education: Masters   Highest education level: Not on file  Occupational History   Occupation: Retired-Drivers Ed   Tobacco Use   Smoking status: Never   Smokeless tobacco: Never  Vaping Use   Vaping Use: Never used  Substance and Sexual Activity   Alcohol use: Yes     Comment: occ   Drug use: No   Sexual activity: Yes    Birth control/protection: Surgical  Other Topics Concern   Not on file  Social History Narrative   Lives with wife-42 years   One daughter who lives in [place] grandchildren, one expecting      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, Warehouse manager racing, fishing-Cubs and Curator      Diet: eats all food groups   Caffeine: 2 cups of coffee daily, tea every other day   Water: 2-3 cups daily      Wears seat belt   Does not use phone while driving    Oceanographer             Social Determinants of Health   Financial Resource Strain: Not on file  Food Insecurity: Not on file  Transportation Needs: Not on file  Physical Activity: Not on file  Stress: Not on file  Social Connections: Not on file   Outpatient Encounter Medications as of 05/08/2021  Medication Sig   Ascorbic Acid (VITAMIN C) 1000 MG tablet Take 1,000 mg by mouth daily.   atorvastatin (LIPITOR) 10 MG tablet TAKE (1) TABLET BY MOUTH AT BEDTIME.   Blood  Glucose Monitoring Suppl (ACCU-CHEK GUIDE ME) w/Device KIT 1 Piece by Does not apply route as directed.   Calcium Citrate-Vitamin D (CALCIUM + D PO) Take by mouth 2 (two) times daily.   cetirizine (ZYRTEC) 10 MG tablet Take 10 mg by mouth daily as needed for allergies.    Cholecalciferol (VITAMIN D) 2000 units CAPS Take by mouth.   clopidogrel (PLAVIX) 75 MG tablet Take 1 tablet (75 mg total) by mouth daily.   docusate sodium (COLACE) 100 MG capsule Take 100 mg by mouth 2 (two) times daily.   glucose blood (ACCU-CHEK GUIDE) test strip Test glucose 1 time a day   lisinopril (PRINIVIL,ZESTRIL) 40 MG tablet Take 40 mg by mouth every morning.   Multiple Vitamins-Minerals (CENTRUM PO) Take 1 tablet by mouth every morning.   sertraline (ZOLOFT) 100 MG tablet Take 150 mg by mouth daily.   sertraline (ZOLOFT) 50 MG tablet Take 1 tablet (50 mg total) by mouth daily.   triamcinolone (NASACORT) 55 MCG/ACT  AERO nasal inhaler Place 2 sprays into the nose daily.   TRULICITY 3 JO/8.4ZY SOPN INJECT 3MG AS DIRECTED ONCE A WEEK.   amLODipine (NORVASC) 5 MG tablet Take 1 tablet by mouth daily. (Patient not taking: Reported on 11/16/2020)   chlorthalidone (HYGROTON) 25 MG tablet Take 12.5 mg by mouth daily.    No facility-administered encounter medications on file as of 05/08/2021.   ALLERGIES: Allergies  Allergen Reactions   Prednisone     Starts itching   VACCINATION STATUS: Immunization History  Administered Date(s) Administered   Influenza, High Dose Seasonal PF 11/29/2016, 01/20/2018   Influenza, Seasonal, Injecte, Preservative Fre 12/27/2012   Influenza,inj,Quad PF,6+ Mos 01/06/2015   Influenza-Unspecified 12/23/2013, 12/25/2015, 11/19/2018   PFIZER(Purple Top)SARS-COV-2 Vaccination 04/03/2019, 04/24/2019, 12/30/2019   Zoster, Live 04/25/2014    Diabetes He presents for his follow-up diabetic visit. He has type 2 diabetes mellitus. Onset time: He was diagnosed at approximate age of 68 years. His disease course has been worsening. There are no hypoglycemic associated symptoms. Pertinent negatives for hypoglycemia include no confusion, headaches, pallor or seizures. Pertinent negatives for diabetes include no chest pain, no fatigue, no polydipsia, no polyphagia, no polyuria and no weakness. There are no hypoglycemic complications. Symptoms are worsening. Diabetic complications include a CVA and heart disease. Risk factors for coronary artery disease include diabetes mellitus, dyslipidemia, hypertension, male sex, obesity and sedentary lifestyle. Current diabetic treatment includes oral agent (monotherapy) (Trulicity 1.5 weekly). He is compliant with treatment all of the time. His weight is stable. He has had a previous visit with a dietitian. He participates in exercise intermittently. His home blood glucose trend is increasing steadily. (He did not bring any meter nor logs.  However, his  point-of-care A1c is 8% increasing from 6.5% during his last visit.   ) An ACE inhibitor/angiotensin II receptor blocker is being taken. He sees a podiatrist.Eye exam is current.  Hyperlipidemia This is a chronic problem. The current episode started more than 1 year ago. The problem is controlled. Recent lipid tests were reviewed and are normal. Exacerbating diseases include chronic renal disease, diabetes and obesity. Pertinent negatives include no chest pain, myalgias or shortness of breath. Current antihyperlipidemic treatment includes statins. The current treatment provides moderate improvement of lipids. Compliance problems include adherence to exercise.  Risk factors for coronary artery disease include diabetes mellitus, dyslipidemia, hypertension, male sex, a sedentary lifestyle, obesity and family history.  Hypertension This is a chronic problem. The current episode started more than 1 year  ago. The problem has been gradually improving since onset. The problem is controlled. Pertinent negatives include no chest pain, headaches, neck pain, palpitations or shortness of breath. There are no associated agents to hypertension. Risk factors for coronary artery disease include diabetes mellitus, dyslipidemia, male gender, obesity and sedentary lifestyle. Past treatments include ACE inhibitors, diuretics and calcium channel blockers. The current treatment provides moderate improvement. Compliance problems include exercise and diet.  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 Identifiable causes of hypertension include chronic renal disease and hyperaldosteronism.   Review of systems  Constitutional: + Lost 4 pounds since last visit, current Body mass index is 31.97 kg/m. , no fatigue, no subjective hyperthermia, no subjective hypothermia    Objective:    BP 113/74    Pulse 67    Ht _0  (1.778 m)    Wt 222 lb 12.8 oz (101.1 kg)    SpO2 99%    BMI 31.97 kg/m   Wt Readings from Last 3  Encounters:  05/08/21 222 lb 12.8 oz (101.1 kg)  11/16/20 226 lb 12.8 oz (102.9 kg)  08/16/20 228 lb 3.2 oz (103.5 kg)    BP Readings from Last 3 Encounters:  05/08/21 113/74  11/16/20 120/78  08/16/20 112/76     Physical Exam- Limited  Constitutional:  Body mass index is 31.97 kg/m. , not in acute distress, normal state of mind   Results for orders placed or performed in visit on 05/08/21  POCT glycosylated hemoglobin (Hb A1C)  Result Value Ref Range   Hemoglobin A1C     HbA1c POC (<> result, manual entry)     HbA1c, POC (prediabetic range)     HbA1c, POC (controlled diabetic range) 8.0 (A) 0.0 - 7.0 %   Diabetic Labs (most recent): Lab Results  Component Value Date   HGBA1C 8.0 (A) 05/08/2021   HGBA1C 6.5 11/16/2020   HGBA1C 7.7 (A) 08/16/2020   CMP Latest Ref Rng & Units 04/26/2021 11/14/2020 04/24/2020  Glucose 70 - 99 mg/dL 148(H) 109(H) 123(H)  BUN 8 - 27 mg/dL _1 Creatinine 0.76 - 1.27 mg/dL 0.85 0.81 0.88  Sodium 134 - 144 mmol/L 142 140 144  Potassium 3.5 - 5.2 mmol/L 3.8 3.5 4.4  Chloride 96 - 106 mmol/L 102 100 101  CO2 20 - 29 mmol/L _2 Calcium 8.6 - 10.2 mg/dL 9.4 9.3 10.0  Total Protein 6.0 - 8.5 g/dL 6.4 6.5 7.1  Total Bilirubin 0.0 - 1.2 mg/dL 0.8 0.7 0.9  Alkaline Phos 44 - 121 IU/L 155(H) 125(H) 154(H)  AST 0 - 40 IU/L _3 ALT 0 - 44 IU/L _4 Lipid Panel     Component Value Date/Time   CHOL 118 11/14/2020 0837   TRIG 83 11/14/2020 0837   HDL 44 11/14/2020 0837   CHOLHDL 2.7 11/14/2020 0837   CHOLHDL 2.2 01/04/2016 0745   VLDL 13 01/04/2016 0745   LDLCALC 58 11/14/2020 0837   LABVLDL 16 11/14/2020 0837     Assessment & Plan:   1) Type 2 diabetes mellitus with vascular disease ([place])  His diabetes is complicated by CVA and patient remains at a high risk for more acute and chronic complications of diabetes which include CAD, CVA, CKD, retinopathy, and neuropathy.  He did not bring any meter nor logs.  However,  his point-of-care A1c is 8% increasing from 6.5% during his last visit.   -He maintains 140 pounds of weight loss status  post his Roux-en-Y bypass surgery.  He denies any hypoglycemia.    Recent labs reviewed. This patient will benefit the most from lifestyle medicine. - he acknowledges that there is a room for improvement in his food and drink choices. - Suggestion is made for him to avoid simple carbohydrates  from his diet including Cakes, Sweet Desserts, Ice Cream, Soda (diet and regular), Sweet Tea, Candies, Chips, Cookies, Store Bought Juices, Alcohol , Artificial Sweeteners,  Coffee Creamer, and "Sugar-free" Products, Lemonade. This will help patient to have more stable blood glucose profile and potentially avoid unintended weight gain.  The following Lifestyle Medicine recommendations according to [place]  [facility]) were discussed and and offered to patient and he  agrees to start the journey:  A. Whole Foods, Plant-Based Nutrition comprising of fruits and vegetables, plant-based proteins, whole-grain carbohydrates was discussed in detail with the patient.   A list for source of those nutrients were also provided to the patient.  Patient will use only water or unsweetened tea for hydration. B.  The need to stay away from risky substances including alcohol, smoking; obtaining 7 to 9 hours of restorative sleep, at least 150 minutes of moderate intensity exercise weekly, the importance of healthy social connections,  and stress management techniques were discussed. C.  A full color page of  Calorie density of various food groups per pound showing examples of each food groups was provided to the patient.   - Patient is advised to stick to a routine mealtimes to eat 3 meals  a day and avoid unnecessary snacks ( to snack only to correct hypoglycemia).  - I have approached patient with the following individualized plan to manage diabetes and patient agrees.  - After a  successful Roux-en-Y bariatric surgery and lost approximately 140 pounds, he was able to come off of multiple daily injections of insulin therapy. -He is advised to continue Trulicity 3 mg subcutaneously weekly.  He will be considered for additional intervention such as with low-dose metformin or basal insulin if he returns with A1c above 7% next visit.  - He is advised to continue monitoring blood glucose at least at fasting, and call clinic for blood glucose readings above 150 mg/dl 3 in a week at fasting.  - Patient specific target  for A1c; LDL, HDL, Triglycerides,  were discussed in detail.  2) BP/HTN:  -His blood pressure is controlled to target.  Marland Kitchen  His current blood pressure medications include lisinopril 40 mg p.o. daily at breakfast..  3) Lipids/HPL:  His most recent lipid panel showed improved lipid profile with LDL at 58.  He is advised to continue Lipitor 10 mg p.o. nightly.   Side effects and precautions discussed with him.  4)  Weight/Diet:  His Body mass index is 31.97 kg/m. He is status post Roux-en-Y bariatric surgery.  He is still a candidate for modest weight loss.  The above detailed lifestyle medicine will help control weight, exercise, and carbohydrates information provided.  He is approached to reengage in routine physical activity to prevent further weight gain.  5) Chronic Care/Health Maintenance: -Patient is on ACEI/ARB and Statin medications and encouraged to continue to follow up with Ophthalmology, Podiatrist at least yearly or according to recommendations, and advised to  stay away from smoking. I have recommended yearly flu vaccine and pneumonia vaccination at least every 5 years; moderate intensity exercise for up to 150 minutes weekly; and  sleep for at least 7 hours a day.  6)  Vitamin D deficiency -His most recent vitamin D level on 08/06/2018 was 51.  He is advised to continue daily maintenance dose of vitamin D3 for now.  Will recheck vitamin D level prior to  next visit.   - I advised patient to maintain close follow up with his PCP and he is bariatric team for ongoing care needs.   I spent 33 minutes in the care of the patient today including review of labs from Thyroid Function, CMP, and other relevant labs ; imaging/biopsy records (current and previous including abstractions from other facilities); face-to-face time discussing  his lab results and symptoms, medications doses, his options of short and long term treatment based on the latest standards of care / guidelines;   and documenting the encounter.  Zachary Murillo.  participated in the discussions, expressed understanding, and voiced agreement with the above plans.  All questions were answered to his satisfaction. he is encouraged to contact clinic should he have any questions or concerns prior to his return visit.    Follow up plan: -Return in about 3 months (around 08/05/2021) for A1c -[place].    Rayetta Pigg, [facility] [address] [place], [place] [address] Phone: [phone] Fax: [phone]  05/08/2021, 5:48 PM

## 2021-05-08 NOTE — Telephone Encounter (Signed)
Pt wife is on the DPR and she would like for you to give her a call, she was unable to come with pt to his appointment like she usually does and now that he is at home he is telling her you informed him he no longer has to test his sugars but on the paper you gave him it says he needs to be testing. She states he needs to hear it from you that he does need to be testing. [phone]

## 2021-05-08 NOTE — Patient Instructions (Signed)

## 2021-05-08 NOTE — Telephone Encounter (Signed)
Called patients wife and gave her the message from Dr. Dorris Fetch. Wife verbalized an understanding.

## 2021-05-16 ENCOUNTER — Ambulatory Visit: Payer: Medicare PPO | Admitting: "Endocrinology

## 2021-05-18 ENCOUNTER — Other Ambulatory Visit: Payer: Self-pay | Admitting: "Endocrinology

## 2021-05-21 DIAGNOSIS — I6350 Cerebral infarction due to unspecified occlusion or stenosis of unspecified cerebral artery: Secondary | ICD-10-CM | POA: Diagnosis not present

## 2021-06-04 ENCOUNTER — Other Ambulatory Visit: Payer: Self-pay

## 2021-06-04 ENCOUNTER — Encounter (INDEPENDENT_AMBULATORY_CARE_PROVIDER_SITE_OTHER): Payer: Self-pay | Admitting: Ophthalmology

## 2021-06-04 ENCOUNTER — Ambulatory Visit (INDEPENDENT_AMBULATORY_CARE_PROVIDER_SITE_OTHER): Payer: Medicare PPO | Admitting: Ophthalmology

## 2021-06-04 ENCOUNTER — Encounter (INDEPENDENT_AMBULATORY_CARE_PROVIDER_SITE_OTHER): Payer: Medicare PPO | Admitting: Ophthalmology

## 2021-06-04 DIAGNOSIS — D3132 Benign neoplasm of left choroid: Secondary | ICD-10-CM

## 2021-06-04 DIAGNOSIS — H33322 Round hole, left eye: Secondary | ICD-10-CM | POA: Insufficient documentation

## 2021-06-04 NOTE — Progress Notes (Signed)
? ? ?06/04/2021 ? ?  ? ?Zachary COMPLAINT ?Patient presents for  ?Zachary Complaint  ?Patient presents with  ? Retina Follow Up  ? ?Choroidal nevus left eye, nasal retina, ? ? ?HISTORY OF PRESENT ILLNESS: ?Zachary Murillo. is a 71 y.o. male who presents to the clinic today for:  ? ?HPI   ? ? Retina Follow Up   ? ?      ? Diagnosis: Other  ? Laterality: left eye  ? ?  ?  ? ? Comments   ?1 year fu ou fp ?Pt states also goes to [facility] and alternates in between facilities. ?Pt denies FOL and sees floaters in the right eye every now and then. ?Pt states no changes in vision. ? ? ? ?  ?  ?Last edited by Silvestre Moment on 06/04/2021  9:51 AM.  ?  ? ? ?Referring physician: ?Zachary Jacks, MD ?[facility] ?[address] ?[place],  [place] [address] ? ?HISTORICAL INFORMATION:  ? ?Selected notes from the [facility] ?  ? ?Lab Results  ?Component Value Date  ? HGBA1C 8.0 (A) 05/08/2021  ?  ? ?CURRENT MEDICATIONS: ?No current outpatient medications on file. (Ophthalmic Drugs)  ? ?No current facility-administered medications for this visit. (Ophthalmic Drugs)  ? ?Current Outpatient Medications (Other)  ?Medication Sig  ? Ascorbic Acid (VITAMIN C) 1000 MG tablet Take 1,000 mg by mouth daily.  ? atorvastatin (LIPITOR) 10 MG tablet TAKE (1) TABLET BY MOUTH AT BEDTIME.  ? Blood Glucose Monitoring Suppl (ACCU-CHEK GUIDE ME) w/Device KIT 1 Piece by Does not apply route as directed.  ? Calcium Citrate-Vitamin D (CALCIUM + D PO) Take by mouth 2 (two) times daily.  ? cetirizine (ZYRTEC) 10 MG tablet Take 10 mg by mouth daily as needed for allergies.   ? chlorthalidone (HYGROTON) 25 MG tablet Take 12.5 mg by mouth daily.   ? Cholecalciferol (VITAMIN D) 2000 units CAPS Take by mouth.  ? clopidogrel (PLAVIX) 75 MG tablet Take 1 tablet (75 mg total) by mouth daily.  ? docusate sodium (COLACE) 100 MG capsule Take 100 mg by mouth 2 (two) times daily.  ? glucose blood (ACCU-CHEK GUIDE) test strip Test glucose 1 time a day  ? lisinopril  (PRINIVIL,ZESTRIL) 40 MG tablet Take 40 mg by mouth every morning.  ? Multiple Vitamins-Minerals (CENTRUM PO) Take 1 tablet by mouth every morning.  ? sertraline (ZOLOFT) 100 MG tablet Take 150 mg by mouth daily.  ? sertraline (ZOLOFT) 50 MG tablet Take 1 tablet (50 mg total) by mouth daily.  ? triamcinolone (NASACORT) 55 MCG/ACT AERO nasal inhaler Place 2 sprays into the nose daily.  ? TRULICITY 3 ID/7.8EU SOPN INJECT 3MG AS DIRECTED ONCE A WEEK.  ? ?No current facility-administered medications for this visit. (Other)  ? ? ? ? ?REVIEW OF SYSTEMS: ?ROS   ?Negative for: Constitutional, Gastrointestinal, Neurological, Skin, Genitourinary, Musculoskeletal, HENT, Endocrine, Cardiovascular, Eyes, Respiratory, Psychiatric, Allergic/Imm, Heme/Lymph ?Last edited by Silvestre Moment on 06/04/2021  9:50 AM.  ?  ? ? ? ?ALLERGIES ?Allergies  ?Allergen Reactions  ? Prednisone   ?  Starts itching  ? ? ?PAST MEDICAL HISTORY ?Past Medical History:  ?Diagnosis Date  ? Acute ischemic stroke ([place]) 02/22/2013  ? CHF (congestive heart failure) ([place])   ? CVA (cerebral infarction) 01/22/2013  ? Diabetes mellitus ([place])   ? Difficulty in walking(719.7) 01/25/2013  ? History of Roux-en-Y gastric bypass   ? Hypertension   ? IMPINGEMENT SYNDROME 01/16/2009  ?  Qualifier: Diagnosis of  By: Aline Brochure MD, Dorothyann Peng    ? KNEE PAIN 01/16/2009  ? Qualifier: Diagnosis of  By: Aline Brochure MD, Dorothyann Peng    ? Knee pain, right 09/05/2011  ? KNEE, ARTHRITIS, DEGEN./OSTEO 01/30/2009  ? Qualifier: Diagnosis of  By: Aline Brochure MD, Dorothyann Peng    ? Lack of coordination 01/22/2013  ? Non-ischemic cardiomyopathy ([place])   ? Osteoarthritis of knee   ? Other obesity due to excess calories 01/30/2015  ? Overweight(278.02)   ? Pulmonary hypertension ([place])   ? 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 Formatting of this note might be different from the original. 12/2014, Dr. Flavia Shipper  ? S/P lumbar fusion 07/02/2013  ? SHOULDER PAIN 01/16/2009  ? Qualifier: Diagnosis of  By: Aline Brochure MD, Dorothyann Peng    ?  Shoulder pain, right   ? Sleep apnea   ? Sleep apnea   ? Spinal stenosis   ? Stroke [facility])   ? TENDINITIS, LEFT KNEE 01/16/2009  ? Qualifier: Diagnosis of  By: Aline Brochure MD, Dorothyann Peng    ? Vitamin D deficiency   ? ?Past Surgical History:  ?Procedure Laterality Date  ? BACK SURGERY    ? CARDIAC CATHETERIZATION    ? KNEE ARTHROSCOPY    ? NASAL SEPTUM SURGERY    ? ? ?FAMILY HISTORY ?Family History  ?Problem Relation Age of Onset  ? Heart disease Other   ? Arthritis Other   ? Cancer Other   ? Asthma Other   ? Diabetes Other   ? Breast cancer Mother   ? Heart disease Mother   ? Heart disease Father   ? Stroke Father   ? Cancer Sister   ? Heart disease Brother   ? COPD Sister   ? ? ?SOCIAL HISTORY ?Social History  ? ?Tobacco Use  ? Smoking status: Never  ? Smokeless tobacco: Never  ?Vaping Use  ? Vaping Use: Never used  ?Substance Use Topics  ? Alcohol use: Yes  ?  Comment: occ  ? Drug use: No  ? ?  ? ?  ? ?OPHTHALMIC EXAM: ? ?Base Eye Exam   ? ? Visual Acuity (ETDRS)   ? ?   Right Left  ? Dist [place] 2 20/25 20/20 -1  ? ?  ?  ? ? Tonometry (Tonopen, 9:56 AM)   ? ?   Right Left  ? Pressure 10 10  ? ?  ?  ? ? Pupils   ? ?   Pupils APD  ? Right PERRL None  ? Left PERRL None  ? ?  ?  ? ? Visual Fields   ? ?   Left Right  ?  Full Full  ? ?  ?  ? ? Extraocular Movement   ? ?   Right Left  ?  Full Full  ? ?  ?  ? ? Neuro/Psych   ? ? Oriented x3: Yes  ? Mood/Affect: Normal  ? ?  ?  ? ? Dilation   ? ? Both eyes: 1.0% Mydriacyl, 2.5% Phenylephrine @ 9:56 AM  ? ?  ?  ? ?  ? ?Slit Lamp and Fundus Exam   ? ? External Exam   ? ?   Right Left  ? External Normal Normal  ? ?  ?  ? ? Slit Lamp Exam   ? ?   Right Left  ? Lids/Lashes Normal Normal  ? Conjunctiva/Sclera White and quiet White and quiet  ? Cornea Clear Clear  ? Anterior Chamber Deep and quiet Deep  and quiet  ? Iris Round and reactive Round and reactive  ? Lens Centered posterior chamber intraocular lens Centered posterior chamber intraocular lens  ? Anterior Vitreous Normal Normal  ? ?   ?  ? ? Fundus Exam   ? ?   Right Left  ? Posterior Vitreous ,, Posterior vitreous detachment Posterior vitreous detachment  ? Disc Normal Normal  ? C/D Ratio 0.3 0.3  ? Macula Normal   ? Vessels Normal   ? Periphery Normal Choroidal nevus, nasal, at anterior equator, 9 meridian, 5 x 5 disc diameter, nearly flat lesion, no fluid, no atrophy, no lipofuscin, no vascularity, 28 diopter and 20 diopter lens exam, no contact ora serrata  ? ?  ?  ? ?  ? ? ?IMAGING AND PROCEDURES  ?Imaging and Procedures for 06/04/21 ? ?Color Fundus Photography Optos - OU - Both Eyes   ? ?   ?Right Eye ?Progression has been stable. Disc findings include normal observations. Macula : normal observations. Vessels : normal observations. Periphery : normal observations.  ? ?Left Eye ?Progression has been stable. Disc findings include normal observations. Macula : normal observations. Vessels : normal observations.  ? ?Notes ?Choroidal nevus nasally, no subretinal fluid, no interval change over time, OS, only the posterior portion of lesion seen ? ?  ? ?B-Scan Ultrasound - OS - Left Eye   ? ?   ?NO SRF, 1.43 MM thickness nasally ? ?  ? ? ?  ?  ? ?  ?ASSESSMENT/PLAN: ? ?Choroidal nevus of left eye ?Risk factors for malignant transformation of a choroidal nevus: ?[  ]thickness greater than 3 mm,  ?[  ]subretinal fluid,  ?[  ]symptoms,  ?[  ]orange pigment present,  ?[  ]margin within 3 mm of the optic disc,  ?[  ]ultrasonographic hollowness (versus solid/flat), medium internal reflectivity on scan ?[  ]absence of halo (A halo refers to a pigmented choroidal nevus surrounded by a circular band of depigmentation) ?[  ]absence of drusen ?[ x ]size larger than 75m basal diameter (over 3 dd size) ?[  ]observed growth ? ?1/7 risk factors ? ?No change over time ? ?AJO: 202, June 2019, 100-108 ? ?The nature of choroidal nevus was discussed with the patient and an informational form was offered.  Photo documentation was discussed with the patient.   Periodic follow-up may be needed for a lifetime. The patient's questions were answered. At minimum, annual exams will be needed if no signs of early growth. ? ?Retinal hole, left ?No new holes today, prior hole supero-nasal OS well

## 2021-06-04 NOTE — Assessment & Plan Note (Signed)
No new holes today, prior hole supero-nasal OS well treated ?

## 2021-06-04 NOTE — Assessment & Plan Note (Signed)
Risk factors for malignant transformation of a choroidal nevus: ?[  ]thickness greater than 3 mm,  ?[  ]subretinal fluid,  ?[  ]symptoms,  ?[  ]orange pigment present,  ?[  ]margin within 3 mm of the optic disc,  ?[  ]ultrasonographic hollowness (versus solid/flat), medium internal reflectivity on scan ?[  ]absence of halo (A halo refers to a pigmented choroidal nevus surrounded by a circular band of depigmentation) ?[  ]absence of drusen ?[ x ]size larger than 11m basal diameter (over 3 dd size) ?[  ]observed growth ? ?1/7 risk factors ? ?No change over time ? ?AJO: 202, June 2019, 100-108 ? ?The nature of choroidal nevus was discussed with the patient and an informational form was offered.  Photo documentation was discussed with the patient.  Periodic follow-up may be needed for a lifetime. The patient's questions were answered. At minimum, annual exams will be needed if no signs of early growth. ?

## 2021-06-05 ENCOUNTER — Encounter (INDEPENDENT_AMBULATORY_CARE_PROVIDER_SITE_OTHER): Payer: Medicare PPO | Admitting: Ophthalmology

## 2021-06-26 DIAGNOSIS — R972 Elevated prostate specific antigen [PSA]: Secondary | ICD-10-CM | POA: Diagnosis not present

## 2021-06-28 ENCOUNTER — Other Ambulatory Visit: Payer: Self-pay | Admitting: Physician Assistant

## 2021-06-28 ENCOUNTER — Ambulatory Visit
Admission: RE | Admit: 2021-06-28 | Discharge: 2021-06-28 | Disposition: A | Discharge status: Home / Self Care | Payer: Medicare PPO | Source: Ambulatory Visit | Attending: Physician Assistant | Admitting: Physician Assistant

## 2021-06-28 DIAGNOSIS — W19XXXA Unspecified fall, initial encounter: Secondary | ICD-10-CM

## 2021-06-28 DIAGNOSIS — H5789 Other specified disorders of eye and adnexa: Secondary | ICD-10-CM | POA: Diagnosis not present

## 2021-06-28 DIAGNOSIS — R202 Paresthesia of skin: Secondary | ICD-10-CM | POA: Diagnosis not present

## 2021-06-28 DIAGNOSIS — H538 Other visual disturbances: Secondary | ICD-10-CM | POA: Diagnosis not present

## 2021-06-28 DIAGNOSIS — Z7901 Long term (current) use of anticoagulants: Secondary | ICD-10-CM | POA: Diagnosis not present

## 2021-06-28 DIAGNOSIS — S0990XA Unspecified injury of head, initial encounter: Secondary | ICD-10-CM | POA: Diagnosis not present

## 2021-06-28 DIAGNOSIS — R200 Anesthesia of skin: Secondary | ICD-10-CM | POA: Diagnosis not present

## 2021-07-04 DIAGNOSIS — M2578 Osteophyte, vertebrae: Secondary | ICD-10-CM | POA: Diagnosis not present

## 2021-07-04 DIAGNOSIS — M4316 Spondylolisthesis, lumbar region: Secondary | ICD-10-CM | POA: Diagnosis not present

## 2021-07-04 DIAGNOSIS — Z48811 Encounter for surgical aftercare following surgery on the nervous system: Secondary | ICD-10-CM | POA: Diagnosis not present

## 2021-07-04 DIAGNOSIS — M419 Scoliosis, unspecified: Secondary | ICD-10-CM | POA: Diagnosis not present

## 2021-07-04 DIAGNOSIS — M5386 Other specified dorsopathies, lumbar region: Secondary | ICD-10-CM | POA: Diagnosis not present

## 2021-07-04 DIAGNOSIS — Z981 Arthrodesis status: Secondary | ICD-10-CM | POA: Diagnosis not present

## 2021-07-04 DIAGNOSIS — Z79899 Other long term (current) drug therapy: Secondary | ICD-10-CM | POA: Diagnosis not present

## 2021-07-09 ENCOUNTER — Encounter: Payer: Self-pay | Admitting: Urology

## 2021-07-09 ENCOUNTER — Ambulatory Visit (INDEPENDENT_AMBULATORY_CARE_PROVIDER_SITE_OTHER): Payer: Medicare PPO | Admitting: Urology

## 2021-07-09 VITALS — BP 121/70 | HR 81 | Ht 70.0 in | Wt 213.0 lb

## 2021-07-09 DIAGNOSIS — R972 Elevated prostate specific antigen [PSA]: Secondary | ICD-10-CM

## 2021-07-09 DIAGNOSIS — N400 Enlarged prostate without lower urinary tract symptoms: Secondary | ICD-10-CM

## 2021-07-09 LAB — URINALYSIS, ROUTINE W REFLEX MICROSCOPIC
Bilirubin, UA: NEGATIVE
Glucose, UA: NEGATIVE
Ketones, UA: NEGATIVE
Leukocytes,UA: NEGATIVE
Nitrite, UA: NEGATIVE
Protein,UA: NEGATIVE
RBC, UA: NEGATIVE
Specific Gravity, UA: 1.02 (ref 1.005–1.030)
Urobilinogen, Ur: 0.2 mg/dL (ref 0.2–1.0)
pH, UA: 5.5 (ref 5.0–7.5)

## 2021-07-09 NOTE — Progress Notes (Signed)
? ?07/09/2021 ?2:49 PM  ? ?Zachary Murillo. ?[birth year] ?[id number] ? ?Referring provider: Johna Roles, PA ?[address] ?[address] ?[place],  [place] [address] ? ?No chief complaint on file. ? ? ?HPI: ? ?New patient-Zachary Murillo is seen today- ? ?1) PSA elevation-his PSA in October 2022 was 5.1. This rose when rechecked April 2023 to 7.1.  He has no history of BPH.  AUA symptom score is 2. No prostate bx. No FH of PCa - maybe dad.  ? ?He is on Plavix - for CVA.  He is on terazosin 10 mg. ? ?He was Higher education careers adviser at [employer].  ? ? ? ?PMH: ?Past Medical History:  ?Diagnosis Date  ? Acute ischemic stroke ([place]) 02/22/2013  ? CHF (congestive heart failure) ([place])   ? CVA (cerebral infarction) 01/22/2013  ? Diabetes mellitus ([place])   ? Difficulty in walking(719.7) 01/25/2013  ? History of Roux-en-Y gastric bypass   ? Hypertension   ? IMPINGEMENT SYNDROME 01/16/2009  ? Qualifier: Diagnosis of  By: Aline Brochure MD, Dorothyann Peng    ? KNEE PAIN 01/16/2009  ? Qualifier: Diagnosis of  By: Aline Brochure MD, Dorothyann Peng    ? Knee pain, right 09/05/2011  ? KNEE, ARTHRITIS, DEGEN./OSTEO 01/30/2009  ? Qualifier: Diagnosis of  By: Aline Brochure MD, Dorothyann Peng    ? Lack of coordination 01/22/2013  ? Non-ischemic cardiomyopathy ([place])   ? Osteoarthritis of knee   ? Other obesity due to excess calories 01/30/2015  ? Overweight(278.02)   ? Pulmonary hypertension ([place])   ? 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 Formatting of this note might be different from the original. 12/2014, Dr. Flavia Shipper  ? S/P lumbar fusion 07/02/2013  ? SHOULDER PAIN 01/16/2009  ? Qualifier: Diagnosis of  By: Aline Brochure MD, Dorothyann Peng    ? Shoulder pain, right   ? Sleep apnea   ? Sleep apnea   ? Spinal stenosis   ? Stroke [facility])   ? TENDINITIS, LEFT KNEE 01/16/2009  ? Qualifier: Diagnosis of  By: Aline Brochure MD, Dorothyann Peng    ? Vitamin D deficiency   ? ? ?Surgical History: ?Past Surgical History:  ?Procedure Laterality Date  ? BACK SURGERY    ? CARDIAC CATHETERIZATION    ? KNEE ARTHROSCOPY    ? NASAL SEPTUM SURGERY     ? ? ?Home Medications:  ?Allergies as of 07/09/2021   ? ?   Reactions  ? Prednisone   ? Starts itching  ? ?  ? ?  ?Medication List  ?  ? ?  ? Accurate as of Jul 09, 2021  2:49 PM. If you have any questions, ask your nurse or doctor.  ?  ?  ? ?  ? ?Accu-Chek Guide Me w/Device Kit ?1 Piece by Does not apply route as directed. ?  ?Accu-Chek Guide test strip ?Generic drug: glucose blood ?Test glucose 1 time a day ?  ?atorvastatin 10 MG tablet ?Commonly known as: LIPITOR ?TAKE (1) TABLET BY MOUTH AT BEDTIME. ?  ?CALCIUM + D PO ?Take by mouth 2 (two) times daily. ?  ?CENTRUM PO ?Take 1 tablet by mouth every morning. ?  ?cetirizine 10 MG tablet ?Commonly known as: ZYRTEC ?Take 10 mg by mouth daily as needed for allergies. ?  ?chlorthalidone 25 MG tablet ?Commonly known as: HYGROTON ?Take 12.5 mg by mouth daily. ?  ?clopidogrel 75 MG tablet ?Commonly known as: PLAVIX ?Take 1 tablet (75 mg total) by mouth daily. ?  ?docusate sodium 100 MG capsule ?Commonly known as: COLACE ?Take 100  mg by mouth 2 (two) times daily. ?  ?fluticasone 50 MCG/ACT nasal spray ?Commonly known as: FLONASE ?Place into the nose. ?  ?lisinopril 40 MG tablet ?Commonly known as: ZESTRIL ?Take 40 mg by mouth every morning. ?  ?sertraline 50 MG tablet ?Commonly known as: ZOLOFT ?Take 1 tablet (50 mg total) by mouth daily. ?  ?sertraline 100 MG tablet ?Commonly known as: ZOLOFT ?Take 150 mg by mouth daily. ?  ?triamcinolone 55 MCG/ACT Aero nasal inhaler ?Commonly known as: NASACORT ?Place 2 sprays into the nose daily. ?  ?Trulicity 3 DZ/3.2DJ Sopn ?Generic drug: Dulaglutide ?INJECT 3MG AS DIRECTED ONCE A WEEK. ?  ?vitamin C 1000 MG tablet ?Take 1,000 mg by mouth daily. ?  ?Vitamin D 50 MCG (2000 UT) Caps ?Take by mouth. ?  ? ?  ? ? ?Allergies:  ?Allergies  ?Allergen Reactions  ? Prednisone   ?  Starts itching  ? ? ?Family History: ?Family History  ?Problem Relation Age of Onset  ? Heart disease Other   ? Arthritis Other   ? Cancer Other   ? Asthma Other   ?  Diabetes Other   ? Breast cancer Mother   ? Heart disease Mother   ? Heart disease Father   ? Stroke Father   ? Cancer Sister   ? Heart disease Brother   ? COPD Sister   ? ? ?Social History:  reports that he has never smoked. He has never used smokeless tobacco. He reports current alcohol use. He reports that he does not use drugs. ? ? ?Physical Exam: ?BP 121/70   Pulse 81   Ht 5' 10"  (1.778 m)   Wt 213 lb (96.6 kg)   BMI 30.56 kg/m?   ?Constitutional:  Alert and oriented, No acute distress. ?HEENT: [place] AT, moist mucus membranes.  Trachea midline, no masses. ?Cardiovascular: No clubbing, cyanosis, or edema. ?Respiratory: Normal respiratory effort, no increased work of breathing. ?GI: Abdomen is soft, nontender, nondistended, no abdominal masses ?GU: No CVA tenderness ?Lymph: No cervical or inguinal lymphadenopathy. ?Skin: No rashes, bruises or suspicious lesions. ?Neurologic: Grossly intact, no focal deficits, moving all 4 extremities. ?Psychiatric: Normal mood and affect. ?DRE: prostate about 50 g and smooth without hard area or nodule  ? ?Laboratory Data: ?Lab Results  ?Component Value Date  ? WBC 10.5 06/07/2018  ? HGB 15.6 06/07/2018  ? HCT 46.2 06/07/2018  ? MCV 89.2 06/07/2018  ? PLT 288 06/07/2018  ? ? ?Lab Results  ?Component Value Date  ? CREATININE 0.85 04/26/2021  ? ? ?No results found for: PSA ? ?No results found for: TESTOSTERONE ? ?Lab Results  ?Component Value Date  ? HGBA1C 8.0 (A) 05/08/2021  ? ? ?Urinalysis ?No results found for: COLORURINE, APPEARANCEUR, [place], [place], [place], [place], [place], KETONESUR, PROTEINUR, [place], NITRITE, LEUKOCYTESUR ? ?Lab Results  ?Component Value Date  ? LABMICR 4.7 04/24/2020  ? ? ?Pertinent Imaging: ? ? ?Assessment & Plan:   ? ?1. Elevated PSA ?His exam is benign.  He does have some BPH.  We went over the nature of PSA elevation including benign and malignant etiologies.  We went over the nature risk benefits of continued surveillance, biopsy or  MRI.  Also discussed other lab test.  Because his PSA went up I would recommend an MRI.  We can get a good gauge of his PSA density and prostate size.  If the MRI is reassuring we will recheck a PSA or if it is worrisome we can set him up for  a biopsy, but he will need to be off Plavix. ? ?The leave for a cruise to [place] and [place] will be back after [employer].  ? ?- Urinalysis, Routine w reflex microscopic ? ? ?No follow-ups on file. ? ?Festus Aloe, MD ? ?[facility]  ?[place], [place] [address] ?[phone] ? ? ?

## 2021-07-10 ENCOUNTER — Ambulatory Visit: Payer: Medicare PPO | Admitting: "Endocrinology

## 2021-07-10 ENCOUNTER — Encounter: Payer: Self-pay | Admitting: "Endocrinology

## 2021-07-10 VITALS — BP 118/78 | HR 68 | Ht 70.0 in | Wt 211.8 lb

## 2021-07-10 DIAGNOSIS — E1159 Type 2 diabetes mellitus with other circulatory complications: Secondary | ICD-10-CM

## 2021-07-10 DIAGNOSIS — E782 Mixed hyperlipidemia: Secondary | ICD-10-CM

## 2021-07-10 DIAGNOSIS — I10 Essential (primary) hypertension: Secondary | ICD-10-CM | POA: Diagnosis not present

## 2021-07-10 NOTE — Progress Notes (Signed)
? ?07/10/2021 ? ?Endocrinology follow-up note ? ? ?Subjective:  ? ? Patient ID: Zachary Shadow., male    DOB: 1950-09-13, PCP Johna Roles, PA ? ? ?Past Medical History:  ?Diagnosis Date  ? Acute ischemic stroke ([place]) 02/22/2013  ? CHF (congestive heart failure) ([place])   ? CVA (cerebral infarction) 01/22/2013  ? Diabetes mellitus ([place])   ? Difficulty in walking(719.7) 01/25/2013  ? History of Roux-en-Y gastric bypass   ? Hypertension   ? IMPINGEMENT SYNDROME 01/16/2009  ? Qualifier: Diagnosis of  By: Aline Brochure MD, Dorothyann Peng    ? KNEE PAIN 01/16/2009  ? Qualifier: Diagnosis of  By: Aline Brochure MD, Dorothyann Peng    ? Knee pain, right 09/05/2011  ? KNEE, ARTHRITIS, DEGEN./OSTEO 01/30/2009  ? Qualifier: Diagnosis of  By: Aline Brochure MD, Dorothyann Peng    ? Lack of coordination 01/22/2013  ? Non-ischemic cardiomyopathy ([place])   ? Osteoarthritis of knee   ? Other obesity due to excess calories 01/30/2015  ? Overweight(278.02)   ? Pulmonary hypertension ([place])   ? 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 Formatting of this note might be different from the original. 12/2014, Dr. Flavia Shipper  ? S/P lumbar fusion 07/02/2013  ? SHOULDER PAIN 01/16/2009  ? Qualifier: Diagnosis of  By: Aline Brochure MD, Dorothyann Peng    ? Shoulder pain, right   ? Sleep apnea   ? Sleep apnea   ? Spinal stenosis   ? Stroke [facility])   ? TENDINITIS, LEFT KNEE 01/16/2009  ? Qualifier: Diagnosis of  By: Aline Brochure MD, Dorothyann Peng    ? Vitamin D deficiency   ? ?Past Surgical History:  ?Procedure Laterality Date  ? BACK SURGERY    ? CARDIAC CATHETERIZATION    ? KNEE ARTHROSCOPY    ? NASAL SEPTUM SURGERY    ? ?Social History  ? ?Socioeconomic History  ? Marital status: Married  ?  Spouse name: Otila Kluver   ? Number of children: 1  ? Years of education: Masters  ? Highest education level: Not on file  ?Occupational History  ? Occupation: Retired-Drivers Ed   ?Tobacco Use  ? Smoking status: Never  ? Smokeless tobacco: Never  ?Vaping Use  ? Vaping Use: Never used  ?Substance and Sexual Activity  ? Alcohol use: Yes  ?   Comment: occ  ? Drug use: No  ? Sexual activity: Yes  ?  Birth control/protection: Surgical  ?Other Topics Concern  ? Not on file  ?Social History Narrative  ? Lives with wife-42 years  ? One daughter who lives in [place] grandchildren, one expecting  ?   ? 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   ? Diet: eats all food groups  ? Caffeine: 2 cups of coffee daily, tea every other day  ? Water: 2-3 cups daily  ?   ? Wears seat belt  ? Does not use phone while driving   ? Smoke detectors   ?   ?   ?   ? ?Social Determinants of Health  ? ?Financial Resource Strain: Not on file  ?Food Insecurity: Not on file  ?Transportation Needs: Not on file  ?Physical Activity: Not on file  ?Stress: Not on file  ?Social Connections: Not on file  ? ?Outpatient Encounter Medications as of 07/10/2021  ?Medication Sig  ? Ascorbic Acid (VITAMIN C) 1000 MG tablet Take 1,000 mg by mouth daily.  ? atorvastatin (LIPITOR) 10 MG tablet TAKE (1) TABLET BY MOUTH AT BEDTIME.  ? Blood  Glucose Monitoring Suppl (ACCU-CHEK GUIDE ME) w/Device KIT 1 Piece by Does not apply route as directed.  ? Calcium Citrate-Vitamin D (CALCIUM + D PO) Take by mouth 2 (two) times daily.  ? cetirizine (ZYRTEC) 10 MG tablet Take 10 mg by mouth daily as needed for allergies.   ? chlorthalidone (HYGROTON) 25 MG tablet Take 12.5 mg by mouth daily.   ? Cholecalciferol (VITAMIN D) 2000 units CAPS Take by mouth.  ? clopidogrel (PLAVIX) 75 MG tablet Take 1 tablet (75 mg total) by mouth daily.  ? docusate sodium (COLACE) 100 MG capsule Take 100 mg by mouth 2 (two) times daily.  ? fluticasone (FLONASE) 50 MCG/ACT nasal spray Place into the nose.  ? glucose blood (ACCU-CHEK GUIDE) test strip Test glucose 1 time a day  ? lisinopril (PRINIVIL,ZESTRIL) 40 MG tablet Take 40 mg by mouth every morning.  ? Multiple Vitamins-Minerals (CENTRUM PO) Take 1 tablet by mouth every morning.  ? sertraline (ZOLOFT) 100 MG tablet Take 150 mg  by mouth daily.  ? sertraline (ZOLOFT) 50 MG tablet Take 1 tablet (50 mg total) by mouth daily.  ? TRULICITY 3 FI/4.3PI SOPN INJECT 3MG AS DIRECTED ONCE A WEEK.  ? [DISCONTINUED] triamcinolone (NASACORT) 55 MCG/ACT AERO nasal inhaler Place 2 sprays into the nose daily. (Patient not taking: Reported on 07/09/2021)  ? ?No facility-administered encounter medications on file as of 07/10/2021.  ? ?ALLERGIES: ?Allergies  ?Allergen Reactions  ? Prednisone   ?  Starts itching  ? ?VACCINATION STATUS: ?Immunization History  ?Administered Date(s) Administered  ? Influenza, High Dose Seasonal PF 11/29/2016, 01/20/2018  ? Influenza, Seasonal, Injecte, Preservative Fre 12/27/2012  ? Influenza,inj,Quad PF,6+ Mos 01/06/2015  ? Influenza-Unspecified 12/23/2013, 12/25/2015, 11/19/2018  ? PFIZER(Purple Top)SARS-COV-2 Vaccination 04/03/2019, 04/24/2019, 12/30/2019  ? Zoster, Live 04/25/2014  ? ? ?Diabetes ?He presents for his follow-up diabetic visit. He has type 2 diabetes mellitus. Onset time: He was diagnosed at approximate age of 15 years. His disease course has been improving. There are no hypoglycemic associated symptoms. Pertinent negatives for hypoglycemia include no confusion, headaches, pallor or seizures. Pertinent negatives for diabetes include no chest pain, no fatigue, no polydipsia, no polyphagia, no polyuria and no weakness. There are no hypoglycemic complications. Symptoms are improving. Diabetic complications include a CVA and heart disease. Risk factors for coronary artery disease include diabetes mellitus, dyslipidemia, hypertension, male sex, obesity and sedentary lifestyle. Current diabetic treatment includes oral agent (monotherapy) (Trulicity 1.5 weekly). He is compliant with treatment all of the time. His weight is decreasing steadily. He has had a previous visit with a dietitian. He participates in exercise intermittently. His home blood glucose trend is decreasing steadily. His breakfast blood glucose range is  generally 110-130 mg/dl. His bedtime blood glucose range is generally 130-140 mg/dl. His overall blood glucose range is 130-140 mg/dl. Zachary Murillo presents with his logs showing significant improvement, averaging between 100-130 mg per DL.  He decided to return before his long trip to [place].  Last  POC A1c was 8%.  He did not document any hypoglycemia.) An ACE inhibitor/angiotensin II receptor blocker is being taken. He sees a podiatrist.Eye exam is current.  ?Hyperlipidemia ?This is a chronic problem. The current episode started more than 1 year ago. The problem is controlled. Recent lipid tests were reviewed and are normal. Exacerbating diseases include chronic renal disease, diabetes and obesity. Pertinent negatives include no chest pain, myalgias or shortness of breath. Current antihyperlipidemic treatment includes statins. The current treatment provides moderate improvement of lipids. Compliance  problems include adherence to exercise.  Risk factors for coronary artery disease include diabetes mellitus, dyslipidemia, hypertension, male sex, a sedentary lifestyle, obesity and family history.  ?Hypertension ?This is a chronic problem. The current episode started more than 1 year ago. The problem has been gradually improving since onset. The problem is controlled. Pertinent negatives include no chest pain, headaches, neck pain, palpitations or shortness of breath. There are no associated agents to hypertension. Risk factors for coronary artery disease include diabetes mellitus, dyslipidemia, male gender, obesity and sedentary lifestyle. Past treatments include ACE inhibitors, diuretics and calcium channel blockers. The current treatment provides moderate improvement. Compliance problems include exercise and diet.  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 Identifiable causes of hypertension include chronic renal disease and hyperaldosteronism.  ? ?Review of systems ? ?Constitutional: + Lost 4 pounds since  last visit, current Body mass index is 30.39 kg/m?. , no fatigue, no subjective hyperthermia, no subjective hypothermia ? ? ? ?Objective:  ?  ?BP 118/78   Pulse 68   Ht 5' 10"  (1.778 m)   Wt 211 lb 12.8

## 2021-07-10 NOTE — Patient Instructions (Signed)

## 2021-07-12 DIAGNOSIS — I428 Other cardiomyopathies: Secondary | ICD-10-CM | POA: Diagnosis not present

## 2021-07-12 DIAGNOSIS — R931 Abnormal findings on diagnostic imaging of heart and coronary circulation: Secondary | ICD-10-CM | POA: Diagnosis not present

## 2021-07-12 DIAGNOSIS — I5042 Chronic combined systolic (congestive) and diastolic (congestive) heart failure: Secondary | ICD-10-CM | POA: Diagnosis not present

## 2021-07-12 DIAGNOSIS — I453 Trifascicular block: Secondary | ICD-10-CM | POA: Diagnosis not present

## 2021-07-12 DIAGNOSIS — I152 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: Secondary | ICD-10-CM | POA: Diagnosis not present

## 2021-07-12 DIAGNOSIS — I272 Pulmonary hypertension, unspecified: Secondary | ICD-10-CM | POA: Diagnosis not present

## 2021-07-12 DIAGNOSIS — I10 Essential (primary) hypertension: Secondary | ICD-10-CM | POA: Diagnosis not present

## 2021-07-17 ENCOUNTER — Other Ambulatory Visit: Payer: Self-pay | Admitting: "Endocrinology

## 2021-08-09 ENCOUNTER — Encounter: Payer: Self-pay | Admitting: "Endocrinology

## 2021-08-09 ENCOUNTER — Ambulatory Visit: Payer: Medicare PPO | Admitting: "Endocrinology

## 2021-08-09 VITALS — BP 124/78 | HR 64 | Ht 70.0 in | Wt 205.0 lb

## 2021-08-09 DIAGNOSIS — E782 Mixed hyperlipidemia: Secondary | ICD-10-CM | POA: Diagnosis not present

## 2021-08-09 DIAGNOSIS — E1159 Type 2 diabetes mellitus with other circulatory complications: Secondary | ICD-10-CM | POA: Diagnosis not present

## 2021-08-09 DIAGNOSIS — I10 Essential (primary) hypertension: Secondary | ICD-10-CM | POA: Diagnosis not present

## 2021-08-09 LAB — POCT GLYCOSYLATED HEMOGLOBIN (HGB A1C): HbA1c, POC (controlled diabetic range): 6.4 % (ref 0.0–7.0)

## 2021-08-09 NOTE — Progress Notes (Signed)
08/09/2021  Endocrinology follow-up note   Subjective:    Patient ID: Zachary Murillo., male    DOB: 02-11-1951, PCP Zachary Roles, PA   Past Medical History:  Diagnosis Date   Acute ischemic stroke ([place]) 02/22/2013   CHF (congestive heart failure) ([facility])    CVA (cerebral infarction) 01/22/2013   Diabetes mellitus ([place])    Difficulty in walking(719.7) 01/25/2013   History of Roux-en-Y gastric bypass    Hypertension    IMPINGEMENT SYNDROME 01/16/2009   Qualifier: Diagnosis of  By: Zachary Brochure MD, [place] 01/16/2009   Qualifier: Diagnosis of  By: Zachary Brochure MD, [place]     Knee pain, right 09/05/2011   KNEE, ARTHRITIS, DEGEN./OSTEO 01/30/2009   Qualifier: Diagnosis of  By: Zachary Brochure MD, [place]     Lack of coordination 01/22/2013   Non-ischemic cardiomyopathy ([place])    Osteoarthritis of knee    Other obesity due to excess calories 01/30/2015   Overweight(278.02)    Pulmonary hypertension ([place])    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 12/2014, Dr. Flavia Murillo   S/P lumbar fusion 07/02/2013   SHOULDER PAIN 01/16/2009   Qualifier: Diagnosis of  By: Zachary Brochure MD, [place]     Shoulder pain, right    Sleep apnea    Sleep apnea    Spinal stenosis    Stroke [facility])    TENDINITIS, LEFT KNEE 01/16/2009   Qualifier: Diagnosis of  By: Zachary Brochure MD, Arlana Hove D deficiency    Past Surgical History:  Procedure Laterality Date   BACK SURGERY     CARDIAC CATHETERIZATION     KNEE ARTHROSCOPY     NASAL SEPTUM SURGERY     Social History   Socioeconomic History   Marital status: Married    Spouse name: Zachary Murillo    Number of children: 1   Years of education: Masters   Highest education level: Not on file  Occupational History   Occupation: Retired-Drivers Ed   Tobacco Use   Smoking status: Never   Smokeless tobacco: Never  Vaping Use   Vaping Use: Never used  Substance and Sexual Activity   Alcohol use: Yes     Comment: occ   Drug use: No   Sexual activity: Yes    Birth control/protection: Surgical  Other Topics Concern   Not on file  Social History Narrative   Lives with wife-42 years   One daughter who lives in [place] grandchildren, one expecting      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, Warehouse manager racing, fishing-Cubs and Curator      Diet: eats all food groups   Caffeine: 2 cups of coffee daily, tea every other day   Water: 2-3 cups daily      Wears seat belt   Does not use phone while driving    Oceanographer             Social Determinants of Health   Financial Resource Strain: Not on file  Food Insecurity: Not on file  Transportation Needs: Not on file  Physical Activity: Not on file  Stress: Not on file  Social Connections: Not on file   Outpatient Encounter Medications as of 08/09/2021  Medication Sig   Ascorbic Acid (VITAMIN C) 1000 MG tablet Take 1,000 mg by mouth daily.   atorvastatin (LIPITOR) 10 MG tablet TAKE (1) TABLET BY MOUTH AT BEDTIME.   Blood  Glucose Monitoring Suppl (ACCU-CHEK GUIDE ME) w/Device KIT 1 Piece by Does not apply route as directed.   Calcium Citrate-Vitamin D (CALCIUM + D PO) Take by mouth 2 (two) times daily.   cetirizine (ZYRTEC) 10 MG tablet Take 10 mg by mouth daily as needed for allergies.    Cholecalciferol (VITAMIN D) 2000 units CAPS Take by mouth.   clopidogrel (PLAVIX) 75 MG tablet Take 1 tablet (75 mg total) by mouth daily.   docusate sodium (COLACE) 100 MG capsule Take 100 mg by mouth 2 (two) times daily. (Patient not taking: Reported on 08/09/2021)   fluticasone (FLONASE) 50 MCG/ACT nasal spray Place into the nose.   glucose blood (ACCU-CHEK GUIDE) test strip Test glucose 1 time a day   hydrochlorothiazide (HYDRODIURIL) 12.5 MG tablet Take 12.5 mg by mouth daily.   lisinopril (PRINIVIL,ZESTRIL) 40 MG tablet Take 40 mg by mouth every morning.   Multiple Vitamins-Minerals (CENTRUM PO) Take 1 tablet by mouth every  morning.   sertraline (ZOLOFT) 100 MG tablet Take 150 mg by mouth daily.   sertraline (ZOLOFT) 50 MG tablet Take 1 tablet (50 mg total) by mouth daily.   TRULICITY 3 VX/7.9TJ SOPN INJECT 3MG AS DIRECTED ONCE A WEEK.   [DISCONTINUED] chlorthalidone (HYGROTON) 25 MG tablet Take 12.5 mg by mouth daily.    No facility-administered encounter medications on file as of 08/09/2021.   ALLERGIES: Allergies  Allergen Reactions   Prednisone     Starts itching   VACCINATION STATUS: Immunization History  Administered Date(s) Administered   Influenza, High Dose Seasonal PF 11/29/2016, 01/20/2018   Influenza, Seasonal, Injecte, Preservative Fre 12/27/2012   Influenza,inj,Quad PF,6+ Mos 01/06/2015   Influenza-Unspecified 12/23/2013, 12/25/2015, 11/19/2018   PFIZER(Purple Top)SARS-COV-2 Vaccination 04/03/2019, 04/24/2019, 12/30/2019   Zoster, Live 04/25/2014    Diabetes He presents for his follow-up diabetic visit. He has type 2 diabetes mellitus. Onset time: He was diagnosed at approximate age of 59 years. His disease course has been improving. There are no hypoglycemic associated symptoms. Pertinent negatives for hypoglycemia include no confusion, headaches, pallor or seizures. Pertinent negatives for diabetes include no chest pain, no fatigue, no polydipsia, no polyphagia, no polyuria and no weakness. There are no hypoglycemic complications. Symptoms are improving. Diabetic complications include a CVA and heart disease. Risk factors for coronary artery disease include diabetes mellitus, dyslipidemia, hypertension, male sex, obesity and sedentary lifestyle. Current diabetic treatment includes oral agent (monotherapy) (Trulicity 1.5 weekly). He is compliant with treatment all of the time. His weight is decreasing steadily. He has had a previous visit with a dietitian. He participates in exercise intermittently. His home blood glucose trend is decreasing steadily. His breakfast blood glucose range is generally  110-130 mg/dl. His bedtime blood glucose range is generally 130-140 mg/dl. His overall blood glucose range is 130-140 mg/dl. Zachary Murillo presents with his logs showing significant improvement.  His point-of-care A1c is 6.4% improved from 8%.  He did not document hypoglycemia.  ) An ACE inhibitor/angiotensin II receptor blocker is being taken. He sees a podiatrist.Eye exam is current.  Hyperlipidemia This is a chronic problem. The current episode started more than 1 year ago. The problem is controlled. Recent lipid tests were reviewed and are normal. Exacerbating diseases include chronic renal disease, diabetes and obesity. Pertinent negatives include no chest pain, myalgias or shortness of breath. Current antihyperlipidemic treatment includes statins. The current treatment provides moderate improvement of lipids. Compliance problems include adherence to exercise.  Risk factors for coronary artery disease include diabetes mellitus, dyslipidemia, hypertension,  male sex, a sedentary lifestyle, obesity and family history.  Hypertension This is a chronic problem. The current episode started more than 1 year ago. The problem has been gradually improving since onset. The problem is controlled. Pertinent negatives include no chest pain, headaches, neck pain, palpitations or shortness of breath. There are no associated agents to hypertension. Risk factors for coronary artery disease include diabetes mellitus, dyslipidemia, male gender, obesity and sedentary lifestyle. Past treatments include ACE inhibitors, diuretics and calcium channel blockers. The current treatment provides moderate improvement. Compliance problems include exercise and diet.  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 Identifiable causes of hypertension include chronic renal disease and hyperaldosteronism.   Review of systems  Constitutional: + Lost 4 pounds since last visit, current Body mass index is 29.41 kg/m. , no fatigue, no subjective  hyperthermia, no subjective hypothermia    Objective:    BP 124/78   Pulse 64   Ht _0  (1.778 m)   Wt 205 lb (93 kg)   BMI 29.41 kg/m   Wt Readings from Last 3 Encounters:  08/09/21 205 lb (93 kg)  07/10/21 211 lb 12.8 oz (96.1 kg)  07/09/21 213 lb (96.6 kg)    BP Readings from Last 3 Encounters:  08/09/21 124/78  07/10/21 118/78  07/09/21 121/70     Physical Exam- Limited  Constitutional:  Body mass index is 29.41 kg/m. , not in acute distress, normal state of mind   Results for orders placed or performed in visit on 08/09/21  HgB A1c  Result Value Ref Range   Hemoglobin A1C     HbA1c POC (<> result, manual entry)     HbA1c, POC (prediabetic range)     HbA1c, POC (controlled diabetic range) 6.4 0.0 - 7.0 %   Diabetic Labs (most recent): Lab Results  Component Value Date   HGBA1C 6.4 08/09/2021   HGBA1C 8.0 (A) 05/08/2021   HGBA1C 6.5 11/16/2020      Latest Ref Rng & Units 04/26/2021    8:32 AM 11/14/2020    8:37 AM 04/24/2020    1:35 PM  CMP  Glucose 70 - 99 mg/dL 148   109   123    BUN 8 - 27 mg/dL _1 Creatinine 0.76 - 1.27 mg/dL 0.85   0.81   0.88    Sodium 134 - 144 mmol/L 142   140   144    Potassium 3.5 - 5.2 mmol/L 3.8   3.5   4.4    Chloride 96 - 106 mmol/L 102   100   101    CO2 20 - 29 mmol/L _2 Calcium 8.6 - 10.2 mg/dL 9.4   9.3   10.0    Total Protein 6.0 - 8.5 g/dL 6.4   6.5   7.1    Total Bilirubin 0.0 - 1.2 mg/dL 0.8   0.7   0.9    Alkaline Phos 44 - 121 IU/L 155   125   154    AST 0 - 40 IU/L _3 ALT 0 - 44 IU/L _4 Lipid Panel     Component Value Date/Time   CHOL 118 11/14/2020 0837   TRIG 83 11/14/2020 0837   HDL 44 11/14/2020 0837   CHOLHDL 2.7 11/14/2020 0837   CHOLHDL 2.2 01/04/2016  0745   VLDL 13 01/04/2016 0745   LDLCALC 58 11/14/2020 0837   LABVLDL 16 11/14/2020 0837     Assessment & Plan:   1) Type 2 diabetes mellitus with vascular disease ([place])  His diabetes  is complicated by CVA and patient remains at a high risk for more acute and chronic complications of diabetes which include CAD, CVA, CKD, retinopathy, and neuropathy.  Zachary Murillo presents with his logs showing significant improvement.  His point-of-care A1c is 6.4% improved from 8%.  He did not document hypoglycemia.   -He maintains 140 pounds of weight loss status post his Roux-en-Y bypass surgery.  He denies any hypoglycemia.    Recent labs reviewed. This patient continues to benefit from lifestyle medicine.   - he acknowledges that there is a room for improvement in his food and drink choices. - Suggestion is made for him to avoid simple carbohydrates  from his diet including Cakes, Sweet Desserts, Ice Cream, Soda (diet and regular), Sweet Tea, Candies, Chips, Cookies, Store Bought Juices, Alcohol , Artificial Sweeteners,  Coffee Creamer, and "Sugar-free" Products, Lemonade. This will help patient to have more stable blood glucose profile and potentially avoid unintended weight gain.  The following Lifestyle Medicine recommendations according to [facility]  [facility]) were discussed and and offered to patient and he  agrees to start the journey:  A. Whole Foods, Plant-Based Nutrition comprising of fruits and vegetables, plant-based proteins, whole-grain carbohydrates was discussed in detail with the patient.   A list for source of those nutrients were also provided to the patient.  Patient will use only water or unsweetened tea for hydration. B.  The need to stay away from risky substances including alcohol, smoking; obtaining 7 to 9 hours of restorative sleep, at least 150 minutes of moderate intensity exercise weekly, the importance of healthy social connections,  and stress management techniques were discussed. C.  A full color page of  Calorie density of various food groups per pound showing examples of each food groups was provided to the patient.    - Patient is advised  to stick to a routine mealtimes to eat 3 meals  a day and avoid unnecessary snacks ( to snack only to correct hypoglycemia).  - I have approached patient with the following individualized plan to manage diabetes and patient agrees.  - After a successful Roux-en-Y bariatric surgery and lost approximately 145 pounds, he was able to come off of multiple daily injections of insulin therapy. His presentation is consistent with controlled glycemia.  He will not need additional medications at this time. -Due to unexplained GI discomfort and loose stools, he is advised to skip Trulicity for 1 week to observe.  If there is no association between diarrhea and Trulicity, he will resume at 3 mg subcutaneously weekly.   He will be considered for additional intervention such as with low-dose metformin or basal insulin if he returns with A1c above 7% next visit.  - He is advised to continue monitoring blood glucose at least at fasting, and call clinic for blood glucose readings above 150 mg/dl 3 in a week at fasting.  - Patient specific target  for A1c; LDL, HDL, Triglycerides,  were discussed in detail.  2) BP/HTN:  -His blood pressure is controlled to target.  His current blood pressure medications include lisinopril 40 mg p.o. daily at breakfast..  3) Lipids/HPL:  His most recent lipid panel showed improved lipid profile with LDL at 58.  He is advised  to continue Lipitor 10 mg p.o. nightly.   Side effects and precautions discussed with him.   4)  Weight/Diet:  His Body mass index is 29.41 kg/m. He is status post Roux-en-Y bariatric surgery.  He is still a candidate for modest weight loss.  The above detailed lifestyle medicine will help control weight, exercise, and carbohydrates information provided.  He is approached to reengage in routine physical activity to prevent further weight gain.  5) Chronic Care/Health Maintenance: -Patient is on ACEI/ARB and Statin medications and encouraged to continue to  follow up with Ophthalmology, Podiatrist at least yearly or according to recommendations, and advised to  stay away from smoking. I have recommended yearly flu vaccine and pneumonia vaccination at least every 5 years; moderate intensity exercise for up to 150 minutes weekly; and  sleep for at least 7 hours a day.  6) Vitamin D deficiency -His most recent vitamin D level on 08/06/2018 was 51.  He is advised to continue daily maintenance dose of vitamin D3 for now.  Will recheck vitamin D level prior to next visit.   - I advised patient to maintain close follow up with his PCP and he is bariatric team for ongoing care needs.   I spent 34 minutes in the care of the patient today including review of labs from [place], Lipids, Thyroid Function, Hematology (current and previous including abstractions from other facilities); face-to-face time discussing  his blood glucose readings/logs, discussing hypoglycemia and hyperglycemia episodes and symptoms, medications doses, his options of short and long term treatment based on the latest standards of care / guidelines;  discussion about incorporating lifestyle medicine;  and documenting the encounter.    Please refer to Patient Instructions for Blood Glucose Monitoring and Insulin/Medications Dosing Guide"  in media tab for additional information. Please  also refer to " Patient Self Inventory" in the Media  tab for reviewed elements of pertinent patient history.  Zachary Murillo. participated in the discussions, expressed understanding, and voiced agreement with the above plans.  All questions were answered to his satisfaction. he is encouraged to contact clinic should he have any questions or concerns prior to his return visit.    Follow up plan: -Return in about 4 months (around 12/09/2021) for Bring Meter/CGM Device/Logs- A1c in Office, Urine MA - [place].    Rayetta Pigg, [facility] [address] [place], [place]  [address] Phone: [phone] Fax: [phone]  08/09/2021, 1:52 PM

## 2021-08-09 NOTE — Patient Instructions (Signed)

## 2021-08-14 DIAGNOSIS — R1011 Right upper quadrant pain: Secondary | ICD-10-CM | POA: Diagnosis not present

## 2021-08-14 DIAGNOSIS — R1030 Lower abdominal pain, unspecified: Secondary | ICD-10-CM | POA: Diagnosis not present

## 2021-08-14 DIAGNOSIS — K529 Noninfective gastroenteritis and colitis, unspecified: Secondary | ICD-10-CM | POA: Diagnosis not present

## 2021-08-17 ENCOUNTER — Telehealth: Payer: Self-pay | Admitting: "Endocrinology

## 2021-08-17 NOTE — Telephone Encounter (Signed)
Discussed with pt's wife, understanding voiced. 

## 2021-08-17 NOTE — Telephone Encounter (Signed)
Pt wife called in regards to patient still experiencing diarrhea.  Pt was seen 6/1 and was instructed by Dr Dorris Fetch to stop the trulicity for a week (this week) to see if the diarrhea improved. She states it has been some improvement but not much, he is still having gas and watery stool. Patient seen PCP this week and recommended he stay off of trulicity atleast another week to make sure it was out of his system and see if it got better, but she wanted to check with Dr. Dorris Fetch first. His pcp also has him scheduled next Friday for a U/S of abdomen.   Pt was also instructed to call if he had readings 3x morning in row of 150 or greater.  Pt is going out of town tomorrow and would like recommendations on what should be done today if possible. [phone]  6/7 160  6/8 163  6/9 148

## 2021-08-17 NOTE — Telephone Encounter (Signed)
Informed patient wife

## 2021-08-24 ENCOUNTER — Encounter (HOSPITAL_COMMUNITY): Payer: Self-pay

## 2021-08-24 ENCOUNTER — Ambulatory Visit (HOSPITAL_COMMUNITY)
Admission: RE | Admit: 2021-08-24 | Discharge: 2021-08-24 | Disposition: A | Discharge status: Home / Self Care | Payer: Medicare PPO | Source: Ambulatory Visit | Attending: Urology | Admitting: Urology

## 2021-08-24 DIAGNOSIS — R1011 Right upper quadrant pain: Secondary | ICD-10-CM | POA: Diagnosis not present

## 2021-08-24 DIAGNOSIS — K529 Noninfective gastroenteritis and colitis, unspecified: Secondary | ICD-10-CM | POA: Diagnosis not present

## 2021-08-24 DIAGNOSIS — R1030 Lower abdominal pain, unspecified: Secondary | ICD-10-CM | POA: Diagnosis not present

## 2021-08-24 DIAGNOSIS — R972 Elevated prostate specific antigen [PSA]: Secondary | ICD-10-CM

## 2021-08-27 ENCOUNTER — Telehealth: Payer: Self-pay | Admitting: "Endocrinology

## 2021-08-27 NOTE — Telephone Encounter (Signed)
Patient's wife called and said that she his blood sugars are still running around 150. He has been off the Trulicity for 2 weeks. He is not having as much runny diarrhea. That has definitely improved. He had an Ultrasound of his Abdomen. He is leaving for out of town today. Patient's [place]

## 2021-08-28 ENCOUNTER — Other Ambulatory Visit: Payer: Self-pay | Admitting: "Endocrinology

## 2021-08-28 MED ORDER — TRULICITY 0.75 MG/0.5ML ~~LOC~~ SOAJ: 0.7500 mg | SUBCUTANEOUS | 2 refills | Status: DC

## 2021-08-28 NOTE — Telephone Encounter (Signed)
Called patient wife and they are at the beach and will call back to let us know where it needs to be sent too.

## 2021-08-28 NOTE — Telephone Encounter (Signed)
Pt needs new Trulicity prescription sent to pharmacy below as they are on vacation.   Dulaglutide (TRULICITY) 5.92 NG/3.9EV SOPN   [id number] - [place], [address] AT [address] Phone:  [phone]  Fax:  [phone]

## 2021-08-28 NOTE — Telephone Encounter (Signed)
Pt wife called again and states the doctor called her and the ultrasound showed everything normal, no stones, or anything. Also states stool sample was normal. Requesting a call back.

## 2021-09-03 ENCOUNTER — Telehealth: Payer: Self-pay

## 2021-09-05 ENCOUNTER — Encounter: Payer: Self-pay | Admitting: Urology

## 2021-10-01 ENCOUNTER — Ambulatory Visit (HOSPITAL_COMMUNITY)
Admission: RE | Admit: 2021-10-01 | Discharge: 2021-10-01 | Disposition: A | Discharge status: Home / Self Care | Payer: Medicare PPO | Source: Ambulatory Visit | Attending: Urology | Admitting: Urology

## 2021-10-01 ENCOUNTER — Encounter (HOSPITAL_COMMUNITY): Payer: Self-pay

## 2021-10-01 NOTE — Progress Notes (Signed)
Called and left VM at Dr Loleta Dicker office in [place] to give the information to him about the unsafe loop recorder.

## 2021-10-01 NOTE — Progress Notes (Signed)
Per Zachary Rep- pt has implantable Loop recorder that was placed 12/01/2018. It is end of life and is UNSAFE to have any MRI scan. Patient will need to discuss with MD to have it removed or replaced to be able to have MRI scan-per [employer].  10/01/2021

## 2021-10-03 DIAGNOSIS — R140 Abdominal distension (gaseous): Secondary | ICD-10-CM | POA: Diagnosis not present

## 2021-10-03 DIAGNOSIS — R198 Other specified symptoms and signs involving the digestive system and abdomen: Secondary | ICD-10-CM | POA: Diagnosis not present

## 2021-10-03 DIAGNOSIS — R159 Full incontinence of feces: Secondary | ICD-10-CM | POA: Diagnosis not present

## 2021-10-04 ENCOUNTER — Other Ambulatory Visit: Payer: Self-pay | Admitting: Gastroenterology

## 2021-10-04 ENCOUNTER — Telehealth: Payer: Self-pay

## 2021-10-04 ENCOUNTER — Ambulatory Visit
Admission: RE | Admit: 2021-10-04 | Discharge: 2021-10-04 | Disposition: A | Discharge status: Home / Self Care | Payer: Medicare PPO | Source: Ambulatory Visit | Attending: Gastroenterology | Admitting: Gastroenterology

## 2021-10-04 DIAGNOSIS — R198 Other specified symptoms and signs involving the digestive system and abdomen: Secondary | ICD-10-CM

## 2021-10-04 DIAGNOSIS — K5900 Constipation, unspecified: Secondary | ICD-10-CM | POA: Diagnosis not present

## 2021-10-04 NOTE — Addendum Note (Signed)
Addended by: Dorisann Frames on: 10/04/2021 07:33 PM   Modules accepted: Orders

## 2021-10-04 NOTE — Telephone Encounter (Signed)
This is the patient that has a lupricorder implanted, it is dead and not in use anymore and his cardiologist that placed it advised he could leave it in. The manufacturer [employer] advised that since it was not active patient can not proceed with MRI since they can not run diagnostics on it before and after. The cardiologist along with the medical center that placed the lupricorder have both written letters stating it was safe to proceed with MRI, which are in patients media tab. [facility] canceled appt and advised they could not proceed since [employer] stated they needed to run analysis and could not approve it. Patients wife wanted to know if the order could be sent to [facility] diagnostic center instead. I advised that I would send a message regarding same. Patients wife advised they are meeting with the cardiologist to have it possibly removed but until then wanted to see if they could proceed with MRI.

## 2021-10-04 NOTE — Telephone Encounter (Signed)
-----   Message from Festus Aloe, MD sent at 10/04/2021 11:27 AM EDT ----- Please let [name] know since we are having trouble getting his prostate MRI scheduled, I need to see him back with a PSA prior sometime in August. Please schedule.   Thank you!

## 2021-10-04 NOTE — Telephone Encounter (Signed)
FYI patient not available 7/31-8/17 for any apt.  Lab scheduled for 08/21 and f/u with you on 09/18.

## 2021-10-04 NOTE — Telephone Encounter (Signed)
Order updated

## 2021-10-26 ENCOUNTER — Other Ambulatory Visit: Payer: Self-pay | Admitting: "Endocrinology

## 2021-10-29 ENCOUNTER — Ambulatory Visit
Admission: RE | Admit: 2021-10-29 | Discharge: 2021-10-29 | Disposition: A | Discharge status: Home / Self Care | Payer: Medicare PPO | Source: Ambulatory Visit | Attending: Urology | Admitting: Urology

## 2021-10-29 ENCOUNTER — Other Ambulatory Visit: Payer: Medicare PPO

## 2021-10-29 DIAGNOSIS — R972 Elevated prostate specific antigen [PSA]: Secondary | ICD-10-CM

## 2021-10-29 MED ORDER — GADOBENATE DIMEGLUMINE 529 MG/ML IV SOLN
19.0000 mL | Freq: Once | INTRAVENOUS | Status: AC | PRN
  Administered 2021-10-29: 19 mL via INTRAVENOUS

## 2021-10-30 LAB — PSA: Prostate Specific Ag, Serum: 7.4 ng/mL — ABNORMAL HIGH (ref 0.0–4.0)

## 2021-10-31 DIAGNOSIS — R195 Other fecal abnormalities: Secondary | ICD-10-CM | POA: Diagnosis not present

## 2021-10-31 DIAGNOSIS — M19011 Primary osteoarthritis, right shoulder: Secondary | ICD-10-CM | POA: Diagnosis not present

## 2021-11-05 ENCOUNTER — Telehealth: Payer: Self-pay | Admitting: "Endocrinology

## 2021-11-05 ENCOUNTER — Other Ambulatory Visit: Payer: Self-pay | Admitting: "Endocrinology

## 2021-11-05 MED ORDER — METFORMIN HCL 500 MG PO TABS: 500.0000 mg | ORAL_TABLET | Freq: Two times a day (BID) | ORAL | 2 refills | Status: DC

## 2021-11-05 MED ORDER — METFORMIN HCL ER (OSM) 500 MG PO TB24: 500.0000 mg | ORAL_TABLET | Freq: Every day | ORAL | 1 refills | Status: DC

## 2021-11-05 NOTE — Telephone Encounter (Signed)
Pt's wife called and said his Trulicity was decreased a couple months ago due to Diarrhea. It has been controlled but his sugar has been different he says. Please advise because patient's wife said he got upset that this morning it was 182. She said he is doing a lot of sitting,not doing a lot of walking. Do you want him to increase the Trulicity?    Fri-158 Sat-165 Sunday-158 This morning-182  Call back [phone] (wife)

## 2021-11-05 NOTE — Telephone Encounter (Signed)
Pt's wife made aware. 

## 2021-11-05 NOTE — Telephone Encounter (Signed)
Left VM on wife's phone to return call to office.

## 2021-11-05 NOTE — Telephone Encounter (Signed)
[facility] called and said that the metformin you called in is not covered by insurance. Can you call in the regular metformin?

## 2021-11-20 DIAGNOSIS — H903 Sensorineural hearing loss, bilateral: Secondary | ICD-10-CM | POA: Diagnosis not present

## 2021-11-26 ENCOUNTER — Encounter: Payer: Self-pay | Admitting: Urology

## 2021-11-26 ENCOUNTER — Ambulatory Visit: Payer: Medicare PPO | Admitting: Urology

## 2021-11-26 VITALS — BP 126/74 | HR 72

## 2021-11-26 DIAGNOSIS — N400 Enlarged prostate without lower urinary tract symptoms: Secondary | ICD-10-CM | POA: Diagnosis not present

## 2021-11-26 DIAGNOSIS — R972 Elevated prostate specific antigen [PSA]: Secondary | ICD-10-CM | POA: Diagnosis not present

## 2021-11-26 LAB — URINALYSIS, ROUTINE W REFLEX MICROSCOPIC
Bilirubin, UA: NEGATIVE
Glucose, UA: NEGATIVE
Ketones, UA: NEGATIVE
Leukocytes,UA: NEGATIVE
Nitrite, UA: NEGATIVE
Protein,UA: NEGATIVE
RBC, UA: NEGATIVE
Specific Gravity, UA: 1.005 (ref 1.005–1.030)
Urobilinogen, Ur: 0.2 mg/dL (ref 0.2–1.0)
pH, UA: 5 (ref 5.0–7.5)

## 2021-11-26 NOTE — Progress Notes (Signed)
11/26/2021 10:11 AM   Carlyon Shadow. 19-Dec-1950 [id number]  Referring provider: Johna Roles, [address],  [place] [address]  No chief complaint on file.   HPI:  F/u -    1) PSA elevation-his PSA in October 2022 was 5.1. This rose when rechecked April 2023 to 7.1.  He has no history of BPH.  AUA symptom score is 2. No prostate bx. No FH of PCa - maybe dad.    He is on Plavix - for CVA.  He has a cardiac loop. He had a shoulder MRI. He is on terazosin 10 mg.  [name] is seen today for the above. No voiding complaints. His Aug 2023 PSA was 7.4. Aug 2023 prostate MRI was benign, 83 gram prostate.    He was Higher education careers adviser at [employer].    PMH: Past Medical History:  Diagnosis Date   Acute ischemic stroke ([place]) 02/22/2013   CHF (congestive heart failure) (HCC)    CVA (cerebral infarction) 01/22/2013   Diabetes mellitus ([place])    Difficulty in walking(719.7) 01/25/2013   History of Roux-en-Y gastric bypass    Hypertension    IMPINGEMENT SYNDROME 01/16/2009   Qualifier: Diagnosis of  By: Aline Brochure MD, [place] 01/16/2009   Qualifier: Diagnosis of  By: Aline Brochure MD, [place]     Knee pain, right 09/05/2011   KNEE, ARTHRITIS, DEGEN./OSTEO 01/30/2009   Qualifier: Diagnosis of  By: Aline Brochure MD, [place]     Lack of coordination 01/22/2013   Non-ischemic cardiomyopathy ([place])    Osteoarthritis of knee    Other obesity due to excess calories 01/30/2015   Overweight(278.02)    Pulmonary hypertension ([place])    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 12/2014, Dr. Flavia Shipper   S/P lumbar fusion 07/02/2013   SHOULDER PAIN 01/16/2009   Qualifier: Diagnosis of  By: Aline Brochure MD, [place]     Shoulder pain, right    Sleep apnea    Sleep apnea    Spinal stenosis    Stroke [facility])    TENDINITIS, LEFT KNEE 01/16/2009   Qualifier: Diagnosis of  By: Aline Brochure MD, Arlana Hove D deficiency      Surgical History: Past Surgical History:  Procedure Laterality Date   BACK SURGERY     CARDIAC CATHETERIZATION     KNEE ARTHROSCOPY     loop recorder  12/01/2018   Per [facility] Rep Chris-pt is unsafe to have any MRI-Loop recorder is end of life   10/01/2021   NASAL SEPTUM SURGERY      Home Medications:  Allergies as of 11/26/2021       Reactions   Prednisone    Starts itching        Medication List        Accurate as of November 26, 2021 10:11 AM. If you have any questions, ask your nurse or doctor.          Accu-Chek Guide Me w/Device Kit 1 Piece by Does not apply route as directed.   Accu-Chek Guide test strip Generic drug: glucose blood USE TO TEST BLOOD SUGAR ONCE DAILY. E11.65   atorvastatin 10 MG tablet Commonly known as: LIPITOR TAKE (1) TABLET BY MOUTH AT BEDTIME.   CALCIUM + D PO Take by mouth 2 (two) times daily.   CENTRUM PO Take 1 tablet by mouth every morning.   cetirizine 10  MG tablet Commonly known as: ZYRTEC Take 10 mg by mouth daily as needed for allergies.   clopidogrel 75 MG tablet Commonly known as: PLAVIX Take 1 tablet (75 mg total) by mouth daily.   docusate sodium 100 MG capsule Commonly known as: COLACE Take 100 mg by mouth 2 (two) times daily.   fluticasone 50 MCG/ACT nasal spray Commonly known as: FLONASE Place into the nose.   hydrochlorothiazide 12.5 MG tablet Commonly known as: HYDRODIURIL Take 12.5 mg by mouth daily.   lisinopril 40 MG tablet Commonly known as: ZESTRIL Take 40 mg by mouth every morning.   metFORMIN 500 MG tablet Commonly known as: GLUCOPHAGE Take 1 tablet (500 mg total) by mouth 2 (two) times daily with a meal.   sertraline 50 MG tablet Commonly known as: ZOLOFT Take 1 tablet (50 mg total) by mouth daily.   sertraline 100 MG tablet Commonly known as: ZOLOFT Take 150 mg by mouth daily.   Trulicity 9.62 XB/2.8UX Sopn Generic drug: Dulaglutide Inject 0.75 mg into the skin once a  week.   vitamin C 1000 MG tablet Take 1,000 mg by mouth daily.   Vitamin D 50 MCG (2000 UT) Caps Take by mouth.        Allergies:  Allergies  Allergen Reactions   Prednisone     Starts itching    Family History: Family History  Problem Relation Age of Onset   Heart disease Other    Arthritis Other    Cancer Other    Asthma Other    Diabetes Other    Breast cancer Mother    Heart disease Mother    Heart disease Father    Stroke Father    Cancer Sister    Heart disease Brother    COPD Sister     Social History:  reports that he has never smoked. He has never used smokeless tobacco. He reports current alcohol use. He reports that he does not use drugs.   Physical Exam: BP 126/74   Pulse 72   Constitutional:  Alert and oriented, No acute distress. HEENT: [place] AT, moist mucus membranes.  Trachea midline, no masses. Cardiovascular: No clubbing, cyanosis, or edema. Respiratory: Normal respiratory effort, no increased work of breathing. GI: Abdomen is soft, nontender, nondistended, no abdominal masses GU: No CVA tenderness Skin: No rashes, bruises or suspicious lesions. Neurologic: Grossly intact, no focal deficits, moving all 4 extremities. Psychiatric: Normal mood and affect.  Laboratory Data: Lab Results  Component Value Date   WBC 10.5 06/07/2018   HGB 15.6 06/07/2018   HCT 46.2 06/07/2018   MCV 89.2 06/07/2018   PLT 288 06/07/2018    Lab Results  Component Value Date   CREATININE 0.85 04/26/2021    No results found for: "PSA"  No results found for: "TESTOSTERONE"  Lab Results  Component Value Date   HGBA1C 6.4 08/09/2021    Urinalysis    Component Value Date/Time   APPEARANCEUR Clear 07/09/2021 1500   GLUCOSEU Negative 07/09/2021 1500   BILIRUBINUR Negative 07/09/2021 1500   PROTEINUR Negative 07/09/2021 1500   NITRITE Negative 07/09/2021 1500   LEUKOCYTESUR Negative 07/09/2021 1500    Lab Results  Component Value Date   LABMICR  Comment 07/09/2021    Pertinent Imaging: Prostate MRI - normal -  reviewed images    Results for orders placed during the hospital encounter of 10/04/21  DG Abd 1 View  Narrative CLINICAL DATA:  Alternating constipation and diarrhea for several months.  EXAM: ABDOMEN -  1 VIEW  COMPARISON:  June 07, 2019.  FINDINGS: The bowel gas pattern is normal. No significant stool burden is noted. No radio-opaque calculi or other significant radiographic abnormality are seen.  IMPRESSION: No abnormal bowel dilatation is noted.   Electronically Signed By: Marijo Conception M.D. On: 10/05/2021 12:40    Assessment & Plan:    1. 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 His PSAD and p MRI are normal. Disc other lab tests (exoDx or isoPSA0, bx and surv. Pt favors surv. Will recheck PSA in 3 mo and consider bx if PSAV or PSAD increases significantly.  - Urinalysis, Routine w reflex microscopic  2.  BPH - disc surv, meds, procedures.   No follow-ups on file.  Festus Aloe, MD  [facility]  [address] [place], [place] [address] [phone]

## 2021-11-28 DIAGNOSIS — H35362 Drusen (degenerative) of macula, left eye: Secondary | ICD-10-CM | POA: Diagnosis not present

## 2021-11-28 DIAGNOSIS — H318 Other specified disorders of choroid: Secondary | ICD-10-CM | POA: Diagnosis not present

## 2021-11-28 DIAGNOSIS — H43812 Vitreous degeneration, left eye: Secondary | ICD-10-CM | POA: Diagnosis not present

## 2021-11-28 DIAGNOSIS — Z961 Presence of intraocular lens: Secondary | ICD-10-CM | POA: Diagnosis not present

## 2021-12-10 ENCOUNTER — Ambulatory Visit: Payer: Medicare PPO | Admitting: "Endocrinology

## 2021-12-17 DIAGNOSIS — E1159 Type 2 diabetes mellitus with other circulatory complications: Secondary | ICD-10-CM | POA: Diagnosis not present

## 2021-12-18 LAB — LIPID PANEL
Chol/HDL Ratio: 2.2 ratio (ref 0.0–5.0)
Cholesterol, Total: 103 mg/dL (ref 100–199)
HDL: 46 mg/dL (ref >39)
LDL Chol Calc (NIH): 44 mg/dL (ref 0–99)
Triglycerides: 59 mg/dL (ref 0–149)
VLDL Cholesterol Cal: 13 mg/dL (ref 5–40)

## 2021-12-18 LAB — COMPREHENSIVE METABOLIC PANEL
ALT: 28 IU/L (ref 0–44)
AST: 22 IU/L (ref 0–40)
Albumin/Globulin Ratio: 1.7 (ref 1.2–2.2)
Albumin: 4.4 g/dL (ref 3.8–4.8)
Alkaline Phosphatase: 154 IU/L — ABNORMAL HIGH (ref 44–121)
BUN/Creatinine Ratio: 16 (ref 10–24)
BUN: 13 mg/dL (ref 8–27)
Bilirubin Total: 1.1 mg/dL (ref 0.0–1.2)
CO2: 22 mmol/L (ref 20–29)
Calcium: 9.4 mg/dL (ref 8.6–10.2)
Chloride: 104 mmol/L (ref 96–106)
Creatinine, Ser: 0.82 mg/dL (ref 0.76–1.27)
Globulin, Total: 2.6 g/dL (ref 1.5–4.5)
Glucose: 144 mg/dL — ABNORMAL HIGH (ref 70–99)
Potassium: 3.3 mmol/L — ABNORMAL LOW (ref 3.5–5.2)
Sodium: 146 mmol/L — ABNORMAL HIGH (ref 134–144)
Total Protein: 7 g/dL (ref 6.0–8.5)
eGFR: 94 mL/min/{1.73_m2} (ref >59)

## 2021-12-20 ENCOUNTER — Encounter: Payer: Self-pay | Admitting: "Endocrinology

## 2021-12-20 ENCOUNTER — Ambulatory Visit: Payer: Medicare PPO | Admitting: "Endocrinology

## 2021-12-20 VITALS — BP 138/88 | HR 76 | Ht 70.0 in | Wt 201.8 lb

## 2021-12-20 DIAGNOSIS — E1159 Type 2 diabetes mellitus with other circulatory complications: Secondary | ICD-10-CM | POA: Diagnosis not present

## 2021-12-20 DIAGNOSIS — E876 Hypokalemia: Secondary | ICD-10-CM | POA: Diagnosis not present

## 2021-12-20 DIAGNOSIS — E782 Mixed hyperlipidemia: Secondary | ICD-10-CM | POA: Diagnosis not present

## 2021-12-20 DIAGNOSIS — I10 Essential (primary) hypertension: Secondary | ICD-10-CM | POA: Diagnosis not present

## 2021-12-20 DIAGNOSIS — E559 Vitamin D deficiency, unspecified: Secondary | ICD-10-CM

## 2021-12-20 LAB — POCT GLYCOSYLATED HEMOGLOBIN (HGB A1C): HbA1c, POC (controlled diabetic range): 7.1 % — AB (ref 0.0–7.0)

## 2021-12-20 MED ORDER — POTASSIUM CHLORIDE CRYS ER 20 MEQ PO TBCR: 20.0000 meq | EXTENDED_RELEASE_TABLET | Freq: Every day | ORAL | 1 refills | Status: DC

## 2021-12-20 NOTE — Progress Notes (Signed)
12/20/2021  Endocrinology follow-up note   Subjective:    Patient ID: Zachary Murillo., male    DOB: 1951-01-23, PCP Johna Roles, PA   Past Medical History:  Diagnosis Date   Acute ischemic stroke ([place]) 02/22/2013   CHF (congestive heart failure) ([place])    CVA (cerebral infarction) 01/22/2013   Diabetes mellitus ([place])    Difficulty in walking(719.7) 01/25/2013   History of Roux-en-Y gastric bypass    Hypertension    IMPINGEMENT SYNDROME 01/16/2009   Qualifier: Diagnosis of  By: Aline Brochure MD, [place] 01/16/2009   Qualifier: Diagnosis of  By: Aline Brochure MD, [place]     Knee pain, right 09/05/2011   KNEE, ARTHRITIS, DEGEN./OSTEO 01/30/2009   Qualifier: Diagnosis of  By: Aline Brochure MD, [place]     Lack of coordination 01/22/2013   Non-ischemic cardiomyopathy ([place])    Osteoarthritis of knee    Other obesity due to excess calories 01/30/2015   Overweight(278.02)    Pulmonary hypertension ([place])    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 12/2014, Dr. Flavia Shipper   S/P lumbar fusion 07/02/2013   SHOULDER PAIN 01/16/2009   Qualifier: Diagnosis of  By: Aline Brochure MD, [place]     Shoulder pain, right    Sleep apnea    Sleep apnea    Spinal stenosis    Stroke [facility])    TENDINITIS, LEFT KNEE 01/16/2009   Qualifier: Diagnosis of  By: Aline Brochure MD, Arlana Hove D deficiency    Past Surgical History:  Procedure Laterality Date   BACK SURGERY     CARDIAC CATHETERIZATION     KNEE ARTHROSCOPY     loop recorder  12/01/2018   Per [facility] Rep Chris-pt is unsafe to have any MRI-Loop recorder is end of life   10/01/2021   NASAL SEPTUM SURGERY     Social History   Socioeconomic History   Marital status: Married    Spouse name: Otila Kluver    Number of children: 1   Years of education: Masters   Highest education level: Not on file  Occupational History   Occupation: Retired-Drivers Ed   Tobacco Use   Smoking status:  Never   Smokeless tobacco: Never  Vaping Use   Vaping Use: Never used  Substance and Sexual Activity   Alcohol use: Yes    Comment: occ   Drug use: No   Sexual activity: Yes    Birth control/protection: Surgical  Other Topics Concern   Not on file  Social History Narrative   Lives with wife-42 years   One daughter who lives in [place] grandchildren, one expecting      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, Warehouse manager racing, fishing-Cubs and Curator      Diet: eats all food groups   Caffeine: 2 cups of coffee daily, tea every other day   Water: 2-3 cups daily      Wears seat belt   Does not use phone while driving    Oceanographer             Social Determinants of Health   Financial Resource Strain: Not on file  Food Insecurity: Not on file  Transportation Needs: Not on file  Physical Activity: Not on file  Stress: Not on file  Social Connections: Not on file   Outpatient Encounter Medications as of 12/20/2021  Medication Sig   potassium chloride SA (KLOR-CON  M) 20 MEQ tablet Take 1 tablet (20 mEq total) by mouth daily.   ascorbic acid (VITAMIN C) 500 MG tablet Take 500 mg by mouth daily.   atorvastatin (LIPITOR) 10 MG tablet TAKE (1) TABLET BY MOUTH AT BEDTIME.   Blood Glucose Monitoring Suppl (ACCU-CHEK GUIDE ME) w/Device KIT 1 Piece by Does not apply route as directed.   Calcium Citrate-Vitamin D (CALCIUM + D PO) Take 600 mg by mouth 2 (two) times daily.   cetirizine (ZYRTEC) 10 MG tablet Take 10 mg by mouth daily as needed for allergies.    Cholecalciferol (VITAMIN D) 125 MCG (5000 UT) CAPS Take 5,000 Units by mouth daily.   clopidogrel (PLAVIX) 75 MG tablet Take 1 tablet (75 mg total) by mouth daily.   Dulaglutide (TRULICITY) 1.28 NO/6.7EH SOPN Inject 0.75 mg into the skin once a week.   fluticasone (FLONASE) 50 MCG/ACT nasal spray Place 2 sprays into both nostrils daily as needed for allergies.   glucose blood (ACCU-CHEK GUIDE) test strip USE TO  TEST BLOOD SUGAR ONCE DAILY. E11.65   hydrochlorothiazide (HYDRODIURIL) 12.5 MG tablet Take 12.5 mg by mouth daily.   lisinopril (PRINIVIL,ZESTRIL) 40 MG tablet Take 40 mg by mouth every morning.   metFORMIN (GLUCOPHAGE) 500 MG tablet Take 1 tablet (500 mg total) by mouth 2 (two) times daily with a meal.   METHYLCELLULOSE, LAXATIVE, PO Take 1 packet by mouth daily. 15 ml   Multiple Vitamins-Minerals (CENTRUM PO) Take 1 tablet by mouth every morning. [place] chewable with extra iron   Probiotic Product (ALIGN) 4 MG CAPS Take 4 mg by mouth daily at 2 am.   sertraline (ZOLOFT) 50 MG tablet Take 1 tablet (50 mg total) by mouth daily. (Patient taking differently: Take 150 mg by mouth daily.)   zinc gluconate 50 MG tablet Take 50 mg by mouth daily.   No facility-administered encounter medications on file as of 12/20/2021.   ALLERGIES: Allergies  Allergen Reactions   Prednisone     Starts itching   VACCINATION STATUS: Immunization History  Administered Date(s) Administered   Influenza, High Dose Seasonal PF 11/29/2016, 01/20/2018   Influenza, Seasonal, Injecte, Preservative Fre 12/27/2012   Influenza,inj,Quad PF,6+ Mos 01/06/2015   Influenza-Unspecified 12/23/2013, 12/25/2015, 11/19/2018   PFIZER(Purple Top)SARS-COV-2 Vaccination 04/03/2019, 04/24/2019, 12/30/2019   Zoster, Live 04/25/2014    Diabetes He presents for his follow-up diabetic visit. He has type 2 diabetes mellitus. Onset time: He was diagnosed at approximate age of 22 years. His disease course has been fluctuating. There are no hypoglycemic associated symptoms. Pertinent negatives for hypoglycemia include no confusion, headaches, pallor or seizures. Pertinent negatives for diabetes include no chest pain, no fatigue, no polydipsia, no polyphagia, no polyuria and no weakness. There are no hypoglycemic complications. Symptoms are improving. Diabetic complications include a CVA and heart disease. Risk factors for coronary artery  disease include diabetes mellitus, dyslipidemia, hypertension, male sex, obesity and sedentary lifestyle. Current diabetic treatment includes oral agent (monotherapy) (Trulicity 1.5 weekly). He is compliant with treatment all of the time. His weight is decreasing steadily. He has had a previous visit with a dietitian. He participates in exercise intermittently. His home blood glucose trend is increasing steadily. His breakfast blood glucose range is generally 130-140 mg/dl. His bedtime blood glucose range is generally 140-180 mg/dl. His overall blood glucose range is 140-180 mg/dl. [name] presents with his logs showing near target glycemic profile.  His point-of-care A1c is 7.1%, increasing from 6.5%.  He did not tolerate 3 mg of Trulicity,  currently on 0.75 mg subcutaneously weekly along with low-dose metformin.   He did not document hypoglycemia.  ) An ACE inhibitor/angiotensin II receptor blocker is being taken. He sees a podiatrist.Eye exam is current.  Hyperlipidemia This is a chronic problem. The current episode started more than 1 year ago. The problem is controlled. Recent lipid tests were reviewed and are normal. Exacerbating diseases include chronic renal disease, diabetes and obesity. Pertinent negatives include no chest pain, myalgias or shortness of breath. Current antihyperlipidemic treatment includes statins. The current treatment provides moderate improvement of lipids. Compliance problems include adherence to exercise.  Risk factors for coronary artery disease include diabetes mellitus, dyslipidemia, hypertension, male sex, a sedentary lifestyle, obesity and family history.  Hypertension This is a chronic problem. The current episode started more than 1 year ago. The problem has been gradually improving since onset. The problem is controlled. Pertinent negatives include no chest pain, headaches, neck pain, palpitations or shortness of breath. There are no associated agents to hypertension. Risk  factors for coronary artery disease include diabetes mellitus, dyslipidemia, male gender, obesity and sedentary lifestyle. Past treatments include ACE inhibitors, diuretics and calcium channel blockers. The current treatment provides moderate improvement. Compliance problems include exercise and diet.  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 Identifiable causes of hypertension include chronic renal disease and hyperaldosteronism.    Review of systems  Constitutional: + Lost 4 pounds since last visit, current Body mass index is 28.96 kg/m. , no fatigue, no subjective hyperthermia, no subjective hypothermia    Objective:    BP 138/88   Pulse 76   Ht _0  (1.778 m)   Wt 201 lb 12.8 oz (91.5 kg)   BMI 28.96 kg/m   Wt Readings from Last 3 Encounters:  12/20/21 201 lb 12.8 oz (91.5 kg)  08/09/21 205 lb (93 kg)  07/10/21 211 lb 12.8 oz (96.1 kg)    BP Readings from Last 3 Encounters:  12/20/21 138/88  11/26/21 126/74  08/09/21 124/78     Physical Exam- Limited  Constitutional:  Body mass index is 28.96 kg/m. , not in acute distress, normal state of mind   Results for orders placed or performed in visit on 12/20/21  HgB A1c  Result Value Ref Range   Hemoglobin A1C     HbA1c POC (<> result, manual entry)     HbA1c, POC (prediabetic range)     HbA1c, POC (controlled diabetic range) 7.1 (A) 0.0 - 7.0 %   Diabetic Labs (most recent): Lab Results  Component Value Date   HGBA1C 7.1 (A) 12/20/2021   HGBA1C 6.4 08/09/2021   HGBA1C 8.0 (A) 05/08/2021      Latest Ref Rng & Units 12/17/2021    8:44 AM 04/26/2021    8:32 AM 11/14/2020    8:37 AM  CMP  Glucose 70 - 99 mg/dL 144  148  109   BUN 8 - 27 mg/dL _1 Creatinine 0.76 - 1.27 mg/dL 0.82  0.85  0.81   Sodium 134 - 144 mmol/L 146  142  140   Potassium 3.5 - 5.2 mmol/L 3.3  3.8  3.5   Chloride 96 - 106 mmol/L 104  102  100   CO2 20 - 29 mmol/L _2 Calcium 8.6 - 10.2 mg/dL 9.4  9.4  9.3    Total Protein 6.0 - 8.5 g/dL 7.0  6.4  6.5   Total Bilirubin 0.0 - 1.2 mg/dL  1.1  0.8  0.7   Alkaline Phos 44 - 121 IU/L 154  155  125   AST 0 - 40 IU/L _0 ALT 0 - 44 IU/L _1 Lipid Panel     Component Value Date/Time   CHOL 103 12/17/2021 0844   TRIG 59 12/17/2021 0844   HDL 46 12/17/2021 0844   CHOLHDL 2.2 12/17/2021 0844   CHOLHDL 2.2 01/04/2016 0745   VLDL 13 01/04/2016 0745   LDLCALC 44 12/17/2021 0844   LABVLDL 13 12/17/2021 0844     Assessment & Plan:   1) Type 2 diabetes mellitus with vascular disease ([place])  His diabetes is complicated by CVA and patient remains at a high risk for more acute and chronic complications of diabetes which include CAD, CVA, CKD, retinopathy, and neuropathy. [name] presents with his logs showing near target glycemic profile.  His point-of-care A1c is 7.1%, increasing from 6.5%.  He did not tolerate 3 mg of Trulicity, currently on 0.75 mg subcutaneously weekly along with low-dose metformin.   He did not document hypoglycemia.   -He maintains 140 pounds of weight loss status post his Roux-en-Y bypass surgery.  He denies any hypoglycemia.    Recent labs reviewed. This patient continues to benefit from lifestyle medicine.   - he acknowledges that there is a room for improvement in his food and drink choices. - Suggestion is made for him to avoid simple carbohydrates  from his diet including Cakes, Sweet Desserts, Ice Cream, Soda (diet and regular), Sweet Tea, Candies, Chips, Cookies, Store Bought Juices, Alcohol , Artificial Sweeteners,  Coffee Creamer, and "Sugar-free" Products, Lemonade. This will help patient to have more stable blood glucose profile and potentially avoid unintended weight gain.  The following Lifestyle Medicine recommendations according to [place]  [facility]) were discussed and and offered to patient and he  agrees to start the journey:  A. Whole Foods, Plant-Based Nutrition  comprising of fruits and vegetables, plant-based proteins, whole-grain carbohydrates was discussed in detail with the patient.   A list for source of those nutrients were also provided to the patient.  Patient will use only water or unsweetened tea for hydration. B.  The need to stay away from risky substances including alcohol, smoking; obtaining 7 to 9 hours of restorative sleep, at least 150 minutes of moderate intensity exercise weekly, the importance of healthy social connections,  and stress management techniques were discussed. C.  A full color page of  Calorie density of various food groups per pound showing examples of each food groups was provided to the patient.   - Patient is advised to stick to a routine mealtimes to eat 3 meals  a day and avoid unnecessary snacks ( to snack only to correct hypoglycemia).  - I have approached patient with the following individualized plan to manage diabetes and patient agrees.  - After a successful Roux-en-Y bariatric surgery and lost approximately 145 pounds, he was able to come off of multiple daily injections of insulin therapy. His presentation is consistent with controlled glycemia.  He will not need additional medications at this time. -He is tolerating the lower dose of Trulicity.  He is advised to continue Trulicity 7.07 mg subcutaneously weekly, metformin 500 mg p.o. twice daily.   - He is advised to continue monitoring blood glucose at least at fasting, and call clinic for blood glucose readings above 150 mg/dl 3 in a  week at fasting.  - Patient specific target  for A1c; LDL, HDL, Triglycerides,  were discussed in detail.  2) BP/HTN:  -His blood pressure is controlled to target.  His current blood pressure medications include lisinopril 40 mg p.o. daily at breakfast..  3) Lipids/HPL:  His most recent lipid panel showed improved lipid profile with LDL at 58.  He is advised to continue Lipitor 10 mg p.o. nightly.   Side effects and  precautions discussed with him.   4)  Weight/Diet:  His Body mass index is 28.96 kg/m. He is status post Roux-en-Y bariatric surgery.  He is still a candidate for modest weight loss.  The above detailed lifestyle medicine will help control weight, exercise, and carbohydrates information provided.  He is approached to reengage in routine physical activity to prevent further weight gain.  5) Chronic Care/Health Maintenance: -Patient is on ACEI/ARB and Statin medications and encouraged to continue to follow up with Ophthalmology, Podiatrist at least yearly or according to recommendations, and advised to  stay away from smoking. I have recommended yearly flu vaccine and pneumonia vaccination at least every 5 years; moderate intensity exercise for up to 150 minutes weekly; and  sleep for at least 7 hours a day.  6) Vitamin D deficiency -His most recent vitamin D level on 08/06/2018 was 51.  He is advised to continue daily maintenance dose of vitamin D3 for now.  Will recheck vitamin D level prior to next visit.  7) hypokalemia: He will benefit from low-dose potassium supplement.  I discussed and prescribed potassium chloride 20 mEq p.o. daily.   - I advised patient to maintain close follow up with his PCP and he is bariatric team for ongoing care needs.    I spent 31 minutes in the care of the patient today including review of labs from [place], Lipids, Thyroid Function, Hematology (current and previous including abstractions from other facilities); face-to-face time discussing  his blood glucose readings/logs, discussing hypoglycemia and hyperglycemia episodes and symptoms, medications doses, his options of short and long term treatment based on the latest standards of care / guidelines;  discussion about incorporating lifestyle medicine;  and documenting the encounter. Risk reduction counseling performed per USPSTF guidelines to reduce obesity and cardiovascular risk factors.     Please refer to Patient  Instructions for Blood Glucose Monitoring and Insulin/Medications Dosing Guide"  in media tab for additional information. Please  also refer to " Patient Self Inventory" in the Media  tab for reviewed elements of pertinent patient history.  Zachary Murillo. participated in the discussions, expressed understanding, and voiced agreement with the above plans.  All questions were answered to his satisfaction. he is encouraged to contact clinic should he have any questions or concerns prior to his return visit.     Follow up plan: -Return in about 6 months (around 06/21/2022) for Bring Meter/CGM Device/Logs- A1c in Office.    Rayetta Pigg, [facility] [address] [place], [place] [address] Phone: [phone] Fax: [phone]  12/20/2021, 10:00 AM

## 2021-12-20 NOTE — Patient Instructions (Addendum)
SURGICAL WAITING ROOM VISITATION Patients having surgery or a procedure may have no more than 2 support people in the waiting area - these visitors may rotate.   Children under the age of 72 must have an adult with them who is not the patient. If the patient needs to stay at the hospital during part of their recovery, the visitor guidelines for inpatient rooms apply. Pre-op nurse will coordinate an appropriate time for 1 support person to accompany patient in pre-op.  This support person may not rotate.    Please refer to the [facility] website for the visitor guidelines for Inpatients (after your surgery is over and you are in a regular room).       Your procedure is scheduled on:  01/03/22    Report to [facility] Main Entrance    Report to admitting at    1230 pm    Call this number if you have problems the morning of surgery [phone]   Do not eat food :After Midnight.   After Midnight you may have the following liquids until ___ 1200 noon  DAY OF SURGERY  Water Non-Citrus Juices (without pulp, NO RED) Carbonated Beverages Black Coffee (NO MILK/CREAM OR CREAMERS, sugar ok)  Clear Tea (NO MILK/CREAM OR CREAMERS, sugar ok) regular and decaf                             Plain Jell-O (NO RED)                                           Fruit ices (not with fruit pulp, NO RED)                                     Popsicles (NO RED)                                                               Sports drinks like Gatorade (NO RED)                    The day of surgery:  Drink ONE (1) Pre-Surgery Clear Ensure or G2 at  1200 noon ( have completed by )   the morning of surgery. Drink in one sitting. Do not sip.  This drink was given to you during your hospital  pre-op appointment visit. Nothing else to drink after completing the  Pre-Surgery Clear Ensure or G2.          If you have questions, please contact your surgeon's office.       Oral Hygiene is also important  to reduce your risk of infection.                                    Remember - BRUSH YOUR TEETH THE MORNING OF SURGERY WITH YOUR REGULAR TOOTHPASTE   Do NOT smoke after Midnight   Take these medicines the morning of surgery with A SIP OF WATER:  zyrtec if needed, zoloft   Hold Trulicity for 7 days prior to surgery   DO NOT TAKE ANY ORAL DIABETIC MEDICATIONS DAY OF YOUR SURGERY  Bring CPAP mask and tubing day of surgery.                              You may not have any metal on your body including hair pins, jewelry, and body piercing             Do not wear make-up, lotions, powders, perfumes/cologne, or deodorant  Do not wear nail polish including gel and S&S, artificial/acrylic nails, or any other type of covering on natural nails including finger and toenails. If you have artificial nails, gel coating, etc. that needs to be removed by a nail salon please have this removed prior to surgery or surgery may need to be canceled/ delayed if the surgeon/ anesthesia feels like they are unable to be safely monitored.   Do not shave  48 hours prior to surgery.               Men may shave face and neck.   Do not bring valuables to the hospital. [place].   Contacts, dentures or bridgework may not be worn into surgery.   Bring small overnight bag day of surgery.   DO NOT [place]. PHARMACY WILL DISPENSE MEDICATIONS LISTED ON YOUR MEDICATION LIST TO YOU DURING YOUR ADMISSION [place]!    Patients discharged on the day of surgery will not be allowed to drive home.  Someone NEEDS to stay with you for the first 24 hours after anesthesia.   Special Instructions: Bring a copy of your healthcare power of attorney and living will documents the day of surgery if you haven't scanned them before.              Please read over the following fact sheets you were given: IF [place] [phone]   If you received a COVID test during your pre-op visit  it is requested that you wear a mask when out in public, stay away from anyone that may not be feeling well and notify your surgeon if you develop symptoms. If you test positive for Covid or have been in contact with anyone that has tested positive in the last 10 days please notify you surgeon.     [place] - Preparing for Surgery Before surgery, you can play an important role.  Because skin is not sterile, your skin needs to be as free of germs as possible.  You can reduce the number of germs on your skin by washing with CHG (chlorahexidine gluconate) soap before surgery.  CHG is an antiseptic cleaner which kills germs and bonds with the skin to continue killing germs even after washing. Please DO NOT use if you have an allergy to CHG or antibacterial soaps.  If your skin becomes reddened/irritated stop using the CHG and inform your nurse when you arrive at Short Stay. Do not shave (including legs and underarms) for at least 48 hours prior to the first CHG shower.  You may shave your face/neck. Please follow these instructions carefully:  1.  Shower with CHG Soap the night before surgery and the  morning of Surgery.  2.  If you choose to wash your hair, wash your hair first as usual with your  normal  shampoo.  3.  After you shampoo, rinse your hair and body thoroughly to remove the  shampoo.                           4.  Use CHG as you would any other liquid soap.  You can apply chg directly  to the skin and wash                       Gently with a scrungie or clean washcloth.  5.  Apply the CHG Soap to your body ONLY FROM THE NECK DOWN.   Do not use on face/ open                           Wound or open sores. Avoid contact with eyes, ears mouth and genitals (private parts).                       Wash face,  Genitals (private parts) with your normal soap.             6.  Wash thoroughly, paying  special attention to the area where your surgery  will be performed.  7.  Thoroughly rinse your body with warm water from the neck down.  8.  DO NOT shower/wash with your normal soap after using and rinsing off  the CHG Soap.                9.  Pat yourself dry with a clean towel.            10.  Wear clean pajamas.            11.  Place clean sheets on your bed the night of your first shower and do not  sleep with pets. Day of Surgery : Do not apply any lotions/deodorants the morning of surgery.  Please wear clean clothes to the hospital/surgery center.  FAILURE TO FOLLOW THESE INSTRUCTIONS MAY RESULT IN THE CANCELLATION OF YOUR SURGERY PATIENT SIGNATURE_________________________________  NURSE SIGNATURE__________________________________  ________________________________________________________________________ [facility]- Preparing for Total Shoulder Arthroplasty    Before surgery, you can play an important role. Because skin is not sterile, your skin needs to be as free of germs as possible. You can reduce the number of germs on your skin by using the following products. Benzoyl Peroxide Gel Reduces the number of germs present on the skin Applied twice a day to shoulder area starting two days before surgery    ==================================================================  Please follow these instructions carefully:  BENZOYL PEROXIDE 5% GEL  Please do not use if you have an allergy to benzoyl peroxide.   If your skin becomes reddened/irritated stop using the benzoyl peroxide.  Starting two days before surgery, apply as follows: Apply benzoyl peroxide in the morning and at night. Apply after taking a shower. If you are not taking a shower clean entire shoulder front, back, and side along with the armpit with a clean wet washcloth.  Place a quarter-sized dollop on your shoulder and rub in thoroughly, making sure to cover the front, back, and side of your shoulder, along with the  armpit.   2 days before ____ AM   ____ PM  1 day before ____ AM   ____ PM                         Do this twice a day for two days.  (Last application is the night before surgery, AFTER using the CHG soap as described below).  Do NOT apply benzoyl peroxide gel on the day of surgery.

## 2021-12-20 NOTE — Progress Notes (Addendum)
Anesthesia Review:  PCP: Marcia Brash PA- clearance dated 12/11/21 on chart  : with info in regards to Plavix and surgery  Cardiology- [place] 07/12/21  Called and requested clearance from [place] at [place].   DR Ernesta Amble clearance dated 07/12/21 on chart  Chest x-ray : EKG : 12/25/21  Echo : Stress test: Cardiac Cath :  Activity level: can do a flight of stairs without difficutly  Sleep Study/ CPAP : has cpap  Fasting Blood Sugar :      / Checks Blood Sugar -- times a day:   Blood Thinner/ Instructions /Last Dose: ASA / Instructions/ Last Dose :   Plavix - to hold for 7 days prior to surgery per wife  DM- type2- checks glucose once daily in am  Hgba1c- 12/25/21 - 7.3 routed to DR Supple on 12/25/21.  12/17/21- CMP in epic  Wife present at pt with preop.  Blood pressure in left arm was 165.97 and in right arm was 168/103.  PT denies any chest pain, shortness of breath, blurred vision or headache.  Figure out at preop that pt took am meds last nite for blood pressure and not this am.  Wife was made aware at preop.  Wife to notify Dr Tobe Sos( card) office of blood pressure readings at preop.  PT has appt with PCP for annual visit on 12/27/21.  Discussed with pt and iwfe the importance of taking meds accurately and perhaps obtaining a pill dispenseer for pt .  Shawn Stall made aware of blood pressure readings and ekg.  NO new orders given.  Wfe to contact surgery scheduler in regards to what supplements pt is to stop prior to surgery   Has hearing aids.

## 2021-12-20 NOTE — Patient Instructions (Signed)

## 2021-12-25 ENCOUNTER — Encounter (HOSPITAL_COMMUNITY): Payer: Self-pay

## 2021-12-25 ENCOUNTER — Other Ambulatory Visit: Payer: Self-pay

## 2021-12-25 ENCOUNTER — Encounter (HOSPITAL_COMMUNITY)
Admission: RE | Admit: 2021-12-25 | Discharge: 2021-12-25 | Disposition: A | Discharge status: Home / Self Care | Payer: Medicare PPO | Source: Ambulatory Visit | Attending: Orthopedic Surgery | Admitting: Orthopedic Surgery

## 2021-12-25 VITALS — BP 165/97 | HR 67 | Temp 98.9°F | Resp 16 | Ht 70.0 in | Wt 205.0 lb

## 2021-12-25 DIAGNOSIS — Z01818 Encounter for other preprocedural examination: Secondary | ICD-10-CM | POA: Diagnosis not present

## 2021-12-25 LAB — CBC
HCT: 46.4 % (ref 39.0–52.0)
Hemoglobin: 14.7 g/dL (ref 13.0–17.0)
MCH: 27.5 pg (ref 26.0–34.0)
MCHC: 31.7 g/dL (ref 30.0–36.0)
MCV: 86.9 fL (ref 80.0–100.0)
Platelets: 228 K/uL (ref 150–400)
RBC: 5.34 MIL/uL (ref 4.22–5.81)
RDW: 15.9 % — ABNORMAL HIGH (ref 11.5–15.5)
WBC: 8.6 K/uL (ref 4.0–10.5)
nRBC: 0 % (ref 0.0–0.2)

## 2021-12-25 LAB — BASIC METABOLIC PANEL WITH GFR
Anion gap: 8 (ref 5–15)
BUN: 20 mg/dL (ref 8–23)
CO2: 29 mmol/L (ref 22–32)
Calcium: 9.4 mg/dL (ref 8.9–10.3)
Chloride: 102 mmol/L (ref 98–111)
Creatinine, Ser: 0.84 mg/dL (ref 0.61–1.24)
GFR, Estimated: >60 mL/min
Glucose, Bld: 147 mg/dL — ABNORMAL HIGH (ref 70–99)
Potassium: 3.8 mmol/L (ref 3.5–5.1)
Sodium: 139 mmol/L (ref 135–145)

## 2021-12-25 LAB — HEMOGLOBIN A1C
Hgb A1c MFr Bld: 7.3 % — ABNORMAL HIGH (ref 4.8–5.6)
Mean Plasma Glucose: 162.81 mg/dL

## 2021-12-25 LAB — SURGICAL PCR SCREEN
MRSA, PCR: NEGATIVE
Staphylococcus aureus: NEGATIVE

## 2021-12-25 LAB — GLUCOSE, CAPILLARY: Glucose-Capillary: 138 mg/dL — ABNORMAL HIGH (ref 70–99)

## 2021-12-26 NOTE — Progress Notes (Signed)
Anesthesia Chart Review   Case: [id number] Date/Time: 01/03/22 1445   Procedure: REVERSE SHOULDER ARTHROPLASTY (Right: Shoulder)   Anesthesia type: General   Pre-op diagnosis: Right shoulder rotator cuff tear arthropathy   Location: [facility] ROOM 06 / [facility]   Surgeons: Justice Britain, MD       DISCUSSION:71 y.o. never smoker with h/o HTN, DM II, sleep apnea, CVA, s/p gastric bypass, right shoulder arthropathy scheduled for above procedure 01/03/2022 with Dr. Justice Britain.   Clearance from PCP received, on chart.  Per note pt can hold Plavix 7 days prior to procedure.   Clearance from cardiology received, on chart, which states that from a cardio prospective he is ok to proceed without further cardiac testing.    S/p lumbar fusion L4-S1 with hardware intact.  Anticipate pt can proceed with planned procedure barring acute status change.   VS: BP (!) 165/97   Pulse 67   Temp 37.2 C (Oral)   Resp 16   Ht 5' 10"  (1.778 m)   Wt 93 kg   SpO2 97%   BMI 29.41 kg/m   PROVIDERS: [place], Delrae Rend, PA is PCP   Ernesta Amble, MD is Cardiologist  LABS: Labs reviewed: Acceptable for surgery. (all labs ordered are listed, but only abnormal results are displayed)  Labs Reviewed  GLUCOSE, CAPILLARY - Abnormal; Notable for the following components:      Result Value   Glucose-Capillary 138 (*)    All other components within normal limits  HEMOGLOBIN A1C - Abnormal; Notable for the following components:   Hgb A1c MFr Bld 7.3 (*)    All other components within normal limits  BASIC METABOLIC PANEL - Abnormal; Notable for the following components:   Glucose, Bld 147 (*)    All other components within normal limits  CBC - Abnormal; Notable for the following components:   RDW 15.9 (*)    All other components within normal limits  SURGICAL PCR SCREEN     IMAGES:   EKG:   CV: Echocardiogram September, 2018: NORMAL LV SYSTOLIC FUNCTION WITH MILD DIASTOLIC DYSFUNCTION NO SIGNIFICANT  VALVULAR DYSFUNCTION NORMAL RV SIZE AND FUNCTION NORMAL RIGHT ATRIAL PRESSURE Past Medical History:  Diagnosis Date   Acute ischemic stroke ([place]) 02/22/2013   CVA (cerebral infarction) 01/22/2013   Diabetes mellitus ([place])    Difficulty in walking(719.7) 01/25/2013   History of Roux-en-Y gastric bypass    Hypertension    IMPINGEMENT SYNDROME 01/16/2009   Qualifier: Diagnosis of  By: Aline Brochure MD, [place]     KNEE PAIN 01/16/2009   Qualifier: Diagnosis of  By: Aline Brochure MD, Stanley     Knee pain, right 09/05/2011   KNEE, ARTHRITIS, DEGEN./OSTEO 01/30/2009   Qualifier: Diagnosis of  By: Aline Brochure MD, Stanley     Lack of coordination 01/22/2013   Non-ischemic cardiomyopathy ([place])    Osteoarthritis of knee    Other obesity due to excess calories 01/30/2015   Overweight(278.02)    Pulmonary hypertension ([place])    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 12/2014, Dr. Flavia Shipper   S/P lumbar fusion 07/02/2013   SHOULDER PAIN 01/16/2009   Qualifier: Diagnosis of  By: Aline Brochure MD, Stanley     Shoulder pain, right    Sleep apnea    cpap   Sleep apnea    Spinal stenosis    Stroke [facility])    TENDINITIS, LEFT KNEE 01/16/2009   Qualifier: Diagnosis of  By: Aline Brochure MD, Dorothyann Peng  Vitamin D deficiency     Past Surgical History:  Procedure Laterality Date   BACK SURGERY     CARDIAC CATHETERIZATION     gastric bypass surgery      KNEE ARTHROSCOPY     loop recorder  12/01/2018   Per [employer] Rep Chris-pt is unsafe to have any MRI-Loop recorder is end of life   10/01/2021   NASAL SEPTUM SURGERY      MEDICATIONS:  ascorbic acid (VITAMIN C) 500 MG tablet   atorvastatin (LIPITOR) 10 MG tablet   Blood Glucose Monitoring Suppl (ACCU-CHEK GUIDE ME) w/Device KIT   Calcium Citrate-Vitamin D (CALCIUM + D PO)   cetirizine (ZYRTEC) 10 MG tablet   Cholecalciferol (VITAMIN D) 125 MCG (5000 UT) CAPS   clopidogrel (PLAVIX) 75 MG tablet   Dulaglutide  (TRULICITY) 2.50 IB/7.0WU SOPN   fluticasone (FLONASE) 50 MCG/ACT nasal spray   glucose blood (ACCU-CHEK GUIDE) test strip   hydrochlorothiazide (HYDRODIURIL) 12.5 MG tablet   lisinopril (PRINIVIL,ZESTRIL) 40 MG tablet   metFORMIN (GLUCOPHAGE) 500 MG tablet   METHYLCELLULOSE, LAXATIVE, PO   Multiple Vitamins-Minerals (CENTRUM PO)   potassium chloride SA (KLOR-CON M) 20 MEQ tablet   Probiotic Product (ALIGN) 4 MG CAPS   sertraline (ZOLOFT) 50 MG tablet   zinc gluconate 50 MG tablet   No current facility-administered medications for this encounter.    Konrad Felix Ward, PA-C [facility] Pre-Surgical Testing [phone]

## 2021-12-26 NOTE — Anesthesia Preprocedure Evaluation (Addendum)
Anesthesia Evaluation  Patient identified by MRN, date of birth, ID band Patient awake    Reviewed: Allergy & Precautions, NPO status , Patient's Chart, lab work & pertinent test results, reviewed documented beta blocker date and time   Airway Mallampati: II  TM Distance: >3 FB Neck ROM: Full    Dental  (+) Teeth Intact, Caps, Dental Advisory Given   Pulmonary sleep apnea and Continuous Positive Airway Pressure Ventilation ,    Pulmonary exam normal breath sounds clear to auscultation       Cardiovascular hypertension, Pt. on medications pulmonary hypertensionNormal cardiovascular exam+ dysrhythmias  Rhythm:Regular Rate:Normal  NICM  EKG 12/25/21 NSR, RBBB + LAFB   Neuro/Psych 02/2013 ischemic stroke CVA, Residual Symptoms negative psych ROS   GI/Hepatic Neg liver ROS, Hx/o Roux en Y gastric bypass   Endo/Other  diabetes, Well Controlled, Type 2GLP-1 agonist therapy- last dose more than one week Hyperlipidemia  Renal/GU negative Renal ROS  negative genitourinary   Musculoskeletal  (+) Arthritis , Osteoarthritis,  Right shoulder Rotator cuff tear arthropathy   Abdominal   Peds  Hematology Plavix therapy- last dose 1 week ago   Anesthesia Other Findings   Reproductive/Obstetrics                           Anesthesia Physical Anesthesia Plan  ASA: 3  Anesthesia Plan: General   Post-op Pain Management: Regional block* and Minimal or no pain anticipated   Induction: Intravenous  PONV Risk Score and Plan: 3 and Treatment may vary due to age or medical condition, Ondansetron and Dexamethasone  Airway Management Planned: Oral ETT  Additional Equipment: None  Intra-op Plan:   Post-operative Plan: Extubation in OR  Informed Consent: I have reviewed the patients History and Physical, chart, labs and discussed the procedure including the risks, benefits and alternatives for the proposed  anesthesia with the patient or authorized representative who has indicated his/her understanding and acceptance.     Dental advisory given  Plan Discussed with: Anesthesiologist and CRNA  Anesthesia Plan Comments: (See PAT note 12/25/2021)      Anesthesia Quick Evaluation

## 2021-12-27 DIAGNOSIS — E876 Hypokalemia: Secondary | ICD-10-CM | POA: Diagnosis not present

## 2021-12-27 DIAGNOSIS — Z23 Encounter for immunization: Secondary | ICD-10-CM | POA: Diagnosis not present

## 2021-12-27 DIAGNOSIS — Z0000 Encounter for general adult medical examination without abnormal findings: Secondary | ICD-10-CM | POA: Diagnosis not present

## 2021-12-27 DIAGNOSIS — Z7901 Long term (current) use of anticoagulants: Secondary | ICD-10-CM | POA: Diagnosis not present

## 2021-12-27 DIAGNOSIS — G4733 Obstructive sleep apnea (adult) (pediatric): Secondary | ICD-10-CM | POA: Diagnosis not present

## 2021-12-27 DIAGNOSIS — F419 Anxiety disorder, unspecified: Secondary | ICD-10-CM | POA: Diagnosis not present

## 2021-12-27 DIAGNOSIS — I151 Hypertension secondary to other renal disorders: Secondary | ICD-10-CM | POA: Diagnosis not present

## 2021-12-27 DIAGNOSIS — E1169 Type 2 diabetes mellitus with other specified complication: Secondary | ICD-10-CM | POA: Diagnosis not present

## 2021-12-27 DIAGNOSIS — Z8673 Personal history of transient ischemic attack (TIA), and cerebral infarction without residual deficits: Secondary | ICD-10-CM | POA: Diagnosis not present

## 2021-12-31 ENCOUNTER — Telehealth: Payer: Self-pay | Admitting: "Endocrinology

## 2021-12-31 NOTE — Telephone Encounter (Signed)
Pt's wife called and said he is going to be having shoulder surgery 10/26 at 3 pm. The instructions were to stop Trulicity (as in today) he took it last Monday, and to stop the metformin the day of the surgery. His last 3 blood sugar readings were 139, 145, 147. She thinks that he is headed to high readings and she is concerned. Please Advise.

## 2022-01-01 NOTE — Telephone Encounter (Signed)
Discussed with pt's wife, understanding voiced. 

## 2022-01-03 ENCOUNTER — Other Ambulatory Visit: Payer: Self-pay

## 2022-01-03 ENCOUNTER — Ambulatory Visit (HOSPITAL_COMMUNITY)
Admission: RE | Admit: 2022-01-03 | Discharge: 2022-01-03 | Disposition: A | Discharge status: Home / Self Care | Payer: Medicare PPO | Attending: Orthopedic Surgery | Admitting: Orthopedic Surgery

## 2022-01-03 ENCOUNTER — Ambulatory Visit (HOSPITAL_BASED_OUTPATIENT_CLINIC_OR_DEPARTMENT_OTHER): Payer: Medicare PPO | Admitting: Anesthesiology

## 2022-01-03 ENCOUNTER — Encounter (HOSPITAL_COMMUNITY)
Admission: RE | Disposition: A | Discharge status: Home / Self Care | Payer: Self-pay | Source: Home / Self Care | Attending: Orthopedic Surgery

## 2022-01-03 ENCOUNTER — Encounter (HOSPITAL_COMMUNITY): Payer: Self-pay | Admitting: Orthopedic Surgery

## 2022-01-03 ENCOUNTER — Ambulatory Visit (HOSPITAL_COMMUNITY): Payer: Medicare PPO | Admitting: Physician Assistant

## 2022-01-03 DIAGNOSIS — G4733 Obstructive sleep apnea (adult) (pediatric): Secondary | ICD-10-CM | POA: Diagnosis not present

## 2022-01-03 DIAGNOSIS — M19011 Primary osteoarthritis, right shoulder: Secondary | ICD-10-CM | POA: Diagnosis not present

## 2022-01-03 DIAGNOSIS — Z7902 Long term (current) use of antithrombotics/antiplatelets: Secondary | ICD-10-CM | POA: Insufficient documentation

## 2022-01-03 DIAGNOSIS — I10 Essential (primary) hypertension: Secondary | ICD-10-CM | POA: Diagnosis not present

## 2022-01-03 DIAGNOSIS — M75101 Unspecified rotator cuff tear or rupture of right shoulder, not specified as traumatic: Secondary | ICD-10-CM | POA: Insufficient documentation

## 2022-01-03 DIAGNOSIS — M12811 Other specific arthropathies, not elsewhere classified, right shoulder: Secondary | ICD-10-CM | POA: Diagnosis not present

## 2022-01-03 DIAGNOSIS — I6930 Unspecified sequelae of cerebral infarction: Secondary | ICD-10-CM | POA: Insufficient documentation

## 2022-01-03 DIAGNOSIS — Z6829 Body mass index (BMI) 29.0-29.9, adult: Secondary | ICD-10-CM | POA: Insufficient documentation

## 2022-01-03 DIAGNOSIS — E119 Type 2 diabetes mellitus without complications: Secondary | ICD-10-CM | POA: Diagnosis not present

## 2022-01-03 DIAGNOSIS — Z9884 Bariatric surgery status: Secondary | ICD-10-CM | POA: Diagnosis not present

## 2022-01-03 DIAGNOSIS — G4730 Sleep apnea, unspecified: Secondary | ICD-10-CM | POA: Diagnosis not present

## 2022-01-03 DIAGNOSIS — I452 Bifascicular block: Secondary | ICD-10-CM | POA: Insufficient documentation

## 2022-01-03 DIAGNOSIS — G8918 Other acute postprocedural pain: Secondary | ICD-10-CM | POA: Diagnosis not present

## 2022-01-03 DIAGNOSIS — E785 Hyperlipidemia, unspecified: Secondary | ICD-10-CM | POA: Insufficient documentation

## 2022-01-03 DIAGNOSIS — Z9989 Dependence on other enabling machines and devices: Secondary | ICD-10-CM

## 2022-01-03 DIAGNOSIS — Z79899 Other long term (current) drug therapy: Secondary | ICD-10-CM | POA: Insufficient documentation

## 2022-01-03 DIAGNOSIS — Z01818 Encounter for other preprocedural examination: Secondary | ICD-10-CM

## 2022-01-03 DIAGNOSIS — Z7985 Long-term (current) use of injectable non-insulin antidiabetic drugs: Secondary | ICD-10-CM | POA: Insufficient documentation

## 2022-01-03 DIAGNOSIS — Z833 Family history of diabetes mellitus: Secondary | ICD-10-CM | POA: Insufficient documentation

## 2022-01-03 DIAGNOSIS — Z7984 Long term (current) use of oral hypoglycemic drugs: Secondary | ICD-10-CM | POA: Diagnosis not present

## 2022-01-03 HISTORY — PX: REVERSE SHOULDER ARTHROPLASTY: SHX5054

## 2022-01-03 LAB — GLUCOSE, CAPILLARY
Glucose-Capillary: 130 mg/dL — ABNORMAL HIGH (ref 70–99)
Glucose-Capillary: 132 mg/dL — ABNORMAL HIGH (ref 70–99)

## 2022-01-03 SURGERY — ARTHROPLASTY, SHOULDER, TOTAL, REVERSE
Anesthesia: General | Site: Shoulder | Laterality: Right

## 2022-01-03 MED ORDER — LACTATED RINGERS IV BOLUS
250.0000 mL | Freq: Once | INTRAVENOUS | Status: AC
  Administered 2022-01-03: 250 mL via INTRAVENOUS

## 2022-01-03 MED ORDER — METOCLOPRAMIDE HCL 5 MG PO TABS: 5.0000 mg | ORAL_TABLET | Freq: Three times a day (TID) | ORAL | Status: DC | PRN

## 2022-01-03 MED ORDER — FENTANYL CITRATE (PF) 100 MCG/2ML IJ SOLN
INTRAMUSCULAR | Status: AC
  Filled 2022-01-03: qty 2

## 2022-01-03 MED ORDER — LACTATED RINGERS IV BOLUS
500.0000 mL | Freq: Once | INTRAVENOUS | Status: AC
  Administered 2022-01-03: 500 mL via INTRAVENOUS

## 2022-01-03 MED ORDER — STERILE WATER FOR IRRIGATION IR SOLN
Status: DC | PRN
  Administered 2022-01-03: 2000 mL

## 2022-01-03 MED ORDER — ROCURONIUM BROMIDE 10 MG/ML (PF) SYRINGE
PREFILLED_SYRINGE | INTRAVENOUS | Status: AC
  Filled 2022-01-03: qty 10

## 2022-01-03 MED ORDER — CEFAZOLIN SODIUM-DEXTROSE 2-4 GM/100ML-% IV SOLN
2.0000 g | INTRAVENOUS | Status: AC
  Administered 2022-01-03: 2 g via INTRAVENOUS
  Filled 2022-01-03: qty 100

## 2022-01-03 MED ORDER — BUPIVACAINE LIPOSOME 1.3 % IJ SUSP
INTRAMUSCULAR | Status: DC | PRN
  Administered 2022-01-03: 10 mL via PERINEURAL

## 2022-01-03 MED ORDER — SUGAMMADEX SODIUM 200 MG/2ML IV SOLN
INTRAVENOUS | Status: DC | PRN
  Administered 2022-01-03: 200 mg via INTRAVENOUS

## 2022-01-03 MED ORDER — ONDANSETRON HCL 4 MG/2ML IJ SOLN
INTRAMUSCULAR | Status: AC
  Filled 2022-01-03: qty 2

## 2022-01-03 MED ORDER — VANCOMYCIN HCL 1000 MG IV SOLR
INTRAVENOUS | Status: DC | PRN
  Administered 2022-01-03: 1000 mg

## 2022-01-03 MED ORDER — ONDANSETRON HCL 4 MG/2ML IJ SOLN
INTRAMUSCULAR | Status: DC | PRN
  Administered 2022-01-03: 4 mg via INTRAVENOUS

## 2022-01-03 MED ORDER — LACTATED RINGERS IV SOLN: INTRAVENOUS | Status: DC

## 2022-01-03 MED ORDER — BUPIVACAINE HCL (PF) 0.5 % IJ SOLN
INTRAMUSCULAR | Status: DC | PRN
  Administered 2022-01-03: 20 mL via PERINEURAL

## 2022-01-03 MED ORDER — PHENYLEPHRINE HCL-NACL 20-0.9 MG/250ML-% IV SOLN
INTRAVENOUS | Status: DC | PRN
  Administered 2022-01-03: 35 ug/min via INTRAVENOUS

## 2022-01-03 MED ORDER — MIDAZOLAM HCL 2 MG/2ML IJ SOLN
1.0000 mg | INTRAMUSCULAR | Status: DC
  Administered 2022-01-03: 1 mg via INTRAVENOUS
  Filled 2022-01-03: qty 2

## 2022-01-03 MED ORDER — FENTANYL CITRATE (PF) 100 MCG/2ML IJ SOLN
INTRAMUSCULAR | Status: DC | PRN
  Administered 2022-01-03: 100 ug via INTRAVENOUS

## 2022-01-03 MED ORDER — PROPOFOL 10 MG/ML IV BOLUS
INTRAVENOUS | Status: DC | PRN
  Administered 2022-01-03: 100 mg via INTRAVENOUS

## 2022-01-03 MED ORDER — TRANEXAMIC ACID-NACL 1000-0.7 MG/100ML-% IV SOLN
1000.0000 mg | INTRAVENOUS | Status: AC
  Administered 2022-01-03: 1000 mg via INTRAVENOUS
  Filled 2022-01-03: qty 100

## 2022-01-03 MED ORDER — ESMOLOL HCL 100 MG/10ML IV SOLN
INTRAVENOUS | Status: AC
  Filled 2022-01-03: qty 10

## 2022-01-03 MED ORDER — 0.9 % SODIUM CHLORIDE (POUR BTL) OPTIME
TOPICAL | Status: DC | PRN
  Administered 2022-01-03: 1000 mL

## 2022-01-03 MED ORDER — ONDANSETRON HCL 4 MG PO TABS: 4.0000 mg | ORAL_TABLET | Freq: Four times a day (QID) | ORAL | Status: DC | PRN

## 2022-01-03 MED ORDER — DEXAMETHASONE SODIUM PHOSPHATE 10 MG/ML IJ SOLN
INTRAMUSCULAR | Status: DC | PRN
  Administered 2022-01-03: 10 mg via INTRAVENOUS

## 2022-01-03 MED ORDER — LIDOCAINE 2% (20 MG/ML) 5 ML SYRINGE
INTRAMUSCULAR | Status: DC | PRN
  Administered 2022-01-03: 60 mg via INTRAVENOUS

## 2022-01-03 MED ORDER — OXYCODONE-ACETAMINOPHEN 5-325 MG PO TABS: 1.0000 | ORAL_TABLET | ORAL | 0 refills | Status: DC | PRN

## 2022-01-03 MED ORDER — ONDANSETRON HCL 4 MG PO TABS: 4.0000 mg | ORAL_TABLET | Freq: Three times a day (TID) | ORAL | 0 refills | Status: DC | PRN

## 2022-01-03 MED ORDER — CYCLOBENZAPRINE HCL 10 MG PO TABS: 10.0000 mg | ORAL_TABLET | Freq: Three times a day (TID) | ORAL | 1 refills | Status: DC | PRN

## 2022-01-03 MED ORDER — ONDANSETRON HCL 4 MG/2ML IJ SOLN: 4.0000 mg | Freq: Four times a day (QID) | INTRAMUSCULAR | Status: DC | PRN

## 2022-01-03 MED ORDER — FENTANYL CITRATE PF 50 MCG/ML IJ SOSY
50.0000 ug | PREFILLED_SYRINGE | INTRAMUSCULAR | Status: DC
  Administered 2022-01-03: 50 ug via INTRAVENOUS
  Filled 2022-01-03: qty 2

## 2022-01-03 MED ORDER — DEXAMETHASONE SODIUM PHOSPHATE 10 MG/ML IJ SOLN
INTRAMUSCULAR | Status: AC
  Filled 2022-01-03: qty 1

## 2022-01-03 MED ORDER — LIDOCAINE HCL (PF) 2 % IJ SOLN
INTRAMUSCULAR | Status: AC
  Filled 2022-01-03: qty 5

## 2022-01-03 MED ORDER — METOCLOPRAMIDE HCL 5 MG/ML IJ SOLN: 5.0000 mg | Freq: Three times a day (TID) | INTRAMUSCULAR | Status: DC | PRN

## 2022-01-03 MED ORDER — HYDROMORPHONE HCL 1 MG/ML IJ SOLN: 0.2500 mg | INTRAMUSCULAR | Status: DC | PRN

## 2022-01-03 MED ORDER — OXYCODONE HCL 5 MG/5ML PO SOLN: 5.0000 mg | Freq: Once | ORAL | Status: DC | PRN

## 2022-01-03 MED ORDER — ESMOLOL HCL 100 MG/10ML IV SOLN
INTRAVENOUS | Status: DC | PRN
  Administered 2022-01-03: 50 mg via INTRAVENOUS
  Administered 2022-01-03 (×2): 20 mg via INTRAVENOUS

## 2022-01-03 MED ORDER — CHLORHEXIDINE GLUCONATE 0.12 % MT SOLN
15.0000 mL | Freq: Once | OROMUCOSAL | Status: AC
  Administered 2022-01-03: 15 mL via OROMUCOSAL

## 2022-01-03 MED ORDER — ROCURONIUM BROMIDE 10 MG/ML (PF) SYRINGE
PREFILLED_SYRINGE | INTRAVENOUS | Status: DC | PRN
  Administered 2022-01-03: 80 mg via INTRAVENOUS

## 2022-01-03 MED ORDER — PROPOFOL 10 MG/ML IV BOLUS
INTRAVENOUS | Status: AC
  Filled 2022-01-03: qty 20

## 2022-01-03 MED ORDER — ORAL CARE MOUTH RINSE: 15.0000 mL | Freq: Once | OROMUCOSAL | Status: AC

## 2022-01-03 MED ORDER — VANCOMYCIN HCL 1000 MG IV SOLR
INTRAVENOUS | Status: AC
  Filled 2022-01-03: qty 20

## 2022-01-03 MED ORDER — OXYCODONE HCL 5 MG PO TABS: 5.0000 mg | ORAL_TABLET | Freq: Once | ORAL | Status: DC | PRN

## 2022-01-03 SURGICAL SUPPLY — 71 items
BAG COUNTER SPONGE SURGICOUNT (BAG) IMPLANT
BAG ZIPLOCK 12X15 (MISCELLANEOUS) ×1 IMPLANT
BLADE SAW SGTL 83.5X18.5 (BLADE) ×1 IMPLANT
BNDG COHESIVE 4X5 TAN STRL LF (GAUZE/BANDAGES/DRESSINGS) ×1 IMPLANT
COOLER ICEMAN CLASSIC (MISCELLANEOUS) ×1 IMPLANT
COVER BACK TABLE 60X90IN (DRAPES) ×1 IMPLANT
COVER SURGICAL LIGHT HANDLE (MISCELLANEOUS) ×1 IMPLANT
CUP SUT UNIV REVERS 42 NEUT (Shoulder) IMPLANT
DERMABOND ADVANCED .7 DNX12 (GAUZE/BANDAGES/DRESSINGS) ×1 IMPLANT
DRAPE INCISE IOBAN 66X45 STRL (DRAPES) IMPLANT
DRAPE ORTHO SPLIT 77X108 STRL (DRAPES) ×2
DRAPE SHEET LG 3/4 BI-LAMINATE (DRAPES) ×1 IMPLANT
DRAPE SURG 17X11 SM STRL (DRAPES) ×1 IMPLANT
DRAPE SURG ORHT 6 SPLT 77X108 (DRAPES) ×2 IMPLANT
DRAPE TOP 10253 STERILE (DRAPES) ×1 IMPLANT
DRAPE U-SHAPE 47X51 STRL (DRAPES) ×1 IMPLANT
DRESSING AQUACEL AG SP 3.5X6 (GAUZE/BANDAGES/DRESSINGS) ×1 IMPLANT
DRSG AQUACEL AG ADV 3.5X 6 (GAUZE/BANDAGES/DRESSINGS) IMPLANT
DRSG AQUACEL AG ADV 3.5X10 (GAUZE/BANDAGES/DRESSINGS) IMPLANT
DRSG AQUACEL AG SP 3.5X6 (GAUZE/BANDAGES/DRESSINGS) ×1
DRSG TEGADERM 8X12 (GAUZE/BANDAGES/DRESSINGS) ×1 IMPLANT
DURAPREP 26ML APPLICATOR (WOUND CARE) ×1 IMPLANT
ELECT BLADE TIP CTD 4 INCH (ELECTRODE) ×1 IMPLANT
ELECT PENCIL ROCKER SW 15FT (MISCELLANEOUS) ×1 IMPLANT
ELECT REM PT RETURN 15FT ADLT (MISCELLANEOUS) ×1 IMPLANT
FACESHIELD WRAPAROUND (MASK) ×5 IMPLANT
FACESHIELD WRAPAROUND OR TEAM (MASK) ×4 IMPLANT
GLENOID SYS 42 +4 LAT/24 SHLDR (Miscellaneous) IMPLANT
GLENOID UNI REV MOD 24 +2 LAT (Joint) IMPLANT
GLOVE BIO SURGEON STRL SZ7.5 (GLOVE) ×1 IMPLANT
GLOVE BIO SURGEON STRL SZ8 (GLOVE) ×1 IMPLANT
GLOVE SS BIOGEL STRL SZ 7 (GLOVE) ×1 IMPLANT
GLOVE SS BIOGEL STRL SZ 7.5 (GLOVE) ×1 IMPLANT
GOWN STRL SURGICAL XL XLNG (GOWN DISPOSABLE) ×2 IMPLANT
INSERT HUMERAL L/42+3 IN 42CUP (Shoulder) IMPLANT
KIT BASIN OR (CUSTOM PROCEDURE TRAY) ×1 IMPLANT
KIT TURNOVER KIT A (KITS) IMPLANT
MANIFOLD NEPTUNE II (INSTRUMENTS) ×1 IMPLANT
NDL TAPERED W/ NITINOL LOOP (MISCELLANEOUS) ×1 IMPLANT
NEEDLE TAPERED W/ NITINOL LOOP (MISCELLANEOUS) ×1 IMPLANT
NS IRRIG 1000ML POUR BTL (IV SOLUTION) ×1 IMPLANT
PACK SHOULDER (CUSTOM PROCEDURE TRAY) ×1 IMPLANT
PAD ARMBOARD 7.5X6 YLW CONV (MISCELLANEOUS) ×1 IMPLANT
PAD COLD SHLDR WRAP-ON (PAD) ×1 IMPLANT
PAD ORTHO SHOULDER 7X19 LRG (SOFTGOODS) IMPLANT
PIN NITINOL TARGETER 2.8 (PIN) IMPLANT
PIN SET MODULAR GLENOID SYSTEM (PIN) IMPLANT
RESTRAINT HEAD UNIVERSAL NS (MISCELLANEOUS) ×1 IMPLANT
SCREW CENTRAL MOD 30MM (Screw) IMPLANT
SCREW PERI LOCK 5.5X24 (Screw) IMPLANT
SCREW PERI LOCK 5.5X32 (Screw) IMPLANT
SCREW PERIPHERAL 5.5X20 LOCK (Screw) IMPLANT
SLING ARM FOAM STRAP LRG (SOFTGOODS) IMPLANT
SLING ARM FOAM STRAP MED (SOFTGOODS) IMPLANT
SPACER SHLD UNI REV 42 +6 (Shoulder) IMPLANT
SPONGE T-LAP 4X18 ~~LOC~~+RFID (SPONGE) ×1 IMPLANT
STEM HUMERAL UNI REV SIZE 12 (Stem) IMPLANT
SUCTION FRAZIER HANDLE 12FR (TUBING) ×1
SUCTION TUBE FRAZIER 12FR DISP (TUBING) ×1 IMPLANT
SUT FIBERWIRE #2 38 T-5 BLUE (SUTURE)
SUT MNCRL AB 3-0 PS2 18 (SUTURE) ×1 IMPLANT
SUT MON AB 2-0 CT1 36 (SUTURE) ×1 IMPLANT
SUT VIC AB 1 CT1 36 (SUTURE) ×1 IMPLANT
SUTURE FIBERWR #2 38 T-5 BLUE (SUTURE) IMPLANT
SUTURE TAPE 1.3 40 TPR END (SUTURE) ×2 IMPLANT
SUTURETAPE 1.3 40 TPR END (SUTURE) ×2
TOWEL OR 17X26 10 PK STRL BLUE (TOWEL DISPOSABLE) ×1 IMPLANT
TOWEL OR NON WOVEN STRL DISP B (DISPOSABLE) ×1 IMPLANT
TUBE SUCTION HIGH CAP CLEAR NV (SUCTIONS) ×1 IMPLANT
WATER STERILE IRR 1000ML POUR (IV SOLUTION) ×2 IMPLANT
YANKAUER SUCT BULB TIP 10FT TU (MISCELLANEOUS) IMPLANT

## 2022-01-03 NOTE — Discharge Instructions (Signed)

## 2022-01-03 NOTE — Evaluation (Signed)
Occupational Therapy Evaluation Patient Details Name: Zachary Murillo. MRN: [id number] DOB: 1950-08-13 Today's Date: 01/03/2022   History of Present Illness Mr. Rathbone is s/p a R shoulder reverse arthroplasty on 01-03-2022, due to shoulder rotator cuff tear arthropathy.   Clinical Impression   s/p shoulder replacement without functional use of right upper extremity secondary to effects of surgery and interscalene block and shoulder precautions. Patient seen pre-op, with therapist providing education and instruction to the patient, his spouse, and daughter with regards to UE exercises, precautions, UE positioning, donning upper extremity clothing and bathing while maintaining shoulder precautions, use of ice for edema and pain management, how to correctly donn/doff sling, and compensatory techniques for ADLs. Patient, spouse, and/or his daughter verbalized understanding and demonstrated understanding as needed. OT will follow the pt for intervention(s)/education during his hospital stay to ensure adequate carryover of aforementioned education. Patient to follow up with MD for further therapy needs.        Recommendations for follow up therapy are one component of a multi-disciplinary discharge planning process, led by the attending physician.  Recommendations may be updated based on patient status, additional functional criteria and insurance authorization.   Follow Up Recommendations  Follow physician's recommendations for discharge plan and follow up therapies    Assistance Recommended at Discharge Intermittent Supervision/Assistance  Patient can return home with the following Assistance with cooking/housework;Assist for transportation;Help with stairs or ramp for entrance;A little help with bathing/dressing/bathroom    Functional Status Assessment  Patient has had a recent decline in their functional status and demonstrates the ability to make significant improvements in function in a  reasonable and predictable amount of time.  Equipment Recommendations  None recommended by OT       Precautions / Restrictions Precautions Precautions: Shoulder Type of Shoulder Precautions: If sitting in controlled environment, ok to come out of sling to give neck a break. Please sleep in it to protect until follow up in office.     OK to use operative arm for feeding, hygiene and ADLs.   Ok to instruct Pendulums and lap slides as exercises. Ok to use operative arm within the following parameters for ADL purposes     New ROM (8/18)   Ok for PROM, AAROM, AROM within pain tolerance and within the following ROM   ER 20   ABD 45   FE 60 Shoulder Interventions: Shoulder sling/immobilizer Precaution Booklet Issued: Yes (comment) Required Braces or Orthoses: Sling Restrictions Weight Bearing Restrictions: Yes RUE Weight Bearing: Non weight bearing           Pertinent Vitals/Pain Pain Assessment Pain Assessment: No/denies pain     Hand Dominance     Extremity/Trunk Assessment             Communication     Cognition Arousal/Alertness: Awake/alert Behavior During Therapy: WFL for tasks assessed/performed Overall Cognitive Status: Within Functional Limits for tasks assessed                    Exercises     Shoulder Instructions Shoulder Instructions Donning/doffing shirt without moving shoulder: Caregiver independent with task Method for sponge bathing under operated UE: Caregiver independent with task Donning/doffing sling/immobilizer: Caregiver independent with task Correct positioning of sling/immobilizer: Caregiver independent with task Pendulum exercises (written home exercise program): Caregiver independent with task ROM for elbow, wrist and digits of operated UE: Caregiver independent with task Sling wearing schedule (on at all times/off for ADL's): Caregiver independent with task Dressing change: Caregiver independent  with task Positioning of UE while sleeping:  Caregiver independent with task               OT Problem List: Decreased range of motion;Pain;Impaired UE functional use      OT Treatment/Interventions: Self-care/ADL training;Therapeutic exercise;Energy conservation;DME and/or AE instruction;Therapeutic activities;Patient/family education;Balance training    OT Goals(Current goals can be found in the care plan section) Acute Rehab OT Goals Patient Stated Goal: the pt did not specifically state OT Goal Formulation: With patient Time For Goal Achievement: 01/17/22 Potential to Achieve Goals: Good  OT Frequency: Min 2X/week       AM-PAC OT "6 Clicks" Daily Activity     Outcome Measure Help from another person eating meals?: None Help from another person taking care of personal grooming?: A Little Help from another person toileting, which includes using toliet, bedpan, or urinal?: A Little Help from another person bathing (including washing, rinsing, drying)?: A Little Help from another person to put on and taking off regular upper body clothing?: A Little Help from another person to put on and taking off regular lower body clothing?: A Little 6 Click Score: 19   End of Session Nurse Communication: Other (comment) (Nurse cleared the pt for therapy)  Activity Tolerance: Patient tolerated treatment well Patient left: in bed;with call bell/phone within reach  OT Visit Diagnosis: Muscle weakness (generalized) (M62.81)                Time: 1315-1350 OT Time Calculation (min): 35 min Charges:  OT General Charges $OT Visit: 1 Visit OT Evaluation $OT Eval Low Complexity: 1 Low OT Treatments $Self Care/Home Management : 8-22 mins    Leota Sauers, OTR/L 01/03/2022, 4:45 PM

## 2022-01-03 NOTE — Anesthesia Postprocedure Evaluation (Signed)
Anesthesia Post Note  Patient: Zachary Murillo.  Procedure(s) Performed: REVERSE SHOULDER ARTHROPLASTY (Right: Shoulder)     Patient location during evaluation: PACU Anesthesia Type: General Level of consciousness: awake and alert and oriented Pain management: pain level controlled Vital Signs Assessment: post-procedure vital signs reviewed and stable Respiratory status: spontaneous breathing, nonlabored ventilation and respiratory function stable Cardiovascular status: blood pressure returned to baseline and stable Postop Assessment: no apparent nausea or vomiting Anesthetic complications: no   No notable events documented.  Last Vitals:  Vitals:   01/03/22 1615 01/03/22 1636  BP: (!) 159/88 (!) 164/85  Pulse: 69 66  Resp: (!) 6 14  Temp: 36.5 C 36.6 C  SpO2: 96% 93%    Last Pain:  Vitals:   01/03/22 1636  TempSrc:   PainSc: 0-No pain                 Shevelle Smither A.

## 2022-01-03 NOTE — H&P (Signed)
Zachary Murillo.    Chief Complaint: Right shoulder rotator cuff tear arthropathy HPI: The patient is a 71 y.o. male with chronic and progressive increasing right shoulder pain related to severe osteoarthritis and associated rotator cuff dysfunction and degeneration.  Due to his increasing functional limitations and failure to respond to prolonged attempts at conservative management, he is brought to the operating room this time for planned right shoulder reverse arthroplasty  Past Medical History:  Diagnosis Date   Acute ischemic stroke ([place]) 02/22/2013   CVA (cerebral infarction) 01/22/2013   Diabetes mellitus ([place])    Difficulty in walking(719.7) 01/25/2013   History of Roux-en-Y gastric bypass    Hypertension    IMPINGEMENT SYNDROME 01/16/2009   Qualifier: Diagnosis of  By: Aline Brochure MD, Stanley     KNEE PAIN 01/16/2009   Qualifier: Diagnosis of  By: Aline Brochure MD, Stanley     Knee pain, right 09/05/2011   KNEE, ARTHRITIS, DEGEN./OSTEO 01/30/2009   Qualifier: Diagnosis of  By: Aline Brochure MD, Stanley     Lack of coordination 01/22/2013   Non-ischemic cardiomyopathy ([place])    Osteoarthritis of knee    Other obesity due to excess calories 01/30/2015   Overweight(278.02)    Pulmonary hypertension ([place])    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 12/2014, Dr. Flavia Shipper   S/P lumbar fusion 07/02/2013   SHOULDER PAIN 01/16/2009   Qualifier: Diagnosis of  By: Aline Brochure MD, Stanley     Shoulder pain, right    Sleep apnea    cpap   Sleep apnea    Spinal stenosis    Stroke [facility])    TENDINITIS, LEFT KNEE 01/16/2009   Qualifier: Diagnosis of  By: Aline Brochure MD, Arlana Hove D deficiency       Past Surgical History:  Procedure Laterality Date   BACK SURGERY     CARDIAC CATHETERIZATION     gastric bypass surgery      KNEE ARTHROSCOPY     loop recorder  12/01/2018   Per [employer] Rep Chris-pt is unsafe to have any MRI-Loop  recorder is end of life   10/01/2021   NASAL SEPTUM SURGERY      Family History  Problem Relation Age of Onset   Heart disease Other    Arthritis Other    Cancer Other    Asthma Other    Diabetes Other    Breast cancer Mother    Heart disease Mother    Heart disease Father    Stroke Father    Cancer Sister    Heart disease Brother    COPD Sister     Social History:  reports that he has never smoked. He has never used smokeless tobacco. He reports current alcohol use. He reports that he does not use drugs.  BMI: Estimated body mass index is 29.41 kg/m as calculated from the following:   Height as of 12/25/21: $RemoveBefo'5\' 10"'[id number]$  (1.778 m).   Weight as of 12/25/21: 93 kg.  Lab Results  Component Value Date   ALBUMIN 4.4 12/17/2021   Diabetes:   Patient has a diagnosis of diabetes,  Lab Results  Component Value Date   HGBA1C 7.3 (H) 12/25/2021   Smoking Status:   reports that he has never smoked. He has never used smokeless tobacco.     Medications Prior to Admission  Medication Sig Dispense Refill   ascorbic acid (VITAMIN C) 500 MG tablet Take 500 mg  by mouth daily.     atorvastatin (LIPITOR) 10 MG tablet TAKE (1) TABLET BY MOUTH AT BEDTIME. 90 tablet 0   Calcium Citrate-Vitamin D (CALCIUM + D PO) Take 600 mg by mouth 2 (two) times daily.     cetirizine (ZYRTEC) 10 MG tablet Take 10 mg by mouth daily as needed for allergies.      Cholecalciferol (VITAMIN D) 125 MCG (5000 UT) CAPS Take 5,000 Units by mouth daily.     clopidogrel (PLAVIX) 75 MG tablet Take 1 tablet (75 mg total) by mouth daily. 30 tablet 1   Dulaglutide (TRULICITY) 4.65 KP/5.4SF SOPN Inject 0.75 mg into the skin once a week. 2 mL 2   fluticasone (FLONASE) 50 MCG/ACT nasal spray Place 2 sprays into both nostrils daily as needed for allergies.     hydrochlorothiazide (HYDRODIURIL) 12.5 MG tablet Take 12.5 mg by mouth daily.     lisinopril (PRINIVIL,ZESTRIL) 40 MG tablet Take 40 mg by mouth every morning.      metFORMIN (GLUCOPHAGE) 500 MG tablet Take 1 tablet (500 mg total) by mouth 2 (two) times daily with a meal. 60 tablet 2   METHYLCELLULOSE, LAXATIVE, PO Take 1 packet by mouth daily. 15 ml     Multiple Vitamins-Minerals (CENTRUM PO) Take 1 tablet by mouth every morning. [place] chewable with extra iron     Probiotic Product (ALIGN) 4 MG CAPS Take 4 mg by mouth daily at 2 am.     sertraline (ZOLOFT) 50 MG tablet Take 1 tablet (50 mg total) by mouth daily. (Patient taking differently: Take 150 mg by mouth daily.) 30 tablet 12   zinc gluconate 50 MG tablet Take 50 mg by mouth daily.     Blood Glucose Monitoring Suppl (ACCU-CHEK GUIDE ME) w/Device KIT 1 Piece by Does not apply route as directed. 1 kit 0   glucose blood (ACCU-CHEK GUIDE) test strip USE TO TEST BLOOD SUGAR ONCE DAILY. E11.65 50 strip 5   potassium chloride SA (KLOR-CON M) 20 MEQ tablet Take 1 tablet (20 mEq total) by mouth daily. 90 tablet 1     Physical Exam: Right shoulder demonstrates severely restricted and painful motion as noted at his recent office visits.  He has globally decreased strength.  He is otherwise neurovascular intact in the right upper extremity.  Plain radiographs confirm advanced osteoarthritis with complete loss of joint space, subchondral sclerosis, and peripheral osteophyte formation.  Previous MRI scan shows severe rotator cuff degeneration and attenuation.  Vitals  Temp:  [98 F (36.7 C)] 98 F (36.7 C) (10/26 1251) Pulse Rate:  [74] 74 (10/26 1251) Resp:  [16] 16 (10/26 1251) BP: (196)/(98) 196/98 (10/26 1251) SpO2:  [99 %] 99 % (10/26 1251)  Assessment/Plan  Impression: Right shoulder rotator cuff tear arthropathy  Plan of Action: Procedure(s): REVERSE SHOULDER ARTHROPLASTY  Cheyanna Strick M Casaundra Takacs 01/03/2022, 1:10 PM Contact # [phone]

## 2022-01-03 NOTE — Anesthesia Procedure Notes (Signed)
Anesthesia Regional Block: Interscalene brachial plexus block   Pre-Anesthetic Checklist: , timeout performed,  Correct Patient, Correct Site, Correct Laterality,  Correct Procedure, Correct Position, site marked,  Risks and benefits discussed,  Surgical consent,  Pre-op evaluation,  At surgeon's request and post-op pain management  Laterality: Right  Prep: chloraprep       Needles:  Injection technique: Single-shot  Needle Type: Echogenic Stimulator Needle     Needle Length: 10cm  Needle Gauge: 21   Needle insertion depth: 6 cm   Additional Needles:   Procedures:,,,, ultrasound used (permanent image in chart),,   Motor weakness within 5 minutes.  Narrative:  Start time: 01/03/2022 1:40 PM End time: 01/03/2022 1:45 PM Injection made incrementally with aspirations every 5 mL.  Performed by: Personally  Anesthesiologist: Josephine Igo, MD  Additional Notes: Timeout performed. Patient sedated. Relevant anatomy ID'd using [place]. Incremental 2-39m injection of LA with frequent aspiration. Patient tolerated procedure well.     Right Interscalene Block

## 2022-01-03 NOTE — Anesthesia Procedure Notes (Signed)
Procedure Name: Intubation Date/Time: 01/03/2022 2:01 PM  Performed by: Maxwell Caul, CRNAPre-anesthesia Checklist: Patient identified, Emergency Drugs available, Suction available and Patient being monitored Patient Re-evaluated:Patient Re-evaluated prior to induction Oxygen Delivery Method: Circle system utilized Preoxygenation: Pre-oxygenation with 100% oxygen Induction Type: IV induction Ventilation: Mask ventilation without difficulty Laryngoscope Size: Mac and 4 Grade View: Grade I Tube type: Oral Tube size: 7.0 mm Number of attempts: 1 Airway Equipment and Method: Stylet Placement Confirmation: ETT inserted through vocal cords under direct vision, positive ETCO2 and breath sounds checked- equal and bilateral Secured at: 22 cm Tube secured with: Tape Dental Injury: Teeth and Oropharynx as per pre-operative assessment

## 2022-01-03 NOTE — Op Note (Signed)
01/03/2022  3:14 PM  PATIENT:   Zachary Murillo.  71 y.o. male  PRE-OPERATIVE DIAGNOSIS:  Right shoulder rotator cuff tear arthropathy  POST-OPERATIVE DIAGNOSIS: Same  PROCEDURE: Right shoulder reverse arthroplasty utilizing a press-fit size 12 Arthrex stem with a neutral metaphysis, +6 spacer, +3 polyethylene insert, 42/+4 glenosphere and a small/+2 baseplate  SURGEON:  Naja Apperson, Metta Clines M.D.  ASSISTANTS: Jenetta Loges, PA-C  Jenetta Loges, PA-C was utilized as an Environmental consultant throughout this case, essential for help with positioning the patient, positioning extremity, tissue manipulation, implantation of the prosthesis, suture management, wound closure, and intraoperative decision-making.  ANESTHESIA:   General endotracheal and interscalene block with Exparel  EBL: 200 cc  SPECIMEN: None  Drains: None   PATIENT DISPOSITION:  PACU - hemodynamically stable.    PLAN OF CARE: Discharge to home after PACU  Brief history:  Patient is a 71 year old male with chronic and progressive increasing right shoulder pain with profoundly restricted mobility related to severe osteoarthritis and rotator cuff degeneration.  Due to his increasing functional limitations and failure to respond to prolonged attempts at conservative management, he is brought to the operating room this time for planned right shoulder reverse arthroplasty.  Preoperatively, I counseled the patient regarding treatment options and risks versus benefits thereof.  Possible surgical complications were all reviewed including potential for bleeding, infection, neurovascular injury, persistent pain, loss of motion, anesthetic complication, failure of the implant, and possible need for additional surgery. They understand and accept and agrees with our planned procedure.   Procedure detail:  After undergoing routine preop evaluation the patient received prophylactic antibiotics and interscalene block with Exparel was established  in the holding area by the anesthesia department.  Subsequently placed spine on the operating table and underwent the smooth induction of a general endotracheal anesthesia.  Placed into the beachchair position and appropriately padded and protected.  The right shoulder girdle region was sterilely prepped and draped in standard fashion.  Timeout was called.  A deltopectoral approach to the right shoulder is made an approximately 10 cm incision.  Skin flaps elevated dissection carried deeply deltopectoral interval was developed from proximal to distal with the vein taken laterally.  Conjoined tendon was mobilized and retracted medially.  Long head biceps tendon was tenodesed at the upper border the pectoralis major tendon with proximal segment unroofed and excised.  The rotator cuff was then split from the apex of the bicipital groove to the base of the coracoid and the subscap was then separated from the lesser tuberosity using electrocautery the free margin was tagged with a pair of grasping suture tape sutures.  Capsular attachments were then divided from the anterior and infra margins of the humeral neck allowing deliver the humeral head through the wound.  An extra medullary guide was then used to outline a proposed humeral head resection which we performed with an oscillating saw at approximately 20 degrees of retroversion.  A rondure was then used to remove the large osteophytes on the margin of the neck.  A metal cap was placed over the cut proximal humeral surface size large.  The glenoid was then exposed and a circumferential labral resection was performed.  Glenoid guidepin was placed and the glenoid was then reamed with the central followed by the peripheral reamer for a 42 glenosphere.  Preparation included with the central drill and tapped for a 30 mm lag screw.  Our baseplate was then assembled and inserted with vancomycin powder applied to the threads of the lag  screw and excellent purchase and  fixation was achieved.  The peripheral locking screws were all then placed using standard technique with excellent fixation.  A 42/+4 glenosphere was then impacted onto the baseplate and the central locking screw was placed.  Our attention was then returned to the metaphysis where the canal was opened and broached up to a size 12 at 20 degrees of retroversion.  A neutral metaphyseal reaming guide was then used to repair the metaphysis.  A trial implant was placed and this showed good motion good stability and good soft tissue balance.  Our trial was removed.  The final implant was assembled.  It was then impacted after vancomycin powder was spread liberally into the humeral canal.  Final seating of the implant showed excellent fit and fixation.  A series of trial reductions was then performed and ultimately felt that a +6 spacer with +3 poly gave is the best motion stability and soft tissue balance.  At this point the implant was cleaned and dried the plastic spacer was placed followed by the +3 poly.  Our final reduction showed excellent motion stability and soft tissue balance all much to our satisfaction.  This point the wound was copious irrigated.  Final hemostasis was obtained.  Subscapularis was mobilized and repaired back to the eyelets on the collar the implant using the previously placed suture tape sutures.  The deltopectoral interval was reapproximated with a series of figure-of-eight number Vicryl sutures.  2-0 Monocryl used to close the subcu layer and intracuticular 3-0 Monocryl used to close the skin followed by Dermabond and Aquacel dressing.  The right arm was then placed into a sling and the patient was awakened, extubated, and taken to the recovery room in stable condition.  Marin Shutter MD   Contact # [phone]

## 2022-01-03 NOTE — Transfer of Care (Signed)
Immediate Anesthesia Transfer of Care Note  Patient: Zachary Murillo.  Procedure(s) Performed: REVERSE SHOULDER ARTHROPLASTY (Right: Shoulder)  Patient Location: PACU  Anesthesia Type:General  Level of Consciousness: awake, alert  and oriented  Airway & Oxygen Therapy: Patient Spontanous Breathing and Patient connected to face mask oxygen  Post-op Assessment: Report given to RN and Post -op Vital signs reviewed and stable  Post vital signs: Reviewed and stable  Last Vitals:  Vitals Value Taken Time  BP 187/95   Temp    Pulse 68 01/03/22 1544  Resp 13 01/03/22 1544  SpO2 100 % 01/03/22 1544  Vitals shown include unvalidated device data.  Last Pain:  Vitals:   01/03/22 1345  TempSrc:   PainSc: 0-No pain         Complications: No notable events documented.

## 2022-01-04 ENCOUNTER — Encounter (HOSPITAL_COMMUNITY): Payer: Self-pay | Admitting: Orthopedic Surgery

## 2022-01-09 DIAGNOSIS — E669 Obesity, unspecified: Secondary | ICD-10-CM | POA: Diagnosis not present

## 2022-01-09 DIAGNOSIS — I152 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: Secondary | ICD-10-CM | POA: Diagnosis not present

## 2022-01-09 DIAGNOSIS — E269 Hyperaldosteronism, unspecified: Secondary | ICD-10-CM | POA: Diagnosis not present

## 2022-01-14 DIAGNOSIS — Z96611 Presence of right artificial shoulder joint: Secondary | ICD-10-CM | POA: Diagnosis not present

## 2022-01-14 DIAGNOSIS — Z471 Aftercare following joint replacement surgery: Secondary | ICD-10-CM | POA: Diagnosis not present

## 2022-01-14 DIAGNOSIS — Z96619 Presence of unspecified artificial shoulder joint: Secondary | ICD-10-CM | POA: Diagnosis not present

## 2022-01-15 DIAGNOSIS — K582 Mixed irritable bowel syndrome: Secondary | ICD-10-CM | POA: Diagnosis not present

## 2022-01-16 ENCOUNTER — Other Ambulatory Visit: Payer: Self-pay | Admitting: "Endocrinology

## 2022-01-16 ENCOUNTER — Telehealth: Payer: Self-pay | Admitting: "Endocrinology

## 2022-01-16 NOTE — Telephone Encounter (Signed)
Pt called with high BG readings.   Date Before breakfast Before lunch Before supper Bedtime  11/5 211      11/6 178 before he took trulicity/metformin     11/7 155 112 161   11/8 180       He is losing weight more so than what he was before. Please Advise. He went to see his GI Doctor and they added Colestipol HCL- '1MG'$ , for his Diarrhea. He is taking 1 a day.

## 2022-01-16 NOTE — Telephone Encounter (Signed)
Discussed with pt's wife, understanding voiced. Appointment scheduled for January 10th at 11:00a.m.

## 2022-01-25 ENCOUNTER — Other Ambulatory Visit: Payer: Self-pay | Admitting: "Endocrinology

## 2022-01-28 DIAGNOSIS — M25611 Stiffness of right shoulder, not elsewhere classified: Secondary | ICD-10-CM | POA: Diagnosis not present

## 2022-01-28 DIAGNOSIS — M25511 Pain in right shoulder: Secondary | ICD-10-CM | POA: Diagnosis not present

## 2022-01-30 DIAGNOSIS — M25511 Pain in right shoulder: Secondary | ICD-10-CM | POA: Diagnosis not present

## 2022-01-30 DIAGNOSIS — M25611 Stiffness of right shoulder, not elsewhere classified: Secondary | ICD-10-CM | POA: Diagnosis not present

## 2022-02-05 DIAGNOSIS — M25611 Stiffness of right shoulder, not elsewhere classified: Secondary | ICD-10-CM | POA: Diagnosis not present

## 2022-02-05 DIAGNOSIS — M25511 Pain in right shoulder: Secondary | ICD-10-CM | POA: Diagnosis not present

## 2022-02-07 ENCOUNTER — Other Ambulatory Visit: Payer: Self-pay

## 2022-02-07 DIAGNOSIS — E1159 Type 2 diabetes mellitus with other circulatory complications: Secondary | ICD-10-CM

## 2022-02-07 MED ORDER — ACCU-CHEK GUIDE VI STRP: ORAL_STRIP | 2 refills | Status: DC

## 2022-02-08 DIAGNOSIS — M25511 Pain in right shoulder: Secondary | ICD-10-CM | POA: Diagnosis not present

## 2022-02-08 DIAGNOSIS — M25611 Stiffness of right shoulder, not elsewhere classified: Secondary | ICD-10-CM | POA: Diagnosis not present

## 2022-02-12 DIAGNOSIS — M25611 Stiffness of right shoulder, not elsewhere classified: Secondary | ICD-10-CM | POA: Diagnosis not present

## 2022-02-12 DIAGNOSIS — M25511 Pain in right shoulder: Secondary | ICD-10-CM | POA: Diagnosis not present

## 2022-02-14 ENCOUNTER — Other Ambulatory Visit: Payer: Self-pay | Admitting: "Endocrinology

## 2022-02-14 ENCOUNTER — Telehealth: Payer: Self-pay | Admitting: "Endocrinology

## 2022-02-14 DIAGNOSIS — E1159 Type 2 diabetes mellitus with other circulatory complications: Secondary | ICD-10-CM

## 2022-02-14 MED ORDER — TRULICITY 1.5 MG/0.5ML ~~LOC~~ SOAJ: 1.5000 mg | SUBCUTANEOUS | 2 refills | Status: DC

## 2022-02-14 NOTE — Telephone Encounter (Signed)
New  message    Patient wife calling C/o blood sugar reading. Asking for a call back to discuss   12/1 morning 161 @ 7:30am lunch 152 dinner 180 bedtime  170  12/2 morning 188 lunch 140 dinner 170 bedtime 188  12/3 morning 182 lunch 160 dinner 148 bedtime 183  12/4 morning 185 lunch 150 dinner 133 bedtime 140  12/5 morning  180 lunch 140 dinner 186 bedtime 170  12/6 morning 140 lunch - skip dinner 172 bedtime 182  12/7 morning 192

## 2022-02-15 DIAGNOSIS — M25511 Pain in right shoulder: Secondary | ICD-10-CM | POA: Diagnosis not present

## 2022-02-15 DIAGNOSIS — M25611 Stiffness of right shoulder, not elsewhere classified: Secondary | ICD-10-CM | POA: Diagnosis not present

## 2022-02-15 NOTE — Telephone Encounter (Signed)
Left a message requesting pt's wife to return call to the office.

## 2022-02-15 NOTE — Telephone Encounter (Signed)
Discussed with pt's wife, she stated pt has been off of trulicity approximately a month due to experiencing diarrhea. He is now seeing GI because symptoms are better but has not gone away. They are hesitant to restart trulicity and asked if there is another option.

## 2022-02-18 MED ORDER — ACCU-CHEK GUIDE VI STRP: ORAL_STRIP | 2 refills | Status: AC

## 2022-02-18 NOTE — Telephone Encounter (Signed)
Discussed with pt's wife, understanding voiced. 

## 2022-02-19 DIAGNOSIS — M25511 Pain in right shoulder: Secondary | ICD-10-CM | POA: Diagnosis not present

## 2022-02-19 DIAGNOSIS — M25611 Stiffness of right shoulder, not elsewhere classified: Secondary | ICD-10-CM | POA: Diagnosis not present

## 2022-02-22 DIAGNOSIS — M25511 Pain in right shoulder: Secondary | ICD-10-CM | POA: Diagnosis not present

## 2022-02-22 DIAGNOSIS — M25611 Stiffness of right shoulder, not elsewhere classified: Secondary | ICD-10-CM | POA: Diagnosis not present

## 2022-02-26 DIAGNOSIS — M25511 Pain in right shoulder: Secondary | ICD-10-CM | POA: Diagnosis not present

## 2022-02-26 DIAGNOSIS — M25611 Stiffness of right shoulder, not elsewhere classified: Secondary | ICD-10-CM | POA: Diagnosis not present

## 2022-02-28 DIAGNOSIS — M25611 Stiffness of right shoulder, not elsewhere classified: Secondary | ICD-10-CM | POA: Diagnosis not present

## 2022-02-28 DIAGNOSIS — M25511 Pain in right shoulder: Secondary | ICD-10-CM | POA: Diagnosis not present

## 2022-03-05 DIAGNOSIS — M25511 Pain in right shoulder: Secondary | ICD-10-CM | POA: Diagnosis not present

## 2022-03-05 DIAGNOSIS — M25611 Stiffness of right shoulder, not elsewhere classified: Secondary | ICD-10-CM | POA: Diagnosis not present

## 2022-03-11 HISTORY — PX: PROSTATE BIOPSY: SHX241

## 2022-03-20 ENCOUNTER — Ambulatory Visit: Payer: Medicare PPO | Admitting: "Endocrinology

## 2022-03-21 LAB — COMPREHENSIVE METABOLIC PANEL
ALT: 23 IU/L (ref 0–44)
AST: 23 IU/L (ref 0–40)
Albumin/Globulin Ratio: 1.9 (ref 1.2–2.2)
Albumin: 4.2 g/dL (ref 3.8–4.8)
Alkaline Phosphatase: 169 IU/L — ABNORMAL HIGH (ref 44–121)
BUN/Creatinine Ratio: 20 (ref 10–24)
BUN: 18 mg/dL (ref 8–27)
Bilirubin Total: 0.7 mg/dL (ref 0.0–1.2)
CO2: 25 mmol/L (ref 20–29)
Calcium: 9.1 mg/dL (ref 8.6–10.2)
Chloride: 100 mmol/L (ref 96–106)
Creatinine, Ser: 0.92 mg/dL (ref 0.76–1.27)
Globulin, Total: 2.2 g/dL (ref 1.5–4.5)
Glucose: 139 mg/dL — ABNORMAL HIGH (ref 70–99)
Potassium: 3.5 mmol/L (ref 3.5–5.2)
Sodium: 140 mmol/L (ref 134–144)
Total Protein: 6.4 g/dL (ref 6.0–8.5)
eGFR: 88 mL/min/{1.73_m2} (ref >59)

## 2022-03-21 LAB — LIPID PANEL
Chol/HDL Ratio: 2.4 ratio (ref 0.0–5.0)
Cholesterol, Total: 116 mg/dL (ref 100–199)
HDL: 49 mg/dL (ref >39)
LDL Chol Calc (NIH): 53 mg/dL (ref 0–99)
Triglycerides: 69 mg/dL (ref 0–149)
VLDL Cholesterol Cal: 14 mg/dL (ref 5–40)

## 2022-03-25 ENCOUNTER — Encounter: Payer: Self-pay | Admitting: "Endocrinology

## 2022-03-25 ENCOUNTER — Encounter: Payer: Self-pay | Admitting: Urology

## 2022-03-25 ENCOUNTER — Ambulatory Visit: Payer: Medicare PPO | Admitting: Urology

## 2022-03-25 ENCOUNTER — Ambulatory Visit: Payer: Medicare PPO | Admitting: "Endocrinology

## 2022-03-25 VITALS — BP 144/85 | HR 71 | Ht 70.0 in | Wt 196.0 lb

## 2022-03-25 VITALS — BP 106/68 | HR 72 | Ht 70.0 in | Wt 199.4 lb

## 2022-03-25 DIAGNOSIS — E782 Mixed hyperlipidemia: Secondary | ICD-10-CM

## 2022-03-25 DIAGNOSIS — R972 Elevated prostate specific antigen [PSA]: Secondary | ICD-10-CM | POA: Diagnosis not present

## 2022-03-25 DIAGNOSIS — N400 Enlarged prostate without lower urinary tract symptoms: Secondary | ICD-10-CM

## 2022-03-25 DIAGNOSIS — I10 Essential (primary) hypertension: Secondary | ICD-10-CM | POA: Diagnosis not present

## 2022-03-25 DIAGNOSIS — E559 Vitamin D deficiency, unspecified: Secondary | ICD-10-CM | POA: Diagnosis not present

## 2022-03-25 DIAGNOSIS — E1159 Type 2 diabetes mellitus with other circulatory complications: Secondary | ICD-10-CM

## 2022-03-25 LAB — MICROSCOPIC EXAMINATION
Bacteria, UA: NONE SEEN
RBC, Urine: NONE SEEN /hpf (ref 0–2)

## 2022-03-25 LAB — POCT GLYCOSYLATED HEMOGLOBIN (HGB A1C): HbA1c, POC (controlled diabetic range): 7.5 % — AB (ref 0.0–7.0)

## 2022-03-25 LAB — URINALYSIS, ROUTINE W REFLEX MICROSCOPIC
Bilirubin, UA: NEGATIVE
Glucose, UA: NEGATIVE
Ketones, UA: NEGATIVE
Nitrite, UA: NEGATIVE
RBC, UA: NEGATIVE
Specific Gravity, UA: 1.015 (ref 1.005–1.030)
Urobilinogen, Ur: 0.2 mg/dL (ref 0.2–1.0)
pH, UA: 6 (ref 5.0–7.5)

## 2022-03-25 MED ORDER — GLIPIZIDE ER 5 MG PO TB24: 5.0000 mg | ORAL_TABLET | Freq: Every day | ORAL | 1 refills | Status: DC

## 2022-03-25 NOTE — Patient Instructions (Signed)
                                     Advice for Weight Management  -For most of us the best way to lose weight is by diet management. Generally speaking, diet management means consuming less calories intentionally which over time brings about progressive weight loss.  This can be achieved more effectively by avoiding ultra processed carbohydrates, processed meats, unhealthy fats.    It is critically important to know your numbers: how much calorie you are consuming and how much calorie you need. More importantly, our carbohydrates sources should be unprocessed naturally occurring  complex starch food items.  It is always important to balance nutrition also by  appropriate intake of proteins (mainly plant-based), healthy fats/oils, plenty of fruits and vegetables.   -The American College of Lifestyle Medicine (ACL M) recommends nutrition derived mostly from Whole Food, Plant Predominant Sources example an apple instead of applesauce or apple pie. Eat Plenty of vegetables, Mushrooms, fruits, Legumes, Whole Grains, Nuts, seeds in lieu of processed meats, processed snacks/pastries red meat, poultry, eggs.  Use only water or unsweetened tea for hydration.  The College also recommends the need to stay away from risky substances including alcohol, smoking; obtaining 7-9 hours of restorative sleep, at least 150 minutes of moderate intensity exercise weekly, importance of healthy social connections, and being mindful of stress and seek help when it is overwhelming.    -Sticking to a routine mealtime to eat 3 meals a day and avoiding unnecessary snacks is shown to have a big role in weight control. Under normal circumstances, the only time we burn stored energy is when we are hungry, so allow  some hunger to take place- hunger means no food between appropriate meal times, only water.  It is not advisable to starve.   -It is better to avoid simple carbohydrates including:  Cakes, Sweet Desserts, Ice Cream, Soda (diet and regular), Sweet Tea, Candies, Chips, Cookies, Store Bought Juices, Alcohol in Excess of  1-2 drinks a day, Lemonade,  Artificial Sweeteners, Doughnuts, Coffee Creamers, "Sugar-free" Products, etc, etc.  This is not a complete list.....    -Consulting with certified diabetes educators is proven to provide you with the most accurate and current information on diet.  Also, you may be  interested in discussing diet options/exchanges , we can schedule a visit with Zachary Murillo, RDN, CDE for individualized nutrition education.  -Exercise: If you are able: 30 -60 minutes a day ,4 days a week, or 150 minutes of moderate intensity exercise weekly.    The longer the better if tolerated.  Combine stretch, strength, and aerobic activities.  If you were told in the past that you have high risk for cardiovascular diseases, or if you are currently symptomatic, you may seek evaluation by your heart doctor prior to initiating moderate to intense exercise programs.                                  Additional Care Considerations for Diabetes/Prediabetes   -Diabetes  is a chronic disease.  The most important care consideration is regular follow-up with your diabetes care provider with the goal being avoiding or delaying its complications and to take advantage of advances in medications and technology.  If appropriate actions are taken early enough, type 2 diabetes can even be   reversed.  Seek information from the right source.  - Whole Food, Plant Predominant Nutrition is highly recommended: Eat Plenty of vegetables, Mushrooms, fruits, Legumes, Whole Grains, Nuts, seeds in lieu of processed meats, processed snacks/pastries red meat, poultry, eggs as recommended by American College of  Lifestyle Medicine (ACLM).  -Type 2 diabetes is known to coexist with other important comorbidities such as high blood pressure and high cholesterol.  It is critical to control not only the  diabetes but also the high blood pressure and high cholesterol to minimize and delay the risk of complications including coronary artery disease, stroke, amputations, blindness, etc.  The good news is that this diet recommendation for type 2 diabetes is also very helpful for managing high cholesterol and high blood blood pressure.  - Studies showed that people with diabetes will benefit from a class of medications known as ACE inhibitors and statins.  Unless there are specific reasons not to be on these medications, the standard of care is to consider getting one from these groups of medications at an optimal doses.  These medications are generally considered safe and proven to help protect the heart and the kidneys.    - People with diabetes are encouraged to initiate and maintain regular follow-up with eye doctors, foot doctors, dentists , and if necessary heart and kidney doctors.     - It is highly recommended that people with diabetes quit smoking or stay away from smoking, and get yearly  flu vaccine and pneumonia vaccine at least every 5 years.  See above for additional recommendations on exercise, sleep, stress management , and healthy social connections.      

## 2022-03-25 NOTE — Progress Notes (Signed)
03/25/2022 9:09 AM   Zachary Murillo. 1951-01-06 [id number]  Referring provider: Johna Roles, [address] [place],  [place] [address]  No chief complaint on file.   HPI:  F/u -    1) PSA elevation-his PSA in October 2022 was 5.1. This rose when rechecked April 2023 to 7.1.  His May 2023 DRE was benign. His Aug 2023 PSA was 7.4. Aug 2023 prostate MRI was benign, 83 gram prostate.   No prostate bx. No FH of PCa - maybe dad.    He is on Plavix - for CVA.  He has a cardiac loop. He had a shoulder MRI. He stopped plavix for rt shoulder surgery. PCP now renews it.   2) BPH - prostate was 83 g on MRI in Aug 2023. No meds or surgery. No prior history of BPH.  AUA symptom score is 2. He is on terazosin 10 mg.   Zachary Murillo is seen today for the above. No voiding complaints.   He was Higher education careers adviser at [employer].    PMH: Past Medical History:  Diagnosis Date   Acute ischemic stroke ([place]) 02/22/2013   CVA (cerebral infarction) 01/22/2013   Diabetes mellitus ([place])    Difficulty in walking(719.7) 01/25/2013   History of Roux-en-Y gastric bypass    Hypertension    IMPINGEMENT SYNDROME 01/16/2009   Qualifier: Diagnosis of  By: Aline Brochure MD, Dorothyann Peng     KNEE PAIN 01/16/2009   Qualifier: Diagnosis of  By: Aline Brochure MD, [place]     Knee pain, right 09/05/2011   KNEE, ARTHRITIS, DEGEN./OSTEO 01/30/2009   Qualifier: Diagnosis of  By: Aline Brochure MD, Stanley     Lack of coordination 01/22/2013   Non-ischemic cardiomyopathy ([place])    Osteoarthritis of knee    Other obesity due to excess calories 01/30/2015   Overweight(278.02)    Pulmonary hypertension ([place])    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 12/2014, Dr. Flavia Shipper   S/P lumbar fusion 07/02/2013   SHOULDER PAIN 01/16/2009   Qualifier: Diagnosis of  By: Aline Brochure MD, [place]     Shoulder pain, right    Sleep apnea    cpap   Sleep apnea    Spinal stenosis     Stroke [facility])    TENDINITIS, LEFT KNEE 01/16/2009   Qualifier: Diagnosis of  By: Aline Brochure MD, Arlana Hove D deficiency     Surgical History: Past Surgical History:  Procedure Laterality Date   BACK SURGERY     CARDIAC CATHETERIZATION     gastric bypass surgery      KNEE ARTHROSCOPY     loop recorder  12/01/2018   Per [facility] Chris-pt is unsafe to have any MRI-Loop recorder is end of life   10/01/2021   NASAL SEPTUM SURGERY     REVERSE SHOULDER ARTHROPLASTY Right 01/03/2022   Procedure: REVERSE SHOULDER ARTHROPLASTY;  Surgeon: Justice Britain, MD;  Location: [facility];  Service: Orthopedics;  Laterality: Right;    Home Medications:  Allergies as of 03/25/2022       Reactions   Prednisone    Starts itching        Medication List        Accurate as of March 25, 2022  9:09 AM. If you have any questions, ask your nurse or doctor.          Accu-Chek Guide Me w/Device Kit 1 Piece by Does  not apply route as directed.   Accu-Chek Guide test strip Generic drug: glucose blood USE TO TEST BLOOD SUGAR FOUR TIMES DAILY. E11.65   Align 4 MG Caps Take 4 mg by mouth daily at 2 am.   ascorbic acid 500 MG tablet Commonly known as: VITAMIN C Take 500 mg by mouth daily.   atorvastatin 10 MG tablet Commonly known as: LIPITOR TAKE (1) TABLET BY MOUTH AT BEDTIME.   CALCIUM + D PO Take 600 mg by mouth 2 (two) times daily.   CENTRUM PO Take 1 tablet by mouth every morning. [place] chewable with extra iron   cetirizine 10 MG tablet Commonly known as: ZYRTEC Take 10 mg by mouth daily as needed for allergies.   clopidogrel 75 MG tablet Commonly known as: PLAVIX Take 1 tablet (75 mg total) by mouth daily.   cyclobenzaprine 10 MG tablet Commonly known as: FLEXERIL Take 1 tablet (10 mg total) by mouth 3 (three) times daily as needed for muscle spasms.   fluticasone 50 MCG/ACT nasal spray Commonly known as: FLONASE Place 2 sprays into both nostrils daily as  needed for allergies.   hydrochlorothiazide 12.5 MG tablet Commonly known as: HYDRODIURIL Take 12.5 mg by mouth daily.   lisinopril 40 MG tablet Commonly known as: ZESTRIL Take 40 mg by mouth every morning.   metFORMIN 500 MG tablet Commonly known as: GLUCOPHAGE Take 1 tablet (500 mg total) by mouth 2 (two) times daily with a meal.   METHYLCELLULOSE (LAXATIVE) PO Take 1 packet by mouth daily. 15 ml   ondansetron 4 MG tablet Commonly known as: Zofran Take 1 tablet (4 mg total) by mouth every 8 (eight) hours as needed for nausea or vomiting.   oxyCODONE-acetaminophen 5-325 MG tablet Commonly known as: Percocet Take 1 tablet by mouth every 4 (four) hours as needed (max 6 q).   potassium chloride SA 20 MEQ tablet Commonly known as: KLOR-CON M Take 1 tablet (20 mEq total) by mouth daily.   sertraline 50 MG tablet Commonly known as: ZOLOFT Take 1 tablet (50 mg total) by mouth daily. What changed: how much to take   Trulicity 1.5 XN/1.7GY Sopn Generic drug: Dulaglutide Inject 1.5 mg into the skin once a week.   Vitamin D 125 MCG (5000 UT) Caps Take 5,000 Units by mouth daily.   zinc gluconate 50 MG tablet Take 50 mg by mouth daily.        Allergies:  Allergies  Allergen Reactions   Prednisone     Starts itching    Family History: Family History  Problem Relation Age of Onset   Heart disease Other    Arthritis Other    Cancer Other    Asthma Other    Diabetes Other    Breast cancer Mother    Heart disease Mother    Heart disease Father    Stroke Father    Cancer Sister    Heart disease Brother    COPD Sister     Social History:  reports that he has never smoked. He has never used smokeless tobacco. He reports current alcohol use. He reports that he does not use drugs.   Physical Exam: BP (!) 144/85   Pulse 71   Ht '5\' 10"'$  (1.778 m)   Wt 196 lb (88.9 kg)   BMI 28.12 kg/m   Constitutional:  Alert and oriented, No acute distress. HEENT: [place] AT,  moist mucus membranes.  Trachea midline, no masses. Cardiovascular: No clubbing, cyanosis, or edema. Respiratory:  Normal respiratory effort, no increased work of breathing. GI: Abdomen is soft, nontender, nondistended, no abdominal masses GU: No CVA tenderness Skin: No rashes, bruises or suspicious lesions. Neurologic: Grossly intact, no focal deficits, moving all 4 extremities. Psychiatric: Normal mood and affect.  Laboratory Data: Lab Results  Component Value Date   WBC 8.6 12/25/2021   HGB 14.7 12/25/2021   HCT 46.4 12/25/2021   MCV 86.9 12/25/2021   PLT 228 12/25/2021    Lab Results  Component Value Date   CREATININE 0.92 03/20/2022    No results found for: "PSA"  No results found for: "TESTOSTERONE"  Lab Results  Component Value Date   HGBA1C 7.3 (H) 12/25/2021    Urinalysis    Component Value Date/Time   APPEARANCEUR Clear 11/26/2021 1038   GLUCOSEU Negative 11/26/2021 1038   BILIRUBINUR Negative 11/26/2021 1038   PROTEINUR Negative 11/26/2021 1038   NITRITE Negative 11/26/2021 1038   LEUKOCYTESUR Negative 11/26/2021 1038    Lab Results  Component Value Date   LABMICR Comment 11/26/2021    Pertinent Imaging: MRI prostate - Aug 2023   Results for orders placed during the hospital encounter of 10/04/21  DG Abd 1 View  Narrative CLINICAL DATA:  Alternating constipation and diarrhea for several months.  EXAM: ABDOMEN - 1 VIEW  COMPARISON:  June 07, 2019.  FINDINGS: The bowel gas pattern is normal. No significant stool burden is noted. No radio-opaque calculi or other significant radiographic abnormality are seen.  IMPRESSION: No abnormal bowel dilatation is noted.   Electronically Signed By: Marijo Conception M.D. On: 10/05/2021 12:40    Assessment & Plan:    1. Elevated PSA Disc again nature r/b of PSA screening and PSA elevation etiology. PSA was sent. Consider biopsy.   - Urinalysis, Routine w reflex microscopic  2. BPH - cont  alpha blocker   No follow-ups on file.  Festus Aloe, MD  [facility]  [address] [place], [place] [address] [phone]

## 2022-03-25 NOTE — Progress Notes (Signed)
03/25/2022  Endocrinology follow-up note   Subjective:    Patient ID: Zachary Murillo., male    DOB: 1950/04/20, PCP Johna Roles, PA   Past Medical History:  Diagnosis Date   Acute ischemic stroke ([place]) 02/22/2013   CVA (cerebral infarction) 01/22/2013   Diabetes mellitus ([place])    Difficulty in walking(719.7) 01/25/2013   History of Roux-en-Y gastric bypass    Hypertension    IMPINGEMENT SYNDROME 01/16/2009   Qualifier: Diagnosis of  By: Aline Brochure MD, Dorothyann Peng     KNEE PAIN 01/16/2009   Qualifier: Diagnosis of  By: Aline Brochure MD, Stanley     Knee pain, right 09/05/2011   KNEE, ARTHRITIS, DEGEN./OSTEO 01/30/2009   Qualifier: Diagnosis of  By: Aline Brochure MD, Stanley     Lack of coordination 01/22/2013   Non-ischemic cardiomyopathy ([place])    Osteoarthritis of knee    Other obesity due to excess calories 01/30/2015   Overweight(278.02)    Pulmonary hypertension ([place])    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 12/2014, Dr. Flavia Shipper   S/P lumbar fusion 07/02/2013   SHOULDER PAIN 01/16/2009   Qualifier: Diagnosis of  By: Aline Brochure MD, Stanley     Shoulder pain, right    Sleep apnea    cpap   Sleep apnea    Spinal stenosis    Stroke [facility])    TENDINITIS, LEFT KNEE 01/16/2009   Qualifier: Diagnosis of  By: Aline Brochure MD, Arlana Hove D deficiency    Past Surgical History:  Procedure Laterality Date   BACK SURGERY     CARDIAC CATHETERIZATION     gastric bypass surgery      KNEE ARTHROSCOPY     loop recorder  12/01/2018   Per [facility] Rep Chris-pt is unsafe to have any MRI-Loop recorder is end of life   10/01/2021   NASAL SEPTUM SURGERY     REVERSE SHOULDER ARTHROPLASTY Right 01/03/2022   Procedure: REVERSE SHOULDER ARTHROPLASTY;  Surgeon: Justice Britain, MD;  Location: [facility];  Service: Orthopedics;  Laterality: Right;   Social History   Socioeconomic History   Marital status: Married    Spouse name: Otila Kluver     Number of children: 1   Years of education: Masters   Highest education level: Not on file  Occupational History   Occupation: Retired-Drivers Ed   Tobacco Use   Smoking status: Never   Smokeless tobacco: Never  Vaping Use   Vaping Use: Never used  Substance and Sexual Activity   Alcohol use: Yes    Comment: occ   Drug use: No   Sexual activity: Yes    Birth control/protection: Surgical  Other Topics Concern   Not on file  Social History Narrative   Lives with wife-42 years   One daughter who lives in [place] grandchildren, one expecting      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, Warehouse manager racing, fishing-Cubs and Curator      Diet: eats all food groups   Caffeine: 2 cups of coffee daily, tea every other day   Water: 2-3 cups daily      Wears seat belt   Does not use phone while driving    Oceanographer             Social Determinants of Health   Financial Resource Strain: Not on file  Food Insecurity: Not on file  Transportation Needs: Not on file  Physical Activity:  Not on file  Stress: Not on file  Social Connections: Not on file   Outpatient Encounter Medications as of 03/25/2022  Medication Sig   glipiZIDE (GLUCOTROL XL) 5 MG 24 hr tablet Take 1 tablet (5 mg total) by mouth daily with breakfast.   ascorbic acid (VITAMIN C) 500 MG tablet Take 500 mg by mouth daily.   atorvastatin (LIPITOR) 10 MG tablet TAKE (1) TABLET BY MOUTH AT BEDTIME.   Blood Glucose Monitoring Suppl (ACCU-CHEK GUIDE ME) w/Device KIT 1 Piece by Does not apply route as directed.   Calcium Citrate-Vitamin D (CALCIUM + D PO) Take 600 mg by mouth 2 (two) times daily.   cetirizine (ZYRTEC) 10 MG tablet Take 10 mg by mouth daily as needed for allergies.    Cholecalciferol (VITAMIN D) 125 MCG (5000 UT) CAPS Take 5,000 Units by mouth daily.   clopidogrel (PLAVIX) 75 MG tablet Take 1 tablet (75 mg total) by mouth daily.   colestipol (COLESTID) 1 g tablet Take 1 g by mouth 2  (two) times daily.   Dulaglutide (TRULICITY) 1.5 HC/6.2BJ SOPN Inject 1.5 mg into the skin once a week.   fluticasone (FLONASE) 50 MCG/ACT nasal spray Place 2 sprays into both nostrils daily as needed for allergies.   glucose blood (ACCU-CHEK GUIDE) test strip USE TO TEST BLOOD SUGAR FOUR TIMES DAILY. E11.65   hydrochlorothiazide (HYDRODIURIL) 12.5 MG tablet Take 12.5 mg by mouth daily.   lisinopril (PRINIVIL,ZESTRIL) 40 MG tablet Take 40 mg by mouth every morning.   metFORMIN (GLUCOPHAGE) 500 MG tablet Take 1 tablet (500 mg total) by mouth 2 (two) times daily with a meal.   Multiple Vitamins-Minerals (CENTRUM PO) Take 1 tablet by mouth every morning. [place] chewable with extra iron   potassium chloride SA (KLOR-CON M) 20 MEQ tablet Take 1 tablet (20 mEq total) by mouth daily.   sertraline (ZOLOFT) 50 MG tablet Take 1 tablet (50 mg total) by mouth daily. (Patient taking differently: Take 75 mg by mouth daily.)   zinc gluconate 50 MG tablet Take 50 mg by mouth daily.   [DISCONTINUED] cyclobenzaprine (FLEXERIL) 10 MG tablet Take 1 tablet (10 mg total) by mouth 3 (three) times daily as needed for muscle spasms. (Patient not taking: Reported on 03/25/2022)   [DISCONTINUED] METHYLCELLULOSE, LAXATIVE, PO Take 1 packet by mouth daily. 15 ml   [DISCONTINUED] ondansetron (ZOFRAN) 4 MG tablet Take 1 tablet (4 mg total) by mouth every 8 (eight) hours as needed for nausea or vomiting. (Patient not taking: Reported on 03/25/2022)   [DISCONTINUED] oxyCODONE-acetaminophen (PERCOCET) 5-325 MG tablet Take 1 tablet by mouth every 4 (four) hours as needed (max 6 q). (Patient not taking: Reported on 03/25/2022)   [DISCONTINUED] Probiotic Product (ALIGN) 4 MG CAPS Take 4 mg by mouth daily at 2 am. (Patient not taking: Reported on 03/25/2022)   No facility-administered encounter medications on file as of 03/25/2022.   ALLERGIES: Allergies  Allergen Reactions   Prednisone     Starts itching   VACCINATION  STATUS: Immunization History  Administered Date(s) Administered   Influenza, High Dose Seasonal PF 11/29/2016, 01/20/2018   Influenza, Seasonal, Injecte, Preservative Fre 12/27/2012   Influenza,inj,Quad PF,6+ Mos 01/06/2015   Influenza-Unspecified 12/23/2013, 12/25/2015, 11/19/2018   PFIZER(Purple Top)SARS-COV-2 Vaccination 04/03/2019, 04/24/2019, 12/30/2019   Zoster, Live 04/25/2014    Diabetes He presents for his follow-up diabetic visit. He has type 2 diabetes mellitus. Onset time: He was diagnosed at approximate age of 55 years. His disease course has been worsening. There  are no hypoglycemic associated symptoms. Pertinent negatives for hypoglycemia include no confusion, headaches, pallor or seizures. Pertinent negatives for diabetes include no chest pain, no fatigue, no polydipsia, no polyphagia, no polyuria and no weakness. There are no hypoglycemic complications. Symptoms are worsening. Diabetic complications include a CVA and heart disease. Risk factors for coronary artery disease include diabetes mellitus, dyslipidemia, hypertension, male sex, obesity and sedentary lifestyle. Current diabetic treatment includes oral agent (monotherapy) (Trulicity 1.5 weekly). He is compliant with treatment all of the time. His weight is fluctuating minimally. He has had a previous visit with a dietitian. He participates in exercise intermittently. His home blood glucose trend is fluctuating minimally. His breakfast blood glucose range is generally 130-140 mg/dl. His bedtime blood glucose range is generally 140-180 mg/dl. His overall blood glucose range is 140-180 mg/dl. [name] presents with his meter showing slightly above target glycemic profile confirmed by reviewing his logs.  His point-of-care A1c is 7.5%, increasing from 7.1%.  He did not tolerate Trulicity 3 mg, lowered to 1.5 mg in the interim.  He is also on metformin 500 mg p.o. 3 times daily.   ) An ACE inhibitor/angiotensin II receptor blocker is being  taken. He sees a podiatrist.Eye exam is current.  Hyperlipidemia This is a chronic problem. The current episode started more than 1 year ago. The problem is controlled. Recent lipid tests were reviewed and are normal. Exacerbating diseases include chronic renal disease, diabetes and obesity. Pertinent negatives include no chest pain, myalgias or shortness of breath. Current antihyperlipidemic treatment includes statins. The current treatment provides moderate improvement of lipids. Compliance problems include adherence to exercise.  Risk factors for coronary artery disease include diabetes mellitus, dyslipidemia, hypertension, male sex, a sedentary lifestyle, obesity and family history.  Hypertension This is a chronic problem. The current episode started more than 1 year ago. The problem has been gradually improving since onset. The problem is controlled. Pertinent negatives include no chest pain, headaches, neck pain, palpitations or shortness of breath. There are no associated agents to hypertension. Risk factors for coronary artery disease include diabetes mellitus, dyslipidemia, male gender, obesity and sedentary lifestyle. Past treatments include ACE inhibitors, diuretics and calcium channel blockers. The current treatment provides moderate improvement. Compliance problems include exercise and diet.  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 Identifiable causes of hypertension include chronic renal disease and hyperaldosteronism.    Review of systems  Constitutional: + Lost 4 pounds since last visit, current Body mass index is 28.61 kg/m. , no fatigue, no subjective hyperthermia, no subjective hypothermia    Objective:    BP 106/68   Pulse 72   Ht '5\' 10"'$  (1.778 m)   Wt 199 lb 6.4 oz (90.4 kg)   BMI 28.61 kg/m   Wt Readings from Last 3 Encounters:  03/25/22 199 lb 6.4 oz (90.4 kg)  03/25/22 196 lb (88.9 kg)  01/03/22 202 lb 13.2 oz (92 kg)    BP Readings from Last 3  Encounters:  03/25/22 106/68  03/25/22 (!) 144/85  01/03/22 (!) 164/85     Physical Exam- Limited  Constitutional:  Body mass index is 28.61 kg/m. , not in acute distress, normal state of mind   Results for orders placed or performed in visit on 03/25/22  HgB A1c  Result Value Ref Range   Hemoglobin A1C     HbA1c POC (<> result, manual entry)     HbA1c, POC (prediabetic range)     HbA1c, POC (controlled diabetic range) 7.5 (A) 0.0 -  7.0 %   Diabetic Labs (most recent): Lab Results  Component Value Date   HGBA1C 7.5 (A) 03/25/2022   HGBA1C 7.3 (H) 12/25/2021   HGBA1C 7.1 (A) 12/20/2021      Latest Ref Rng & Units 03/20/2022    8:18 AM 12/25/2021    9:53 AM 12/17/2021    8:44 AM  CMP  Glucose 70 - 99 mg/dL 139  147  144   BUN 8 - 27 mg/dL '18  20  13   '$ Creatinine 0.76 - 1.27 mg/dL 0.92  0.84  0.82   Sodium 134 - 144 mmol/L 140  139  146   Potassium 3.5 - 5.2 mmol/L 3.5  3.8  3.3   Chloride 96 - 106 mmol/L 100  102  104   CO2 20 - 29 mmol/L '25  29  22   '$ Calcium 8.6 - 10.2 mg/dL 9.1  9.4  9.4   Total Protein 6.0 - 8.5 g/dL 6.4   7.0   Total Bilirubin 0.0 - 1.2 mg/dL 0.7   1.1   Alkaline Phos 44 - 121 IU/L 169   154   AST 0 - 40 IU/L 23   22   ALT 0 - 44 IU/L 23   28    Lipid Panel     Component Value Date/Time   CHOL 116 03/20/2022 0818   TRIG 69 03/20/2022 0818   HDL 49 03/20/2022 0818   CHOLHDL 2.4 03/20/2022 0818   CHOLHDL 2.2 01/04/2016 0745   VLDL 13 01/04/2016 0745   LDLCALC 53 03/20/2022 0818   LABVLDL 14 03/20/2022 0818     Assessment & Plan:   1) Type 2 diabetes mellitus with vascular disease ([place])  His diabetes is complicated by CVA and patient remains at a high risk for more acute and chronic complications of diabetes which include CAD, CVA, CKD, retinopathy, and neuropathy. He was diagnosed at approximate age of 55 years. [name] presents with his meter showing slightly above target glycemic profile confirmed by reviewing his logs.  His  point-of-care A1c is 7.5%, increasing from 7.1%.  He did not tolerate Trulicity 3 mg, lowered to 1.5 mg in the interim.  He is also on metformin 500 mg p.o. 3 times daily.    -He maintains 140 pounds of weight loss status post his Roux-en-Y bypass surgery.  He denies any hypoglycemia.    Recent labs reviewed. This patient continues to benefit from lifestyle medicine.    - he acknowledges that there is a room for improvement in his food and drink choices. - Suggestion is made for him to avoid simple carbohydrates  from his diet including Cakes, Sweet Desserts, Ice Cream, Soda (diet and regular), Sweet Tea, Candies, Chips, Cookies, Store Bought Juices, Alcohol , Artificial Sweeteners,  Coffee Creamer, and "Sugar-free" Products, Lemonade. This will help patient to have more stable blood glucose profile and potentially avoid unintended weight gain.  The following Lifestyle Medicine recommendations according to [place]  [facility]) were discussed and and offered to patient and he  agrees to start the journey:  A. Whole Foods, Plant-Based Nutrition comprising of fruits and vegetables, plant-based proteins, whole-grain carbohydrates was discussed in detail with the patient.   A list for source of those nutrients were also provided to the patient.  Patient will use only water or unsweetened tea for hydration. B.  The need to stay away from risky substances including alcohol, smoking; obtaining 7 to 9 hours of restorative sleep, at  least 150 minutes of moderate intensity exercise weekly, the importance of healthy social connections,  and stress management techniques were discussed. C.  A full color page of  Calorie density of various food groups per pound showing examples of each food groups was provided to the patient.    - Patient is advised to stick to a routine mealtimes to eat 3 meals  a day and avoid unnecessary snacks ( to snack only to correct hypoglycemia).  - I have  approached patient with the following individualized plan to manage diabetes and patient agrees.  - After a successful Roux-en-Y bariatric surgery and lost approximately 145 pounds, he was able to come off of multiple daily injections of insulin therapy. His presentation is consistent with above target glycemic profile he will need regional intervention since he is not tolerating the optimal dose of Trulicity.  He is advised to continue Trulicity at a lower dose of 1.5 mg subcutaneously weekly, advised to continue metformin 500 mg p.o. twice daily with breakfast and supper.  I discussed and added glipizide 5 mg XL p.o. daily at breakfast.   - He is advised to continue monitoring blood glucose at l at fasting and before going to bed, and advised to call clinic for hypoglycemia below 70 or fasting hyperglycemia greater than 150 mg/day) in a week.     - Patient specific target  for A1c; LDL, HDL, Triglycerides,  were discussed in detail.  2) BP/HTN:  -His blood pressure is controlled to target.  His current blood pressure medications include lisinopril 40 mg p.o. daily at breakfast..  3) Lipids/HPL:  His most recent lipid panel showed improved lipid profile with LDL at 53.   Side effects and precautions discussed with him.   4)  Weight/Diet:  His Body mass index is 28.61 kg/m. He is status post Roux-en-Y bariatric surgery.  He is still a candidate for modest weight loss.  The above detailed lifestyle medicine will help control weight, exercise, and carbohydrates information provided.  He is approached to reengage in routine physical activity to prevent further weight gain.  5) Chronic Care/Health Maintenance: -Patient is on ACEI/ARB and Statin medications and encouraged to continue to follow up with Ophthalmology, Podiatrist at least yearly or according to recommendations, and advised to  stay away from smoking. I have recommended yearly flu vaccine and pneumonia vaccination at least every 5 years;  moderate intensity exercise for up to 150 minutes weekly; and  sleep for at least 7 hours a day.  6) Vitamin D deficiency -His most recent vitamin D level on 08/06/2018 was 51.  He is advised to continue daily maintenance dose of vitamin D3 for now.  Will recheck vitamin D level prior to next visit.  7) hypokalemia: He will benefit from low-dose potassium supplement.  I discussed and prescribed potassium chloride 20 mEq p.o. daily.   - I advised patient to maintain close follow up with his PCP and he is bariatric team for ongoing care needs.   I spent 26 minutes in the care of the patient today including review of labs from [place], Lipids, Thyroid Function, Hematology (current and previous including abstractions from other facilities); face-to-face time discussing  his blood glucose readings/logs, discussing hypoglycemia and hyperglycemia episodes and symptoms, medications doses, his options of short and long term treatment based on the latest standards of care / guidelines;  discussion about incorporating lifestyle medicine;  and documenting the encounter. Risk reduction counseling performed per USPSTF guidelines to reduce  obesity and  cardiovascular risk factors.     Please refer to Patient Instructions for Blood Glucose Monitoring and Insulin/Medications Dosing Guide"  in media tab for additional information. Please  also refer to " Patient Self Inventory" in the Media  tab for reviewed elements of pertinent patient history.  Zachary Murillo. participated in the discussions, expressed understanding, and voiced agreement with the above plans.  All questions were answered to his satisfaction. he is encouraged to contact clinic should he have any questions or concerns prior to his return visit.     Follow up plan: -Return in about 3 months (around 06/24/2022) for Bring Meter/CGM Device/Logs- A1c in Office.    Rayetta Pigg, [facility] [address] [place], [place] [address] Phone: [phone] Fax: [phone]  03/25/2022, 6:26 PM

## 2022-03-26 LAB — PSA: Prostate Specific Ag, Serum: 10.2 ng/mL — ABNORMAL HIGH (ref 0.0–4.0)

## 2022-03-27 ENCOUNTER — Telehealth: Payer: Self-pay

## 2022-03-27 DIAGNOSIS — R972 Elevated prostate specific antigen [PSA]: Secondary | ICD-10-CM

## 2022-03-27 NOTE — Telephone Encounter (Signed)
Tried calling patient with no answer. Left VM for return call 

## 2022-03-27 NOTE — Telephone Encounter (Signed)
-----  Message from Festus Aloe, MD sent at 03/27/2022  1:38 PM EST ----- Let Zachary Murillo  know his PSA is higher and up to 10 so we need to proceed with a prostate biopsy. We need 1) get clearance from his cardiologist to stop his plavix, 2) schedule for prostate biopsy (please go over instructions) and have pt stop plavix and 3) have him see me 2 weeks after the biopsy to go over the results. Thank you!   ----- Message ----- From: Sherrilyn Rist, CMA Sent: 03/26/2022   9:56 AM EST To: Festus Aloe, MD  Please review

## 2022-03-28 MED ORDER — LEVOFLOXACIN 750 MG PO TABS: 750.0000 mg | ORAL_TABLET | Freq: Once | ORAL | 0 refills | Status: AC

## 2022-04-01 NOTE — Telephone Encounter (Signed)
Return call to patient. Patient is scheduled for prostate biopsy and patient voiced understanding

## 2022-04-03 ENCOUNTER — Telehealth: Payer: Self-pay

## 2022-04-03 NOTE — Telephone Encounter (Signed)
[employer] called to request the medical clearance be sent to [facility] since she is managing his Plavix rx.  I returned her call and informed her the clearance form had been sent to his PCP and we are waiting for the approval.  Wife voiced understanding.

## 2022-04-12 NOTE — Telephone Encounter (Signed)
Patient's wife called to see if we received clearance from PCP for pt to hold plavix.  Informed her that we did receive the clearance he should stop the rx 7 days prior and resume 2 days after the procedure.  Wife voiced understanding and I also attached biopsy instructions to pt MyChart.

## 2022-04-15 ENCOUNTER — Telehealth: Payer: Self-pay

## 2022-04-15 NOTE — Telephone Encounter (Signed)
Made patient/wife aware that our office receive cardio clearance for the patient to stop Plavix 7 days prior to his prostate biopsy and  to re-start medication 2 days after the procedure. Wife/Patient voiced understanding.

## 2022-04-29 ENCOUNTER — Ambulatory Visit (HOSPITAL_BASED_OUTPATIENT_CLINIC_OR_DEPARTMENT_OTHER): Payer: Medicare PPO | Admitting: Urology

## 2022-04-29 ENCOUNTER — Ambulatory Visit (HOSPITAL_COMMUNITY)
Admission: RE | Admit: 2022-04-29 | Discharge: 2022-04-29 | Disposition: A | Discharge status: Home / Self Care | Payer: Medicare PPO | Source: Ambulatory Visit | Attending: Urology | Admitting: Urology

## 2022-04-29 ENCOUNTER — Encounter (HOSPITAL_COMMUNITY): Payer: Self-pay

## 2022-04-29 ENCOUNTER — Other Ambulatory Visit: Payer: Self-pay | Admitting: Urology

## 2022-04-29 VITALS — BP 139/84 | HR 85 | Temp 97.8°F | Resp 18

## 2022-04-29 DIAGNOSIS — C61 Malignant neoplasm of prostate: Secondary | ICD-10-CM

## 2022-04-29 DIAGNOSIS — R972 Elevated prostate specific antigen [PSA]: Secondary | ICD-10-CM

## 2022-04-29 MED ORDER — GENTAMICIN SULFATE 40 MG/ML IJ SOLN: 160.0000 mg | Freq: Once | INTRAMUSCULAR | Status: AC

## 2022-04-29 MED ORDER — LIDOCAINE HCL (PF) 2 % IJ SOLN
INTRAMUSCULAR | Status: AC
  Administered 2022-04-29: 10 mL
  Filled 2022-04-29: qty 10

## 2022-04-29 MED ORDER — GENTAMICIN SULFATE 40 MG/ML IJ SOLN
INTRAMUSCULAR | Status: AC
  Administered 2022-04-29: 160 mg via INTRAMUSCULAR
  Filled 2022-04-29: qty 4

## 2022-04-29 MED ORDER — LIDOCAINE HCL (PF) 2 % IJ SOLN: 10.0000 mL | Freq: Once | INTRAMUSCULAR | Status: AC

## 2022-04-29 MED ORDER — LIDOCAINE HCL (PF) 2 % IJ SOLN
INTRAMUSCULAR | Status: AC
  Filled 2022-04-29: qty 10

## 2022-04-29 NOTE — Progress Notes (Signed)
PT tolerated prostate biopsy procedure and antibiotic injection well today. Labs obtained and sent for pathology. PT ambulatory at discharge with no acute distress noted and verbalized understanding of discharge instructions.

## 2022-04-29 NOTE — Progress Notes (Signed)
HPI:  F/u -    1) PSA elevation-his PSA in October 2022 was 5.1. This rose when rechecked April 2023 to 7.1.  His May 2023 DRE was benign. His Aug 2023 PSA was 7.4. Aug 2023 prostate MRI was benign, 83 gram prostate.    No prostate bx. No FH of PCa - maybe dad.     2) BPH - prostate was 83 g on MRI in Aug 2023. No meds or surgery. No prior history of BPH.  AUA symptom score is 2. He is on terazosin 10 mg.   [name] is seen today for the above for prostate biopsy. His Jan 2024 PSA rose to 10.2. No dysuria or gross hematuria. No congestion or fever. He has been off plavix x 7 days.   He is on Plavix - for CVA.  He has a cardiac loop. He had a shoulder MRI. He stopped plavix for rt shoulder surgery. PCP now renews it.    He was Higher education careers adviser at [employer].  Prostate Biopsy Procedure   Informed consent was obtained after discussing risks/benefits of the procedure.  A time out was performed to ensure correct patient identity.  Pre-Procedure: - Last PSA Level: 10.2 - Gentamicin given prophylactically - Levaquin 500 mg administered PO -DRE: about 50 g and rubbery right base nodularity  -Transrectal Ultrasound performed revealing a 53 grams prostate with L>R transition zone enlargement  -No significant hypoechoic or median lobe noted  Procedure: - Prostate block performed using 10 cc 1% lidocaine and biopsies taken from sextant areas, a total of 12 under ultrasound guidance.  Post-Procedure: - Patient tolerated the procedure well - He was counseled to seek immediate medical attention if experiences any severe pain, significant bleeding, or fevers - Return to discuss biopsy results -restart plavix tomorrow or Wed if any blood in stool or urine in the morning

## 2022-04-30 ENCOUNTER — Other Ambulatory Visit: Payer: Self-pay | Admitting: "Endocrinology

## 2022-05-06 ENCOUNTER — Other Ambulatory Visit: Payer: Self-pay | Admitting: "Endocrinology

## 2022-05-09 ENCOUNTER — Encounter: Payer: Self-pay | Admitting: Radiology

## 2022-05-10 ENCOUNTER — Telehealth: Payer: Self-pay | Admitting: Urology

## 2022-05-10 NOTE — Telephone Encounter (Signed)
I been trying to get in touch with [name] to go over his path results.  I called all 3 numbers and the first 2 went to voicemail the last 1 has been disconnected.  He does have follow-up Monday.  February 2024-low risk prostate cancer PSA 10.2 T2a-right base nodule, rubbery Prostate 53 g GG1, 5%, 1 core right apex 1/12 positive Staging: August 2023 prostate MRI-benign, 83 g prostate

## 2022-05-13 ENCOUNTER — Ambulatory Visit: Payer: Medicare PPO | Admitting: Urology

## 2022-05-13 VITALS — BP 115/79 | HR 87 | Ht 70.0 in | Wt 205.0 lb

## 2022-05-13 DIAGNOSIS — C61 Malignant neoplasm of prostate: Secondary | ICD-10-CM | POA: Diagnosis not present

## 2022-05-13 NOTE — Progress Notes (Signed)
05/13/2022 3:30 PM   Zachary Murillo. 11-Feb-1951 [id number]  Referring provider: Johna Roles, [place],  [place] [address]  No chief complaint on file.   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 his PSA in October 2022 was 5.1. This rose when rechecked April 2023 to 7.1.  His May 2023 DRE was benign. His Aug 2023 PSA was 7.4. Aug 2023 prostate MRI was benign, 83 gram prostate. His Jan 2024 PSA rose to 10.2.   No FH of PCa - maybe dad.   Biopsy: February 2024-low risk prostate cancer PSA 10.2 T2a-right base nodule, rubbery Prostate 53 g GG1, 5%, 1 core right apex 1/12 positive Staging: August 2023 prostate MRI-benign, 83 g prostate    2) BPH - prostate was 83 g on MRI in Aug 2023. No meds or surgery. No prior history of BPH.  AUA symptom score is 2. He is on terazosin 10 mg.   Zachary Murillo is seen today for the above.   He is on Plavix - for CVA.  He has a cardiac loop. He had a shoulder MRI. He stopped plavix for rt shoulder surgery. PCP now renews it.    He was Higher education careers adviser at [employer].  PMH: Past Medical History:  Diagnosis Date   Acute ischemic stroke ([place]) 02/22/2013   CVA (cerebral infarction) 01/22/2013   Diabetes mellitus ([place])    Difficulty in walking(719.7) 01/25/2013   History of Roux-en-Y gastric bypass    Hypertension    IMPINGEMENT SYNDROME 01/16/2009   Qualifier: Diagnosis of  By: Aline Brochure MD, Dorothyann Peng     KNEE PAIN 01/16/2009   Qualifier: Diagnosis of  By: Aline Brochure MD, Stanley     Knee pain, right 09/05/2011   KNEE, ARTHRITIS, DEGEN./OSTEO 01/30/2009   Qualifier: Diagnosis of  By: Aline Brochure MD, Stanley     Lack of coordination 01/22/2013   Non-ischemic cardiomyopathy ([place])    Osteoarthritis of knee    Other obesity due to excess calories 01/30/2015   Overweight(278.02)    Pulmonary hypertension ([place])    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 12/2014, Dr. Flavia Shipper   S/P lumbar fusion 07/02/2013   SHOULDER PAIN 01/16/2009   Qualifier: Diagnosis of  By: Aline Brochure MD, Stanley     Shoulder pain, right    Sleep apnea    cpap   Sleep apnea    Spinal stenosis    Stroke [facility])    TENDINITIS, LEFT KNEE 01/16/2009   Qualifier: Diagnosis of  By: Aline Brochure MD, Arlana Hove D deficiency     Surgical History: Past Surgical History:  Procedure Laterality Date   BACK SURGERY     CARDIAC CATHETERIZATION     gastric bypass surgery      KNEE ARTHROSCOPY     loop recorder  12/01/2018   Per [facility] Rep Chris-pt is unsafe to have any MRI-Loop recorder is end of life   10/01/2021   NASAL SEPTUM SURGERY     REVERSE SHOULDER ARTHROPLASTY Right 01/03/2022   Procedure: REVERSE SHOULDER ARTHROPLASTY;  Surgeon: Justice Britain, MD;  Location: [facility];  Service: Orthopedics;  Laterality: Right;    Home Medications:  Allergies as of 05/13/2022       Reactions   Prednisone    Starts itching        Medication List  Accurate as of May 13, 2022  3:30 PM. If you have any questions, ask your nurse or doctor.          Accu-Chek Guide Me w/Device Kit 1 Piece by Does not apply route as directed.   Accu-Chek Guide test strip Generic drug: glucose blood USE TO TEST BLOOD SUGAR FOUR TIMES DAILY. E11.65   ascorbic acid 500 MG tablet Commonly known as: VITAMIN C Take 500 mg by mouth daily.   atorvastatin 10 MG tablet Commonly known as: LIPITOR TAKE (1) TABLET BY MOUTH AT BEDTIME.   CALCIUM + D PO Take 600 mg by mouth 2 (two) times daily.   CENTRUM PO Take 1 tablet by mouth every morning. [place] chewable with extra iron   cetirizine 10 MG tablet Commonly known as: ZYRTEC Take 10 mg by mouth daily as needed for allergies.   clopidogrel 75 MG tablet Commonly known as: PLAVIX Take 1 tablet (75 mg total) by mouth daily.   colestipol 1 g tablet Commonly known as: COLESTID Take 1 g by mouth 2 (two) times  daily.   fluticasone 50 MCG/ACT nasal spray Commonly known as: FLONASE Place 2 sprays into both nostrils daily as needed for allergies.   glipiZIDE 5 MG 24 hr tablet Commonly known as: Glucotrol XL Take 1 tablet (5 mg total) by mouth daily with breakfast.   hydrochlorothiazide 12.5 MG tablet Commonly known as: HYDRODIURIL Take 12.5 mg by mouth daily.   lisinopril 40 MG tablet Commonly known as: ZESTRIL Take 40 mg by mouth every morning.   metFORMIN 500 MG tablet Commonly known as: GLUCOPHAGE Take 1 tablet (500 mg total) by mouth 2 (two) times daily with a meal.   potassium chloride SA 20 MEQ tablet Commonly known as: KLOR-CON M Take 1 tablet (20 mEq total) by mouth daily.   sertraline 50 MG tablet Commonly known as: ZOLOFT Take 1 tablet (50 mg total) by mouth daily. What changed: how much to take   Trulicity 1.5 0000000 Sopn Generic drug: Dulaglutide INJECT 1.'5MG'$ /0.5ML INTO THE SKIN ONCE WEEKLY   Vitamin D 125 MCG (5000 UT) Caps Take 5,000 Units by mouth daily.   zinc gluconate 50 MG tablet Take 50 mg by mouth daily.        Allergies:  Allergies  Allergen Reactions   Prednisone     Starts itching    Family History: Family History  Problem Relation Age of Onset   Heart disease Other    Arthritis Other    Cancer Other    Asthma Other    Diabetes Other    Breast cancer Mother    Heart disease Mother    Heart disease Father    Stroke Father    Cancer Sister    Heart disease Brother    COPD Sister     Social History:  reports that he has never smoked. He has never used smokeless tobacco. He reports current alcohol use. He reports that he does not use drugs.   Physical Exam: There were no vitals taken for this visit.  Constitutional:  Alert and oriented, No acute distress. HEENT: [place] AT, moist mucus membranes.  Trachea midline, no masses. Cardiovascular: No clubbing, cyanosis, or edema. Respiratory: Normal respiratory effort, no increased work of  breathing. GI: Abdomen is soft, nontender, nondistended, no abdominal masses GU: No CVA tenderness Skin: No rashes, bruises or suspicious lesions. Neurologic: Grossly intact, no focal deficits, moving all 4 extremities. Psychiatric: Normal mood and affect.  Laboratory Data: Lab Results  Component Value Date   WBC 8.6 12/25/2021   HGB 14.7 12/25/2021   HCT 46.4 12/25/2021   MCV 86.9 12/25/2021   PLT 228 12/25/2021    Lab Results  Component Value Date   CREATININE 0.92 03/20/2022    No results found for: "PSA"  No results found for: "TESTOSTERONE"  Lab Results  Component Value Date   HGBA1C 7.5 (A) 03/25/2022    Urinalysis    Component Value Date/Time   APPEARANCEUR Clear 03/25/2022 0907   GLUCOSEU Negative 03/25/2022 0907   BILIRUBINUR Negative 03/25/2022 0907   PROTEINUR Trace 03/25/2022 0907   NITRITE Negative 03/25/2022 0907   LEUKOCYTESUR Trace (A) 03/25/2022 0907    Lab Results  Component Value Date   LABMICR See below: 03/25/2022   WBCUA 0-5 03/25/2022   LABEPIT 0-10 03/25/2022   MUCUS Present (A) 03/25/2022   BACTERIA None seen 03/25/2022    Pertinent Imaging: Results for orders placed during the hospital encounter of 10/04/21  DG Abd 1 View  Narrative CLINICAL DATA:  Alternating constipation and diarrhea for several months.  EXAM: ABDOMEN - 1 VIEW  COMPARISON:  June 07, 2019.  FINDINGS: The bowel gas pattern is normal. No significant stool burden is noted. No radio-opaque calculi or other significant radiographic abnormality are seen.  IMPRESSION: No abnormal bowel dilatation is noted.   Electronically Signed By: Marijo Conception M.D. On: 10/05/2021 12:40  Reviewed path report   Assessment & Plan:    PCa - I had a long discussion with the patient and his wife using his path report as a reference. We went his stage, grade and prognosis and the relevant anatomy. We discussed the nature risks and benefits of active surveillance,  radical prostatectomy, IMRT (+/- brachytherapy, +/- ADT). We discussed specifically how each treatment might affect the bowel, bladder and sexual function. We discussed how each treatment might effect salvage treatments. We discussed the role of other modalities in the treatment of prostate cancer including chemotherapy, HIFU and cryotherapy.  He has a friend that had brachytherapy but this patient had "a lot of problems peeing afterwards". Again discussed possible affect on bladder and urethra.   Discussed the main concern here is his higher PSA. They will consider options and follow-up in 6 weeks with a PSA prior.   He sees some of the [facility]. Discussed referral to [facility] for second opinion.   No follow-ups on file.  Festus Aloe, MD  [facility]  [address] [place], [place] [address] [phone]

## 2022-06-05 ENCOUNTER — Encounter (INDEPENDENT_AMBULATORY_CARE_PROVIDER_SITE_OTHER): Payer: Medicare PPO | Admitting: Ophthalmology

## 2022-06-17 ENCOUNTER — Other Ambulatory Visit: Payer: Medicare PPO

## 2022-06-17 ENCOUNTER — Other Ambulatory Visit: Payer: Self-pay | Admitting: "Endocrinology

## 2022-06-17 DIAGNOSIS — C61 Malignant neoplasm of prostate: Secondary | ICD-10-CM | POA: Diagnosis not present

## 2022-06-18 LAB — PSA: Prostate Specific Ag, Serum: 11.6 ng/mL — ABNORMAL HIGH (ref 0.0–4.0)

## 2022-06-19 DIAGNOSIS — D3132 Benign neoplasm of left choroid: Secondary | ICD-10-CM | POA: Diagnosis not present

## 2022-06-19 DIAGNOSIS — E119 Type 2 diabetes mellitus without complications: Secondary | ICD-10-CM | POA: Diagnosis not present

## 2022-06-19 DIAGNOSIS — H33322 Round hole, left eye: Secondary | ICD-10-CM | POA: Diagnosis not present

## 2022-06-24 ENCOUNTER — Ambulatory Visit: Payer: Medicare PPO | Admitting: Urology

## 2022-06-24 VITALS — BP 126/67 | HR 87

## 2022-06-24 DIAGNOSIS — I158 Other secondary hypertension: Secondary | ICD-10-CM | POA: Diagnosis not present

## 2022-06-24 DIAGNOSIS — E119 Type 2 diabetes mellitus without complications: Secondary | ICD-10-CM | POA: Diagnosis not present

## 2022-06-24 DIAGNOSIS — I151 Hypertension secondary to other renal disorders: Secondary | ICD-10-CM | POA: Diagnosis not present

## 2022-06-24 DIAGNOSIS — R0602 Shortness of breath: Secondary | ICD-10-CM | POA: Diagnosis not present

## 2022-06-24 DIAGNOSIS — Z9884 Bariatric surgery status: Secondary | ICD-10-CM | POA: Diagnosis not present

## 2022-06-24 DIAGNOSIS — G4733 Obstructive sleep apnea (adult) (pediatric): Secondary | ICD-10-CM | POA: Diagnosis not present

## 2022-06-24 DIAGNOSIS — C61 Malignant neoplasm of prostate: Secondary | ICD-10-CM | POA: Diagnosis not present

## 2022-06-24 DIAGNOSIS — N400 Enlarged prostate without lower urinary tract symptoms: Secondary | ICD-10-CM | POA: Diagnosis not present

## 2022-06-24 DIAGNOSIS — Z8673 Personal history of transient ischemic attack (TIA), and cerebral infarction without residual deficits: Secondary | ICD-10-CM | POA: Diagnosis not present

## 2022-06-24 DIAGNOSIS — Z7901 Long term (current) use of anticoagulants: Secondary | ICD-10-CM | POA: Diagnosis not present

## 2022-06-24 NOTE — Progress Notes (Unsigned)
06/24/2022 3:08 PM   Zachary Murillo. August 12, 1950 [id number]  Referring provider: Delma Officer, PA [address] [place],  [place] [address]  No chief complaint on file.   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 his PSA in October 2022 was 5.1. This rose when rechecked April 2023 to 7.1.  His May 2023 DRE was benign. His Aug 2023 PSA was 7.4. Aug 2023 prostate MRI was benign, 83 gram prostate. His Jan 2024 PSA rose to 10.2.   No FH of PCa - maybe dad.    Biopsy: February 2024-low risk prostate cancer PSA 10.2 T2a-right base nodule, rubbery Prostate 53 g GG1, 5%, 1 core right apex 1/12 positive Staging: August 2023 prostate MRI-benign, 83 g prostate    2) BPH - prostate was 83 g on MRI in Aug 2023. No meds or surgery. No prior history of BPH.  AUA symptom score is 2. He is on terazosin 10 mg.   Zachary Murillo is seen today for the above. His PSA rose to 11.6 in Apr 2024. He would like to continue AS for as long as possible or consider brachytherapy, but his daughter asked him why "he wanted to let something grow inside of him". His nephew had brachy but got a UTI and had a "terrible time".   He is on Plavix - for CVA.  He has a cardiac loop. He had a shoulder MRI. He stopped plavix for rt shoulder surgery. PCP now renews it.    He was [employer], political party at [employer].  PMH: Past Medical History:  Diagnosis Date   Acute ischemic stroke ([facility]) 02/22/2013   CVA (cerebral infarction) 01/22/2013   Diabetes mellitus ([facility])    Difficulty in walking(719.7) 01/25/2013   History of Roux-en-Y gastric bypass    Hypertension    IMPINGEMENT SYNDROME 01/16/2009   Qualifier: Diagnosis of  By: Zachary Murillo, Zachary Murillo     KNEE PAIN 01/16/2009   Qualifier: Diagnosis of  By: Zachary Murillo, Zachary Murillo     Knee pain, right 09/05/2011   KNEE, ARTHRITIS, DEGEN./OSTEO 01/30/2009   Qualifier: Diagnosis of  By: Zachary Murillo, Zachary Murillo     Lack of coordination 01/22/2013    Non-ischemic cardiomyopathy ([facility])    Osteoarthritis of knee    Other obesity due to excess calories 01/30/2015   Overweight(278.02)    Pulmonary hypertension ([facility])    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 12/2014, Dr. Bufford Lope   S/P lumbar fusion 07/02/2013   SHOULDER PAIN 01/16/2009   Qualifier: Diagnosis of  By: Zachary Murillo, Zachary Murillo     Shoulder pain, right    Sleep apnea    cpap   Sleep apnea    Spinal stenosis    Stroke [facility])    TENDINITIS, LEFT KNEE 01/16/2009   Qualifier: Diagnosis of  By: Zachary Murillo, Rowe Timonthy D deficiency     Surgical History: Past Surgical History:  Procedure Laterality Date   BACK SURGERY     CARDIAC CATHETERIZATION     gastric bypass surgery      KNEE ARTHROSCOPY     loop recorder  12/01/2018   Per [facility] Rep Chris-pt is unsafe to have any MRI-Loop recorder is end of life   10/01/2021   NASAL SEPTUM SURGERY     REVERSE SHOULDER ARTHROPLASTY Right 01/03/2022   Procedure: REVERSE SHOULDER ARTHROPLASTY;  Surgeon: Zachary Hanly, Murillo;  Location: [facility];  Service: Orthopedics;  Laterality: Right;    Home Medications:  Allergies as of 06/24/2022       Reactions   Prednisone    Starts itching        Medication List        Accurate as of June 24, 2022  3:08 PM. If you have any questions, ask your nurse or doctor.          Accu-Chek Guide Me w/Device Kit 1 Piece by Does not apply route as directed.   Accu-Chek Guide test strip Generic drug: glucose blood USE TO TEST BLOOD SUGAR FOUR TIMES DAILY. E11.65   ascorbic acid 500 MG tablet Commonly known as: VITAMIN C Take 500 mg by mouth daily.   atorvastatin 10 MG tablet Commonly known as: LIPITOR TAKE (1) TABLET BY MOUTH AT BEDTIME.   CALCIUM + D PO Take 600 mg by mouth daily as needed. PRN   CENTRUM PO Take 1 tablet by mouth every morning. [place] chewable with extra iron   cetirizine 10 MG tablet Commonly  known as: ZYRTEC Take 10 mg by mouth daily as needed for allergies.   clopidogrel 75 MG tablet Commonly known as: PLAVIX Take 1 tablet (75 mg total) by mouth daily.   colestipol 1 g tablet Commonly known as: COLESTID Take 1 g by mouth 2 (two) times daily.   fluticasone 50 MCG/ACT nasal spray Commonly known as: FLONASE Place 2 sprays into both nostrils daily as needed for allergies.   glipiZIDE 5 MG 24 hr tablet Commonly known as: Glucotrol XL Take 1 tablet (5 mg total) by mouth daily with breakfast.   hydrochlorothiazide 12.5 MG tablet Commonly known as: HYDRODIURIL Take 12.5 mg by mouth daily.   lisinopril 40 MG tablet Commonly known as: ZESTRIL Take 40 mg by mouth every morning.   metFORMIN 500 MG tablet Commonly known as: GLUCOPHAGE Take 1 tablet (500 mg total) by mouth 2 (two) times daily with a meal.   potassium chloride SA 20 MEQ tablet Commonly known as: KLOR-CON M TAKE (1) TABLET BY MOUTH ONCE DAILY.   sertraline 50 MG tablet Commonly known as: ZOLOFT Take 1 tablet (50 mg total) by mouth daily. What changed: how much to take   Trulicity 1.5 MG/0.5ML Sopn Generic drug: Dulaglutide INJECT 1.5MG /0.5ML INTO THE SKIN ONCE WEEKLY   Vitamin D 125 MCG (5000 UT) Caps Take 5,000 Units by mouth daily.   zinc gluconate 50 MG tablet Take 50 mg by mouth daily.        Allergies:  Allergies  Allergen Reactions   Prednisone     Starts itching    Family History: Family History  Problem Relation Age of Onset   Heart disease Other    Arthritis Other    Cancer Other    Asthma Other    Diabetes Other    Breast cancer Mother    Heart disease Mother    Heart disease Father    Stroke Father    Cancer Sister    Heart disease Brother    COPD Sister     Social History:  reports that he has never smoked. He has never used smokeless tobacco. He reports current alcohol use. He reports that he does not use drugs.   Physical Exam: There were no vitals taken  for this visit.  Constitutional:  Alert and oriented, No acute distress. HEENT:  AT, moist mucus membranes.  Trachea midline, no masses. Cardiovascular: No  clubbing, cyanosis, or edema. Respiratory: Normal respiratory effort, no increased work of breathing. GI: Abdomen is soft, nontender, nondistended, no abdominal masses GU: No CVA tenderness Skin: No rashes, bruises or suspicious lesions. Neurologic: Grossly intact, no focal deficits, moving all 4 extremities. Psychiatric: Normal mood and affect.  Laboratory Data: Lab Results  Component Value Date   WBC 8.6 12/25/2021   HGB 14.7 12/25/2021   HCT 46.4 12/25/2021   MCV 86.9 12/25/2021   PLT 228 12/25/2021    Lab Results  Component Value Date   CREATININE 0.92 03/20/2022    No results found for: "PSA"  No results found for: "TESTOSTERONE"  Lab Results  Component Value Date   HGBA1C 7.5 (A) 03/25/2022    Urinalysis    Component Value Date/Time   APPEARANCEUR Clear 03/25/2022 0907   GLUCOSEU Negative 03/25/2022 0907   BILIRUBINUR Negative 03/25/2022 0907   PROTEINUR Trace 03/25/2022 0907   NITRITE Negative 03/25/2022 0907   LEUKOCYTESUR Trace (A) 03/25/2022 0907    Lab Results  Component Value Date   LABMICR See below: 03/25/2022   WBCUA 0-5 03/25/2022   LABEPIT 0-10 03/25/2022   MUCUS Present (A) 03/25/2022   BACTERIA None seen 03/25/2022    Pertinent Imaging: N/a  Assessment & Plan:    1. Prostate cancer PSA is up -- discussed PCa treatment but he would like to continue AS if feasible. We will repeat prostate MRI. Consider PCa treatment if PSA rises or repeat biopsy.    - Urinalysis, Routine w reflex microscopic  2. BPH without obstruction/lower urinary tract symptoms Doing well.  - Urinalysis, Routine w reflex microscopic   No follow-ups on file.  Jerilee Field, Murillo  [facility]  [address] [place], [place] [address] [phone]

## 2022-06-25 ENCOUNTER — Ambulatory Visit: Payer: Medicare PPO | Admitting: "Endocrinology

## 2022-06-25 LAB — URINALYSIS, ROUTINE W REFLEX MICROSCOPIC
Bilirubin, UA: NEGATIVE
Ketones, UA: NEGATIVE
Nitrite, UA: NEGATIVE
Protein,UA: NEGATIVE
RBC, UA: NEGATIVE
Specific Gravity, UA: 1.015 (ref 1.005–1.030)
Urobilinogen, Ur: 0.2 mg/dL (ref 0.2–1.0)
pH, UA: 5 (ref 5.0–7.5)

## 2022-06-25 LAB — MICROSCOPIC EXAMINATION: Bacteria, UA: NONE SEEN

## 2022-06-26 ENCOUNTER — Telehealth: Payer: Self-pay | Admitting: Gastroenterology

## 2022-06-26 ENCOUNTER — Telehealth: Payer: Self-pay

## 2022-06-26 ENCOUNTER — Ambulatory Visit: Payer: Medicare PPO | Admitting: "Endocrinology

## 2022-06-26 ENCOUNTER — Encounter: Payer: Self-pay | Admitting: "Endocrinology

## 2022-06-26 VITALS — BP 116/68 | HR 64 | Ht 70.0 in | Wt 206.8 lb

## 2022-06-26 DIAGNOSIS — E1159 Type 2 diabetes mellitus with other circulatory complications: Secondary | ICD-10-CM

## 2022-06-26 DIAGNOSIS — I10 Essential (primary) hypertension: Secondary | ICD-10-CM

## 2022-06-26 DIAGNOSIS — E782 Mixed hyperlipidemia: Secondary | ICD-10-CM | POA: Diagnosis not present

## 2022-06-26 NOTE — Patient Instructions (Signed)

## 2022-06-26 NOTE — Telephone Encounter (Signed)
Patient is seeking a transfer of care, called patients wife to let her know records were received from [facility]. Need to know the reason for request left voicemail.

## 2022-06-26 NOTE — Progress Notes (Unsigned)
06/26/2022  Endocrinology follow-up note   Subjective:    Patient ID: Zachary Devoid., male    DOB: 10/03/1950, PCP Delma Officer, PA   Past Medical History:  Diagnosis Date   Acute ischemic stroke 02/22/2013   CVA (cerebral infarction) 01/22/2013   Diabetes mellitus    Difficulty in walking(719.7) 01/25/2013   History of Roux-en-Y gastric bypass    Hypertension    IMPINGEMENT SYNDROME 01/16/2009   Qualifier: Diagnosis of  By: Romeo Apple MD, Duffy Rhody     KNEE PAIN 01/16/2009   Qualifier: Diagnosis of  By: Romeo Apple MD, Stanley     Knee pain, right 09/05/2011   KNEE, ARTHRITIS, DEGEN./OSTEO 01/30/2009   Qualifier: Diagnosis of  By: Romeo Apple MD, Stanley     Lack of coordination 01/22/2013   Non-ischemic cardiomyopathy    Osteoarthritis of knee    Other obesity due to excess calories 01/30/2015   Overweight(278.02)    Pulmonary hypertension    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 12/2014, Dr. Bufford Lope   S/P lumbar fusion 07/02/2013   SHOULDER PAIN 01/16/2009   Qualifier: Diagnosis of  By: Romeo Apple MD, Stanley     Shoulder pain, right    Sleep apnea    cpap   Sleep apnea    Spinal stenosis    Stroke    TENDINITIS, LEFT KNEE 01/16/2009   Qualifier: Diagnosis of  By: Romeo Apple MD, Rowe Savva D deficiency    Past Surgical History:  Procedure Laterality Date   BACK SURGERY     CARDIAC CATHETERIZATION     gastric bypass surgery      KNEE ARTHROSCOPY     loop recorder  12/01/2018   Per [facility] Rep Chris-pt is unsafe to have any MRI-Loop recorder is end of life   10/01/2021   NASAL SEPTUM SURGERY     PROSTATE BIOPSY  2024   REVERSE SHOULDER ARTHROPLASTY Right 01/03/2022   Procedure: REVERSE SHOULDER ARTHROPLASTY;  Surgeon: Francena Hanly, MD;  Location: [facility];  Service: Orthopedics;  Laterality: Right;   Social History   Socioeconomic History   Marital status: Married    Spouse name: Inetta Fermo     Number of children: 1   Years of education: Masters   Highest education level: Not on file  Occupational History   Occupation: Retired-Drivers Ed   Tobacco Use   Smoking status: Never   Smokeless tobacco: Never  Vaping Use   Vaping Use: Never used  Substance and Sexual Activity   Alcohol use: Yes    Comment: occ   Drug use: No   Sexual activity: Yes    Birth control/protection: Surgical  Other Topics Concern   Not on file  Social History Narrative   Lives with wife-42 years   One daughter who lives in [place] grandchildren, one expecting      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, Research scientist (life sciences) racing, fishing-Cubs and Acupuncturist      Diet: eats all food groups   Caffeine: 2 cups of coffee daily, tea every other day   Water: 2-3 cups daily      Wears seat belt   Does not use phone while driving    Psychologist, sport and exercise             Social Determinants of Health   Financial Resource Strain: Not on file  Food Insecurity: Not on file  Transportation Needs: Not on file  Physical  Activity: Not on file  Stress: Not on file  Social Connections: Not on file   Outpatient Encounter Medications as of 06/26/2022  Medication Sig   chlorthalidone (HYGROTON) 25 MG tablet Take 25 mg by mouth daily.   ascorbic acid (VITAMIN C) 500 MG tablet Take 500 mg by mouth daily.   atorvastatin (LIPITOR) 10 MG tablet TAKE (1) TABLET BY MOUTH AT BEDTIME.   Blood Glucose Monitoring Suppl (ACCU-CHEK GUIDE ME) w/Device KIT 1 Piece by Does not apply route as directed.   Calcium Citrate-Vitamin D (CALCIUM + D PO) Take 600 mg by mouth daily as needed. PRN   cetirizine (ZYRTEC) 10 MG tablet Take 10 mg by mouth daily as needed for allergies.    Cholecalciferol (VITAMIN D) 125 MCG (5000 UT) CAPS Take 5,000 Units by mouth daily.   clopidogrel (PLAVIX) 75 MG tablet Take 1 tablet (75 mg total) by mouth daily.   colestipol (COLESTID) 1 g tablet Take 1 g by mouth 2 (two) times daily.   fluticasone  (FLONASE) 50 MCG/ACT nasal spray Place 2 sprays into both nostrils daily as needed for allergies.   glucose blood (ACCU-CHEK GUIDE) test strip USE TO TEST BLOOD SUGAR FOUR TIMES DAILY. E11.65   lisinopril (PRINIVIL,ZESTRIL) 40 MG tablet Take 40 mg by mouth every morning.   metFORMIN (GLUCOPHAGE) 500 MG tablet Take 1 tablet (500 mg total) by mouth 2 (two) times daily with a meal.   Multiple Vitamins-Minerals (CENTRUM PO) Take 1 tablet by mouth every morning. Flintstone chewable with extra iron   potassium chloride SA (KLOR-CON M) 20 MEQ tablet TAKE (1) TABLET BY MOUTH ONCE DAILY.   sertraline (ZOLOFT) 50 MG tablet Take 1 tablet (50 mg total) by mouth daily. (Patient taking differently: Take 75 mg by mouth daily.)   TRULICITY 1.5 MG/0.5ML SOPN INJECT 1.5MG /0.5ML INTO THE SKIN ONCE WEEKLY   zinc gluconate 50 MG tablet Take 50 mg by mouth daily.   [DISCONTINUED] glipiZIDE (GLUCOTROL XL) 5 MG 24 hr tablet Take 1 tablet (5 mg total) by mouth daily with breakfast.   [DISCONTINUED] hydrochlorothiazide (HYDRODIURIL) 12.5 MG tablet Take 12.5 mg by mouth daily.   No facility-administered encounter medications on file as of 06/26/2022.   ALLERGIES: Allergies  Allergen Reactions   Prednisone     Starts itching   VACCINATION STATUS: Immunization History  Administered Date(s) Administered   Influenza, High Dose Seasonal PF 11/29/2016, 01/20/2018   Influenza, Seasonal, Injecte, Preservative Fre 12/27/2012   Influenza,inj,Quad PF,6+ Mos 01/06/2015   Influenza-Unspecified 12/23/2013, 12/25/2015, 11/19/2018   PFIZER(Purple Top)SARS-COV-2 Vaccination 04/03/2019, 04/24/2019, 12/30/2019   Zoster, Live 04/25/2014    Diabetes He presents for his follow-up diabetic visit. He has type 2 diabetes mellitus. Onset time: He was diagnosed at approximate age of 39 years. His disease course has been worsening. There are no hypoglycemic associated symptoms. Pertinent negatives for hypoglycemia include no confusion,  headaches, pallor or seizures. Pertinent negatives for diabetes include no chest pain, no fatigue, no polydipsia, no polyphagia, no polyuria and no weakness. There are no hypoglycemic complications. Symptoms are worsening. Diabetic complications include a CVA and heart disease. Risk factors for coronary artery disease include diabetes mellitus, dyslipidemia, hypertension, male sex, obesity and sedentary lifestyle. Current diabetic treatment includes oral agent (monotherapy) (Trulicity 1.5 weekly). He is compliant with treatment all of the time. His weight is fluctuating minimally. He has had a previous visit with a dietitian. He participates in exercise intermittently. His home blood glucose trend is fluctuating minimally. His breakfast blood glucose  range is generally 130-140 mg/dl. His bedtime blood glucose range is generally 140-180 mg/dl. His overall blood glucose range is 140-180 mg/dl. Zachary Murillo presents with his meter showing slightly above target glycemic profile confirmed by reviewing his logs.  His point-of-care A1c is 7.5%, increasing from 7.1%.  He did not tolerate Trulicity 3 mg, lowered to 1.5 mg in the interim.  He is also on metformin 500 mg p.o. 3 times daily.   ) An ACE inhibitor/angiotensin II receptor blocker is being taken. He sees a podiatrist.Eye exam is current.  Hyperlipidemia This is a chronic problem. The current episode started more than 1 year ago. The problem is controlled. Recent lipid tests were reviewed and are normal. Exacerbating diseases include chronic renal disease, diabetes and obesity. Pertinent negatives include no chest pain, myalgias or shortness of breath. Current antihyperlipidemic treatment includes statins. The current treatment provides moderate improvement of lipids. Compliance problems include adherence to exercise.  Risk factors for coronary artery disease include diabetes mellitus, dyslipidemia, hypertension, male sex, a sedentary lifestyle, obesity and family  history.  Hypertension This is a chronic problem. The current episode started more than 1 year ago. The problem has been gradually improving since onset. The problem is controlled. Pertinent negatives include no chest pain, headaches, neck pain, palpitations or shortness of breath. There are no associated agents to hypertension. Risk factors for coronary artery disease include diabetes mellitus, dyslipidemia, male gender, obesity and sedentary lifestyle. Past treatments include ACE inhibitors, diuretics and calcium channel blockers. The current treatment provides moderate improvement. Compliance problems include exercise and diet.  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 Identifiable causes of hypertension include chronic renal disease and hyperaldosteronism.    Review of systems  Constitutional: + Lost 4 pounds since last visit, current Body mass index is 29.67 kg/m. , no fatigue, no subjective hyperthermia, no subjective hypothermia    Objective:    BP 116/68   Pulse 64   Ht 5\' 10"  (1.778 m)   Wt 206 lb 12.8 oz (93.8 kg)   BMI 29.67 kg/m   Wt Readings from Last 3 Encounters:  06/26/22 206 lb 12.8 oz (93.8 kg)  05/13/22 205 lb (93 kg)  03/25/22 199 lb 6.4 oz (90.4 kg)    BP Readings from Last 3 Encounters:  06/26/22 116/68  06/24/22 126/67  05/13/22 115/79     Physical Exam- Limited  Constitutional:  Body mass index is 29.67 kg/m. , not in acute distress, normal state of mind   Results for orders placed or performed in visit on 06/24/22  Microscopic Examination   Urine  Result Value Ref Range   WBC, UA 0-5 0 - 5 /hpf   RBC, Urine 0-2 0 - 2 /hpf   Epithelial Cells (non renal) 0-10 0 - 10 /hpf   Bacteria, UA None seen None seen/Few  Urinalysis, Routine w reflex microscopic  Result Value Ref Range   Specific Gravity, UA 1.015 1.005 - 1.030   pH, UA 5.0 5.0 - 7.5   Color, UA Yellow Yellow   Appearance Ur Clear Clear   Leukocytes,UA Trace (A) Negative    Protein,UA Negative Negative/Trace   Glucose, UA Trace (A) Negative   Ketones, UA Negative Negative   RBC, UA Negative Negative   Bilirubin, UA Negative Negative   Urobilinogen, Ur 0.2 0.2 - 1.0 mg/dL   Nitrite, UA Negative Negative   Microscopic Examination See below:    Diabetic Labs (most recent): Lab Results  Component Value Date   HGBA1C 7.5 (  A) 03/25/2022   HGBA1C 7.3 (H) 12/25/2021   HGBA1C 7.1 (A) 12/20/2021      Latest Ref Rng & Units 03/20/2022    8:18 AM 12/25/2021    9:53 AM 12/17/2021    8:44 AM  CMP  Glucose 70 - 99 mg/dL 767  341  937   BUN 8 - 27 mg/dL 18  20  13    Creatinine 0.76 - 1.27 mg/dL 9.02  4.09  7.35   Sodium 134 - 144 mmol/L 140  139  146   Potassium 3.5 - 5.2 mmol/L 3.5  3.8  3.3   Chloride 96 - 106 mmol/L 100  102  104   CO2 20 - 29 mmol/L 25  29  22    Calcium 8.6 - 10.2 mg/dL 9.1  9.4  9.4   Total Protein 6.0 - 8.5 g/dL 6.4   7.0   Total Bilirubin 0.0 - 1.2 mg/dL 0.7   1.1   Alkaline Phos 44 - 121 IU/L 169   154   AST 0 - 40 IU/L 23   22   ALT 0 - 44 IU/L 23   28    Lipid Panel     Component Value Date/Time   CHOL 116 03/20/2022 0818   TRIG 69 03/20/2022 0818   HDL 49 03/20/2022 0818   CHOLHDL 2.4 03/20/2022 0818   CHOLHDL 2.2 01/04/2016 0745   VLDL 13 01/04/2016 0745   LDLCALC 53 03/20/2022 0818   LABVLDL 14 03/20/2022 0818     Assessment & Plan:   1) Type 2 diabetes mellitus with vascular disease (HCC)  His diabetes is complicated by CVA and patient remains at a high risk for more acute and chronic complications of diabetes which include CAD, CVA, CKD, retinopathy, and neuropathy. He was diagnosed at approximate age of 1 years. Zachary Murillo presents with his meter showing slightly above target glycemic profile confirmed by reviewing his logs.  His point-of-care A1c is 7.5%, increasing from 7.1%.  He did not tolerate Trulicity 3 mg, lowered to 1.5 mg in the interim.  He is also on metformin 500 mg p.o. 3 times daily.    -He maintains 140  pounds of weight loss status post his Roux-en-Y bypass surgery.  He denies any hypoglycemia.    Recent labs reviewed. This patient continues to benefit from lifestyle medicine.    - he acknowledges that there is a room for improvement in his food and drink choices. - Suggestion is made for him to avoid simple carbohydrates  from his diet including Cakes, Sweet Desserts, Ice Cream, Soda (diet and regular), Sweet Tea, Candies, Chips, Cookies, Store Bought Juices, Alcohol , Artificial Sweeteners,  Coffee Creamer, and "Sugar-free" Products, Lemonade. This will help patient to have more stable blood glucose profile and potentially avoid unintended weight gain.  The following Lifestyle Medicine recommendations according to American College of Lifestyle Medicine  [facility]) were discussed and and offered to patient and he  agrees to start the journey:  A. Whole Foods, Plant-Based Nutrition comprising of fruits and vegetables, plant-based proteins, whole-grain carbohydrates was discussed in detail with the patient.   A list for source of those nutrients were also provided to the patient.  Patient will use only water or unsweetened tea for hydration. B.  The need to stay away from risky substances including alcohol, smoking; obtaining 7 to 9 hours of restorative sleep, at least 150 minutes of moderate intensity exercise weekly, the importance of healthy social connections,  and stress management  techniques were discussed. C.  A full color page of  Calorie density of various food groups per pound showing examples of each food groups was provided to the patient.    - Patient is advised to stick to a routine mealtimes to eat 3 meals  a day and avoid unnecessary snacks ( to snack only to correct hypoglycemia).  - I have approached patient with the following individualized plan to manage diabetes and patient agrees.  - After a successful Roux-en-Y bariatric surgery and lost approximately 145 pounds, he was able  to come off of multiple daily injections of insulin therapy. His presentation is consistent with above target glycemic profile he will need regional intervention since he is not tolerating the optimal dose of Trulicity.  He is advised to continue Trulicity at a lower dose of 1.5 mg subcutaneously weekly, advised to continue metformin 500 mg p.o. twice daily with breakfast and supper.  I discussed and added glipizide 5 mg XL p.o. daily at breakfast.   - He is advised to continue monitoring blood glucose at l at fasting and before going to bed, and advised to call clinic for hypoglycemia below 70 or fasting hyperglycemia greater than 150 mg/day) in a week.     - Patient specific target  for A1c; LDL, HDL, Triglycerides,  were discussed in detail.  2) BP/HTN:  -His blood pressure is controlled to target.  His current blood pressure medications include lisinopril 40 mg p.o. daily at breakfast..  3) Lipids/HPL:  His most recent lipid panel showed improved lipid profile with LDL at 53.   Side effects and precautions discussed with him.   4)  Weight/Diet:  His Body mass index is 29.67 kg/m. He is status post Roux-en-Y bariatric surgery.  He is still a candidate for modest weight loss.  The above detailed lifestyle medicine will help control weight, exercise, and carbohydrates information provided.  He is approached to reengage in routine physical activity to prevent further weight gain.  5) Chronic Care/Health Maintenance: -Patient is on ACEI/ARB and Statin medications and encouraged to continue to follow up with Ophthalmology, Podiatrist at least yearly or according to recommendations, and advised to  stay away from smoking. I have recommended yearly flu vaccine and pneumonia vaccination at least every 5 years; moderate intensity exercise for up to 150 minutes weekly; and  sleep for at least 7 hours a day.  6) Vitamin D deficiency -His most recent vitamin D level on 08/06/2018 was 51.  He is advised to  continue daily maintenance dose of vitamin D3 for now.  Will recheck vitamin D level prior to next visit.  7) hypokalemia: He will benefit from low-dose potassium supplement.  I discussed and prescribed potassium chloride 20 mEq p.o. daily.   - I advised patient to maintain close follow up with his PCP and he is bariatric team for ongoing care needs.   I spent 26 minutes in the care of the patient today including review of labs from CMP, Lipids, Thyroid Function, Hematology (current and previous including abstractions from other facilities); face-to-face time discussing  his blood glucose readings/logs, discussing hypoglycemia and hyperglycemia episodes and symptoms, medications doses, his options of short and long term treatment based on the latest standards of care / guidelines;  discussion about incorporating lifestyle medicine;  and documenting the encounter. Risk reduction counseling performed per USPSTF guidelines to reduce  obesity and cardiovascular risk factors.     Please refer to Patient Instructions for Blood Glucose Monitoring and Insulin/Medications  Dosing Guide"  in media tab for additional information. Please  also refer to " Patient Self Inventory" in the Media  tab for reviewed elements of pertinent patient history.  Zachary Devoid. participated in the discussions, expressed understanding, and voiced agreement with the above plans.  All questions were answered to his satisfaction. he is encouraged to contact clinic should he have any questions or concerns prior to his return visit.     Follow up plan: -Return in about 6 months (around 12/26/2022) for Fasting Labs  in AM B4 8, A1c -NV.    Ronny Bacon, [facility] [address] [place], [place] [address] Phone: [phone] Fax: [phone]  06/26/2022, 2:25 PM

## 2022-06-26 NOTE — Telephone Encounter (Signed)
Hi Dr. Chales Abrahams,  Supervising Provider: 4/17-PM    Patient called requesting a transfer of care from [facility] to [facility] stated he needs to be evaluated for diarrhea and bloating. We were able to obtained his GI records and scanned them into Media for you to review and advise on scheduling.   Thanks

## 2022-06-27 LAB — POCT GLYCOSYLATED HEMOGLOBIN (HGB A1C)

## 2022-06-27 NOTE — Telephone Encounter (Signed)
He is well-established with [facility] workup Suggest continue care or if another opinion is needed, [facility] would be the best RG

## 2022-07-03 ENCOUNTER — Other Ambulatory Visit: Payer: Self-pay | Admitting: "Endocrinology

## 2022-07-03 ENCOUNTER — Ambulatory Visit (HOSPITAL_COMMUNITY)
Admission: RE | Admit: 2022-07-03 | Discharge: 2022-07-03 | Disposition: A | Discharge status: Home / Self Care | Payer: Medicare PPO | Source: Ambulatory Visit | Attending: Urology | Admitting: Urology

## 2022-07-03 DIAGNOSIS — C61 Malignant neoplasm of prostate: Secondary | ICD-10-CM | POA: Insufficient documentation

## 2022-07-03 DIAGNOSIS — N400 Enlarged prostate without lower urinary tract symptoms: Secondary | ICD-10-CM | POA: Diagnosis not present

## 2022-07-03 DIAGNOSIS — Z96611 Presence of right artificial shoulder joint: Secondary | ICD-10-CM | POA: Diagnosis not present

## 2022-07-03 MED ORDER — GADOBUTROL 1 MMOL/ML IV SOLN
9.0000 mL | Freq: Once | INTRAVENOUS | Status: AC | PRN
  Administered 2022-07-03: 9 mL via INTRAVENOUS

## 2022-07-03 NOTE — Telephone Encounter (Signed)
Patients wife was informed of the recommendations below. Was not happy but she understood.

## 2022-07-05 DIAGNOSIS — C61 Malignant neoplasm of prostate: Secondary | ICD-10-CM | POA: Diagnosis not present

## 2022-07-05 DIAGNOSIS — N400 Enlarged prostate without lower urinary tract symptoms: Secondary | ICD-10-CM | POA: Diagnosis not present

## 2022-07-16 DIAGNOSIS — R262 Difficulty in walking, not elsewhere classified: Secondary | ICD-10-CM | POA: Diagnosis not present

## 2022-07-16 DIAGNOSIS — I453 Trifascicular block: Secondary | ICD-10-CM | POA: Diagnosis not present

## 2022-07-16 DIAGNOSIS — E1159 Type 2 diabetes mellitus with other circulatory complications: Secondary | ICD-10-CM | POA: Diagnosis not present

## 2022-07-16 DIAGNOSIS — I10 Essential (primary) hypertension: Secondary | ICD-10-CM | POA: Diagnosis not present

## 2022-07-16 DIAGNOSIS — I272 Pulmonary hypertension, unspecified: Secondary | ICD-10-CM | POA: Diagnosis not present

## 2022-07-16 DIAGNOSIS — I428 Other cardiomyopathies: Secondary | ICD-10-CM | POA: Diagnosis not present

## 2022-07-16 DIAGNOSIS — I152 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: Secondary | ICD-10-CM | POA: Diagnosis not present

## 2022-07-16 DIAGNOSIS — C61 Malignant neoplasm of prostate: Secondary | ICD-10-CM | POA: Diagnosis not present

## 2022-07-16 DIAGNOSIS — I5042 Chronic combined systolic (congestive) and diastolic (congestive) heart failure: Secondary | ICD-10-CM | POA: Diagnosis not present

## 2022-07-16 DIAGNOSIS — R931 Abnormal findings on diagnostic imaging of heart and coronary circulation: Secondary | ICD-10-CM | POA: Diagnosis not present

## 2022-07-17 DIAGNOSIS — H31102 Choroidal degeneration, unspecified, left eye: Secondary | ICD-10-CM | POA: Diagnosis not present

## 2022-07-17 DIAGNOSIS — E119 Type 2 diabetes mellitus without complications: Secondary | ICD-10-CM | POA: Diagnosis not present

## 2022-07-17 DIAGNOSIS — D3132 Benign neoplasm of left choroid: Secondary | ICD-10-CM | POA: Diagnosis not present

## 2022-07-17 DIAGNOSIS — Z961 Presence of intraocular lens: Secondary | ICD-10-CM | POA: Diagnosis not present

## 2022-07-22 ENCOUNTER — Other Ambulatory Visit: Payer: Self-pay | Admitting: "Endocrinology

## 2022-07-23 ENCOUNTER — Other Ambulatory Visit: Payer: Medicare PPO

## 2022-07-23 DIAGNOSIS — I453 Trifascicular block: Secondary | ICD-10-CM | POA: Diagnosis not present

## 2022-07-23 DIAGNOSIS — I272 Pulmonary hypertension, unspecified: Secondary | ICD-10-CM | POA: Diagnosis not present

## 2022-07-23 DIAGNOSIS — I428 Other cardiomyopathies: Secondary | ICD-10-CM | POA: Diagnosis not present

## 2022-07-23 DIAGNOSIS — E1159 Type 2 diabetes mellitus with other circulatory complications: Secondary | ICD-10-CM | POA: Diagnosis not present

## 2022-07-23 DIAGNOSIS — R262 Difficulty in walking, not elsewhere classified: Secondary | ICD-10-CM | POA: Diagnosis not present

## 2022-07-23 DIAGNOSIS — I10 Essential (primary) hypertension: Secondary | ICD-10-CM | POA: Diagnosis not present

## 2022-07-23 DIAGNOSIS — I152 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: Secondary | ICD-10-CM | POA: Diagnosis not present

## 2022-07-23 DIAGNOSIS — R931 Abnormal findings on diagnostic imaging of heart and coronary circulation: Secondary | ICD-10-CM | POA: Diagnosis not present

## 2022-07-23 DIAGNOSIS — I5042 Chronic combined systolic (congestive) and diastolic (congestive) heart failure: Secondary | ICD-10-CM | POA: Diagnosis not present

## 2022-07-29 ENCOUNTER — Ambulatory Visit: Payer: Medicare PPO | Admitting: Urology

## 2022-08-21 ENCOUNTER — Ambulatory Visit: Payer: Medicare PPO | Admitting: Podiatry

## 2022-08-21 ENCOUNTER — Ambulatory Visit (INDEPENDENT_AMBULATORY_CARE_PROVIDER_SITE_OTHER): Payer: Medicare PPO

## 2022-08-21 DIAGNOSIS — M2042 Other hammer toe(s) (acquired), left foot: Secondary | ICD-10-CM | POA: Diagnosis not present

## 2022-08-21 DIAGNOSIS — Z981 Arthrodesis status: Secondary | ICD-10-CM | POA: Diagnosis not present

## 2022-08-21 DIAGNOSIS — M79672 Pain in left foot: Secondary | ICD-10-CM

## 2022-08-21 DIAGNOSIS — R2681 Unsteadiness on feet: Secondary | ICD-10-CM

## 2022-08-21 NOTE — Progress Notes (Signed)
Chief Complaint  Patient presents with   Diabetes    Patient came in today for left foot hammertoes, 2nd, neuropathy, A1c- 6.0 BG- not taking, X-Rays done today     HPI: 72 y.o. male PMHx T2DM, last A1c 03/25/2022 7.5, with history of lumbar spinal fusion secondary to spinal stenosis presenting today as a new patient for evaluation of a few different issues today.  First the patient states that he has had neuropathy to the bilateral feet for several years.  He experiences pins-and-needles and burning sensation to the bilateral feet.  He states that it is minimally symptomatic and it really does not bother him  Patient also states because of the spinal column fusion and neuropathy he does have unsteadiness and instability of gait.  He uses a cane for ambulation.  He states that he feels as if he is in constant risk of falling.  He also has advanced osteoarthritis of the right knee which creates weakness to the right lower extremity.  Finally, the patient states that he has an asymptomatic hammertoe to the second digit of the left foot.  His spouse, who is present today, wanted to have it evaluated.  He says that it does not bother him at all and he never notices it.   Past Medical History:  Diagnosis Date   Acute ischemic stroke (HCC) 02/22/2013   CVA (cerebral infarction) 01/22/2013   Diabetes mellitus (HCC)    Difficulty in walking(719.7) 01/25/2013   History of Roux-en-Y gastric bypass    Hypertension    IMPINGEMENT SYNDROME 01/16/2009   Qualifier: Diagnosis of  By: Romeo Apple MD, Duffy Rhody     KNEE PAIN 01/16/2009   Qualifier: Diagnosis of  By: Romeo Apple MD, Stanley     Knee pain, right 09/05/2011   KNEE, ARTHRITIS, DEGEN./OSTEO 01/30/2009   Qualifier: Diagnosis of  By: Romeo Apple MD, Stanley     Lack of coordination 01/22/2013   Non-ischemic cardiomyopathy (HCC)    Osteoarthritis of knee    Other obesity due to excess calories 01/30/2015   Overweight(278.02)    Pulmonary hypertension  (HCC)    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 12/2014, Dr. Bufford Lope   S/P lumbar fusion 07/02/2013   SHOULDER PAIN 01/16/2009   Qualifier: Diagnosis of  By: Romeo Apple MD, Stanley     Shoulder pain, right    Sleep apnea    cpap   Sleep apnea    Spinal stenosis    Stroke [facility])    TENDINITIS, LEFT KNEE 01/16/2009   Qualifier: Diagnosis of  By: Romeo Apple MD, Rowe Rufino D deficiency     Past Surgical History:  Procedure Laterality Date   BACK SURGERY     CARDIAC CATHETERIZATION     gastric bypass surgery      KNEE ARTHROSCOPY     loop recorder  12/01/2018   Per [facility] Rep Chris-pt is unsafe to have any MRI-Loop recorder is end of life   10/01/2021   NASAL SEPTUM SURGERY     PROSTATE BIOPSY  2024   REVERSE SHOULDER ARTHROPLASTY Right 01/03/2022   Procedure: REVERSE SHOULDER ARTHROPLASTY;  Surgeon: Francena Hanly, MD;  Location: [facility];  Service: Orthopedics;  Laterality: Right;    Allergies  Allergen Reactions   Prednisone     Starts itching     Physical Exam: General: The patient is alert and oriented x3 in no acute distress.  Dermatology: Skin is warm, dry  and supple bilateral lower extremities.   Vascular: Palpable pedal pulses bilaterally. Capillary refill within normal limits.  No appreciable edema.  No erythema.  Neurological: Grossly intact via light touch  Musculoskeletal Exam: Hammertoe contracture second digit left foot.  There is no tenderness with palpation or range of motion to the foot.  Range of motion within normal limits.  Muscle strength 5/5 all compartments bilateral.  Radiographic Exam LT foot 08/21/2022:  Normal osseous mineralization.  Advanced degenerative changes noted throughout the pedal joints of the foot left.  No acute fractures identified.  Hammertoe contracture noted to the second digit of the left foot.  Impression: Hammertoe second digit.  Osteoarthritis around the midtarsal  joint.  Assessment/Plan of Care: 1.  T2DM with peripheral polyneuropathy  2.  PSxHx spinal column fusion secondary to spinal stenosis  3.  Osteoarthritis left midfoot  4.  Asymptomatic hammertoe second digit left  -Patient evaluated.  X-rays reviewed -Recommend good stability walking shoes.  Patient has a pair of new balance walking shoes at home but he does not wear them very often.  Patient currently wears hey dudes and crocs.  Advise against this.  -Physical therapy was also offered for the patient today to help with gait training and stability of his gait.  He would like to pursue physical therapy however he is going to be gone all summer at the beach at [place] summer home.  He will send a message to our office when he returns in August to initiate physical therapy for gait training and stability. -The hammertoe to the second digit of the left foot is completely asymptomatic.  He has no history of callus or ulcer associated to the hammertoe.  Simply observe for now -Return to clinic as needed     Felecia Shelling, DPM [facility]  Dr. Felecia Shelling, DPM    [address] [place], [place] [address]                Office [phone]  Fax [phone]

## 2022-08-22 DIAGNOSIS — I5042 Chronic combined systolic (congestive) and diastolic (congestive) heart failure: Secondary | ICD-10-CM | POA: Diagnosis not present

## 2022-08-22 DIAGNOSIS — I453 Trifascicular block: Secondary | ICD-10-CM | POA: Diagnosis not present

## 2022-08-22 DIAGNOSIS — I10 Essential (primary) hypertension: Secondary | ICD-10-CM | POA: Diagnosis not present

## 2022-08-22 DIAGNOSIS — I428 Other cardiomyopathies: Secondary | ICD-10-CM | POA: Diagnosis not present

## 2022-08-22 DIAGNOSIS — I152 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: Secondary | ICD-10-CM | POA: Diagnosis not present

## 2022-08-22 DIAGNOSIS — I272 Pulmonary hypertension, unspecified: Secondary | ICD-10-CM | POA: Diagnosis not present

## 2022-08-22 DIAGNOSIS — R262 Difficulty in walking, not elsewhere classified: Secondary | ICD-10-CM | POA: Diagnosis not present

## 2022-08-22 DIAGNOSIS — R931 Abnormal findings on diagnostic imaging of heart and coronary circulation: Secondary | ICD-10-CM | POA: Diagnosis not present

## 2022-08-22 DIAGNOSIS — E1159 Type 2 diabetes mellitus with other circulatory complications: Secondary | ICD-10-CM | POA: Diagnosis not present

## 2022-08-26 DIAGNOSIS — E782 Mixed hyperlipidemia: Secondary | ICD-10-CM | POA: Diagnosis not present

## 2022-08-26 DIAGNOSIS — R9439 Abnormal result of other cardiovascular function study: Secondary | ICD-10-CM | POA: Diagnosis not present

## 2022-08-26 DIAGNOSIS — I5042 Chronic combined systolic (congestive) and diastolic (congestive) heart failure: Secondary | ICD-10-CM | POA: Diagnosis not present

## 2022-08-26 DIAGNOSIS — E669 Obesity, unspecified: Secondary | ICD-10-CM | POA: Diagnosis not present

## 2022-08-26 DIAGNOSIS — I10 Essential (primary) hypertension: Secondary | ICD-10-CM | POA: Diagnosis not present

## 2022-08-27 DIAGNOSIS — R931 Abnormal findings on diagnostic imaging of heart and coronary circulation: Secondary | ICD-10-CM | POA: Diagnosis not present

## 2022-08-27 DIAGNOSIS — E1159 Type 2 diabetes mellitus with other circulatory complications: Secondary | ICD-10-CM | POA: Diagnosis not present

## 2022-08-27 DIAGNOSIS — I5042 Chronic combined systolic (congestive) and diastolic (congestive) heart failure: Secondary | ICD-10-CM | POA: Diagnosis not present

## 2022-08-27 DIAGNOSIS — I428 Other cardiomyopathies: Secondary | ICD-10-CM | POA: Diagnosis not present

## 2022-08-27 DIAGNOSIS — I10 Essential (primary) hypertension: Secondary | ICD-10-CM | POA: Diagnosis not present

## 2022-08-27 DIAGNOSIS — I453 Trifascicular block: Secondary | ICD-10-CM | POA: Diagnosis not present

## 2022-08-27 DIAGNOSIS — I152 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: Secondary | ICD-10-CM | POA: Diagnosis not present

## 2022-08-27 DIAGNOSIS — R262 Difficulty in walking, not elsewhere classified: Secondary | ICD-10-CM | POA: Diagnosis not present

## 2022-08-27 DIAGNOSIS — I272 Pulmonary hypertension, unspecified: Secondary | ICD-10-CM | POA: Diagnosis not present

## 2022-09-20 DIAGNOSIS — H9221 Otorrhagia, right ear: Secondary | ICD-10-CM | POA: Diagnosis not present

## 2022-09-23 ENCOUNTER — Ambulatory Visit
Admission: EM | Admit: 2022-09-23 | Discharge: 2022-09-23 | Disposition: A | Discharge status: Home / Self Care | Payer: Medicare PPO | Attending: Nurse Practitioner | Admitting: Nurse Practitioner

## 2022-09-23 DIAGNOSIS — T161XXA Foreign body in right ear, initial encounter: Secondary | ICD-10-CM

## 2022-09-23 NOTE — ED Triage Notes (Signed)
Right ear pain that started 5 days ago, pt was having some blood in his ear and decreased hearing.

## 2022-09-23 NOTE — ED Notes (Signed)
Looked in pts right ear and seen the clot close to the surface when water didn't flush it out I tried to manually get it out with the tip of the clear irrigation tip. Scraped it and it broke the blood in his ear up slightly and more began to flush out. Only small pieces of dried blood flushed out.

## 2022-09-23 NOTE — ED Provider Notes (Signed)
[facility]    CSN: [id number] Arrival date & time: 09/23/22  1643      History   Chief Complaint Chief Complaint  Patient presents with   Ear Fullness    HPI Zachary Murillo. is a 72 y.o. male.   The history is provided by the patient.   The patient presents for complaints of bleeding from the right ear.  Patient states approximately 5 days ago, he developed right ear pain.  He states that he wears hearing aids, and scratched the right ear.  He states that he noticed blood when he touched the ear, and he states that he also noticed it on his hearing aid.  Patient states that shortly thereafter, he inserted his finger in his ear and had a lot of blood on his finger.  He states that he did go by his PCPs office to have the ear examined, but he did not feel that the PA know exactly what to do for him.  Patient states that he continues to have blood in the ear with decreased hearing.  He denies fever, chills, headache, sore throat, cough, abdominal pain, nausea, vomiting, or diarrhea.  Patient reports that he does take Plavix daily.  Past Medical History:  Diagnosis Date   Acute ischemic stroke (HCC) 02/22/2013   CVA (cerebral infarction) 01/22/2013   Diabetes mellitus (HCC)    Difficulty in walking(719.7) 01/25/2013   History of Roux-en-Y gastric bypass    Hypertension    IMPINGEMENT SYNDROME 01/16/2009   Qualifier: Diagnosis of  By: Romeo Apple MD, Duffy Rhody     KNEE PAIN 01/16/2009   Qualifier: Diagnosis of  By: Romeo Apple MD, Stanley     Knee pain, right 09/05/2011   KNEE, ARTHRITIS, DEGEN./OSTEO 01/30/2009   Qualifier: Diagnosis of  By: Romeo Apple MD, Stanley     Lack of coordination 01/22/2013   Non-ischemic cardiomyopathy (HCC)    Osteoarthritis of knee    Other obesity due to excess calories 01/30/2015   Overweight(278.02)    Pulmonary hypertension (HCC)    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 12/2014, Dr.  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   Pseudophakia 04/25/2020   Bilateral sensorineural hearing loss 06/30/2019   Primary hyperaldosteronism (HCC) 03/22/2015   Type 2 diabetes mellitus with vascular disease (HCC) 01/30/2015   Mixed hyperlipidemia 01/30/2015   Essential hypertension, benign 01/30/2015   Vitamin D deficiency 01/30/2015   Obesity (BMI 30.0-34.9) 01/30/2015   Malignant hypertension 08/03/2014   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   Lumbar spinal stenosis 07/02/2013   Nonischemic cardiomyopathy (HCC) 06/21/2013   DDD (degenerative disc disease), 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/2013   Pulmonary hypertension (HCC) 03/23/2013   High aldosterone level 02/19/2013   Difficulty in walking, not elsewhere classified 01/25/2013   Cerebral infarction (HCC) 01/22/2013   Heart failure, systolic and diastolic, chronic (HCC) 12/25/2012   Abnormal echocardiogram 12/24/2012   Sleep  apnea 12/22/2012   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 12/03/2012   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   OA (osteoarthritis) of knee 09/05/2011    Past Surgical History:  Procedure Laterality Date   BACK SURGERY     CARDIAC CATHETERIZATION     gastric bypass surgery      KNEE ARTHROSCOPY     loop recorder  12/01/2018   Per [employer] [facility] Chris-pt is unsafe to have any MRI-Loop recorder is end of life   10/01/2021   NASAL SEPTUM SURGERY     PROSTATE BIOPSY  2024   REVERSE SHOULDER ARTHROPLASTY Right 01/03/2022   Procedure: REVERSE SHOULDER ARTHROPLASTY;  Surgeon: Francena Hanly, MD;  Location:  [facility];  Service: Orthopedics;  Laterality: Right;       Home Medications    Prior to Admission medications   Medication Sig Start Date End Date Taking? Authorizing Provider  ascorbic acid (VITAMIN C) 500 MG tablet Take 500 mg by mouth daily.   Yes [provider]  atorvastatin (LIPITOR) 10 MG tablet TAKE (1) TABLET BY MOUTH AT BEDTIME. 04/19/19  Yes Nida, Denman George, MD  Calcium Citrate-Vitamin D (CALCIUM + D PO) Take 600 mg by mouth daily as needed. PRN   Yes [provider]  cetirizine (ZYRTEC) 10 MG tablet Take 10 mg by mouth daily as needed for allergies.    Yes [provider]  chlorthalidone (HYGROTON) 25 MG tablet Take 25 mg by mouth daily. 05/27/22  Yes [provider]  Cholecalciferol (VITAMIN D) 125 MCG (5000 UT) CAPS Take 5,000 Units by mouth daily.   Yes [provider]  clopidogrel (PLAVIX) 75 MG tablet Take 1 tablet (75 mg total) by mouth daily. 06/07/18  Yes Mancel Bale, MD  colestipol (COLESTID) 1 g tablet Take 1 g by mouth 2 (two) times daily.   Yes [provider]  Dulaglutide (TRULICITY) 1.5 MG/0.5ML SOPN INJECT 0.5 ML INTO THE SKIN ONCE WEEKLY 07/03/22  Yes Nida, Denman George, MD  fluticasone (FLONASE) 50 MCG/ACT nasal spray Place 2 sprays into both nostrils daily as needed for allergies.   Yes [provider]  lisinopril (PRINIVIL,ZESTRIL) 40 MG tablet Take 40 mg by mouth every morning.   Yes [provider]  metFORMIN (GLUCOPHAGE) 500 MG tablet Take 1 tablet (500 mg total) by mouth 2 (two) times daily with a meal. 07/23/22  Yes Nida, Denman George, MD  Multiple Vitamins-Minerals (CENTRUM PO) Take 1 tablet by mouth every morning. [place] chewable with extra iron   Yes [provider]  sertraline (ZOLOFT) 50 MG tablet Take 1 tablet (50 mg total) by mouth daily. Patient taking differently: Take 75 mg by mouth daily. 01/20/13  Yes Kari Baars, MD  zinc gluconate 50 MG tablet Take  50 mg by mouth daily.   Yes [provider]  Blood Glucose Monitoring Suppl (ACCU-CHEK GUIDE ME) w/Device KIT 1 Piece by Does not apply route as directed. 08/16/20   Roma Kayser, MD  glucose blood (ACCU-CHEK GUIDE) test strip USE TO TEST BLOOD SUGAR FOUR TIMES DAILY. E11.65 02/18/22   Roma Kayser, MD  potassium chloride SA (KLOR-CON M) 20 MEQ tablet TAKE (1) TABLET BY MOUTH ONCE DAILY. 06/18/22   Roma Kayser, MD    Family History Family History  Problem Relation Age of Onset   Heart disease Other    Arthritis Other    Cancer Other    Asthma Other    Diabetes Other    Breast cancer Mother  Heart disease Mother    Heart disease Father    Stroke Father    Cancer Sister    Heart disease Brother    COPD Sister     Social History Social History   Tobacco Use   Smoking status: Never   Smokeless tobacco: Never  Vaping Use   Vaping status: Never Used  Substance Use Topics   Alcohol use: Yes    Comment: occ   Drug use: No     Allergies   Prednisone   Review of Systems Review of Systems Per HPI  Physical Exam Triage Vital Signs ED Triage Vitals  Encounter Vitals Group     BP 09/23/22 1748 137/71     Systolic BP Percentile --      Diastolic BP Percentile --      Pulse Rate 09/23/22 1748 64     Resp 09/23/22 1748 18     Temp 09/23/22 1748 97.9 F (36.6 C)     Temp Source 09/23/22 1748 Oral     SpO2 09/23/22 1748 98 %     Weight --      Height --      Head Circumference --      Peak Flow --      Pain Score 09/23/22 1757 0     Pain Loc --      Pain Education --      Exclude from Growth Chart --    No data found.  Updated Vital Signs BP 137/71 (BP Location: Right Arm)   Pulse 64   Temp 97.9 F (36.6 C) (Oral)   Resp 18   SpO2 98%   Visual Acuity Right Eye Distance:   Left Eye Distance:   Bilateral Distance:    Right Eye Near:   Left Eye Near:    Bilateral Near:     Physical Exam Vitals and nursing note  reviewed.  Constitutional:      General: He is not in acute distress.    Appearance: Normal appearance.  HENT:     Head: Normocephalic.     Right Ear: External ear normal. Decreased hearing noted.     Left Ear: Tympanic membrane, ear canal and external ear normal.     Ears:     Comments: Dried blood present in the right ear canal with suspected hardened cerumen.  Unable to visualize tympanic membrane due to dried blood.   Lengthy ear irrigation performed.  Complete evacuation of the obstruction accomplished.  Attempted to perform manual removal of the obstruction with curette; however, patient was unable to tolerate.  Object appears to be blood-tinged, with possible cerumen, and unknown content.  Right ear canal is without swelling, erythema, and tympanic membrane is without bulging or erythema.  Patient reports good relief of symptoms.    Nose: Nose normal.     Mouth/Throat:     Mouth: Mucous membranes are moist.  Eyes:     Extraocular Movements: Extraocular movements intact.     Conjunctiva/sclera: Conjunctivae normal.     Pupils: Pupils are equal, round, and reactive to light.  Cardiovascular:     Rate and Rhythm: Normal rate and regular rhythm.     Pulses: Normal pulses.     Heart sounds: Normal heart sounds.  Pulmonary:     Effort: Pulmonary effort is normal. No respiratory distress.     Breath sounds: Normal breath sounds. No stridor. No wheezing, rhonchi or rales.  Abdominal:     General: Bowel sounds are normal.  Palpations: Abdomen is soft.     Tenderness: There is no abdominal tenderness.  Musculoskeletal:     Cervical back: Normal range of motion.  Lymphadenopathy:     Cervical: No cervical adenopathy.  Skin:    General: Skin is warm and dry.  Neurological:     General: No focal deficit present.     Mental Status: He is alert and oriented to person, place, and time.  Psychiatric:        Mood and Affect: Mood normal.        Behavior: Behavior normal.      UC  Treatments / Results  Labs (all labs ordered are listed, but only abnormal results are displayed) Labs Reviewed - No data to display  EKG   Radiology No results found.  Procedures Procedures (including critical care time)  Medications Ordered in UC Medications - No data to display  Initial Impression / Assessment and Plan / UC Course  I have reviewed the triage vital signs and the nursing notes.  Pertinent labs & imaging results that were available during my care of the patient were reviewed by me and considered in my medical decision making (see chart for details).  The patient is well-appearing, he is in no acute distress, vital signs are stable.  Complete evacuation of foreign body performed of the patient's right ear.  Patient tolerated well.  Patient reports good improvement of hearing and relief of symptoms.  Supportive care recommendations were provided and discussed with the patient to include avoidance of sticking anything inside of the ear, over-the-counter analgesics for pain or discomfort, and keeping the ears clean and dry.  Patient was advised that he can follow-up in this clinic as needed for ear irrigation in the future.  Patient and spouse were agreeable with this plan of care and verbalized understanding.  All questions were answered.  Patient is stable for discharge.   Final Clinical Impressions(s) / UC Diagnoses   Final diagnoses:  Acute foreign body of right ear, initial encounter     Discharge Instructions      The obstruction of the right ear was completely removed.  The canal of the right ear and middle ear are intact with no signs of infection Avoid sticking anything inside of the ear such as her finger, such as Q-tips or Bobby pins. May take over-the-counter Tylenol as needed for pain or discomfort. Keep the ears clean and dry. Follow-up as needed.      ED Prescriptions   None    PDMP not reviewed this encounter.   Abran Cantor, NP 09/23/22 1951

## 2022-09-23 NOTE — Discharge Instructions (Signed)
The obstruction of the right ear was completely removed.  The canal of the right ear and middle ear are intact with no signs of infection Avoid sticking anything inside of the ear such as her finger, such as Q-tips or Bobby pins. May take over-the-counter Tylenol as needed for pain or discomfort. Keep the ears clean and dry. Follow-up as needed.

## 2022-10-01 DIAGNOSIS — C61 Malignant neoplasm of prostate: Secondary | ICD-10-CM | POA: Diagnosis not present

## 2022-10-09 DIAGNOSIS — N400 Enlarged prostate without lower urinary tract symptoms: Secondary | ICD-10-CM | POA: Diagnosis not present

## 2022-10-09 DIAGNOSIS — C61 Malignant neoplasm of prostate: Secondary | ICD-10-CM | POA: Diagnosis not present

## 2022-10-18 ENCOUNTER — Other Ambulatory Visit: Payer: Self-pay | Admitting: "Endocrinology

## 2022-10-21 ENCOUNTER — Other Ambulatory Visit: Payer: Self-pay | Admitting: "Endocrinology

## 2022-10-21 DIAGNOSIS — I110 Hypertensive heart disease with heart failure: Secondary | ICD-10-CM | POA: Diagnosis not present

## 2022-10-21 DIAGNOSIS — R9439 Abnormal result of other cardiovascular function study: Secondary | ICD-10-CM | POA: Diagnosis not present

## 2022-10-21 DIAGNOSIS — I2510 Atherosclerotic heart disease of native coronary artery without angina pectoris: Secondary | ICD-10-CM | POA: Diagnosis not present

## 2022-10-21 DIAGNOSIS — I25118 Atherosclerotic heart disease of native coronary artery with other forms of angina pectoris: Secondary | ICD-10-CM | POA: Diagnosis not present

## 2022-10-21 DIAGNOSIS — I453 Trifascicular block: Secondary | ICD-10-CM | POA: Diagnosis not present

## 2022-10-21 DIAGNOSIS — I5042 Chronic combined systolic (congestive) and diastolic (congestive) heart failure: Secondary | ICD-10-CM | POA: Diagnosis not present

## 2022-10-21 DIAGNOSIS — R931 Abnormal findings on diagnostic imaging of heart and coronary circulation: Secondary | ICD-10-CM | POA: Diagnosis not present

## 2022-10-22 ENCOUNTER — Other Ambulatory Visit: Payer: Self-pay | Admitting: "Endocrinology

## 2022-10-28 DIAGNOSIS — S80211A Abrasion, right knee, initial encounter: Secondary | ICD-10-CM | POA: Diagnosis not present

## 2022-11-01 IMAGING — CT CT HEAD W/O CM
4 series · 15 of 47 positions shown, 17 images · non-contrast
Comparison: 06/07/2018

CLINICAL DATA: Numbness at right-side of head. Trauma yesterday.
Swelling and bruising. On blood thinners.



[Series 2: head 5.00 hr40 s3 axial ibhc · axial · 0.45mm/px · z∈[-686,-566]mm · 7 of 33 slices shown, 9 images]
[im 5/33  brain]
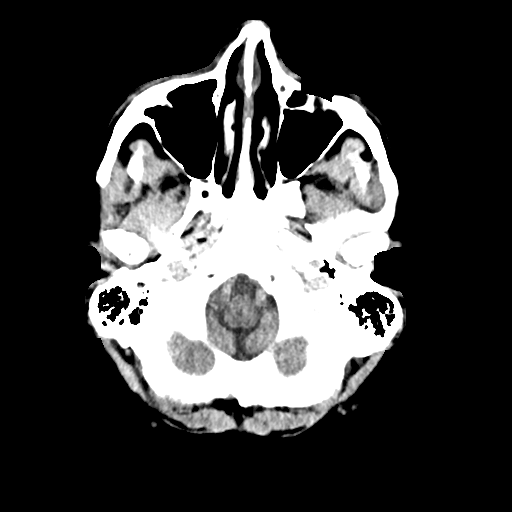
[im 5/33  bone]
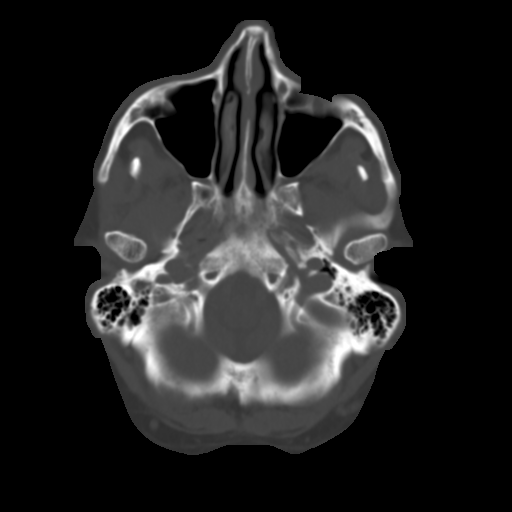
[im 9/33  brain]
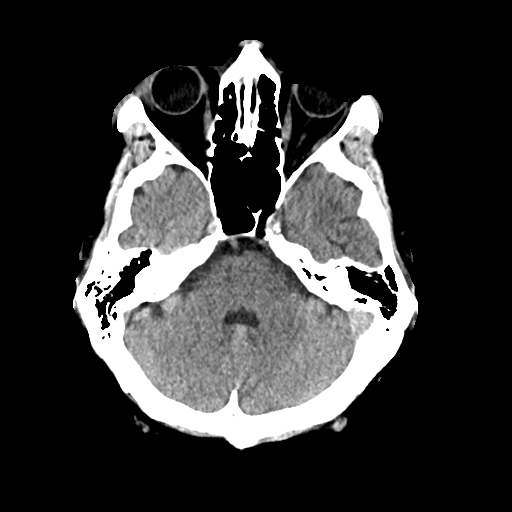
[im 13/33  brain]
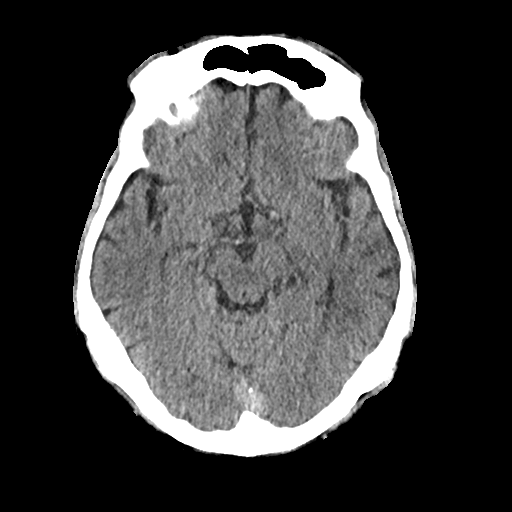
[im 17/33  brain]
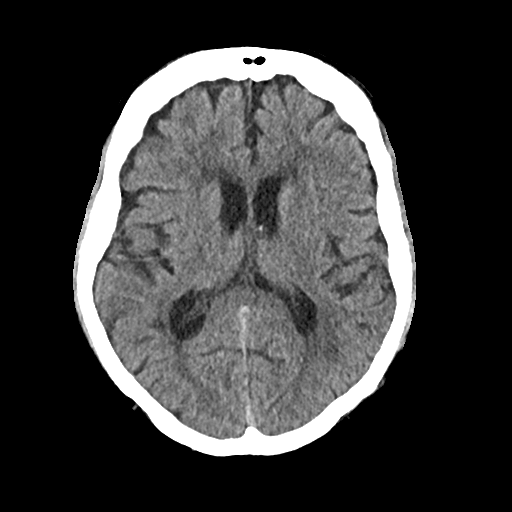
[im 21/33  brain]
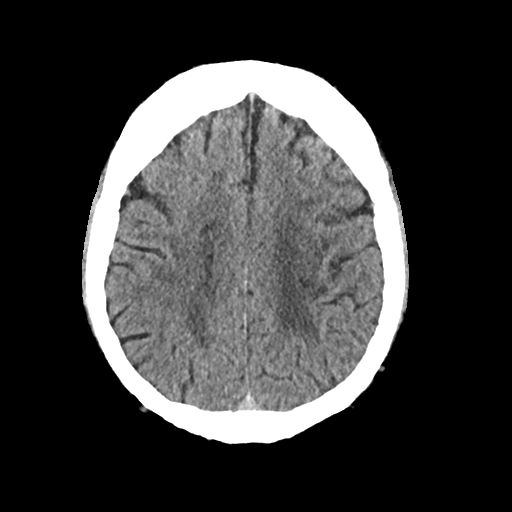
[im 21/33  bone]
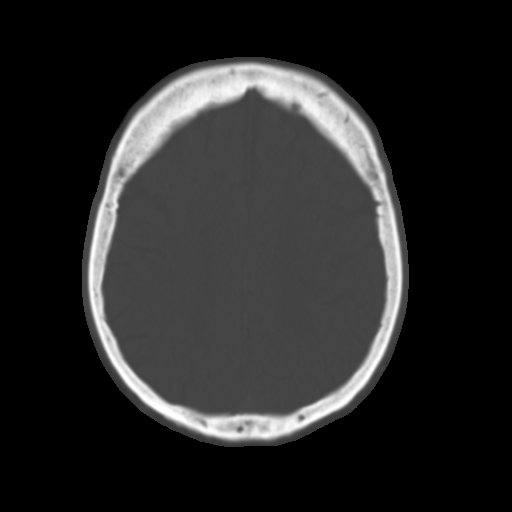
[im 25/33  brain]
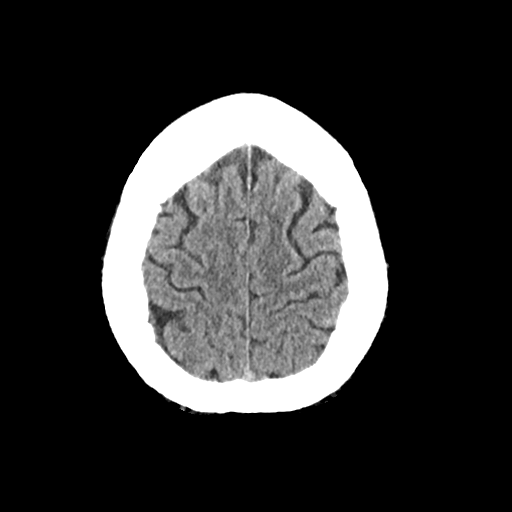
[im 29/33  brain]
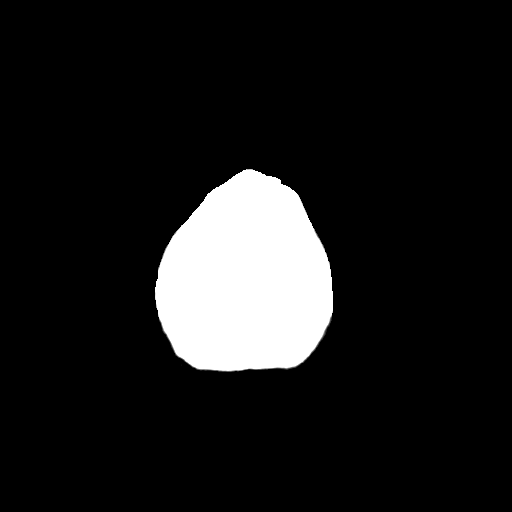

[Series 3: head 2.00 hr60 s3 axial bone · axial · 0.47mm/px · z∈[-692,-676]mm · 2 of 83 slices shown]
[im 9/83  bone]
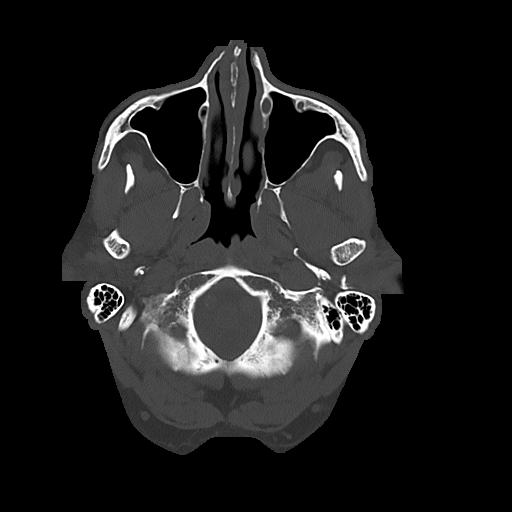
[im 17/83  bone]
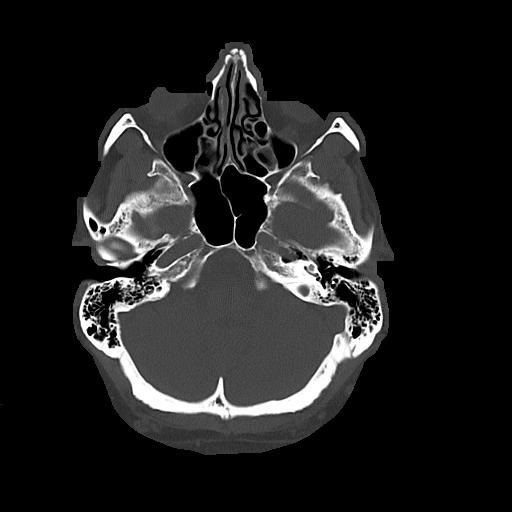

[Series 4: head 3.00 hr40 s3 sag · sagittal · 0.31mm/px · 3 of 68 slices shown]
[im 23/68  brain]
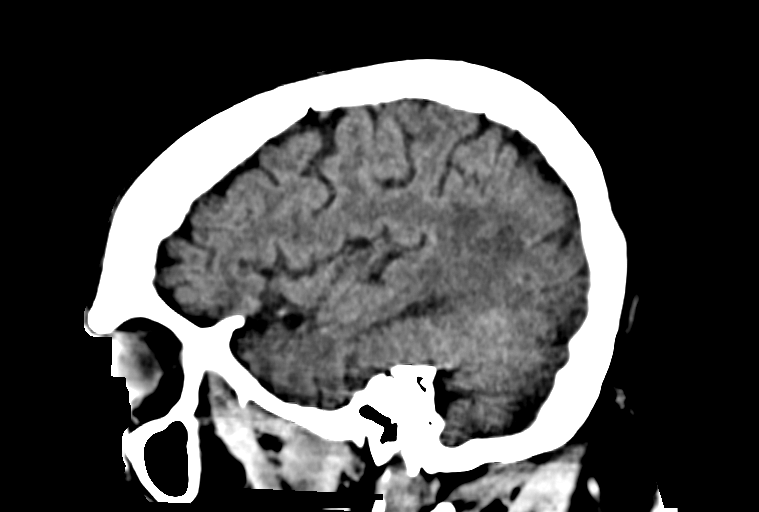
[im 34/68  brain]
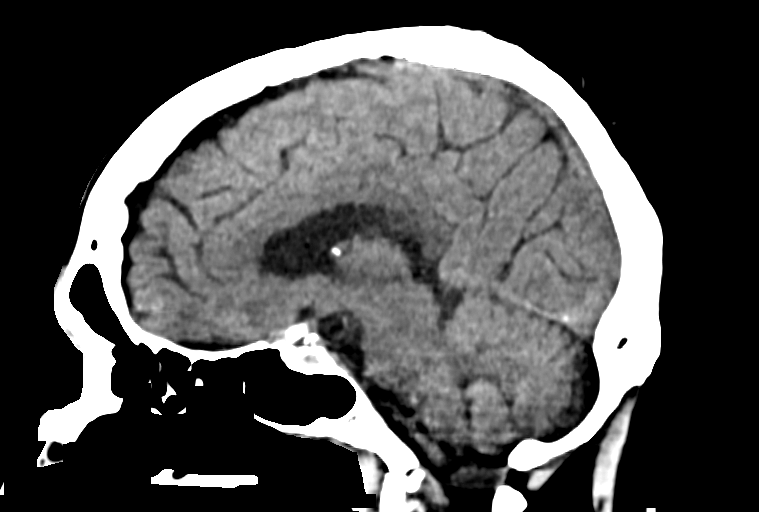
[im 45/68  brain]
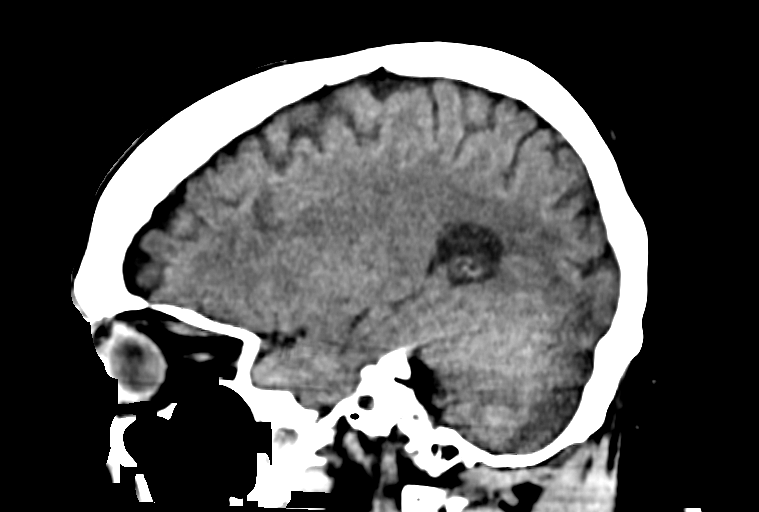

[Series 6: head 3.00 hr40 s3 cor · coronal · 0.31mm/px · 3 of 78 slices shown]
[im 26/78  brain]
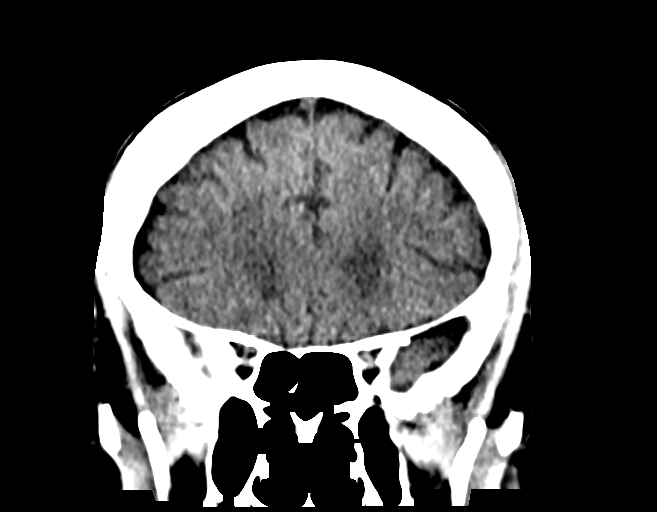
[im 35/78  brain]
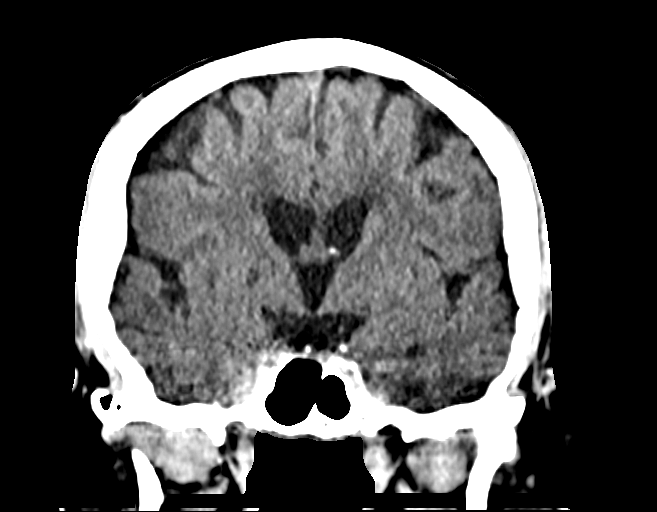
[im 43/78  brain]
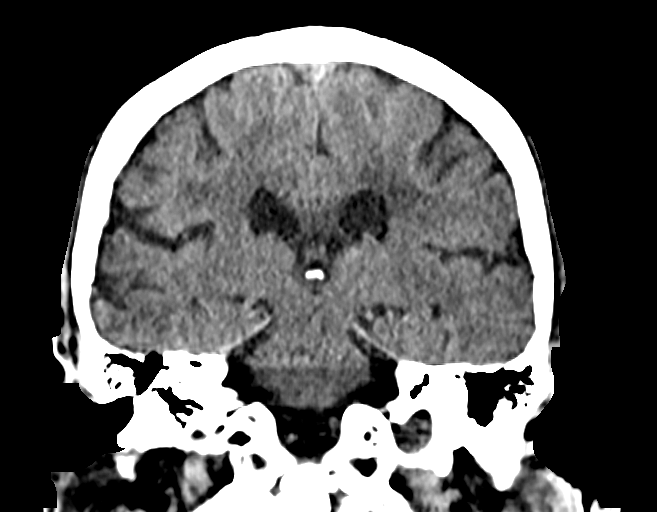

[15 of 47 positions shown; findings below may reference images not displayed]

FINDINGS: Brain: Moderate low density in the periventricular white matter
likely related to small vessel disease. No mass lesion, hemorrhage,
hydrocephalus, acute infarct, intra-axial, or extra-axial fluid
collection.

Vascular: No hyperdense vessel or unexpected calcification.
Intracranial atherosclerosis.

Skull: Normal skull.

Sinuses/Orbits: Right periorbital soft tissue swelling including on
[DATE]. Question minimal subcutaneous gas anterior to the right
superior orbit including on [DATE]. Normal orbits, without retro
bulbar hemorrhage. Minimal subluxation of the right nasal bone
medially including on [DATE]. No fluid in the paranasal sinuses or
mastoid air cells.

Other: None.
IMPRESSION: 1.  No acute intracranial abnormality.
2. Right periorbital and supraorbital soft tissue swelling with
minimal medial subluxation of the right nasal bone.
3. Small vessel ischemic change.

## 2022-11-06 DIAGNOSIS — K529 Noninfective gastroenteritis and colitis, unspecified: Secondary | ICD-10-CM | POA: Diagnosis not present

## 2022-11-06 DIAGNOSIS — R152 Fecal urgency: Secondary | ICD-10-CM | POA: Diagnosis not present

## 2022-11-06 DIAGNOSIS — Z9884 Bariatric surgery status: Secondary | ICD-10-CM | POA: Diagnosis not present

## 2022-11-12 DIAGNOSIS — M545 Low back pain, unspecified: Secondary | ICD-10-CM | POA: Diagnosis not present

## 2022-11-14 DIAGNOSIS — M545 Low back pain, unspecified: Secondary | ICD-10-CM | POA: Diagnosis not present

## 2022-11-18 DIAGNOSIS — F419 Anxiety disorder, unspecified: Secondary | ICD-10-CM | POA: Diagnosis not present

## 2022-11-19 DIAGNOSIS — M545 Low back pain, unspecified: Secondary | ICD-10-CM | POA: Diagnosis not present

## 2022-11-20 DIAGNOSIS — M545 Low back pain, unspecified: Secondary | ICD-10-CM | POA: Diagnosis not present

## 2022-11-21 DIAGNOSIS — M545 Low back pain, unspecified: Secondary | ICD-10-CM | POA: Diagnosis not present

## 2022-11-26 DIAGNOSIS — U071 COVID-19: Secondary | ICD-10-CM | POA: Diagnosis not present

## 2022-11-26 DIAGNOSIS — Z6830 Body mass index (BMI) 30.0-30.9, adult: Secondary | ICD-10-CM | POA: Diagnosis not present

## 2022-11-26 DIAGNOSIS — J36 Peritonsillar abscess: Secondary | ICD-10-CM | POA: Diagnosis not present

## 2022-11-26 DIAGNOSIS — J060 Acute laryngopharyngitis: Secondary | ICD-10-CM | POA: Diagnosis not present

## 2022-11-26 DIAGNOSIS — E669 Obesity, unspecified: Secondary | ICD-10-CM | POA: Diagnosis not present

## 2022-11-26 DIAGNOSIS — R030 Elevated blood-pressure reading, without diagnosis of hypertension: Secondary | ICD-10-CM | POA: Diagnosis not present

## 2022-11-26 DIAGNOSIS — J039 Acute tonsillitis, unspecified: Secondary | ICD-10-CM | POA: Diagnosis not present

## 2022-11-26 DIAGNOSIS — J020 Streptococcal pharyngitis: Secondary | ICD-10-CM | POA: Diagnosis not present

## 2022-11-28 DIAGNOSIS — M545 Low back pain, unspecified: Secondary | ICD-10-CM | POA: Diagnosis not present

## 2022-11-29 DIAGNOSIS — M545 Low back pain, unspecified: Secondary | ICD-10-CM | POA: Diagnosis not present

## 2022-12-02 DIAGNOSIS — Z9884 Bariatric surgery status: Secondary | ICD-10-CM | POA: Diagnosis not present

## 2022-12-04 DIAGNOSIS — M545 Low back pain, unspecified: Secondary | ICD-10-CM | POA: Diagnosis not present

## 2022-12-09 DIAGNOSIS — E1159 Type 2 diabetes mellitus with other circulatory complications: Secondary | ICD-10-CM | POA: Diagnosis not present

## 2022-12-10 ENCOUNTER — Other Ambulatory Visit: Payer: Self-pay | Admitting: "Endocrinology

## 2022-12-10 LAB — COMPREHENSIVE METABOLIC PANEL
ALT: 23 [IU]/L (ref 0–44)
AST: 23 [IU]/L (ref 0–40)
Albumin: 4.1 g/dL (ref 3.8–4.8)
Alkaline Phosphatase: 137 [IU]/L — ABNORMAL HIGH (ref 44–121)
BUN/Creatinine Ratio: 22 (ref 10–24)
BUN: 24 mg/dL (ref 8–27)
Bilirubin Total: 0.9 mg/dL (ref 0.0–1.2)
CO2: 24 mmol/L (ref 20–29)
Calcium: 9.2 mg/dL (ref 8.6–10.2)
Chloride: 103 mmol/L (ref 96–106)
Creatinine, Ser: 1.08 mg/dL (ref 0.76–1.27)
Globulin, Total: 2.4 g/dL (ref 1.5–4.5)
Glucose: 133 mg/dL — ABNORMAL HIGH (ref 70–99)
Potassium: 3.9 mmol/L (ref 3.5–5.2)
Sodium: 142 mmol/L (ref 134–144)
Total Protein: 6.5 g/dL (ref 6.0–8.5)
eGFR: 73 mL/min/{1.73_m2} (ref >59)

## 2022-12-10 LAB — LIPID PANEL
Chol/HDL Ratio: 2.4 {ratio} (ref 0.0–5.0)
Cholesterol, Total: 107 mg/dL (ref 100–199)
HDL: 45 mg/dL (ref >39)
LDL Chol Calc (NIH): 47 mg/dL (ref 0–99)
Triglycerides: 73 mg/dL (ref 0–149)
VLDL Cholesterol Cal: 15 mg/dL (ref 5–40)

## 2022-12-10 LAB — TSH: TSH: 1.83 u[IU]/mL (ref 0.450–4.500)

## 2022-12-10 LAB — T4, FREE: Free T4: 1.17 ng/dL (ref 0.82–1.77)

## 2022-12-11 DIAGNOSIS — D487 Neoplasm of uncertain behavior of other specified sites: Secondary | ICD-10-CM | POA: Diagnosis not present

## 2022-12-11 DIAGNOSIS — H35362 Drusen (degenerative) of macula, left eye: Secondary | ICD-10-CM | POA: Diagnosis not present

## 2022-12-16 ENCOUNTER — Ambulatory Visit: Payer: Medicare PPO | Admitting: "Endocrinology

## 2022-12-16 ENCOUNTER — Encounter: Payer: Self-pay | Admitting: "Endocrinology

## 2022-12-16 VITALS — BP 126/68 | HR 68 | Ht 70.0 in | Wt 211.9 lb

## 2022-12-16 DIAGNOSIS — E782 Mixed hyperlipidemia: Secondary | ICD-10-CM | POA: Diagnosis not present

## 2022-12-16 DIAGNOSIS — Z7985 Long-term (current) use of injectable non-insulin antidiabetic drugs: Secondary | ICD-10-CM | POA: Diagnosis not present

## 2022-12-16 DIAGNOSIS — E559 Vitamin D deficiency, unspecified: Secondary | ICD-10-CM | POA: Diagnosis not present

## 2022-12-16 DIAGNOSIS — I10 Essential (primary) hypertension: Secondary | ICD-10-CM | POA: Diagnosis not present

## 2022-12-16 DIAGNOSIS — E1159 Type 2 diabetes mellitus with other circulatory complications: Secondary | ICD-10-CM | POA: Diagnosis not present

## 2022-12-16 DIAGNOSIS — M545 Low back pain, unspecified: Secondary | ICD-10-CM | POA: Diagnosis not present

## 2022-12-16 LAB — POCT GLYCOSYLATED HEMOGLOBIN (HGB A1C): HbA1c, POC (controlled diabetic range): 7.3 % — AB (ref 0.0–7.0)

## 2022-12-16 MED ORDER — TIRZEPATIDE 2.5 MG/0.5ML ~~LOC~~ SOAJ: 2.5000 mg | SUBCUTANEOUS | 1 refills | Status: DC

## 2022-12-16 NOTE — Patient Instructions (Signed)
                                     Advice for Weight Management  -For most of us the best way to lose weight is by diet management. Generally speaking, diet management means consuming less calories intentionally which over time brings about progressive weight loss.  This can be achieved more effectively by avoiding ultra processed carbohydrates, processed meats, unhealthy fats.    It is critically important to know your numbers: how much calorie you are consuming and how much calorie you need. More importantly, our carbohydrates sources should be unprocessed naturally occurring  complex starch food items.  It is always important to balance nutrition also by  appropriate intake of proteins (mainly plant-based), healthy fats/oils, plenty of fruits and vegetables.   -The American College of Lifestyle Medicine (ACL M) recommends nutrition derived mostly from Whole Food, Plant Predominant Sources example an apple instead of applesauce or apple pie. Eat Plenty of vegetables, Mushrooms, fruits, Legumes, Whole Grains, Nuts, seeds in lieu of processed meats, processed snacks/pastries red meat, poultry, eggs.  Use only water or unsweetened tea for hydration.  The College also recommends the need to stay away from risky substances including alcohol, smoking; obtaining 7-9 hours of restorative sleep, at least 150 minutes of moderate intensity exercise weekly, importance of healthy social connections, and being mindful of stress and seek help when it is overwhelming.    -Sticking to a routine mealtime to eat 3 meals a day and avoiding unnecessary snacks is shown to have a big role in weight control. Under normal circumstances, the only time we burn stored energy is when we are hungry, so allow  some hunger to take place- hunger means no food between appropriate meal times, only water.  It is not advisable to starve.   -It is better to avoid simple carbohydrates including:  Cakes, Sweet Desserts, Ice Cream, Soda (diet and regular), Sweet Tea, Candies, Chips, Cookies, Store Bought Juices, Alcohol in Excess of  1-2 drinks a day, Lemonade,  Artificial Sweeteners, Doughnuts, Coffee Creamers, "Sugar-free" Products, etc, etc.  This is not a complete list.....    -Consulting with certified diabetes educators is proven to provide you with the most accurate and current information on diet.  Also, you may be  interested in discussing diet options/exchanges , we can schedule a visit with Zachary Murillo, RDN, CDE for individualized nutrition education.  -Exercise: If you are able: 30 -60 minutes a day ,4 days a week, or 150 minutes of moderate intensity exercise weekly.    The longer the better if tolerated.  Combine stretch, strength, and aerobic activities.  If you were told in the past that you have high risk for cardiovascular diseases, or if you are currently symptomatic, you may seek evaluation by your heart doctor prior to initiating moderate to intense exercise programs.                                  Additional Care Considerations for Diabetes/Prediabetes   -Diabetes  is a chronic disease.  The most important care consideration is regular follow-up with your diabetes care provider with the goal being avoiding or delaying its complications and to take advantage of advances in medications and technology.  If appropriate actions are taken early enough, type 2 diabetes can even be   reversed.  Seek information from the right source.  - Whole Food, Plant Predominant Nutrition is highly recommended: Eat Plenty of vegetables, Mushrooms, fruits, Legumes, Whole Grains, Nuts, seeds in lieu of processed meats, processed snacks/pastries red meat, poultry, eggs as recommended by American College of  Lifestyle Medicine (ACLM).  -Type 2 diabetes is known to coexist with other important comorbidities such as high blood pressure and high cholesterol.  It is critical to control not only the  diabetes but also the high blood pressure and high cholesterol to minimize and delay the risk of complications including coronary artery disease, stroke, amputations, blindness, etc.  The good news is that this diet recommendation for type 2 diabetes is also very helpful for managing high cholesterol and high blood blood pressure.  - Studies showed that people with diabetes will benefit from a class of medications known as ACE inhibitors and statins.  Unless there are specific reasons not to be on these medications, the standard of care is to consider getting one from these groups of medications at an optimal doses.  These medications are generally considered safe and proven to help protect the heart and the kidneys.    - People with diabetes are encouraged to initiate and maintain regular follow-up with eye doctors, foot doctors, dentists , and if necessary heart and kidney doctors.     - It is highly recommended that people with diabetes quit smoking or stay away from smoking, and get yearly  flu vaccine and pneumonia vaccine at least every 5 years.  See above for additional recommendations on exercise, sleep, stress management , and healthy social connections.      

## 2022-12-16 NOTE — Progress Notes (Signed)
12/16/2022  Endocrinology follow-up note   Subjective:    Patient ID: Zachary Devoid., male    DOB: 1950-07-24, PCP Delma Officer, PA   Past Medical History:  Diagnosis Date   Acute ischemic stroke (HCC) 02/22/2013   CVA (cerebral infarction) 01/22/2013   Diabetes mellitus (HCC)    Difficulty in walking(719.7) 01/25/2013   History of Roux-en-Y gastric bypass    Hypertension    IMPINGEMENT SYNDROME 01/16/2009   Qualifier: Diagnosis of  By: Romeo Apple MD, Duffy Rhody     KNEE PAIN 01/16/2009   Qualifier: Diagnosis of  By: Romeo Apple MD, Stanley     Knee pain, right 09/05/2011   KNEE, ARTHRITIS, DEGEN./OSTEO 01/30/2009   Qualifier: Diagnosis of  By: Romeo Apple MD, Stanley     Lack of coordination 01/22/2013   Non-ischemic cardiomyopathy (HCC)    Osteoarthritis of knee    Other obesity due to excess calories 01/30/2015   Overweight(278.02)    Pulmonary hypertension (HCC)    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 12/2014, Dr. Bufford Lope   S/P lumbar fusion 07/02/2013   SHOULDER PAIN 01/16/2009   Qualifier: Diagnosis of  By: Romeo Apple MD, Stanley     Shoulder pain, right    Sleep apnea    cpap   Sleep apnea    Spinal stenosis    Stroke [facility])    TENDINITIS, LEFT KNEE 01/16/2009   Qualifier: Diagnosis of  By: Romeo Apple MD, Rowe Shimon D deficiency    Past Surgical History:  Procedure Laterality Date   BACK SURGERY     CARDIAC CATHETERIZATION     gastric bypass surgery      KNEE ARTHROSCOPY     loop recorder  12/01/2018   Per [facility] Rep Chris-pt is unsafe to have any MRI-Loop recorder is end of life   10/01/2021   NASAL SEPTUM SURGERY     PROSTATE BIOPSY  2024   REVERSE SHOULDER ARTHROPLASTY Right 01/03/2022   Procedure: REVERSE SHOULDER ARTHROPLASTY;  Surgeon: Francena Hanly, MD;  Location: [facility];  Service: Orthopedics;  Laterality: Right;   Social History   Socioeconomic History   Marital status: Married     Spouse name: Inetta Fermo    Number of children: 1   Years of education: Masters   Highest education level: Not on file  Occupational History   Occupation: Retired-Drivers Ed   Tobacco Use   Smoking status: Never   Smokeless tobacco: Never  Vaping Use   Vaping status: Never Used  Substance and Sexual Activity   Alcohol use: Yes    Comment: occ   Drug use: No   Sexual activity: Yes    Birth control/protection: Surgical  Other Topics Concern   Not on file  Social History Narrative   Lives with wife-42 years   One daughter who lives in [place] grandchildren, one expecting      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, Research scientist (life sciences) racing, fishing-Cubs and Acupuncturist      Diet: eats all food groups   Caffeine: 2 cups of coffee daily, tea every other day   Water: 2-3 cups daily      Wears seat belt   Does not use phone while driving    Psychologist, sport and exercise             Social Determinants of Health   Financial Resource Strain: Not on file  Food Insecurity: Not on file  Transportation Needs:  Not on file  Physical Activity: Not on file  Stress: Not on file  Social Connections: Not on file   Outpatient Encounter Medications as of 12/16/2022  Medication Sig   ascorbic acid (VITAMIN C) 500 MG tablet Take 500 mg by mouth daily.   atorvastatin (LIPITOR) 10 MG tablet TAKE (1) TABLET BY MOUTH AT BEDTIME.   Blood Glucose Monitoring Suppl (ACCU-CHEK GUIDE ME) w/Device KIT 1 Piece by Does not apply route as directed.   Calcium Citrate-Vitamin D (CALCIUM + D PO) Take 600 mg by mouth daily as needed. PRN   cetirizine (ZYRTEC) 10 MG tablet Take 10 mg by mouth daily as needed for allergies.    chlorthalidone (HYGROTON) 25 MG tablet Take 25 mg by mouth daily.   Cholecalciferol (VITAMIN D) 125 MCG (5000 UT) CAPS Take 5,000 Units by mouth daily.   clopidogrel (PLAVIX) 75 MG tablet Take 1 tablet (75 mg total) by mouth daily.   colestipol (COLESTID) 1 g tablet Take 1 g by mouth 2 (two) times  daily.   Dulaglutide (TRULICITY) 1.5 MG/0.5ML SOPN INJECT 0.5 ML INTO THE SKIN ONCE WEEKLY   fluticasone (FLONASE) 50 MCG/ACT nasal spray Place 2 sprays into both nostrils daily as needed for allergies.   glucose blood (ACCU-CHEK GUIDE) test strip USE TO TEST BLOOD SUGAR FOUR TIMES DAILY. E11.65   lisinopril (PRINIVIL,ZESTRIL) 40 MG tablet Take 40 mg by mouth every morning.   metFORMIN (GLUCOPHAGE) 500 MG tablet Take 1 tablet (500 mg total) by mouth 2 (two) times daily with a meal.   Multiple Vitamins-Minerals (CENTRUM PO) Take 1 tablet by mouth every morning. Flintstone chewable with extra iron   potassium chloride SA (KLOR-CON M) 20 MEQ tablet TAKE (1) TABLET BY MOUTH ONCE DAILY.   sertraline (ZOLOFT) 50 MG tablet Take 1 tablet (50 mg total) by mouth daily. (Patient taking differently: Take 75 mg by mouth daily.)   zinc gluconate 50 MG tablet Take 50 mg by mouth daily.   No facility-administered encounter medications on file as of 12/16/2022.   ALLERGIES: Allergies  Allergen Reactions   Prednisone     Starts itching   VACCINATION STATUS: Immunization History  Administered Date(s) Administered   Influenza, High Dose Seasonal PF 11/29/2016, 01/20/2018   Influenza, Seasonal, Injecte, Preservative Fre 12/27/2012   Influenza,inj,Quad PF,6+ Mos 01/06/2015   Influenza-Unspecified 12/23/2013, 12/25/2015, 11/19/2018   PFIZER(Purple Top)SARS-COV-2 Vaccination 04/03/2019, 04/24/2019, 12/30/2019   Zoster, Live 04/25/2014    Diabetes He presents for his follow-up diabetic visit. He has type 2 diabetes mellitus. Onset time: He was diagnosed at approximate age of 39 years. His disease course has been worsening. There are no hypoglycemic associated symptoms. Pertinent negatives for hypoglycemia include no confusion, headaches, pallor or seizures. Pertinent negatives for diabetes include no chest pain, no fatigue, no polydipsia, no polyphagia, no polyuria and no weakness. There are no hypoglycemic  complications. Symptoms are worsening. Diabetic complications include a CVA and heart disease. Risk factors for coronary artery disease include diabetes mellitus, dyslipidemia, hypertension, male sex, obesity and sedentary lifestyle. Current diabetic treatment includes oral agent (monotherapy) (Trulicity 1.5 weekly). He is compliant with treatment all of the time. His weight is increasing steadily. He has had a previous visit with a dietitian. He participates in exercise intermittently. His home blood glucose trend is increasing steadily. [name] presents with slight worsening of his profile with point-of-care A1c of 7.3% increasing from 6%.     ) An ACE inhibitor/angiotensin II receptor blocker is being taken. He sees a  podiatrist.Eye exam is current.  Hyperlipidemia This is a chronic problem. The current episode started more than 1 year ago. The problem is controlled. Recent lipid tests were reviewed and are normal. Exacerbating diseases include chronic renal disease, diabetes and obesity. Pertinent negatives include no chest pain, myalgias or shortness of breath. Current antihyperlipidemic treatment includes statins. The current treatment provides moderate improvement of lipids. Compliance problems include adherence to exercise.  Risk factors for coronary artery disease include diabetes mellitus, dyslipidemia, hypertension, male sex, a sedentary lifestyle, obesity and family history.  Hypertension This is a chronic problem. The current episode started more than 1 year ago. The problem has been gradually improving since onset. The problem is controlled. Pertinent negatives include no chest pain, headaches, neck pain, palpitations or shortness of breath. There are no associated agents to hypertension. Risk factors for coronary artery disease include diabetes mellitus, dyslipidemia, male gender, obesity and sedentary lifestyle. Past treatments include ACE inhibitors, diuretics and calcium channel blockers. The  current treatment provides moderate improvement. Compliance problems include exercise and diet.  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 Identifiable causes of hypertension include chronic renal disease and hyperaldosteronism.    Review of systems  Constitutional: + Lost 4 pounds since last visit, current Body mass index is 30.4 kg/m. , no fatigue, no subjective hyperthermia, no subjective hypothermia    Objective:    BP 126/68   Pulse 68   Ht 5\' 10"  (1.778 m)   Wt 211 lb 14.4 oz (96.1 kg)   BMI 30.40 kg/m   Wt Readings from Last 3 Encounters:  12/16/22 211 lb 14.4 oz (96.1 kg)  06/26/22 206 lb 12.8 oz (93.8 kg)  05/13/22 205 lb (93 kg)    BP Readings from Last 3 Encounters:  12/16/22 126/68  09/23/22 137/71  06/26/22 116/68     Physical Exam- Limited  Constitutional:  Body mass index is 30.4 kg/m. , not in acute distress, normal state of mind   Results for orders placed or performed in visit on 06/26/22  Comprehensive metabolic panel  Result Value Ref Range   Glucose 133 (H) 70 - 99 mg/dL   BUN 24 8 - 27 mg/dL   Creatinine, Ser 6.96 0.76 - 1.27 mg/dL   eGFR 73 >29 BM/WUX/3.24   BUN/Creatinine Ratio 22 10 - 24   Sodium 142 134 - 144 mmol/L   Potassium 3.9 3.5 - 5.2 mmol/L   Chloride 103 96 - 106 mmol/L   CO2 24 20 - 29 mmol/L   Calcium 9.2 8.6 - 10.2 mg/dL   Total Protein 6.5 6.0 - 8.5 g/dL   Albumin 4.1 3.8 - 4.8 g/dL   Globulin, Total 2.4 1.5 - 4.5 g/dL   Bilirubin Total 0.9 0.0 - 1.2 mg/dL   Alkaline Phosphatase 137 (H) 44 - 121 IU/L   AST 23 0 - 40 IU/L   ALT 23 0 - 44 IU/L  Lipid panel  Result Value Ref Range   Cholesterol, Total 107 100 - 199 mg/dL   Triglycerides 73 0 - 149 mg/dL   HDL 45 >40 mg/dL   VLDL Cholesterol Cal 15 5 - 40 mg/dL   LDL Chol Calc (NIH) 47 0 - 99 mg/dL   Chol/HDL Ratio 2.4 0.0 - 5.0 ratio  TSH  Result Value Ref Range   TSH 1.830 0.450 - 4.500 uIU/mL  T4, free  Result Value Ref Range   Free T4 1.17 0.82 -  1.77 ng/dL  HgB N0U  Result Value Ref Range  Hemoglobin A1C     HbA1c POC (<> result, manual entry)     HbA1c, POC (prediabetic range)     HbA1c, POC (controlled diabetic range)     Diabetic Labs (most recent): Lab Results  Component Value Date   HGBA1C 7.5 (A) 03/25/2022   HGBA1C 7.3 (H) 12/25/2021   HGBA1C 7.1 (A) 12/20/2021      Latest Ref Rng & Units 12/09/2022    9:16 AM 03/20/2022    8:18 AM 12/25/2021    9:53 AM  CMP  Glucose 70 - 99 mg/dL 161  096  045   BUN 8 - 27 mg/dL 24  18  20    Creatinine 0.76 - 1.27 mg/dL 4.09  8.11  9.14   Sodium 134 - 144 mmol/L 142  140  139   Potassium 3.5 - 5.2 mmol/L 3.9  3.5  3.8   Chloride 96 - 106 mmol/L 103  100  102   CO2 20 - 29 mmol/L 24  25  29    Calcium 8.6 - 10.2 mg/dL 9.2  9.1  9.4   Total Protein 6.0 - 8.5 g/dL 6.5  6.4    Total Bilirubin 0.0 - 1.2 mg/dL 0.9  0.7    Alkaline Phos 44 - 121 IU/L 137  169    AST 0 - 40 IU/L 23  23    ALT 0 - 44 IU/L 23  23     Lipid Panel     Component Value Date/Time   CHOL 107 12/09/2022 0916   TRIG 73 12/09/2022 0916   HDL 45 12/09/2022 0916   CHOLHDL 2.4 12/09/2022 0916   CHOLHDL 2.2 01/04/2016 0745   VLDL 13 01/04/2016 0745   LDLCALC 47 12/09/2022 0916   LABVLDL 15 12/09/2022 0916     Assessment & Plan:   1) Type 2 diabetes mellitus with vascular disease (HCC)  His diabetes is complicated by CVA and patient remains at a high risk for more acute and chronic complications of diabetes which include CAD, CVA, CKD, retinopathy, and neuropathy. He was diagnosed at approximate age of 85 years.  [name] presents with slight worsening of his profile with point-of-care A1c of 7.3% increasing from 6%.   -He maintains 140 pounds of weight loss status post his Roux-en-Y bypass surgery.  He denies any hypoglycemia.    Recent labs reviewed. This patient continues to benefit from lifestyle medicine.   - he acknowledges that there is a room for improvement in his food and drink choices. -  Suggestion is made for him to avoid simple carbohydrates  from his diet including Cakes, Sweet Desserts, Ice Cream, Soda (diet and regular), Sweet Tea, Candies, Chips, Cookies, Store Bought Juices, Alcohol , Artificial Sweeteners,  Coffee Creamer, and "Sugar-free" Products, Lemonade. This will help patient to have more stable blood glucose profile and potentially avoid unintended weight gain.  The following Lifestyle Medicine recommendations according to American College of Lifestyle Medicine  [facility]) were discussed and and offered to patient and he  agrees to start the journey:  A. Whole Foods, Plant-Based Nutrition comprising of fruits and vegetables, plant-based proteins, whole-grain carbohydrates was discussed in detail with the patient.   A list for source of those nutrients were also provided to the patient.  Patient will use only water or unsweetened tea for hydration. B.  The need to stay away from risky substances including alcohol, smoking; obtaining 7 to 9 hours of restorative sleep, at least 150 minutes of moderate intensity exercise weekly,  the importance of healthy social connections,  and stress management techniques were discussed. C.  A full color page of  Calorie density of various food groups per pound showing examples of each food groups was provided to the patient.  - Patient is advised to stick to a routine mealtimes to eat 3 meals  a day and avoid unnecessary snacks ( to snack only to correct hypoglycemia).  - I have approached patient with the following individualized plan to manage diabetes and patient agrees.  - After a successful Roux-en-Y bariatric surgery and lost approximately 145 pounds, he was able to come off of multiple daily injections of insulin therapy. His presentation is consistent with slight loss of control in his glycemic profile.  He would benefit from [facility].  I discussed and initiated Mounjaro 2.5 mg subcutaneously weekly to replace his Trulicity.  He is  advised to continue metformin 500 mg p.o. twice daily.  He is approached to monitor blood glucose twice a day-daily before breakfast and at bedtime.  He is encouraged to call clinic for hypoglycemia under 70 or hyperglycemia above 200 mg/day   - Patient specific target  for A1c; LDL, HDL, Triglycerides,  were discussed in detail.  2) BP/HTN:  -His blood pressure is controlled to target.  His current blood pressure medications include lisinopril 40 mg p.o. daily at breakfast..  3) Lipids/HPL:  His most recent lipid panel showed improved lipid profile with LDL at 47.  She is currently on Lipitor 10 mg p.o. nightly.  He is advised to continue.  Side effects and precautions discussed with him.   4)  Weight/Diet:  His Body mass index is 30.4 kg/m. He is status post Roux-en-Y bariatric surgery.  He is still a candidate for modest weight loss.  The above detailed lifestyle medicine will help control weight, exercise, and carbohydrates information provided.  He is approached to reengage in routine physical activity to prevent further weight gain.  5) Chronic Care/Health Maintenance: -Patient is on ACEI/ARB and Statin medications and encouraged to continue to follow up with Ophthalmology, Podiatrist at least yearly or according to recommendations, and advised to  stay away from smoking. I have recommended yearly flu vaccine and pneumonia vaccination at least every 5 years; moderate intensity exercise for up to 150 minutes weekly; and  sleep for at least 7 hours a day.  6) Vitamin D deficiency -His most recent vitamin D level on 08/06/2018 was 51.  He is advised to continue daily maintenance dose of vitamin D3 for now.  Will recheck vitamin D level prior to next visit.   - I advised patient to maintain close follow up with his PCP and he is bariatric team for ongoing care needs.   I spent  26  minutes in the care of the patient today including review of labs from CMP, Lipids, Thyroid Function,  Hematology (current and previous including abstractions from other facilities); face-to-face time discussing  his blood glucose readings/logs, discussing hypoglycemia and hyperglycemia episodes and symptoms, medications doses, his options of short and long term treatment based on the latest standards of care / guidelines;  discussion about incorporating lifestyle medicine;  and documenting the encounter. Risk reduction counseling performed per USPSTF guidelines to reduce  obesity and cardiovascular risk factors.     Please refer to Patient Instructions for Blood Glucose Monitoring and Insulin/Medications Dosing Guide"  in media tab for additional information. Please  also refer to " Patient Self Inventory" in the Media  tab for reviewed elements  of pertinent patient history.  Zachary Devoid. participated in the discussions, expressed understanding, and voiced agreement with the above plans.  All questions were answered to his satisfaction. he is encouraged to contact clinic should he have any questions or concerns prior to his return visit.    Follow up plan: -No follow-ups on file.    Ronny Bacon, [facility] [address] [place], [place] [address] Phone: [phone] Fax: [phone]  12/16/2022, 9:15 AM

## 2022-12-20 DIAGNOSIS — M545 Low back pain, unspecified: Secondary | ICD-10-CM | POA: Diagnosis not present

## 2023-01-08 DIAGNOSIS — H6691 Otitis media, unspecified, right ear: Secondary | ICD-10-CM | POA: Diagnosis not present

## 2023-01-08 DIAGNOSIS — E1151 Type 2 diabetes mellitus with diabetic peripheral angiopathy without gangrene: Secondary | ICD-10-CM | POA: Diagnosis not present

## 2023-01-08 DIAGNOSIS — E1165 Type 2 diabetes mellitus with hyperglycemia: Secondary | ICD-10-CM | POA: Diagnosis not present

## 2023-01-13 DIAGNOSIS — C61 Malignant neoplasm of prostate: Secondary | ICD-10-CM | POA: Diagnosis not present

## 2023-01-15 DIAGNOSIS — Z9884 Bariatric surgery status: Secondary | ICD-10-CM | POA: Diagnosis not present

## 2023-01-15 DIAGNOSIS — E1151 Type 2 diabetes mellitus with diabetic peripheral angiopathy without gangrene: Secondary | ICD-10-CM | POA: Diagnosis not present

## 2023-01-15 DIAGNOSIS — Z8679 Personal history of other diseases of the circulatory system: Secondary | ICD-10-CM | POA: Diagnosis not present

## 2023-01-15 DIAGNOSIS — M545 Low back pain, unspecified: Secondary | ICD-10-CM | POA: Diagnosis not present

## 2023-01-15 DIAGNOSIS — I152 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: Secondary | ICD-10-CM | POA: Diagnosis not present

## 2023-01-15 DIAGNOSIS — E2609 Other primary hyperaldosteronism: Secondary | ICD-10-CM | POA: Diagnosis not present

## 2023-01-17 ENCOUNTER — Other Ambulatory Visit: Payer: Self-pay | Admitting: Urology

## 2023-01-17 DIAGNOSIS — C61 Malignant neoplasm of prostate: Secondary | ICD-10-CM

## 2023-01-21 DIAGNOSIS — M545 Low back pain, unspecified: Secondary | ICD-10-CM | POA: Diagnosis not present

## 2023-01-22 DIAGNOSIS — H903 Sensorineural hearing loss, bilateral: Secondary | ICD-10-CM | POA: Diagnosis not present

## 2023-01-23 DIAGNOSIS — M545 Low back pain, unspecified: Secondary | ICD-10-CM | POA: Diagnosis not present

## 2023-01-27 DIAGNOSIS — I151 Hypertension secondary to other renal disorders: Secondary | ICD-10-CM | POA: Diagnosis not present

## 2023-01-27 DIAGNOSIS — G4733 Obstructive sleep apnea (adult) (pediatric): Secondary | ICD-10-CM | POA: Diagnosis not present

## 2023-01-27 DIAGNOSIS — I158 Other secondary hypertension: Secondary | ICD-10-CM | POA: Diagnosis not present

## 2023-01-27 DIAGNOSIS — Z9181 History of falling: Secondary | ICD-10-CM | POA: Diagnosis not present

## 2023-01-27 DIAGNOSIS — J3089 Other allergic rhinitis: Secondary | ICD-10-CM | POA: Diagnosis not present

## 2023-01-27 DIAGNOSIS — Z0000 Encounter for general adult medical examination without abnormal findings: Secondary | ICD-10-CM | POA: Diagnosis not present

## 2023-01-27 DIAGNOSIS — Z8673 Personal history of transient ischemic attack (TIA), and cerebral infarction without residual deficits: Secondary | ICD-10-CM | POA: Diagnosis not present

## 2023-01-27 DIAGNOSIS — Z1331 Encounter for screening for depression: Secondary | ICD-10-CM | POA: Diagnosis not present

## 2023-01-27 DIAGNOSIS — Z9884 Bariatric surgery status: Secondary | ICD-10-CM | POA: Diagnosis not present

## 2023-01-27 DIAGNOSIS — Z23 Encounter for immunization: Secondary | ICD-10-CM | POA: Diagnosis not present

## 2023-01-27 DIAGNOSIS — K529 Noninfective gastroenteritis and colitis, unspecified: Secondary | ICD-10-CM | POA: Diagnosis not present

## 2023-01-27 DIAGNOSIS — E119 Type 2 diabetes mellitus without complications: Secondary | ICD-10-CM | POA: Diagnosis not present

## 2023-01-27 DIAGNOSIS — F419 Anxiety disorder, unspecified: Secondary | ICD-10-CM | POA: Diagnosis not present

## 2023-01-29 ENCOUNTER — Other Ambulatory Visit: Payer: Self-pay | Admitting: "Endocrinology

## 2023-01-29 DIAGNOSIS — M545 Low back pain, unspecified: Secondary | ICD-10-CM | POA: Diagnosis not present

## 2023-02-03 DIAGNOSIS — M545 Low back pain, unspecified: Secondary | ICD-10-CM | POA: Diagnosis not present

## 2023-02-11 DIAGNOSIS — M545 Low back pain, unspecified: Secondary | ICD-10-CM | POA: Diagnosis not present

## 2023-02-19 DIAGNOSIS — M545 Low back pain, unspecified: Secondary | ICD-10-CM | POA: Diagnosis not present

## 2023-03-03 ENCOUNTER — Encounter: Payer: Self-pay | Admitting: Urology

## 2023-03-17 ENCOUNTER — Other Ambulatory Visit: Payer: Medicare PPO

## 2023-03-24 ENCOUNTER — Other Ambulatory Visit: Payer: Self-pay | Admitting: "Endocrinology

## 2023-03-24 ENCOUNTER — Telehealth: Payer: Self-pay | Admitting: "Endocrinology

## 2023-03-24 MED ORDER — TRULICITY 0.75 MG/0.5ML ~~LOC~~ SOAJ: 0.7500 mg | SUBCUTANEOUS | 0 refills | Status: DC

## 2023-03-24 NOTE — Telephone Encounter (Signed)
 Pts wife called stating Greggory Keen is no longer covered by their insurance plan.  Wanting to know what other options they may have.

## 2023-03-24 NOTE — Telephone Encounter (Signed)
 Spoke with pt's wife advising her pt could return to taking Trulicity per Dr.Nida. Understanding voiced and stated pt would like to do so.

## 2023-03-24 NOTE — Telephone Encounter (Signed)
 Pts

## 2023-03-24 NOTE — Telephone Encounter (Signed)
 Noted.

## 2023-03-31 DIAGNOSIS — M545 Low back pain, unspecified: Secondary | ICD-10-CM | POA: Diagnosis not present

## 2023-04-01 ENCOUNTER — Inpatient Hospital Stay: Admission: RE | Admit: 2023-04-01 | Payer: Medicare PPO | Source: Ambulatory Visit

## 2023-04-02 ENCOUNTER — Telehealth: Payer: Self-pay

## 2023-04-02 DIAGNOSIS — E1159 Type 2 diabetes mellitus with other circulatory complications: Secondary | ICD-10-CM

## 2023-04-02 MED ORDER — MOUNJARO 2.5 MG/0.5ML ~~LOC~~ SOAJ: 2.5000 mg | SUBCUTANEOUS | 2 refills | Status: DC

## 2023-04-02 NOTE — Telephone Encounter (Signed)
Pt's wife called stating she had spoken with pt's insurance company and was told a prior authorization would be needed for Trulicity and Mounjaro. Asked which you prefer.

## 2023-04-02 NOTE — Telephone Encounter (Signed)
Spoke with pt's wife advising her that Dr.Nida prefers [place]. Understanding voiced. Rx for Mounjaro 2.5mg  SQ weekly sent to [facility] per Dr.Nida's orders.

## 2023-04-03 DIAGNOSIS — M545 Low back pain, unspecified: Secondary | ICD-10-CM | POA: Diagnosis not present

## 2023-04-07 DIAGNOSIS — M545 Low back pain, unspecified: Secondary | ICD-10-CM | POA: Diagnosis not present

## 2023-04-10 DIAGNOSIS — M545 Low back pain, unspecified: Secondary | ICD-10-CM | POA: Diagnosis not present

## 2023-04-15 DIAGNOSIS — C61 Malignant neoplasm of prostate: Secondary | ICD-10-CM | POA: Diagnosis not present

## 2023-04-16 ENCOUNTER — Ambulatory Visit
Admission: RE | Admit: 2023-04-16 | Discharge: 2023-04-16 | Disposition: A | Discharge status: Home / Self Care | Payer: Medicare PPO | Source: Ambulatory Visit | Attending: Urology | Admitting: Urology

## 2023-04-16 DIAGNOSIS — R972 Elevated prostate specific antigen [PSA]: Secondary | ICD-10-CM | POA: Diagnosis not present

## 2023-04-16 DIAGNOSIS — C61 Malignant neoplasm of prostate: Secondary | ICD-10-CM

## 2023-04-16 MED ORDER — GADOPICLENOL 0.5 MMOL/ML IV SOLN
10.0000 mL | Freq: Once | INTRAVENOUS | Status: AC | PRN
  Administered 2023-04-16: 9 mL via INTRAVENOUS

## 2023-04-18 DIAGNOSIS — M545 Low back pain, unspecified: Secondary | ICD-10-CM | POA: Diagnosis not present

## 2023-04-20 ENCOUNTER — Other Ambulatory Visit: Payer: Medicare PPO

## 2023-04-23 DIAGNOSIS — M545 Low back pain, unspecified: Secondary | ICD-10-CM | POA: Diagnosis not present

## 2023-04-24 DIAGNOSIS — C61 Malignant neoplasm of prostate: Secondary | ICD-10-CM | POA: Diagnosis not present

## 2023-04-24 DIAGNOSIS — N400 Enlarged prostate without lower urinary tract symptoms: Secondary | ICD-10-CM | POA: Diagnosis not present

## 2023-04-29 ENCOUNTER — Ambulatory Visit: Payer: Medicare PPO | Admitting: "Endocrinology

## 2023-04-29 ENCOUNTER — Other Ambulatory Visit: Payer: Self-pay | Admitting: "Endocrinology

## 2023-04-29 ENCOUNTER — Encounter: Payer: Self-pay | Admitting: "Endocrinology

## 2023-04-29 VITALS — BP 120/74 | HR 64 | Ht 70.0 in | Wt 212.0 lb

## 2023-04-29 DIAGNOSIS — E1159 Type 2 diabetes mellitus with other circulatory complications: Secondary | ICD-10-CM

## 2023-04-29 DIAGNOSIS — Z7985 Long-term (current) use of injectable non-insulin antidiabetic drugs: Secondary | ICD-10-CM

## 2023-04-29 DIAGNOSIS — E782 Mixed hyperlipidemia: Secondary | ICD-10-CM

## 2023-04-29 DIAGNOSIS — I10 Essential (primary) hypertension: Secondary | ICD-10-CM

## 2023-04-29 DIAGNOSIS — E559 Vitamin D deficiency, unspecified: Secondary | ICD-10-CM

## 2023-04-29 LAB — POCT GLYCOSYLATED HEMOGLOBIN (HGB A1C): HbA1c, POC (controlled diabetic range): 6.8 % (ref 0.0–7.0)

## 2023-04-29 MED ORDER — TIRZEPATIDE 5 MG/0.5ML ~~LOC~~ SOAJ: 5.0000 mg | SUBCUTANEOUS | 1 refills | Status: DC

## 2023-04-29 NOTE — Progress Notes (Signed)
04/29/2023  Endocrinology follow-up note   Subjective:    Patient ID: Zachary Devoid., male    DOB: December 01, 1950, PCP Delma Officer, PA   Past Medical History:  Diagnosis Date   Acute ischemic stroke (HCC) 02/22/2013   CVA (cerebral infarction) 01/22/2013   Diabetes mellitus (HCC)    Difficulty in walking(719.7) 01/25/2013   History of Roux-en-Y gastric bypass    Hypertension    IMPINGEMENT SYNDROME 01/16/2009   Qualifier: Diagnosis of  By: Romeo Apple MD, Duffy Rhody     KNEE PAIN 01/16/2009   Qualifier: Diagnosis of  By: Romeo Apple MD, Stanley     Knee pain, right 09/05/2011   KNEE, ARTHRITIS, DEGEN./OSTEO 01/30/2009   Qualifier: Diagnosis of  By: Romeo Apple MD, Stanley     Lack of coordination 01/22/2013   Non-ischemic cardiomyopathy (HCC)    Osteoarthritis of knee    Other obesity due to excess calories 01/30/2015   Overweight(278.02)    Pulmonary hypertension (HCC)    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 12/2014, Dr. Bufford Lope   S/P lumbar fusion 07/02/2013   SHOULDER PAIN 01/16/2009   Qualifier: Diagnosis of  By: Romeo Apple MD, Stanley     Shoulder pain, right    Sleep apnea    cpap   Sleep apnea    Spinal stenosis    Stroke [facility])    TENDINITIS, LEFT KNEE 01/16/2009   Qualifier: Diagnosis of  By: Romeo Apple MD, Rowe Nilson D deficiency    Past Surgical History:  Procedure Laterality Date   BACK SURGERY     CARDIAC CATHETERIZATION     gastric bypass surgery      KNEE ARTHROSCOPY     loop recorder  12/01/2018   Per [facility] Rep Chris-pt is unsafe to have any MRI-Loop recorder is end of life   10/01/2021   NASAL SEPTUM SURGERY     PROSTATE BIOPSY  2024   REVERSE SHOULDER ARTHROPLASTY Right 01/03/2022   Procedure: REVERSE SHOULDER ARTHROPLASTY;  Surgeon: Francena Hanly, MD;  Location: [facility];  Service: Orthopedics;  Laterality: Right;   Social History   Socioeconomic History   Marital status: Married     Spouse name: Inetta Fermo    Number of children: 1   Years of education: Masters   Highest education level: Not on file  Occupational History   Occupation: Retired-Drivers Ed   Tobacco Use   Smoking status: Never   Smokeless tobacco: Never  Vaping Use   Vaping status: Never Used  Substance and Sexual Activity   Alcohol use: Yes    Comment: occ   Drug use: No   Sexual activity: Yes    Birth control/protection: Surgical  Other Topics Concern   Not on file  Social History Narrative   Lives with wife-42 years   One daughter who lives in [place] grandchildren, one expecting      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, Research scientist (life sciences) racing, fishing-Cubs and Acupuncturist      Diet: eats all food groups   Caffeine: 2 cups of coffee daily, tea every other day   Water: 2-3 cups daily      Wears seat belt   Does not use phone while driving    Psychologist, sport and exercise             Social Drivers of Corporate investment banker Strain: Not on file  Food Insecurity: Not on file  Transportation Needs:  Not on file  Physical Activity: Not on file  Stress: Not on file  Social Connections: Not on file   Outpatient Encounter Medications as of 04/29/2023  Medication Sig   tirzepatide (MOUNJARO) 5 MG/0.5ML Pen Inject 5 mg into the skin once a week.   ascorbic acid (VITAMIN C) 500 MG tablet Take 500 mg by mouth daily.   atorvastatin (LIPITOR) 10 MG tablet TAKE (1) TABLET BY MOUTH AT BEDTIME.   Blood Glucose Monitoring Suppl (ACCU-CHEK GUIDE ME) w/Device KIT 1 Piece by Does not apply route as directed.   Calcium Citrate-Vitamin D (CALCIUM + D PO) Take 600 mg by mouth daily as needed. PRN   cetirizine (ZYRTEC) 10 MG tablet Take 10 mg by mouth daily as needed for allergies.    chlorthalidone (HYGROTON) 25 MG tablet Take 25 mg by mouth daily.   Cholecalciferol (VITAMIN D) 125 MCG (5000 UT) CAPS Take 5,000 Units by mouth daily.   clopidogrel (PLAVIX) 75 MG tablet Take 1 tablet (75 mg total) by mouth  daily.   colestipol (COLESTID) 1 g tablet Take 1 g by mouth 2 (two) times daily.   fluticasone (FLONASE) 50 MCG/ACT nasal spray Place 2 sprays into both nostrils daily as needed for allergies.   glucose blood (ACCU-CHEK GUIDE) test strip USE TO TEST BLOOD SUGAR FOUR TIMES DAILY. E11.65   lisinopril (PRINIVIL,ZESTRIL) 40 MG tablet Take 40 mg by mouth every morning.   metFORMIN (GLUCOPHAGE) 500 MG tablet Take 1 tablet (500 mg total) by mouth 2 (two) times daily with a meal.   Multiple Vitamins-Minerals (CENTRUM PO) Take 1 tablet by mouth every morning. [place]stone chewable with extra iron   potassium chloride SA (KLOR-CON M) 20 MEQ tablet TAKE (1) TABLET BY MOUTH ONCE DAILY.   sertraline (ZOLOFT) 50 MG tablet Take 1 tablet (50 mg total) by mouth daily. (Patient taking differently: Take 75 mg by mouth daily.)   zinc gluconate 50 MG tablet Take 50 mg by mouth daily.   [DISCONTINUED] tirzepatide [facility]) 2.5 MG/0.5ML Pen Inject 2.5 mg into the skin once a week.   No facility-administered encounter medications on file as of 04/29/2023.   ALLERGIES: Allergies  Allergen Reactions   Prednisone     Starts itching   VACCINATION STATUS: Immunization History  Administered Date(s) Administered   Influenza, High Dose Seasonal PF 11/29/2016, 01/20/2018   Influenza, Seasonal, Injecte, Preservative Fre 12/27/2012   Influenza,inj,Quad PF,6+ Mos 01/06/2015   Influenza-Unspecified 12/23/2013, 12/25/2015, 11/19/2018   PFIZER(Purple Top)SARS-COV-2 Vaccination 04/03/2019, 04/24/2019, 12/30/2019   Zoster, Live 04/25/2014    Diabetes He presents for his follow-up diabetic visit. He has type 2 diabetes mellitus. Onset time: He was diagnosed at approximate age of 39 years. His disease course has been improving. There are no hypoglycemic associated symptoms. Pertinent negatives for hypoglycemia include no confusion, headaches, pallor or seizures. Pertinent negatives for diabetes include no chest pain, no fatigue,  no polydipsia, no polyphagia, no polyuria and no weakness. There are no hypoglycemic complications. Symptoms are improving. Diabetic complications include a CVA and heart disease. Risk factors for coronary artery disease include diabetes mellitus, dyslipidemia, hypertension, male sex, obesity and sedentary lifestyle. Current diabetic treatment includes oral agent (monotherapy) (Trulicity 1.5 weekly). He is compliant with treatment all of the time. His weight is fluctuating minimally. He has had a previous visit with a dietitian. He participates in exercise intermittently. His home blood glucose trend is decreasing steadily. His breakfast blood glucose range is generally 130-140 mg/dl. His overall blood glucose range is  130-140 mg/dl. Zachary Murillo presents with improved glycemic profile, point-of-care A1c 6.8% improving from 7.3% during his last visit.       ) An ACE inhibitor/angiotensin II receptor blocker is being taken. He sees a podiatrist.Eye exam is current.  Hyperlipidemia This is a chronic problem. The current episode started more than 1 year ago. The problem is controlled. Recent lipid tests were reviewed and are normal. Exacerbating diseases include chronic renal disease, diabetes and obesity. Pertinent negatives include no chest pain, myalgias or shortness of breath. Current antihyperlipidemic treatment includes statins. The current treatment provides moderate improvement of lipids. Compliance problems include adherence to exercise.  Risk factors for coronary artery disease include diabetes mellitus, dyslipidemia, hypertension, male sex, a sedentary lifestyle, obesity and family history.  Hypertension This is a chronic problem. The current episode started more than 1 year ago. The problem has been gradually improving since onset. The problem is controlled. Pertinent negatives include no chest pain, headaches, neck pain, palpitations or shortness of breath. There are no associated agents to hypertension.  Risk factors for coronary artery disease include diabetes mellitus, dyslipidemia, male gender, obesity and sedentary lifestyle. Past treatments include ACE inhibitors, diuretics and calcium channel blockers. The current treatment provides moderate improvement. Compliance problems include exercise and diet.  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 Identifiable causes of hypertension include chronic renal disease and hyperaldosteronism.    Review of systems  Constitutional: + Lost 4 pounds since last visit, current Body mass index is 30.42 kg/m. , no fatigue, no subjective hyperthermia, no subjective hypothermia    Objective:    BP 120/74   Pulse 64   Ht 5\' 10"  (1.778 m)   Wt 212 lb (96.2 kg)   BMI 30.42 kg/m   Wt Readings from Last 3 Encounters:  04/29/23 212 lb (96.2 kg)  12/16/22 211 lb 14.4 oz (96.1 kg)  06/26/22 206 lb 12.8 oz (93.8 kg)    BP Readings from Last 3 Encounters:  04/29/23 120/74  12/16/22 126/68  09/23/22 137/71     Physical Exam- Limited  Constitutional:  Body mass index is 30.42 kg/m. , not in acute distress, normal state of mind   Results for orders placed or performed in visit on 04/29/23  HgB A1c   Collection Time: 04/29/23  9:28 AM  Result Value Ref Range   Hemoglobin A1C     HbA1c POC (<> result, manual entry)     HbA1c, POC (prediabetic range)     HbA1c, POC (controlled diabetic range) 6.8 0.0 - 7.0 %   Diabetic Labs (most recent): Lab Results  Component Value Date   HGBA1C 6.8 04/29/2023   HGBA1C 7.3 (A) 12/16/2022   HGBA1C 7.5 (A) 03/25/2022      Latest Ref Rng & Units 12/09/2022    9:16 AM 03/20/2022    8:18 AM 12/25/2021    9:53 AM  CMP  Glucose 70 - 99 mg/dL 409  811  914   BUN 8 - 27 mg/dL 24  18  20    Creatinine 0.76 - 1.27 mg/dL 7.82  9.56  2.13   Sodium 134 - 144 mmol/L 142  140  139   Potassium 3.5 - 5.2 mmol/L 3.9  3.5  3.8   Chloride 96 - 106 mmol/L 103  100  102   CO2 20 - 29 mmol/L 24  25  29    Calcium  8.6 - 10.2 mg/dL 9.2  9.1  9.4   Total Protein 6.0 - 8.5 g/dL 6.5  6.4    Total Bilirubin 0.0 - 1.2 mg/dL 0.9  0.7    Alkaline Phos 44 - 121 IU/L 137  169    AST 0 - 40 IU/L 23  23    ALT 0 - 44 IU/L 23  23     Lipid Panel     Component Value Date/Time   CHOL 107 12/09/2022 0916   TRIG 73 12/09/2022 0916   HDL 45 12/09/2022 0916   CHOLHDL 2.4 12/09/2022 0916   CHOLHDL 2.2 01/04/2016 0745   VLDL 13 01/04/2016 0745   LDLCALC 47 12/09/2022 0916   LABVLDL 15 12/09/2022 0916     Assessment & Plan:   1) Type 2 diabetes mellitus with vascular disease (HCC)  His diabetes is complicated by CVA and patient remains at a high risk for more acute and chronic complications of diabetes which include CAD, CVA, CKD, retinopathy, and neuropathy. He was diagnosed at approximate age of 69 years.  Zachary Murillo presents with improved glycemic profile, point-of-care A1c 6.8% improving from 7.3% during his last visit.    -He maintains 140 pounds of weight loss status post his Roux-en-Y bypass surgery.  He denies any hypoglycemia.    Recent labs reviewed. This patient continues to benefit from lifestyle medicine.   - he acknowledges that there is a room for improvement in his food and drink choices. - Suggestion is made for him to avoid simple carbohydrates  from his diet including Cakes, Sweet Desserts, Ice Cream, Soda (diet and regular), Sweet Tea, Candies, Chips, Cookies, Store Bought Juices, Alcohol , Artificial Sweeteners,  Coffee Creamer, and "Sugar-free" Products, Lemonade. This will help patient to have more stable blood glucose profile and potentially avoid unintended weight gain.   The following Lifestyle Medicine recommendations according to American College of Lifestyle Medicine  [facility]) were discussed and and offered to patient and he  agrees to start the journey:  A. Whole Foods, Plant-Based Nutrition comprising of fruits and vegetables, plant-based proteins, whole-grain carbohydrates was  discussed in detail with the patient.   A list for source of those nutrients were also provided to the patient.  Patient will use only water or unsweetened tea for hydration. B.  The need to stay away from risky substances including alcohol, smoking; obtaining 7 to 9 hours of restorative sleep, at least 150 minutes of moderate intensity exercise weekly, the importance of healthy social connections,  and stress management techniques were discussed. C.  A full color page of  Calorie density of various food groups per pound showing examples of each food groups was provided to the patient.  - Patient is advised to stick to a routine mealtimes to eat 3 meals  a day and avoid unnecessary snacks ( to snack only to correct hypoglycemia).  - I have approached patient with the following individualized plan to manage diabetes and patient agrees.  - After a successful Roux-en-Y bariatric surgery and lost approximately 145 pounds, he was able to come off of multiple daily injections of insulin therapy. His presentation is consistent with reasonable control of his glycemic profile utilizing Mounjaro.  He tolerated Mounjaro 2.5 mg, discussed and increased it to 5 mg subcutaneously weekly.    He is advised to continue metformin 500 mg p.o. twice daily.  He is approached to monitor blood glucose twice a day-daily before breakfast and at bedtime.  He is encouraged to call clinic for hypoglycemia under 70 or hyperglycemia above 200 mg/day   - Patient specific target  for  A1c; LDL, HDL, Triglycerides,  were discussed in detail.  2) BP/HTN:  -His blood pressure is controlled to target.  His current blood pressure medications include lisinopril 40 mg p.o. daily at breakfast..  3) Lipids/HPL:  His most recent lipid panel showed improved lipid profile with LDL at 47.  He is currently on Lipitor 10 mg p.o. nightly.  He is advised to continue.  Side effects and precautions discussed with him.   4)  Weight/Diet:  His  Body mass index is 30.42 kg/m. He is status post Roux-en-Y bariatric surgery.  He is still a candidate for modest weight loss.  The above detailed lifestyle medicine will help control weight, exercise, and carbohydrates information provided.  He is approached to reengage in routine physical activity to prevent further weight gain.  5) Chronic Care/Health Maintenance: -Patient is on ACEI/ARB and Statin medications and encouraged to continue to follow up with Ophthalmology, Podiatrist at least yearly or according to recommendations, and advised to  stay away from smoking. I have recommended yearly flu vaccine and pneumonia vaccination at least every 5 years; moderate intensity exercise for up to 150 minutes weekly; and  sleep for at least 7 hours a day.  6) Vitamin D deficiency -His most recent vitamin D level on 08/06/2018 was 51.  He is advised to continue daily maintenance dose of vitamin D3 for now.  Will recheck vitamin D level prior to next visit.   - I advised patient to maintain close follow up with his PCP and he is bariatric team for ongoing care needs.   I spent  26  minutes in the care of the patient today including review of labs from CMP, Lipids, Thyroid Function, Hematology (current and previous including abstractions from other facilities); face-to-face time discussing  his blood glucose readings/logs, discussing hypoglycemia and hyperglycemia episodes and symptoms, medications doses, his options of short and long term treatment based on the latest standards of care / guidelines;  discussion about incorporating lifestyle medicine;  and documenting the encounter. Risk reduction counseling performed per USPSTF guidelines to reduce  cardiovascular risk factors.     Please refer to Patient Instructions for Blood Glucose Monitoring and Insulin/Medications Dosing Guide"  in media tab for additional information. Please  also refer to " Patient Self Inventory" in the Media  tab for reviewed  elements of pertinent patient history.  Zachary Devoid. participated in the discussions, expressed understanding, and voiced agreement with the above plans.  All questions were answered to his satisfaction. he is encouraged to contact clinic should he have any questions or concerns prior to his return visit.  Follow up plan: -Return in about 4 months (around 08/27/2023) for F/U with Pre-visit Labs, Meter/CGM/Logs, A1c here.    Ronny Bacon, [facility] [address] [place], [place] [address] Phone: [phone] Fax: [phone]  04/29/2023, 4:05 PM

## 2023-04-29 NOTE — Patient Instructions (Signed)

## 2023-05-02 DIAGNOSIS — M545 Low back pain, unspecified: Secondary | ICD-10-CM | POA: Diagnosis not present

## 2023-05-05 DIAGNOSIS — M545 Low back pain, unspecified: Secondary | ICD-10-CM | POA: Diagnosis not present

## 2023-05-07 DIAGNOSIS — M545 Low back pain, unspecified: Secondary | ICD-10-CM | POA: Diagnosis not present

## 2023-05-12 DIAGNOSIS — M545 Low back pain, unspecified: Secondary | ICD-10-CM | POA: Diagnosis not present

## 2023-06-06 DIAGNOSIS — M545 Low back pain, unspecified: Secondary | ICD-10-CM | POA: Diagnosis not present

## 2023-06-10 DIAGNOSIS — M545 Low back pain, unspecified: Secondary | ICD-10-CM | POA: Diagnosis not present

## 2023-06-12 DIAGNOSIS — M545 Low back pain, unspecified: Secondary | ICD-10-CM | POA: Diagnosis not present

## 2023-06-16 DIAGNOSIS — M545 Low back pain, unspecified: Secondary | ICD-10-CM | POA: Diagnosis not present

## 2023-06-19 DIAGNOSIS — M545 Low back pain, unspecified: Secondary | ICD-10-CM | POA: Diagnosis not present

## 2023-07-02 DIAGNOSIS — M545 Low back pain, unspecified: Secondary | ICD-10-CM | POA: Diagnosis not present

## 2023-07-04 DIAGNOSIS — M545 Low back pain, unspecified: Secondary | ICD-10-CM | POA: Diagnosis not present

## 2023-07-08 DIAGNOSIS — M545 Low back pain, unspecified: Secondary | ICD-10-CM | POA: Diagnosis not present

## 2023-07-10 DIAGNOSIS — M545 Low back pain, unspecified: Secondary | ICD-10-CM | POA: Diagnosis not present

## 2023-07-14 DIAGNOSIS — E119 Type 2 diabetes mellitus without complications: Secondary | ICD-10-CM | POA: Diagnosis not present

## 2023-07-14 DIAGNOSIS — D3132 Benign neoplasm of left choroid: Secondary | ICD-10-CM | POA: Diagnosis not present

## 2023-07-14 DIAGNOSIS — H33322 Round hole, left eye: Secondary | ICD-10-CM | POA: Diagnosis not present

## 2023-07-14 DIAGNOSIS — H353131 Nonexudative age-related macular degeneration, bilateral, early dry stage: Secondary | ICD-10-CM | POA: Diagnosis not present

## 2023-07-17 DIAGNOSIS — E1159 Type 2 diabetes mellitus with other circulatory complications: Secondary | ICD-10-CM | POA: Diagnosis not present

## 2023-07-18 LAB — COMPREHENSIVE METABOLIC PANEL WITH GFR
ALT: 18 IU/L (ref 0–44)
AST: 18 IU/L (ref 0–40)
Albumin: 4.3 g/dL (ref 3.8–4.8)
Alkaline Phosphatase: 141 IU/L — ABNORMAL HIGH (ref 44–121)
BUN/Creatinine Ratio: 21 (ref 10–24)
BUN: 25 mg/dL (ref 8–27)
Bilirubin Total: 0.9 mg/dL (ref 0.0–1.2)
CO2: 24 mmol/L (ref 20–29)
Calcium: 9.1 mg/dL (ref 8.6–10.2)
Chloride: 102 mmol/L (ref 96–106)
Creatinine, Ser: 1.2 mg/dL (ref 0.76–1.27)
Globulin, Total: 2.1 g/dL (ref 1.5–4.5)
Glucose: 91 mg/dL (ref 70–99)
Potassium: 3.8 mmol/L (ref 3.5–5.2)
Sodium: 141 mmol/L (ref 134–144)
Total Protein: 6.4 g/dL (ref 6.0–8.5)
eGFR: 64 mL/min/{1.73_m2} (ref >59)

## 2023-07-18 LAB — VITAMIN D 25 HYDROXY (VIT D DEFICIENCY, FRACTURES): Vit D, 25-Hydroxy: 54.3 ng/mL (ref 30.0–100.0)

## 2023-07-18 LAB — VITAMIN B12: Vitamin B-12: 739 pg/mL (ref 232–1245)

## 2023-07-24 DIAGNOSIS — F411 Generalized anxiety disorder: Secondary | ICD-10-CM | POA: Diagnosis not present

## 2023-07-24 DIAGNOSIS — I158 Other secondary hypertension: Secondary | ICD-10-CM | POA: Diagnosis not present

## 2023-07-24 DIAGNOSIS — I151 Hypertension secondary to other renal disorders: Secondary | ICD-10-CM | POA: Diagnosis not present

## 2023-07-24 DIAGNOSIS — Z8673 Personal history of transient ischemic attack (TIA), and cerebral infarction without residual deficits: Secondary | ICD-10-CM | POA: Diagnosis not present

## 2023-07-24 DIAGNOSIS — E1169 Type 2 diabetes mellitus with other specified complication: Secondary | ICD-10-CM | POA: Diagnosis not present

## 2023-07-28 ENCOUNTER — Encounter: Payer: Self-pay | Admitting: "Endocrinology

## 2023-07-28 ENCOUNTER — Other Ambulatory Visit: Payer: Self-pay | Admitting: "Endocrinology

## 2023-07-28 ENCOUNTER — Ambulatory Visit: Payer: Medicare PPO | Admitting: "Endocrinology

## 2023-07-28 VITALS — BP 110/58 | HR 60 | Ht 70.0 in | Wt 210.6 lb

## 2023-07-28 DIAGNOSIS — Z7985 Long-term (current) use of injectable non-insulin antidiabetic drugs: Secondary | ICD-10-CM | POA: Diagnosis not present

## 2023-07-28 DIAGNOSIS — E782 Mixed hyperlipidemia: Secondary | ICD-10-CM | POA: Diagnosis not present

## 2023-07-28 DIAGNOSIS — I10 Essential (primary) hypertension: Secondary | ICD-10-CM

## 2023-07-28 DIAGNOSIS — E1159 Type 2 diabetes mellitus with other circulatory complications: Secondary | ICD-10-CM

## 2023-07-28 LAB — POCT GLYCOSYLATED HEMOGLOBIN (HGB A1C): HbA1c, POC (controlled diabetic range): 6.4 % (ref 0.0–7.0)

## 2023-07-28 MED ORDER — TIRZEPATIDE 7.5 MG/0.5ML ~~LOC~~ SOAJ: 7.5000 mg | SUBCUTANEOUS | 1 refills | Status: DC

## 2023-07-28 NOTE — Progress Notes (Signed)
 07/28/2023  Endocrinology follow-up note   Subjective:    Patient ID: Zachary Murillo., male    DOB: [birth year], PCP Karalee Oscar, PA   Past Medical History:  Diagnosis Date   Acute ischemic stroke (HCC) 02/22/2013   CVA (cerebral infarction) 01/22/2013   Diabetes mellitus (HCC)    Difficulty in walking(719.7) 01/25/2013   History of Roux-en-Y gastric bypass    Hypertension    IMPINGEMENT SYNDROME 01/16/2009   Qualifier: Diagnosis of  By: Phyllis Breeze MD, Arvel Lather     KNEE PAIN 01/16/2009   Qualifier: Diagnosis of  By: Phyllis Breeze MD, Stanley     Knee pain, right 09/05/2011   KNEE, ARTHRITIS, DEGEN./OSTEO 01/30/2009   Qualifier: Diagnosis of  By: Phyllis Breeze MD, Stanley     Lack of coordination 01/22/2013   Non-ischemic cardiomyopathy (HCC)    Osteoarthritis of knee    Other obesity due to excess calories 01/30/2015   Overweight(278.02)    Pulmonary hypertension (HCC)    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 12/2014, Dr. Calla Catchings   S/P lumbar fusion 07/02/2013   SHOULDER PAIN 01/16/2009   Qualifier: Diagnosis of  By: Phyllis Breeze MD, Stanley     Shoulder pain, right    Sleep apnea    cpap   Sleep apnea    Spinal stenosis    Stroke [facility])    TENDINITIS, LEFT KNEE 01/16/2009   Qualifier: Diagnosis of  By: Phyllis Breeze MD, Stanley     Vitamin D  deficiency    Past Surgical History:  Procedure Laterality Date   BACK SURGERY     CARDIAC CATHETERIZATION     gastric bypass surgery      KNEE ARTHROSCOPY     loop recorder  12/01/2018   Per [facility] Rep Chris-pt is unsafe to have any MRI-Loop recorder is end of life   10/01/2021   NASAL SEPTUM SURGERY     PROSTATE BIOPSY  2024   REVERSE SHOULDER ARTHROPLASTY Right 01/03/2022   Procedure: REVERSE SHOULDER ARTHROPLASTY;  Surgeon: Ellard Gunning, MD;  Location: [facility];  Service: Orthopedics;  Laterality: Right;   Social History   Socioeconomic History   Marital status: Married     Spouse name: Brian Campanile    Number of children: 1   Years of education: Masters   Highest education level: Not on file  Occupational History   Occupation: Retired-Drivers Ed   Tobacco Use   Smoking status: Never   Smokeless tobacco: Never  Vaping Use   Vaping status: Never Used  Substance and Sexual Activity   Alcohol use: Yes    Comment: occ   Drug use: No   Sexual activity: Yes    Birth control/protection: Surgical  Other Topics Concern   Not on file  Social History Narrative   Lives with wife-42 years   One daughter who lives in [place] grandchildren, one expecting      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, Research scientist (life sciences) racing, fishing-Cubs and Acupuncturist      Diet: eats all food groups   Caffeine: 2 cups of coffee daily, tea every other day   Water : 2-3 cups daily      Wears seat belt   Does not use phone while driving    Psychologist, sport and exercise             Social Drivers of Health   Financial Resource Strain: Not on file  Food Insecurity: Not on file  Transportation Needs:  Not on file  Physical Activity: Not on file  Stress: Not on file  Social Connections: Not on file   Outpatient Encounter Medications as of 07/28/2023  Medication Sig   tirzepatide  (MOUNJARO ) 7.5 MG/0.5ML Pen Inject 7.5 mg into the skin once a week.   ascorbic acid  (VITAMIN C ) 500 MG tablet Take 500 mg by mouth daily.   atorvastatin  (LIPITOR) 10 MG tablet TAKE (1) TABLET BY MOUTH AT BEDTIME.   Blood Glucose Monitoring Suppl (ACCU-CHEK GUIDE ME) w/Device KIT 1 Piece by Does not apply route as directed.   Calcium  Citrate-Vitamin D  (CALCIUM  + D PO) Take 600 mg by mouth daily as needed. PRN   cetirizine (ZYRTEC) 10 MG tablet Take 10 mg by mouth daily as needed for allergies.    chlorthalidone (HYGROTON) 25 MG tablet Take 25 mg by mouth daily.   Cholecalciferol (VITAMIN D ) 125 MCG (5000 UT) CAPS Take 5,000 Units by mouth daily.   clopidogrel  (PLAVIX ) 75 MG tablet Take 1 tablet (75 mg total) by  mouth daily.   colestipol (COLESTID) 1 g tablet Take 1 g by mouth 2 (two) times daily.   fluticasone (FLONASE) 50 MCG/ACT nasal spray Place 2 sprays into both nostrils daily as needed for allergies.   glucose blood (ACCU-CHEK GUIDE) test strip USE TO TEST BLOOD SUGAR FOUR TIMES DAILY. E11.65   lisinopril  (PRINIVIL ,ZESTRIL ) 40 MG tablet Take 40 mg by mouth every morning.   metFORMIN  (GLUCOPHAGE ) 500 MG tablet Take 1 tablet (500 mg total) by mouth 2 (two) times daily with a meal.   Multiple Vitamins-Minerals (CENTRUM PO) Take 1 tablet by mouth every morning. Flintstone chewable with extra iron   potassium chloride  SA (KLOR-CON  M) 20 MEQ tablet TAKE (1) TABLET BY MOUTH ONCE DAILY.   sertraline  (ZOLOFT ) 50 MG tablet Take 1 tablet (50 mg total) by mouth daily. (Patient taking differently: Take 75 mg by mouth daily.)   zinc gluconate 50 MG tablet Take 50 mg by mouth daily.   [DISCONTINUED] tirzepatide  (MOUNJARO ) 5 MG/0.5ML Pen Inject 5 mg into the skin once a week.   No facility-administered encounter medications on file as of 07/28/2023.   ALLERGIES: Allergies  Allergen Reactions   Prednisone     Starts itching   VACCINATION STATUS: Immunization History  Administered Date(s) Administered   Influenza, High Dose Seasonal PF 11/29/2016, 01/20/2018   Influenza, Seasonal, Injecte, Preservative Fre 12/27/2012   Influenza,inj,Quad PF,6+ Mos 01/06/2015   Influenza-Unspecified 12/23/2013, 12/25/2015, 11/19/2018   PFIZER(Purple Top)SARS-COV-2 Vaccination 04/03/2019, 04/24/2019, 12/30/2019   Zoster, Live 04/25/2014    Diabetes He presents for his follow-up diabetic visit. He has type 2 diabetes mellitus. Onset time: He was diagnosed at approximate age of 39 years. His disease course has been improving. There are no hypoglycemic associated symptoms. Pertinent negatives for hypoglycemia include no confusion, headaches, pallor or seizures. Pertinent negatives for diabetes include no chest pain, no  fatigue, no polydipsia, no polyphagia, no polyuria and no weakness. There are no hypoglycemic complications. Symptoms are improving. Diabetic complications include a CVA and heart disease. Risk factors for coronary artery disease include diabetes mellitus, dyslipidemia, hypertension, male sex, obesity and sedentary lifestyle. Current diabetic treatment includes oral agent (monotherapy) (Trulicity  1.5 weekly). He is compliant with treatment all of the time. His weight is decreasing steadily. He has had a previous visit with a dietitian. He participates in exercise intermittently. His home blood glucose trend is decreasing steadily. His breakfast blood glucose range is generally 110-130 mg/dl. His overall blood glucose range is  110-130 mg/dl. Debria Fang presents with improved glycemic profile and point-of-care A1c of 6.4%.  He did not report or document hypoglycemia.     ) An ACE inhibitor/angiotensin II receptor blocker is being taken. He sees a podiatrist.Eye exam is current.  Hyperlipidemia This is a chronic problem. The current episode started more than 1 year ago. The problem is controlled. Recent lipid tests were reviewed and are normal. Exacerbating diseases include chronic renal disease, diabetes and obesity. Pertinent negatives include no chest pain, myalgias or shortness of breath. Current antihyperlipidemic treatment includes statins. The current treatment provides moderate improvement of lipids. Compliance problems include adherence to exercise.  Risk factors for coronary artery disease include diabetes mellitus, dyslipidemia, hypertension, male sex, a sedentary lifestyle, obesity and family history.  Hypertension This is a chronic problem. The current episode started more than 1 year ago. The problem has been gradually improving since onset. The problem is controlled. Pertinent negatives include no chest pain, headaches, neck pain, palpitations or shortness of breath. There are no associated agents to  hypertension. Risk factors for coronary artery disease include diabetes mellitus, dyslipidemia, male gender, obesity and sedentary lifestyle. Past treatments include ACE inhibitors, diuretics and calcium  channel blockers. The current treatment provides moderate improvement. Compliance problems include exercise and diet.  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 Identifiable causes of hypertension include chronic renal disease and hyperaldosteronism.    Review of systems  Constitutional: + Lost 4 pounds since last visit, current Body mass index is 30.22 kg/m. , no fatigue, no subjective hyperthermia, no subjective hypothermia    Objective:    BP (!) 110/58   Pulse 60   Ht 5\' 10"  (1.778 m)   Wt 210 lb 9.6 oz (95.5 kg)   BMI 30.22 kg/m   Wt Readings from Last 3 Encounters:  07/28/23 210 lb 9.6 oz (95.5 kg)  04/29/23 212 lb (96.2 kg)  12/16/22 211 lb 14.4 oz (96.1 kg)    BP Readings from Last 3 Encounters:  07/28/23 (!) 110/58  04/29/23 120/74  12/16/22 126/68     Physical Exam- Limited  Constitutional:  Body mass index is 30.22 kg/m. , not in acute distress, normal state of mind   Results for orders placed or performed in visit on 07/28/23  HgB A1c   Collection Time: 07/28/23  1:49 PM  Result Value Ref Range   Hemoglobin A1C     HbA1c POC (<> result, manual entry)     HbA1c, POC (prediabetic range)     HbA1c, POC (controlled diabetic range) 6.4 0.0 - 7.0 %   Diabetic Labs (most recent): Lab Results  Component Value Date   HGBA1C 6.4 07/28/2023   HGBA1C 6.8 04/29/2023   HGBA1C 7.3 (A) 12/16/2022      Latest Ref Rng & Units 07/17/2023   10:22 AM 12/09/2022    9:16 AM 03/20/2022    8:18 AM  CMP  Glucose 70 - 99 mg/dL 91  960  454   BUN 8 - 27 mg/dL 25  24  18    Creatinine 0.76 - 1.27 mg/dL 0.98  1.19  1.47   Sodium 134 - 144 mmol/L 141  142  140   Potassium 3.5 - 5.2 mmol/L 3.8  3.9  3.5   Chloride 96 - 106 mmol/L 102  103  100   CO2 20 - 29 mmol/L  24  24  25    Calcium  8.6 - 10.2 mg/dL 9.1  9.2  9.1   Total Protein 6.0 - 8.5  g/dL 6.4  6.5  6.4   Total Bilirubin 0.0 - 1.2 mg/dL 0.9  0.9  0.7   Alkaline Phos 44 - 121 IU/L 141  137  169   AST 0 - 40 IU/L 18  23  23    ALT 0 - 44 IU/L 18  23  23     Lipid Panel     Component Value Date/Time   CHOL 107 12/09/2022 0916   TRIG 73 12/09/2022 0916   HDL 45 12/09/2022 0916   CHOLHDL 2.4 12/09/2022 0916   CHOLHDL 2.2 01/04/2016 0745   VLDL 13 01/04/2016 0745   LDLCALC 47 12/09/2022 0916   LABVLDL 15 12/09/2022 0916     Assessment & Plan:   1) Type 2 diabetes mellitus with vascular disease (HCC)  His diabetes is complicated by CVA and patient remains at a high risk for more acute and chronic complications of diabetes which include CAD, CVA, CKD, retinopathy, and neuropathy. He was diagnosed at approximate age of 89 years.  Debria Fang presents with improved glycemic profile and point-of-care A1c of 6.4%.  He did not report or document hypoglycemia.  -He maintains 140 pounds of weight loss status post his Roux-en-Y bypass surgery.  He denies any hypoglycemia.    Recent labs reviewed. This patient continues to benefit from lifestyle medicine.   - he acknowledges that there is a room for improvement in his food and drink choices. - Suggestion is made for him to avoid simple carbohydrates  from his diet including Cakes, Sweet Desserts, Ice Cream, Soda (diet and regular), Sweet Tea, Candies, Chips, Cookies, Store Bought Juices, Alcohol , Artificial Sweeteners,  Coffee Creamer, and "Sugar-free" Products, Lemonade. This will help patient to have more stable blood glucose profile and potentially avoid unintended weight gain.  The following Lifestyle Medicine recommendations according to American College of Lifestyle Medicine  [facility]) were discussed and and offered to patient and he  agrees to start the journey:  A. Whole Foods, Plant-Based Nutrition comprising of fruits and vegetables, plant-based  proteins, whole-grain carbohydrates was discussed in detail with the patient.   A list for source of those nutrients were also provided to the patient.  Patient will use only water  or unsweetened tea for hydration. B.  The need to stay away from risky substances including alcohol, smoking; obtaining 7 to 9 hours of restorative sleep, at least 150 minutes of moderate intensity exercise weekly, the importance of healthy social connections,  and stress management techniques were discussed. C.  A full color page of  Calorie density of various food groups per pound showing examples of each food groups was provided to the patient.   - Patient is advised to stick to a routine mealtimes to eat 3 meals  a day and avoid unnecessary snacks ( to snack only to correct hypoglycemia).  - I have approached patient with the following individualized plan to manage diabetes and patient agrees.  - After a successful Roux-en-Y bariatric surgery and lost approximately 145 pounds, he was able to come off of multiple daily injections of insulin  therapy. His presentation is consistent with controlled glycemic profile.  He is advised to continue metformin  500 mg p.o. twice daily.  He would benefit from a higher dose of Mounjaro  in terms of his weight management.  I discussed and increase his Mounjaro  to 7.5 mg subcutaneously weekly.    He is encouraged to call clinic for hypoglycemia under 70 or hyperglycemia above 200 mg/day   - Patient specific target  for A1c; LDL, HDL, Triglycerides,  were discussed in detail.  2) BP/HTN:  His blood pressure is controlled to target.  His current blood pressure medications include lisinopril  40 mg p.o. daily at breakfast..  3) Lipids/HPL:  His most recent lipid panel showed improved lipid profile with LDL at 47.  He is currently on Lipitor 10 mg p.o. nightly.  He is advised to continue.   Side effects and precautions discussed with him.   4)  Weight/Diet:  His Body mass index is  30.22 kg/m. He is status post Roux-en-Y bariatric surgery.  He is still a candidate for modest weight loss.  The above detailed lifestyle medicine will help control weight, exercise, and carbohydrates information provided.  He is approached to reengage in routine physical activity to prevent further weight gain.  5) Chronic Care/Health Maintenance: -Patient is on ACEI/ARB and Statin medications and encouraged to continue to follow up with Ophthalmology, Podiatrist at least yearly or according to recommendations, and advised to  stay away from smoking. I have recommended yearly flu vaccine and pneumonia vaccination at least every 5 years; moderate intensity exercise for up to 150 minutes weekly; and  sleep for at least 7 hours a day.  6) Vitamin D  deficiency -His most recent vitamin D  level on 08/06/2018 was 51.  He is advised to continue daily maintenance dose of vitamin D3 for now.  Will recheck vitamin D  level prior to next visit.   - I advised patient to maintain close follow up with his PCP and he is bariatric team for ongoing care needs.   I spent  26  minutes in the care of the patient today including review of labs from CMP, Lipids, Thyroid  Function, Hematology (current and previous including abstractions from other facilities); face-to-face time discussing  his blood glucose readings/logs, discussing hypoglycemia and hyperglycemia episodes and symptoms, medications doses, his options of short and long term treatment based on the latest standards of care / guidelines;  discussion about incorporating lifestyle medicine;  and documenting the encounter. Risk reduction counseling performed per USPSTF guidelines to reduce  obesity and cardiovascular risk factors.     Please refer to Patient Instructions for Blood Glucose Monitoring and Insulin /Medications Dosing Guide"  in media tab for additional information. Please  also refer to " Patient Self Inventory" in the Media  tab for reviewed elements of  pertinent patient history.  Zachary Murillo. participated in the discussions, expressed understanding, and voiced agreement with the above plans.  All questions were answered to his satisfaction. he is encouraged to contact clinic should he have any questions or concerns prior to his return visit.   Follow up plan: -Return in about 6 months (around 01/28/2024) for Bring Meter/CGM Device/Logs- A1c in Office.    Hulon Magic, [facility] [address] [place], [place] [address] Phone: [phone] Fax: [phone]  07/28/2023, 4:24 PM

## 2023-07-28 NOTE — Patient Instructions (Signed)

## 2023-10-13 DIAGNOSIS — F419 Anxiety disorder, unspecified: Secondary | ICD-10-CM | POA: Diagnosis not present

## 2023-10-13 DIAGNOSIS — Z8673 Personal history of transient ischemic attack (TIA), and cerebral infarction without residual deficits: Secondary | ICD-10-CM | POA: Diagnosis not present

## 2023-10-13 DIAGNOSIS — E1169 Type 2 diabetes mellitus with other specified complication: Secondary | ICD-10-CM | POA: Diagnosis not present

## 2023-10-13 DIAGNOSIS — R5383 Other fatigue: Secondary | ICD-10-CM | POA: Diagnosis not present

## 2023-10-13 DIAGNOSIS — I151 Hypertension secondary to other renal disorders: Secondary | ICD-10-CM | POA: Diagnosis not present

## 2023-10-13 DIAGNOSIS — I158 Other secondary hypertension: Secondary | ICD-10-CM | POA: Diagnosis not present

## 2023-10-14 DIAGNOSIS — I10 Essential (primary) hypertension: Secondary | ICD-10-CM | POA: Diagnosis not present

## 2023-10-14 DIAGNOSIS — I152 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: Secondary | ICD-10-CM | POA: Diagnosis not present

## 2023-10-14 DIAGNOSIS — I5042 Chronic combined systolic (congestive) and diastolic (congestive) heart failure: Secondary | ICD-10-CM | POA: Diagnosis not present

## 2023-10-15 DIAGNOSIS — C61 Malignant neoplasm of prostate: Secondary | ICD-10-CM | POA: Diagnosis not present

## 2023-10-27 ENCOUNTER — Other Ambulatory Visit: Payer: Self-pay

## 2023-10-27 DIAGNOSIS — E1159 Type 2 diabetes mellitus with other circulatory complications: Secondary | ICD-10-CM

## 2023-10-27 MED ORDER — METFORMIN HCL 500 MG PO TABS: 500.0000 mg | ORAL_TABLET | Freq: Two times a day (BID) | ORAL | 0 refills | Status: DC

## 2023-11-03 DIAGNOSIS — C61 Malignant neoplasm of prostate: Secondary | ICD-10-CM | POA: Diagnosis not present

## 2023-11-03 DIAGNOSIS — N400 Enlarged prostate without lower urinary tract symptoms: Secondary | ICD-10-CM | POA: Diagnosis not present

## 2023-11-25 DIAGNOSIS — M545 Low back pain, unspecified: Secondary | ICD-10-CM | POA: Diagnosis not present

## 2023-11-28 DIAGNOSIS — M545 Low back pain, unspecified: Secondary | ICD-10-CM | POA: Diagnosis not present

## 2023-12-02 DIAGNOSIS — M545 Low back pain, unspecified: Secondary | ICD-10-CM | POA: Diagnosis not present

## 2023-12-05 DIAGNOSIS — M545 Low back pain, unspecified: Secondary | ICD-10-CM | POA: Diagnosis not present

## 2023-12-09 DIAGNOSIS — M545 Low back pain, unspecified: Secondary | ICD-10-CM | POA: Diagnosis not present

## 2023-12-16 DIAGNOSIS — D487 Neoplasm of uncertain behavior of other specified sites: Secondary | ICD-10-CM | POA: Diagnosis not present

## 2023-12-16 DIAGNOSIS — D3132 Benign neoplasm of left choroid: Secondary | ICD-10-CM | POA: Diagnosis not present

## 2023-12-17 DIAGNOSIS — M545 Low back pain, unspecified: Secondary | ICD-10-CM | POA: Diagnosis not present

## 2023-12-25 DIAGNOSIS — M545 Low back pain, unspecified: Secondary | ICD-10-CM | POA: Diagnosis not present

## 2023-12-26 DIAGNOSIS — M545 Low back pain, unspecified: Secondary | ICD-10-CM | POA: Diagnosis not present

## 2023-12-29 DIAGNOSIS — M25511 Pain in right shoulder: Secondary | ICD-10-CM | POA: Diagnosis not present

## 2024-01-01 DIAGNOSIS — M545 Low back pain, unspecified: Secondary | ICD-10-CM | POA: Diagnosis not present

## 2024-01-07 DIAGNOSIS — M545 Low back pain, unspecified: Secondary | ICD-10-CM | POA: Diagnosis not present

## 2024-01-14 ENCOUNTER — Other Ambulatory Visit: Payer: Self-pay

## 2024-01-14 DIAGNOSIS — E119 Type 2 diabetes mellitus without complications: Secondary | ICD-10-CM | POA: Diagnosis not present

## 2024-01-14 DIAGNOSIS — E2609 Other primary hyperaldosteronism: Secondary | ICD-10-CM | POA: Diagnosis not present

## 2024-01-14 DIAGNOSIS — E1159 Type 2 diabetes mellitus with other circulatory complications: Secondary | ICD-10-CM

## 2024-01-14 DIAGNOSIS — I152 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: Secondary | ICD-10-CM | POA: Diagnosis not present

## 2024-01-14 MED ORDER — TIRZEPATIDE 7.5 MG/0.5ML ~~LOC~~ SOAJ: 7.5000 mg | SUBCUTANEOUS | 0 refills | Status: DC

## 2024-01-15 DIAGNOSIS — M545 Low back pain, unspecified: Secondary | ICD-10-CM | POA: Diagnosis not present

## 2024-01-22 DIAGNOSIS — M545 Low back pain, unspecified: Secondary | ICD-10-CM | POA: Diagnosis not present

## 2024-01-28 ENCOUNTER — Other Ambulatory Visit: Payer: Self-pay

## 2024-01-28 DIAGNOSIS — E1159 Type 2 diabetes mellitus with other circulatory complications: Secondary | ICD-10-CM

## 2024-01-28 MED ORDER — METFORMIN HCL 500 MG PO TABS: 500.0000 mg | ORAL_TABLET | Freq: Two times a day (BID) | ORAL | 0 refills | Status: DC

## 2024-01-29 DIAGNOSIS — N2889 Other specified disorders of kidney and ureter: Secondary | ICD-10-CM | POA: Diagnosis not present

## 2024-01-29 DIAGNOSIS — Z85820 Personal history of malignant melanoma of skin: Secondary | ICD-10-CM | POA: Diagnosis not present

## 2024-01-29 DIAGNOSIS — M545 Low back pain, unspecified: Secondary | ICD-10-CM | POA: Diagnosis not present

## 2024-01-29 DIAGNOSIS — I158 Other secondary hypertension: Secondary | ICD-10-CM | POA: Diagnosis not present

## 2024-01-29 DIAGNOSIS — Z0000 Encounter for general adult medical examination without abnormal findings: Secondary | ICD-10-CM | POA: Diagnosis not present

## 2024-01-29 DIAGNOSIS — Z8673 Personal history of transient ischemic attack (TIA), and cerebral infarction without residual deficits: Secondary | ICD-10-CM | POA: Diagnosis not present

## 2024-01-29 DIAGNOSIS — F419 Anxiety disorder, unspecified: Secondary | ICD-10-CM | POA: Diagnosis not present

## 2024-01-29 DIAGNOSIS — G8929 Other chronic pain: Secondary | ICD-10-CM | POA: Diagnosis not present

## 2024-01-29 DIAGNOSIS — E1169 Type 2 diabetes mellitus with other specified complication: Secondary | ICD-10-CM | POA: Diagnosis not present

## 2024-01-29 DIAGNOSIS — I151 Hypertension secondary to other renal disorders: Secondary | ICD-10-CM | POA: Diagnosis not present

## 2024-01-30 ENCOUNTER — Encounter: Payer: Self-pay | Admitting: "Endocrinology

## 2024-01-30 ENCOUNTER — Ambulatory Visit: Admitting: "Endocrinology

## 2024-01-30 VITALS — BP 116/78 | HR 64 | Ht 70.0 in | Wt 199.6 lb

## 2024-01-30 DIAGNOSIS — Z7985 Long-term (current) use of injectable non-insulin antidiabetic drugs: Secondary | ICD-10-CM | POA: Diagnosis not present

## 2024-01-30 DIAGNOSIS — E782 Mixed hyperlipidemia: Secondary | ICD-10-CM | POA: Diagnosis not present

## 2024-01-30 DIAGNOSIS — E1159 Type 2 diabetes mellitus with other circulatory complications: Secondary | ICD-10-CM

## 2024-01-30 DIAGNOSIS — I10 Essential (primary) hypertension: Secondary | ICD-10-CM | POA: Diagnosis not present

## 2024-01-30 MED ORDER — TIRZEPATIDE 10 MG/0.5ML ~~LOC~~ SOAJ: 10.0000 mg | SUBCUTANEOUS | 1 refills | Status: AC

## 2024-01-30 NOTE — Progress Notes (Signed)
 01/30/2024  Endocrinology follow-up note   Subjective:    Patient ID: Zachary JAYSON Joshua Mickey., male    DOB: [birth year], PCP Alys Schuyler HERO, PA   Past Medical History:  Diagnosis Date   Acute ischemic stroke (HCC) 02/22/2013   CVA (cerebral infarction) 01/22/2013   Diabetes mellitus (HCC)    Difficulty in walking(719.7) 01/25/2013   History of Roux-en-Y gastric bypass    Hypertension    IMPINGEMENT SYNDROME 01/16/2009   Qualifier: Diagnosis of  By: Margrette MD, Taft     KNEE PAIN 01/16/2009   Qualifier: Diagnosis of  By: Margrette MD, Stanley     Knee pain, right 09/05/2011   KNEE, ARTHRITIS, DEGEN./OSTEO 01/30/2009   Qualifier: Diagnosis of  By: Margrette MD, Stanley     Lack of coordination 01/22/2013   Non-ischemic cardiomyopathy (HCC)    Osteoarthritis of knee    Other obesity due to excess calories 01/30/2015   Overweight(278.02)    Pulmonary hypertension (HCC)    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 12/2014, Dr. Tomi   S/P lumbar fusion 07/02/2013   SHOULDER PAIN 01/16/2009   Qualifier: Diagnosis of  By: Margrette MD, Stanley     Shoulder pain, right    Sleep apnea    cpap   Sleep apnea    Spinal stenosis    Stroke [facility])    TENDINITIS, LEFT KNEE 01/16/2009   Qualifier: Diagnosis of  By: Margrette MD, Stanley     Vitamin D  deficiency    Past Surgical History:  Procedure Laterality Date   BACK SURGERY     CARDIAC CATHETERIZATION     gastric bypass surgery      KNEE ARTHROSCOPY     loop recorder  12/01/2018   Per [facility] Rep Chris-pt is unsafe to have any MRI-Loop recorder is end of life   10/01/2021   NASAL SEPTUM SURGERY     PROSTATE BIOPSY  2024   REVERSE SHOULDER ARTHROPLASTY Right 01/03/2022   Procedure: REVERSE SHOULDER ARTHROPLASTY;  Surgeon: Melita Drivers, MD;  Location: [facility];  Service: Orthopedics;  Laterality: Right;   Social History   Socioeconomic History   Marital status:  Married    Spouse name: Ellouise    Number of children: 1   Years of education: Masters   Highest education level: Not on file  Occupational History   Occupation: Retired-Drivers Ed   Tobacco Use   Smoking status: Never   Smokeless tobacco: Never  Vaping Use   Vaping status: Never Used  Substance and Sexual Activity   Alcohol use: Yes    Comment: occ   Drug use: No   Sexual activity: Yes    Birth control/protection: Surgical  Other Topics Concern   Not on file  Social History Narrative   Lives with wife-42 years   One daughter who lives in [place] grandchildren, one expecting      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, research scientist (life sciences) racing, fishing-Cubs and Acupuncturist      Diet: eats all food groups   Caffeine: 2 cups of coffee daily, tea every other day   Water : 2-3 cups daily      Wears seat belt   Does not use phone while driving    Psychologist, sport and exercise             Social Drivers of Health   Financial Resource Strain: Not on file  Food Insecurity: Not on file  Transportation Needs:  Not on file  Physical Activity: Not on file  Stress: Not on file  Social Connections: Not on file   Outpatient Encounter Medications as of 01/30/2024  Medication Sig   tirzepatide  (MOUNJARO ) 10 MG/0.5ML Pen Inject 10 mg into the skin once a week.   ascorbic acid  (VITAMIN C ) 500 MG tablet Take 500 mg by mouth daily.   atorvastatin  (LIPITOR) 10 MG tablet TAKE (1) TABLET BY MOUTH AT BEDTIME.   Blood Glucose Monitoring Suppl (ACCU-CHEK GUIDE ME) w/Device KIT 1 Piece by Does not apply route as directed.   Calcium  Citrate-Vitamin D  (CALCIUM  + D PO) Take 600 mg by mouth daily as needed. PRN   cetirizine (ZYRTEC) 10 MG tablet Take 10 mg by mouth daily as needed for allergies.    chlorthalidone (HYGROTON) 25 MG tablet Take 25 mg by mouth daily.   Cholecalciferol (VITAMIN D ) 125 MCG (5000 UT) CAPS Take 5,000 Units by mouth daily.   clopidogrel  (PLAVIX ) 75 MG tablet Take 1 tablet (75 mg total)  by mouth daily.   colestipol (COLESTID) 1 g tablet Take 1 g by mouth 2 (two) times daily.   fluticasone (FLONASE) 50 MCG/ACT nasal spray Place 2 sprays into both nostrils daily as needed for allergies.   glucose blood (ACCU-CHEK GUIDE) test strip USE TO TEST BLOOD SUGAR FOUR TIMES DAILY. E11.65   lisinopril  (PRINIVIL ,ZESTRIL ) 40 MG tablet Take 40 mg by mouth every morning.   Multiple Vitamins-Minerals (CENTRUM PO) Take 1 tablet by mouth every morning. Flintstone chewable with extra iron   potassium chloride  SA (KLOR-CON  M) 20 MEQ tablet TAKE (1) TABLET BY MOUTH ONCE DAILY.   sertraline  (ZOLOFT ) 50 MG tablet Take 1 tablet (50 mg total) by mouth daily. (Patient taking differently: Take 75 mg by mouth daily.)   zinc gluconate 50 MG tablet Take 50 mg by mouth daily.   [DISCONTINUED] metFORMIN  (GLUCOPHAGE ) 500 MG tablet Take 1 tablet (500 mg total) by mouth 2 (two) times daily with a meal.   [DISCONTINUED] tirzepatide  (MOUNJARO ) 7.5 MG/0.5ML Pen Inject 7.5 mg into the skin once a week.   No facility-administered encounter medications on file as of 01/30/2024.   ALLERGIES: Allergies  Allergen Reactions   Prednisone     Starts itching   VACCINATION STATUS: Immunization History  Administered Date(s) Administered   INFLUENZA, HIGH DOSE SEASONAL PF 11/29/2016, 01/20/2018   Influenza, Seasonal, Injecte, Preservative Fre 12/27/2012   Influenza,inj,Quad PF,6+ Mos 01/06/2015   Influenza-Unspecified 12/23/2013, 12/25/2015, 11/19/2018   PFIZER(Purple Top)SARS-COV-2 Vaccination 04/03/2019, 04/24/2019, 12/30/2019   Zoster, Live 04/25/2014    Diabetes He presents for his follow-up diabetic visit. He has type 2 diabetes mellitus. Onset time: He was diagnosed at approximate age of 39 years. His disease course has been stable. There are no hypoglycemic associated symptoms. Pertinent negatives for hypoglycemia include no confusion, headaches, pallor or seizures. Pertinent negatives for diabetes include no  chest pain, no fatigue, no polydipsia, no polyphagia, no polyuria and no weakness. There are no hypoglycemic complications. Symptoms are stable. Diabetic complications include a CVA and heart disease. Risk factors for coronary artery disease include diabetes mellitus, dyslipidemia, hypertension, male sex, obesity and sedentary lifestyle. Current diabetic treatment includes oral agent (monotherapy) (Trulicity  1.5 weekly). He is compliant with treatment all of the time. His weight is decreasing steadily. He has had a previous visit with a dietitian. He participates in exercise intermittently. His home blood glucose trend is increasing steadily. His overall blood glucose range is 110-130 mg/dl. Rodolfo presents with stable point-of-care A1c  of 6.4%.  He did not document any hypoglycemia.     ) An ACE inhibitor/angiotensin II receptor blocker is being taken. He sees a podiatrist.Eye exam is current.  Hyperlipidemia This is a chronic problem. The current episode started more than 1 year ago. The problem is controlled. Recent lipid tests were reviewed and are normal. Exacerbating diseases include chronic renal disease, diabetes and obesity. Pertinent negatives include no chest pain, myalgias or shortness of breath. Current antihyperlipidemic treatment includes statins. The current treatment provides moderate improvement of lipids. Compliance problems include adherence to exercise.  Risk factors for coronary artery disease include diabetes mellitus, dyslipidemia, hypertension, male sex, a sedentary lifestyle, obesity and family history.  Hypertension This is a chronic problem. The current episode started more than 1 year ago. The problem has been gradually improving since onset. The problem is controlled. Pertinent negatives include no chest pain, headaches, neck pain, palpitations or shortness of breath. There are no associated agents to hypertension. Risk factors for coronary artery disease include diabetes mellitus,  dyslipidemia, male gender, obesity and sedentary lifestyle. Past treatments include ACE inhibitors, diuretics and calcium  channel blockers. The current treatment provides moderate improvement. Compliance problems include exercise and diet.  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 Identifiable causes of hypertension include chronic renal disease and hyperaldosteronism.    Review of systems  Constitutional: + Lost 4 pounds since last visit, current Body mass index is 28.64 kg/m. , no fatigue, no subjective hyperthermia, no subjective hypothermia    Objective:    BP 116/78   Pulse 64   Ht 5' 10 (1.778 m)   Wt 199 lb 9.6 oz (90.5 kg)   BMI 28.64 kg/m   Wt Readings from Last 3 Encounters:  01/30/24 199 lb 9.6 oz (90.5 kg)  07/28/23 210 lb 9.6 oz (95.5 kg)  04/29/23 212 lb (96.2 kg)    BP Readings from Last 3 Encounters:  01/30/24 116/78  07/28/23 (!) 110/58  04/29/23 120/74     Physical Exam- Limited  Constitutional:  Body mass index is 28.64 kg/m. , not in acute distress, normal state of mind   Results for orders placed or performed in visit on 07/28/23  HgB A1c   Collection Time: 07/28/23  1:49 PM  Result Value Ref Range   Hemoglobin A1C     HbA1c POC (<> result, manual entry)     HbA1c, POC (prediabetic range)     HbA1c, POC (controlled diabetic range) 6.4 0.0 - 7.0 %   Diabetic Labs (most recent): Lab Results  Component Value Date   HGBA1C 6.4 07/28/2023   HGBA1C 6.8 04/29/2023   HGBA1C 7.3 (A) 12/16/2022      Latest Ref Rng & Units 07/17/2023   10:22 AM 12/09/2022    9:16 AM 03/20/2022    8:18 AM  CMP  Glucose 70 - 99 mg/dL 91  866  860   BUN 8 - 27 mg/dL 25  24  18    Creatinine 0.76 - 1.27 mg/dL 8.79  8.91  9.07   Sodium 134 - 144 mmol/L 141  142  140   Potassium 3.5 - 5.2 mmol/L 3.8  3.9  3.5   Chloride 96 - 106 mmol/L 102  103  100   CO2 20 - 29 mmol/L 24  24  25    Calcium  8.6 - 10.2 mg/dL 9.1  9.2  9.1   Total Protein 6.0 - 8.5 g/dL  6.4  6.5  6.4   Total Bilirubin 0.0 - 1.2  mg/dL 0.9  0.9  0.7   Alkaline Phos 44 - 121 IU/L 141  137  169   AST 0 - 40 IU/L 18  23  23    ALT 0 - 44 IU/L 18  23  23     Lipid Panel     Component Value Date/Time   CHOL 107 12/09/2022 0916   TRIG 73 12/09/2022 0916   HDL 45 12/09/2022 0916   CHOLHDL 2.4 12/09/2022 0916   CHOLHDL 2.2 01/04/2016 0745   VLDL 13 01/04/2016 0745   LDLCALC 47 12/09/2022 0916   LABVLDL 15 12/09/2022 0916     Assessment & Plan:   1) Type 2 diabetes mellitus with vascular disease (HCC)  His diabetes is complicated by CVA and patient remains at a high risk for more acute and chronic complications of diabetes which include CAD, CVA, CKD, retinopathy, and neuropathy. He was diagnosed at approximate age of 36 years.  Rosalynn presents with stable point-of-care A1c of 6.4%.  He did not document any hypoglycemia.    -He maintains 140 pounds of weight loss status post his Roux-en-Y bypass surgery.  He denies any hypoglycemia.   - he acknowledges that there is a room for improvement in his food and drink choices. - Suggestion is made for him to avoid simple carbohydrates  from his diet including Cakes, Sweet Desserts, Ice Cream, Soda (diet and regular), Sweet Tea, Candies, Chips, Cookies, Store Bought Juices, Alcohol , Artificial Sweeteners,  Coffee Creamer, and Sugar-free Products, Lemonade. This will help patient to have more stable blood glucose profile and potentially avoid unintended weight gain.  - Patient is advised to stick to a routine mealtimes to eat 3 meals  a day and avoid unnecessary snacks ( to snack only to correct hypoglycemia).  - I have approached patient with the following individualized plan to manage diabetes and patient agrees.  - After a successful Roux-en-Y bariatric surgery and lost approximately 155 pounds, he was able to come off of multiple daily injections of insulin  therapy. His presentation is consistent with controlled glycemic  profile.    He would like to achieve some more weight loss.  I discussed to increase his Mounjaro  to 10 mg subcutaneously weekly.  At this point, he will be taken off of metformin .    He is encouraged to call clinic for hypoglycemia under 70 or hyperglycemia above 200 mg/day   - Patient specific target  for A1c; LDL, HDL, Triglycerides,  were discussed in detail.  2) BP/HTN:  His blood pressure is controlled to target.  His current blood pressure medications include lisinopril  40 mg p.o. daily at breakfast..  3) Lipids/HPL:  His most recent lipid panel showed improved lipid profile with LDL at 47.  He is currently on Lipitor 10 mg p.o. nightly.  He is advised to continue.   Side effects and precautions discussed with him.   4)  Weight/Diet:  His Body mass index is 28.64 kg/m. He is status post Roux-en-Y bariatric surgery.  He is still a candidate for modest weight loss.  The above detailed lifestyle medicine will help control weight, exercise, and carbohydrates information provided.  He is approached to reengage in routine physical activity to prevent further weight gain.  5) Chronic Care/Health Maintenance: -Patient is on ACEI/ARB and Statin medications and encouraged to continue to follow up with Ophthalmology, Podiatrist at least yearly or according to recommendations, and advised to  stay away from smoking. I have recommended yearly flu vaccine and pneumonia vaccination at  least every 5 years; moderate intensity exercise for up to 150 minutes weekly; and  sleep for at least 7 hours a day.  6) Vitamin D  deficiency -His most recent vitamin D  level on 08/06/2018 was 51.  He is advised to continue daily maintenance dose of vitamin D3 for now.  Will recheck vitamin D  level prior to next visit.   - I advised patient to maintain close follow up with his PCP and he is bariatric team for ongoing care needs.   I spent  26  minutes in the care of the patient today including review of labs from  CMP, Lipids, Thyroid  Function, Hematology (current and previous including abstractions from other facilities); face-to-face time discussing  his blood glucose readings/logs, discussing hypoglycemia and hyperglycemia episodes and symptoms, medications doses, his options of short and long term treatment based on the latest standards of care / guidelines;  discussion about incorporating lifestyle medicine;  and documenting the encounter. Risk reduction counseling performed per USPSTF guidelines to reduce cardiovascular risk factors.     Please refer to Patient Instructions for Blood Glucose Monitoring and Insulin /Medications Dosing Guide  in media tab for additional information. Please  also refer to  Patient Self Inventory in the Media  tab for reviewed elements of pertinent patient history.  Zachary JAYSON Joshua Mickey. participated in the discussions, expressed understanding, and voiced agreement with the above plans.  All questions were answered to his satisfaction. he is encouraged to contact clinic should he have any questions or concerns prior to his return visit.   Follow up plan: -Return in about 6 months (around 07/29/2024) for F/U with Pre-visit Labs, Meter/CGM/Logs, A1c here.    Benton Rio, [facility] [address] [place], [place] [address] Phone: [phone] Fax: [phone]  01/30/2024, 8:42 AM

## 2024-02-04 DIAGNOSIS — M545 Low back pain, unspecified: Secondary | ICD-10-CM | POA: Diagnosis not present

## 2024-02-11 DIAGNOSIS — M545 Low back pain, unspecified: Secondary | ICD-10-CM | POA: Diagnosis not present

## 2024-07-05 ENCOUNTER — Ambulatory Visit: Admitting: "Endocrinology
